# Patient Record
Sex: Female | Born: 1940 | Race: Black or African American | Hispanic: No | Marital: Married | Smoking: Never smoker
Health system: Southern US, Community
[De-identification: ages and names within clinical notes are randomized; demographics above are authoritative.]

## PROBLEM LIST (undated history)

## (undated) DIAGNOSIS — T4145XA Adverse effect of unspecified anesthetic, initial encounter: Secondary | ICD-10-CM

## (undated) DIAGNOSIS — K589 Irritable bowel syndrome without diarrhea: Secondary | ICD-10-CM

## (undated) DIAGNOSIS — Z860100 Personal history of colon polyps, unspecified: Secondary | ICD-10-CM

## (undated) DIAGNOSIS — R9401 Abnormal electroencephalogram [EEG]: Secondary | ICD-10-CM

## (undated) DIAGNOSIS — R519 Headache, unspecified: Secondary | ICD-10-CM

## (undated) DIAGNOSIS — G459 Transient cerebral ischemic attack, unspecified: Secondary | ICD-10-CM

## (undated) DIAGNOSIS — E785 Hyperlipidemia, unspecified: Secondary | ICD-10-CM

## (undated) DIAGNOSIS — F411 Generalized anxiety disorder: Secondary | ICD-10-CM

## (undated) DIAGNOSIS — L509 Urticaria, unspecified: Secondary | ICD-10-CM

## (undated) DIAGNOSIS — J309 Allergic rhinitis, unspecified: Secondary | ICD-10-CM

## (undated) DIAGNOSIS — M810 Age-related osteoporosis without current pathological fracture: Secondary | ICD-10-CM

## (undated) DIAGNOSIS — E559 Vitamin D deficiency, unspecified: Secondary | ICD-10-CM

## (undated) DIAGNOSIS — H35039 Hypertensive retinopathy, unspecified eye: Secondary | ICD-10-CM

## (undated) DIAGNOSIS — T8859XA Other complications of anesthesia, initial encounter: Secondary | ICD-10-CM

## (undated) DIAGNOSIS — L309 Dermatitis, unspecified: Secondary | ICD-10-CM

## (undated) DIAGNOSIS — M1990 Unspecified osteoarthritis, unspecified site: Secondary | ICD-10-CM

## (undated) DIAGNOSIS — I700 Atherosclerosis of aorta: Secondary | ICD-10-CM

## (undated) DIAGNOSIS — J45909 Unspecified asthma, uncomplicated: Secondary | ICD-10-CM

## (undated) DIAGNOSIS — I10 Essential (primary) hypertension: Secondary | ICD-10-CM

## (undated) DIAGNOSIS — C4491 Basal cell carcinoma of skin, unspecified: Secondary | ICD-10-CM

## (undated) DIAGNOSIS — R51 Headache: Secondary | ICD-10-CM

## (undated) DIAGNOSIS — G629 Polyneuropathy, unspecified: Secondary | ICD-10-CM

## (undated) DIAGNOSIS — E119 Type 2 diabetes mellitus without complications: Secondary | ICD-10-CM

## (undated) DIAGNOSIS — J42 Unspecified chronic bronchitis: Secondary | ICD-10-CM

## (undated) DIAGNOSIS — I6529 Occlusion and stenosis of unspecified carotid artery: Secondary | ICD-10-CM

## (undated) DIAGNOSIS — K5900 Constipation, unspecified: Secondary | ICD-10-CM

## (undated) DIAGNOSIS — K219 Gastro-esophageal reflux disease without esophagitis: Secondary | ICD-10-CM

## (undated) HISTORY — DX: Generalized anxiety disorder: F41.1

## (undated) HISTORY — DX: Personal history of colon polyps, unspecified: Z86.0100

## (undated) HISTORY — DX: Hyperlipidemia, unspecified: E78.5

## (undated) HISTORY — DX: Unspecified chronic bronchitis: J42

## (undated) HISTORY — DX: Hypertensive retinopathy, unspecified eye: H35.039

## (undated) HISTORY — DX: Irritable bowel syndrome, unspecified: K58.9

## (undated) HISTORY — DX: Abnormal electroencephalogram (EEG): R94.01

## (undated) HISTORY — DX: Constipation, unspecified: K59.00

## (undated) HISTORY — DX: Unspecified asthma, uncomplicated: J45.909

## (undated) HISTORY — DX: Personal history of colonic polyps: Z86.010

## (undated) HISTORY — DX: Dermatitis, unspecified: L30.9

## (undated) HISTORY — DX: Basal cell carcinoma of skin, unspecified: C44.91

## (undated) HISTORY — DX: Unspecified osteoarthritis, unspecified site: M19.90

## (undated) HISTORY — DX: Vitamin D deficiency, unspecified: E55.9

## (undated) HISTORY — DX: Atherosclerosis of aorta: I70.0

## (undated) HISTORY — DX: Occlusion and stenosis of unspecified carotid artery: I65.29

## (undated) HISTORY — DX: Polyneuropathy, unspecified: G62.9

## (undated) HISTORY — PX: CATARACT EXTRACTION W/ INTRAOCULAR LENS  IMPLANT, BILATERAL: SHX1307

## (undated) HISTORY — DX: Allergic rhinitis, unspecified: J30.9

## (undated) HISTORY — DX: Age-related osteoporosis without current pathological fracture: M81.0

## (undated) HISTORY — DX: Urticaria, unspecified: L50.9

---

## 1973-06-10 HISTORY — PX: TUBAL LIGATION: SHX77

## 1999-01-11 ENCOUNTER — Emergency Department (HOSPITAL_COMMUNITY): Admission: EM | Admit: 1999-01-11 | Discharge: 1999-01-11 | Payer: Self-pay | Admitting: Emergency Medicine

## 1999-01-11 ENCOUNTER — Encounter: Payer: Self-pay | Admitting: Emergency Medicine

## 1999-04-03 ENCOUNTER — Encounter: Payer: Self-pay | Admitting: Family Medicine

## 1999-04-03 ENCOUNTER — Encounter: Admission: RE | Admit: 1999-04-03 | Discharge: 1999-04-03 | Payer: Self-pay | Admitting: Family Medicine

## 1999-04-12 ENCOUNTER — Encounter: Admission: RE | Admit: 1999-04-12 | Discharge: 1999-04-12 | Payer: Self-pay | Admitting: Family Medicine

## 1999-04-12 ENCOUNTER — Encounter: Payer: Self-pay | Admitting: Family Medicine

## 1999-04-25 ENCOUNTER — Encounter: Admission: RE | Admit: 1999-04-25 | Discharge: 1999-04-25 | Payer: Self-pay | Admitting: Family Medicine

## 1999-04-25 ENCOUNTER — Encounter: Payer: Self-pay | Admitting: Family Medicine

## 1999-08-13 ENCOUNTER — Other Ambulatory Visit: Admission: RE | Admit: 1999-08-13 | Discharge: 1999-08-13 | Payer: Self-pay | Admitting: Obstetrics and Gynecology

## 1999-08-20 ENCOUNTER — Encounter: Payer: Self-pay | Admitting: Obstetrics and Gynecology

## 1999-08-20 ENCOUNTER — Encounter: Admission: RE | Admit: 1999-08-20 | Discharge: 1999-08-20 | Payer: Self-pay | Admitting: Obstetrics and Gynecology

## 1999-10-31 ENCOUNTER — Encounter: Payer: Self-pay | Admitting: Emergency Medicine

## 1999-10-31 ENCOUNTER — Emergency Department (HOSPITAL_COMMUNITY): Admission: EM | Admit: 1999-10-31 | Discharge: 1999-10-31 | Payer: Self-pay | Admitting: Emergency Medicine

## 1999-11-28 ENCOUNTER — Encounter: Admission: RE | Admit: 1999-11-28 | Discharge: 2000-02-26 | Payer: Self-pay | Admitting: Family Medicine

## 1999-12-18 ENCOUNTER — Encounter: Admission: RE | Admit: 1999-12-18 | Discharge: 1999-12-18 | Payer: Self-pay | Admitting: Gastroenterology

## 1999-12-18 ENCOUNTER — Encounter: Payer: Self-pay | Admitting: Gastroenterology

## 2000-02-16 ENCOUNTER — Emergency Department (HOSPITAL_COMMUNITY): Admission: EM | Admit: 2000-02-16 | Discharge: 2000-02-16 | Payer: Self-pay | Admitting: Emergency Medicine

## 2000-02-17 ENCOUNTER — Encounter: Payer: Self-pay | Admitting: Emergency Medicine

## 2000-05-07 ENCOUNTER — Encounter: Admission: RE | Admit: 2000-05-07 | Discharge: 2000-05-07 | Payer: Self-pay | Admitting: Family Medicine

## 2000-05-07 ENCOUNTER — Encounter: Payer: Self-pay | Admitting: Family Medicine

## 2000-06-26 ENCOUNTER — Encounter: Admission: RE | Admit: 2000-06-26 | Discharge: 2000-09-24 | Payer: Self-pay | Admitting: Family Medicine

## 2000-10-03 ENCOUNTER — Encounter: Admission: RE | Admit: 2000-10-03 | Discharge: 2001-01-01 | Payer: Self-pay | Admitting: Family Medicine

## 2002-12-16 ENCOUNTER — Encounter: Admission: RE | Admit: 2002-12-16 | Discharge: 2002-12-16 | Payer: Self-pay | Admitting: Family Medicine

## 2002-12-16 ENCOUNTER — Encounter: Payer: Self-pay | Admitting: Family Medicine

## 2003-05-19 ENCOUNTER — Encounter: Admission: RE | Admit: 2003-05-19 | Discharge: 2003-05-19 | Payer: Self-pay | Admitting: Family Medicine

## 2003-05-31 ENCOUNTER — Encounter: Admission: RE | Admit: 2003-05-31 | Discharge: 2003-05-31 | Payer: Self-pay | Admitting: Family Medicine

## 2004-03-01 ENCOUNTER — Other Ambulatory Visit: Admission: RE | Admit: 2004-03-01 | Discharge: 2004-03-01 | Payer: Self-pay | Admitting: Family Medicine

## 2004-03-07 ENCOUNTER — Inpatient Hospital Stay (HOSPITAL_COMMUNITY): Admission: EM | Admit: 2004-03-07 | Discharge: 2004-03-10 | Payer: Self-pay | Admitting: Emergency Medicine

## 2004-03-08 ENCOUNTER — Encounter (INDEPENDENT_AMBULATORY_CARE_PROVIDER_SITE_OTHER): Payer: Self-pay | Admitting: *Deleted

## 2004-03-16 ENCOUNTER — Encounter: Admission: RE | Admit: 2004-03-16 | Discharge: 2004-03-16 | Payer: Self-pay | Admitting: Family Medicine

## 2004-04-16 ENCOUNTER — Ambulatory Visit (HOSPITAL_COMMUNITY): Admission: RE | Admit: 2004-04-16 | Discharge: 2004-04-16 | Payer: Self-pay | Admitting: Family Medicine

## 2004-04-17 ENCOUNTER — Ambulatory Visit (HOSPITAL_COMMUNITY): Admission: RE | Admit: 2004-04-17 | Discharge: 2004-04-17 | Payer: Self-pay | Admitting: Family Medicine

## 2004-04-27 ENCOUNTER — Encounter: Admission: RE | Admit: 2004-04-27 | Discharge: 2004-04-27 | Payer: Self-pay | Admitting: Sports Medicine

## 2005-02-08 ENCOUNTER — Ambulatory Visit (HOSPITAL_COMMUNITY): Admission: RE | Admit: 2005-02-08 | Discharge: 2005-02-08 | Payer: Self-pay | Admitting: Internal Medicine

## 2005-06-19 ENCOUNTER — Ambulatory Visit (HOSPITAL_COMMUNITY): Admission: RE | Admit: 2005-06-19 | Discharge: 2005-06-19 | Payer: Self-pay | Admitting: Internal Medicine

## 2005-08-27 ENCOUNTER — Other Ambulatory Visit: Admission: RE | Admit: 2005-08-27 | Discharge: 2005-08-27 | Payer: Self-pay | Admitting: Obstetrics and Gynecology

## 2006-12-03 ENCOUNTER — Encounter: Admission: RE | Admit: 2006-12-03 | Discharge: 2006-12-03 | Payer: Self-pay | Admitting: Internal Medicine

## 2007-01-31 ENCOUNTER — Emergency Department (HOSPITAL_COMMUNITY): Admission: EM | Admit: 2007-01-31 | Discharge: 2007-01-31 | Payer: Self-pay | Admitting: Emergency Medicine

## 2007-03-03 ENCOUNTER — Emergency Department (HOSPITAL_COMMUNITY): Admission: EM | Admit: 2007-03-03 | Discharge: 2007-03-03 | Payer: Self-pay | Admitting: Emergency Medicine

## 2007-04-16 ENCOUNTER — Ambulatory Visit (HOSPITAL_COMMUNITY): Admission: RE | Admit: 2007-04-16 | Discharge: 2007-04-16 | Payer: Self-pay | Admitting: Internal Medicine

## 2007-07-04 ENCOUNTER — Encounter: Admission: RE | Admit: 2007-07-04 | Discharge: 2007-07-04 | Payer: Self-pay | Admitting: Neurology

## 2007-07-30 ENCOUNTER — Ambulatory Visit: Payer: Self-pay | Admitting: Internal Medicine

## 2007-07-30 DIAGNOSIS — R0602 Shortness of breath: Secondary | ICD-10-CM

## 2007-07-30 DIAGNOSIS — R05 Cough: Secondary | ICD-10-CM

## 2007-07-30 DIAGNOSIS — R059 Cough, unspecified: Secondary | ICD-10-CM | POA: Insufficient documentation

## 2007-07-30 HISTORY — DX: Shortness of breath: R06.02

## 2007-08-18 ENCOUNTER — Ambulatory Visit: Payer: Self-pay | Admitting: Internal Medicine

## 2007-09-17 ENCOUNTER — Ambulatory Visit (HOSPITAL_COMMUNITY): Admission: RE | Admit: 2007-09-17 | Discharge: 2007-09-17 | Payer: Self-pay | Admitting: Internal Medicine

## 2007-09-17 ENCOUNTER — Encounter: Payer: Self-pay | Admitting: Internal Medicine

## 2007-10-14 ENCOUNTER — Telehealth: Payer: Self-pay | Admitting: Internal Medicine

## 2007-11-16 ENCOUNTER — Ambulatory Visit: Payer: Self-pay

## 2007-11-16 ENCOUNTER — Encounter: Payer: Self-pay | Admitting: Internal Medicine

## 2007-12-04 ENCOUNTER — Ambulatory Visit: Payer: Self-pay | Admitting: Internal Medicine

## 2008-05-26 ENCOUNTER — Encounter: Admission: RE | Admit: 2008-05-26 | Discharge: 2008-05-26 | Payer: Self-pay | Admitting: Internal Medicine

## 2008-12-16 ENCOUNTER — Ambulatory Visit (HOSPITAL_COMMUNITY): Admission: RE | Admit: 2008-12-16 | Discharge: 2008-12-16 | Payer: Self-pay | Admitting: Internal Medicine

## 2009-09-04 ENCOUNTER — Encounter: Admission: RE | Admit: 2009-09-04 | Discharge: 2009-09-04 | Payer: Self-pay | Admitting: Internal Medicine

## 2010-10-26 NOTE — Discharge Summary (Signed)
Sara Brown, Sara Brown NO.:  [id number]   MEDICAL RECORD NO.:  [id number]          PATIENT TYPE:  INP   LOCATION:  3738                         FACILITY:  [facility]   PHYSICIAN:  Elliot Cousin, M.D.    DATE OF BIRTH:  Mar 26, 1941   DATE OF ADMISSION:  03/07/2004  DATE OF DISCHARGE:  03/10/2004                                 DISCHARGE SUMMARY   DISCHARGE DIAGNOSES:  1.  Right-sided numbness/paresthesias.  2.  Hypertension.  3.  Hypokalemia.   SECONDARY DISCHARGE DIAGNOSES:  1.  Gastroesophageal reflux disease.  2.  Hiatal hernia.  3.  Chronic asthma.  4.  Hyperlipidemia.  5.  History of acute bronchitis infections.   DISCHARGE MEDICATIONS:  1.  Neurontin 100 mg b.i.d. to t.i.d.  2.  Plavix 75 mg daily.  3.  Aspirin 325 mg daily.  (Take in substitution for Plavix if Plavix is too      expensive.)  4.  Potassium chloride 20 mEq 3 times daily.  5.  Maxzide 75 mg/50 mg 1/2 tablet daily.  6.  Allegra 180 mg daily.  7.  Norvasc 10 mg daily.  8.  Flonase 1 spray in each nostril b.i.d.  9.  Flomax as previously directed.  10. Advair 250/50 b.i.d.  11. Zocor 10 mg daily.  12. Prevacid 30 mg daily.  13. Tylenol Arthritis p.r.n.   DISPOSITION:  The patient was discharged to home in improved and stable  condition.  She was advised to follow up with her primary care physician,  Dr. Smith Mince, in five days.  The patient will need to have potassium  rechecked.   CONSULTATIONS:  Marlan Palau, M.D. and colleagues.   PROCEDURES:  1.  CT scan of the head without contrast on March 07, 2004.  The result      was negative.  2.  MRI of the brain on March 07, 2004.  No acute intracranial      abnormality.  3.  MRA of the head and neck:  No significant proximal stenosis or      intracranial atherosclerotic change.  4.  MRI of the neck on March 09, 2004.  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  5.  A 2-D echocardiogram on March 04, 2004 with results revealing      ejection fraction estimated at 55 to 65%.  No left ventricular regional      wall motion abnormalities.  Left ventricular wall thickness was mildly      increase, mild abnormality of left ventricular relaxation, mild      tricuspid regurgitation.  6.  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  No significant left ICA stenosis.      Vertebral artery flow antegrade.   HISTORY OF PRESENT ILLNESS:  The patient is a 70 year old African-American  lady with past medical history significant for hypertension,  gastroesophageal reflux disease, and hyperlipidemia, who presented to the  emergency department on March 07, 2004, with a chief complaint of right-  sided numbness.  The patient complained of numbness on the right side of her  head which tended to radiate down to her right hand.  She also noted some  intermittent weakness.  She had difficulty using her right hand on the day  of admission.  She is right handed.  She denied any focal weakness or  numbness in her right leg.  When the patient was evaluated in the emergency  department, a CT scan of the head revealed no acute disease.  On exam by Dr.  Robb Matar, the patient had no focal neurological findings.  The patient,  however, was admitted for further evaluation and management of presumed  diagnosis of TIA.   HOSPITAL COURSE:  #1.  RIGHT-SIDED NUMBNESS, PARESTHESIAS:  As stated above, CT scan of the  head without contrast was negative for any type of intracranial abnormality  acutely.  The patient was hemodynamically stable on admission. On initial  exam, there was no indication of a loss of sensation.  The patient was  started on treatment empirically with an aspirin 325 mg a day and Plavix 75  mg daily.  She was also treated with prophylactic Lovenox 40 mg   subcutaneously daily.  For further evaluation, additional investigational  studies were ordered including an MRI of the brain, carotid Dopplers, and a  2-D echocardiogram.  The MRI of the brain revealed no evidence of acute  intracranial abnormality.  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  The 2-D  echocardiogram revealed that the patient had good LV function, ejection  fraction ranging between 55 to 65%.  No obvious evidence of valve thrombus.  Dr. Anne Hahn, neurologist, was subsequently consulted.  Dr. Anne Hahn recommended  ordering an MRA of brain and neck to rule out critical intracranial and  extracranial vessel stenosis.  He agreed with antiplatelet therapy with  Plavix and aspirin.  Dr. Anne Hahn felt that the patient probably did not have  a TIA, but it was still considered in the differential.  In addition to a  TIA, the possibility of a migraine equivalent or a possible seizure were  also considered in the differential diagnoses but less likely.   The MRA of the brain revealed no significant proximal stenosis or  intracranial atherosclerotic change.  The MRA of the neck revealed advanced  dolichoectasia of the craniocervical vessel without significant stenosis.  Further labs were ordered following the essentially negative results from  the MRI and MRA.  These included a sed rate, which was within normal limits  at 19; TSH, which was within normal limits at 1.27; and a vitamin B12, which  was 1694.  RPR and folic acid levels are still pending.  The patient was  started on Neurontin 100 mg t.i.d. by the neurology team.  She will be  discharged home as well on Plavix 75 mg daily; however, the patient was  advised to take an aspirin 325 mg daily if the Plavix was too expensive.  The patient now has minimal complaints of right-sided numbness.  She  demonstrated no focal neurological deficits during the hospitalization.   She had no complaints of headache or dizziness prior to hospital discharge.   #2.  HYPOKALEMIA:  The patient's potassium on admission was 3.4.  The  Maxzide dose was reduced to 1/2 pill a day, and the patient was treated  accordingly  with potassium chloride 3 times daily.  The patient blood  pressure was well within normal limits during hospital course.  At the time  of hospital discharge, potassium was 3.7.  The patient was advised to  continue Maxzide 75/50 one-half tablet daily along with potassium chloride  20 mEq t.i.d. until she follows up with her primary care physician, Dr.  Smith Mince, in one week.  A basic metabolic panel should be ordered for  reassessment.   #3.  HYPERLIPIDEMIA:  The patient's fasting lipid panel results are as  follows, total cholesterol 176, triglycerides 159, HDL 47, and LDL 97.  The  patient was maintained on Zocor 10 mg daily.   LABORATORY DATA:  Cardiac enzymes and hemoglobin A1C were assessed during  the hospital course.  The cardiac enzymes were negative.  The patient's  hemoglobin A1C was 6.3.   Discharge laboratories:  Sed rate 19.  TSH 1.273. Vitamin B12 1694.  Sodium  141, potassium 3.7, chloride 106, CO2 29, glucose 101, BUN 10, creatinine  0.9, calcium 9.4, magnesium 2.0.   Pending:  RPR, folate, ANA.       DF/MEDQ  D:  03/10/2004  T:  03/11/2004  Job:  [id number]   cc:   Talmadge Coventry, M.D.  [address]  [place]  [place] [address]  Fax: [phone]   C. Lesia Sago, M.D.  [address]  [address]  [place]  [place] [address]  Fax: [phone]

## 2010-10-26 NOTE — H&P (Signed)
Sara Brown, Sara Brown             ACCOUNT NO.:  [id number]   MEDICAL RECORD NO.:  [id number]          PATIENT TYPE:  INP   LOCATION:  1828                         FACILITY:  [facility]   PHYSICIAN:  Jonna L. Robb Matar, M.D.DATE OF BIRTH:  19-Dec-1940   DATE OF ADMISSION:  03/07/2004  DATE OF DISCHARGE:                                HISTORY & PHYSICAL   PRIMARY CARE PHYSICIAN:  Mazzocchi, M.D.   CHIEF COMPLAINT:  Right-sided numbness.   HISTORY:  For the past week, this 70 year old African-American female has  had some episodes of slight numbness in her right hand, but they cleared up  fairly quickly.  Today around noon, however, she got a significant amount of  numbness, could not even use her hand.  She noticed also that it was weak.  She could barely lift it up. She could not work on her computer any more.  She started to get numbness and a peculiar sensation on the right side of  her face.  She is right handed. She says that yesterday and today, both legs  felt kind of weak and tired and draggy, but no specific muscle weakness in  her right leg similar to her right arm.   PAST MEDICAL HISTORY:  1.  Hypertension. She had recently been taking off Maxzide, but today when      she started to get this sensation, she decided to take on at home and      also took 2 baby aspirin as well.  2.  GERD and hiatal hernia.  3.  Chronic asthma, diagnosed about five years ago.  4.  Hypercholesterolemia for about two years.  5.  Frequent bronchitis. When she had been on the aspirin medication, she      noticed that tended to clear up.   ALLERGIES:  PREDNISONE and NAPROSYN tend to give her indigestion.   MEDICATIONS:  1.  Maxzide 75/50.  2.  Potassium.  3.  Allegra.  4.  Norvasc 10 daily.  5.  Flonase.  6.  Fosamax.  7.  Advair 250/50 b.i.d.  8.  Zocor 10.  9.  Prevacid.  10. Tylenol arthritis p.r.n.   OPERATIONS:  Two C sections.   SOCIAL HISTORY:  Nonsmoker, nondrinker. 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  She is married, two  sons.   FAMILY HISTORY:  Her mother and father both are diabetic.  Her mother had  hypertension and congestive heart failure as well.   REVIEW OF SYSTEMS:  No fever or chills.  No weight loss or anorexia.  No  history of diabetes or thyroid problems.  No eye problems.  She has had  chronic congestion and sinus congestion which did improve with the Flonase.  She has not been told of emphysema or pneumonia in the past.  She denies any  history of heart attack or abnormal EKG.  No liver or kidney problems.  She  has had eczema for many years.  She recently also started to get roughening  of the skin which they thought might be  due to the Maxzide, and that was one  of the reasons it was stopped.  She has had no major gynecology problems.  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  She recently had a physical.  She has been told of normal Pap  smears all along.  Her last mammogram was about a year ago, and that was  normal.  She was told of a touch of diabetes when she was pregnant.  As far  as she knows, she does not have it now. No previous neurologic problems like  seizures or migraines.  No psychiatric problems.  No unusual bleeding or  bruising or previous clotting.   PHYSICAL EXAMINATION:  VITAL SIGNS:  Temperature 98.1, pulse 62,  respirations 20, blood pressure 147/83 and 98% on room air.  GENERAL:  Well-nourished, well-developed African-American female sitting in  bed eating dinner.  HEENT:  Normocephalic and atraumatic.  Extraocular movements are full.  Conjunctivae are pink.  Oropharyngeal mucosa normal.  NECK:  No thyromegaly, carotid bruit, or jugular venous distention.  BREASTS:  Without mass, tenderness, or discharge.  RESPIRATORY  Normal respiratory effort.  Lungs clear to auscultation and  percussion without wheeze, rales,  rhonchi, or dullness.  HEART: Regular rate and rhythm.  Heart is normal S1 and S2 without murmurs,  rubs, or gallops.  There was no cyanosis or clubbing, but she does have  trace peripheral edema.  ABDOMEN:  Soft, nontender.  No hepatosplenomegaly.  Normal bowel sounds.  She has a lower abdominal scar, no hernias.  EXTERNAL GENITALIA:  Normal.  EXTREMITIES:  No femoral bruits.  SKIN:  Areas of hyperpigmentation and hypopigmentation, especially around  the antecubital areas.  There are mild osteoarthritic changes in the hands.  NEUROLOGIC:  The patient is alert and oriented x 3, normal memory, judgment,  and affect.  Full range of motion in all four extremities, 5/5 all four  extremities as far as weakness.  There is no decrease in sensation.   LABORATORY AND X-RAY DATA:  CBC normal.  INR normal.  Potassium 3.4.  LFTs  are normal.   EKG shows poor R wave progression, suggestive she might have had an old  anteroseptal MI.   IMPRESSION:  1.  Transient ischemic attack.  There are no neurological findings at this      time.  I am going to work her up for external sources and continue her      on aspirin and Plavix.  2.  Hypokalemia.  Replace p.o.  3.  Hypertension.  Continue Norvasc and low-dose Maxzide.  4.  Hypercholesterolemia.  Continue Zocor and check her fasting lipids.  5.  Gastroesophageal reflux disease. Continue PPI.  6.  Chronic asthma.  Continue her regular medication.,  7.  Cerumen impaction. Will treat this with some Cerumenex.  8.  Abnormal electrocardiogram.  I will check an echocardiogram both to look      at the valves and also to see about the anterior wall motion.       JLB/MEDQ  D:  03/07/2004  T:  03/07/2004  Job:  [id number]   cc:   Talmadge Coventry, M.D.  [address]  [place]  [place] [address]  Fax: [phone]

## 2010-10-26 NOTE — Consult Note (Signed)
Sara Brown NO.:  [id number]   MEDICAL RECORD NO.:  [id number]          PATIENT TYPE:  INP   LOCATION:  3738                         FACILITY:  [facility]   PHYSICIAN:  Marlan Palau, M.D.  DATE OF BIRTH:  08/23/1940   DATE OF CONSULTATION:  03/09/2004  DATE OF DISCHARGE:                                   CONSULTATION   HISTORY OF PRESENT ILLNESS:  Sara Brown is a 70 year old black female  born 1940-08-06, with a history of hypertension and  hypercholesterolemia.  This patient presents with a three-day history of  intermittent episodes of right facial numbness and tingling, some tingling  involving the hand, and occasionally the right leg as well.  The patient  reports some problems with coordination and clumsiness of the right arm and  leg with some of these episodes.  The longest episodes have been about an  hour, most are much shorter than this.  The patient has relative resolution  of the events, but even at this time still feels like the right side has  some decreased sensation.  The patient denies any visual field changes, had  some slowness of speech, but no true aphasia problems, and again denied any  significant visual field changes.  The patient denies headache associated  with this, has had headache off and on during her life, but does not recall  having what she would call migraine events.  The patient does have a history  of some episodes of right facial numbness that date back as much as 20  years.  The patient had brief events off and on over that period of time.  The patient, during this admission, has had an MRI scan of the brain that  shows no evidence of an acute stroke.  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  The patient is on aspirin and Plavix and  Neurology is asked to see the patient for further evaluation.   PAST MEDICAL HISTORY:  1.  Hypertension.  2.  Gastroesophageal reflux disease.  3.  Asthma.  4.  Hypercholesterolemia.  5.  Episodes of right-sided numbness as above, rule out TIA.  6.  History of C-section x2 in the past.   CURRENT MEDICATIONS:  1.  Lovenox 40 mg q.h.s.  2.  Flonase 1 spray daily.  3.  Advair 250/50 b.i.d.  4.  Potassium supplementation 20 mEq t.i.d.  5.  Norvasc 10 mg daily.  6.  Aspirin 325 mg daily.  7.  Plavix 75 mg daily.  8.  Protonix 40 mg daily.  9.  Diazide 37.5/25 mg tablets, 1/2 tablet daily.  10. Zocor 10 mg daily.  11. Claritin 10 mg daily.  12. Tylenol if needed.  13. Phenergan if needed.  14. Senokot if needed.   The patient does not smoke or drink.  THE PATIENT HAS ALLERGY OR  INTOLERANCE  TO NAPROSYN.  THE PATIENT IS ALSO INTOLERANT TO PREDNISONE.   FAMILY MEDICAL HISTORY:  Mother died at age 71 with congestive heart  failure, diabetes, had a stroke in her 40s.  Uncle with a stroke.  Father  died at age 36 with diabetes.  Again, this patient is married, lives in the  [place] area, and is not employed.   PHYSICAL EXAMINATION:  VITAL SIGNS:  Blood pressure 132/80, heart rate 68,  respiratory rate 18, temperature afebrile.  GENERAL:  This patient is a minimally obese black female who is alert and  cooperative at the time of the examination.  HEENT:  Head is atraumatic.  Eyes: Pupils equal, round, react to light 4  millimeters, discs are flat.  NECK:  Supple.  No carotid bruits noted.  RESPIRATORY:  Examination is clear.  CARDIOVASCULAR:  Examination reveals a regular rate and rhythm, no obvious  murmurs or rubs noted.  EXTREMITIES:  Without significant edema.  NEUROLOGIC:  Cranial nerves as above.  Facial asymmetry is present.  The  patient notes no clear facial asymmetry to pinprick sensation or  vibratory  sensation on the forehead.  The patient has full extraocular movements,  visual fields are full to double simultaneous stimulation.  Speech is well-  enunciated, not aphasic.  The patient again has good strength of facial  muscles and the muscles with head turning and shoulder shrugs bilaterally.  Protrudes the tongue in midline, has good tongue strength.  Motor testing  reveals 5/5 strength in all fours.  Good symmetric bulk, tone, and  ___________ throughout.  Sensory testing reveals some minimal decrease in  pinprick sensation on the right arm, the right leg as compared to the left,  vibratory sensation is more symmetric throughout.  The patient has fair  finger-nose-finger, heel-to-shin.  Gait is relatively unremarkable.  The  patient has a slightly unsteady tandem gait.  Romberg negative.  No evidence  of pronator drift is seen.  Deep tendon reflexes are symmetric and normal,  toes are neutral to downgoing bilaterally.   LABORATORY VALUES:  Notable for a sodium of 137, potassium 3.4, chloride  102, CO2 28, glucose 85, BUN 11, creatinine 0.9, calcium 9.5, white count of  4.8, hemoglobin 12.3, hematocrit 36.4, MCV 85.0, platelets 253,000, calcium  9.3, total protein 7.7, albumin 4.1, AST 25, ALT 22, alkaline phosphatase  50, total bilirubin 0.9, INR 1.1.  Cholesterol panel reveals total  cholesterol 176, triglyceride 159, HDL 47, VLDL 32, LDL 97, hemoglobin A1c  6.3.   EKG reveals normal sinus bradycardia, possible anterior infarct, age  undetermine4d, heart rate of 56.   MRI scan of the head is as above.   IMPRESSION:  1.  Episodes of right face, arm, leg numbness, rule out transient ischemic      attack events.  2.  Hypertension.  3.  Hypercholesterolemia.   This patient has an objectively normal examination.  The patient reports of  right facial numbness off and on for 20 years, suggests that the current events may be more benign.  However, full cerebrovascular  circulation has  not yet been evaluated, we will need to look at intracranial circulation  origin of carotids and vertebrals in the chest.  We will pursue further  workup at this point.  The patient is on antiplatelet agents at this time.   PLAN:  1.  MR angiogram of the intracranial and extracranial vessels.  2.  Continue antiplatelet agent for now.  The patient may be  discharged on      antiplatelet agents if the above workup      is unremarkable.  If the MR angiogram is negative, okay to discharge      this patient from a neurologic standpoint.  The possibility of a      migraine equivalent or even _________ seizures does need to be      considered, but I think it is less likely.       CKW/MEDQ  D:  03/09/2004  T:  03/10/2004  Job:  [id number]   cc:   Miachel Roux L. Robb Matar, M.D.   Talmadge Coventry, M.D.  [address]  [place]  [place] [address]  Fax: [phone]

## 2011-01-09 ENCOUNTER — Other Ambulatory Visit (HOSPITAL_COMMUNITY): Payer: Self-pay | Admitting: Internal Medicine

## 2011-01-09 DIAGNOSIS — Z1231 Encounter for screening mammogram for malignant neoplasm of breast: Secondary | ICD-10-CM

## 2011-01-18 ENCOUNTER — Ambulatory Visit (HOSPITAL_COMMUNITY)
Admission: RE | Admit: 2011-01-18 | Discharge: 2011-01-18 | Disposition: A | Discharge status: Home / Self Care | Payer: Medicare Other | Source: Ambulatory Visit | Attending: Internal Medicine | Admitting: Internal Medicine

## 2011-01-18 DIAGNOSIS — Z1231 Encounter for screening mammogram for malignant neoplasm of breast: Secondary | ICD-10-CM | POA: Insufficient documentation

## 2011-03-21 LAB — D-DIMER, QUANTITATIVE: D-Dimer, Quant: <0.22

## 2011-03-21 LAB — DIFFERENTIAL
Eosinophils Relative: 1
Lymphocytes Relative: 21
Lymphs Abs: 1.2
Monocytes Absolute: 0.5

## 2011-03-21 LAB — POCT I-STAT CREATININE: Operator id: 294501

## 2011-03-21 LAB — I-STAT 8, (EC8 V) (CONVERTED LAB)
Bicarbonate: 24.6 — ABNORMAL HIGH
Glucose, Bld: 128 — ABNORMAL HIGH
Hemoglobin: 14.6
Operator id: 294501
Sodium: 138
TCO2: 26
pCO2, Ven: 34.2 — ABNORMAL LOW

## 2011-03-21 LAB — APTT: aPTT: 21 — ABNORMAL LOW

## 2011-03-21 LAB — CBC
HCT: 37.5
Hemoglobin: 12.6
WBC: 5.6

## 2011-03-21 LAB — PROTIME-INR: INR: 1

## 2011-06-18 DIAGNOSIS — H2510 Age-related nuclear cataract, unspecified eye: Secondary | ICD-10-CM | POA: Diagnosis not present

## 2011-06-18 DIAGNOSIS — H538 Other visual disturbances: Secondary | ICD-10-CM | POA: Diagnosis not present

## 2011-06-18 DIAGNOSIS — H269 Unspecified cataract: Secondary | ICD-10-CM | POA: Diagnosis not present

## 2011-06-20 DIAGNOSIS — M79609 Pain in unspecified limb: Secondary | ICD-10-CM | POA: Diagnosis not present

## 2011-06-25 DIAGNOSIS — M79609 Pain in unspecified limb: Secondary | ICD-10-CM | POA: Diagnosis not present

## 2011-08-07 DIAGNOSIS — I10 Essential (primary) hypertension: Secondary | ICD-10-CM | POA: Diagnosis not present

## 2011-08-07 DIAGNOSIS — J45909 Unspecified asthma, uncomplicated: Secondary | ICD-10-CM | POA: Diagnosis not present

## 2011-08-07 DIAGNOSIS — R7309 Other abnormal glucose: Secondary | ICD-10-CM | POA: Diagnosis not present

## 2011-08-07 DIAGNOSIS — E785 Hyperlipidemia, unspecified: Secondary | ICD-10-CM | POA: Diagnosis not present

## 2011-10-03 DIAGNOSIS — R5381 Other malaise: Secondary | ICD-10-CM | POA: Diagnosis not present

## 2011-10-03 DIAGNOSIS — R5383 Other fatigue: Secondary | ICD-10-CM | POA: Diagnosis not present

## 2011-10-03 DIAGNOSIS — IMO0001 Reserved for inherently not codable concepts without codable children: Secondary | ICD-10-CM | POA: Diagnosis not present

## 2011-12-03 DIAGNOSIS — H16229 Keratoconjunctivitis sicca, not specified as Sjogren's, unspecified eye: Secondary | ICD-10-CM | POA: Diagnosis not present

## 2011-12-03 DIAGNOSIS — H40029 Open angle with borderline findings, high risk, unspecified eye: Secondary | ICD-10-CM | POA: Diagnosis not present

## 2011-12-03 DIAGNOSIS — H5710 Ocular pain, unspecified eye: Secondary | ICD-10-CM | POA: Diagnosis not present

## 2012-01-08 DIAGNOSIS — H43819 Vitreous degeneration, unspecified eye: Secondary | ICD-10-CM | POA: Diagnosis not present

## 2012-01-08 DIAGNOSIS — H04129 Dry eye syndrome of unspecified lacrimal gland: Secondary | ICD-10-CM | POA: Diagnosis not present

## 2012-01-08 DIAGNOSIS — H2510 Age-related nuclear cataract, unspecified eye: Secondary | ICD-10-CM | POA: Diagnosis not present

## 2012-01-08 DIAGNOSIS — Z961 Presence of intraocular lens: Secondary | ICD-10-CM | POA: Diagnosis not present

## 2012-01-08 DIAGNOSIS — H40029 Open angle with borderline findings, high risk, unspecified eye: Secondary | ICD-10-CM | POA: Diagnosis not present

## 2012-01-17 ENCOUNTER — Other Ambulatory Visit (HOSPITAL_COMMUNITY): Payer: Self-pay | Admitting: Internal Medicine

## 2012-01-17 DIAGNOSIS — Z1231 Encounter for screening mammogram for malignant neoplasm of breast: Secondary | ICD-10-CM

## 2012-02-05 ENCOUNTER — Ambulatory Visit (HOSPITAL_COMMUNITY)
Admission: RE | Admit: 2012-02-05 | Discharge: 2012-02-05 | Disposition: A | Discharge status: Home / Self Care | Payer: Medicare Other | Source: Ambulatory Visit | Attending: Internal Medicine | Admitting: Internal Medicine

## 2012-02-05 DIAGNOSIS — Z1231 Encounter for screening mammogram for malignant neoplasm of breast: Secondary | ICD-10-CM | POA: Insufficient documentation

## 2012-02-07 DIAGNOSIS — E785 Hyperlipidemia, unspecified: Secondary | ICD-10-CM | POA: Diagnosis not present

## 2012-02-07 DIAGNOSIS — R51 Headache: Secondary | ICD-10-CM | POA: Diagnosis not present

## 2012-02-07 DIAGNOSIS — I10 Essential (primary) hypertension: Secondary | ICD-10-CM | POA: Diagnosis not present

## 2012-02-07 DIAGNOSIS — E559 Vitamin D deficiency, unspecified: Secondary | ICD-10-CM | POA: Diagnosis not present

## 2012-02-13 DIAGNOSIS — H04129 Dry eye syndrome of unspecified lacrimal gland: Secondary | ICD-10-CM | POA: Diagnosis not present

## 2012-02-13 DIAGNOSIS — H40029 Open angle with borderline findings, high risk, unspecified eye: Secondary | ICD-10-CM | POA: Diagnosis not present

## 2012-02-21 DIAGNOSIS — J309 Allergic rhinitis, unspecified: Secondary | ICD-10-CM | POA: Diagnosis not present

## 2012-02-21 DIAGNOSIS — Z0000 Encounter for general adult medical examination without abnormal findings: Secondary | ICD-10-CM | POA: Diagnosis not present

## 2012-02-21 DIAGNOSIS — M79609 Pain in unspecified limb: Secondary | ICD-10-CM | POA: Diagnosis not present

## 2012-02-21 DIAGNOSIS — R7309 Other abnormal glucose: Secondary | ICD-10-CM | POA: Diagnosis not present

## 2012-02-21 DIAGNOSIS — Z23 Encounter for immunization: Secondary | ICD-10-CM | POA: Diagnosis not present

## 2012-02-26 DIAGNOSIS — M949 Disorder of cartilage, unspecified: Secondary | ICD-10-CM | POA: Diagnosis not present

## 2012-02-26 DIAGNOSIS — M899 Disorder of bone, unspecified: Secondary | ICD-10-CM | POA: Diagnosis not present

## 2012-05-12 ENCOUNTER — Other Ambulatory Visit: Payer: Self-pay | Admitting: Internal Medicine

## 2012-05-12 DIAGNOSIS — I10 Essential (primary) hypertension: Secondary | ICD-10-CM | POA: Diagnosis not present

## 2012-05-12 DIAGNOSIS — R1011 Right upper quadrant pain: Secondary | ICD-10-CM

## 2012-05-12 DIAGNOSIS — J309 Allergic rhinitis, unspecified: Secondary | ICD-10-CM | POA: Diagnosis not present

## 2012-05-15 ENCOUNTER — Ambulatory Visit
Admission: RE | Admit: 2012-05-15 | Discharge: 2012-05-15 | Disposition: A | Discharge status: Home / Self Care | Payer: Medicare Other | Source: Ambulatory Visit | Attending: Internal Medicine | Admitting: Internal Medicine

## 2012-05-15 DIAGNOSIS — R1011 Right upper quadrant pain: Secondary | ICD-10-CM

## 2012-05-15 DIAGNOSIS — I10 Essential (primary) hypertension: Secondary | ICD-10-CM | POA: Diagnosis not present

## 2012-08-25 DIAGNOSIS — M259 Joint disorder, unspecified: Secondary | ICD-10-CM | POA: Diagnosis not present

## 2012-09-01 DIAGNOSIS — M1710 Unilateral primary osteoarthritis, unspecified knee: Secondary | ICD-10-CM | POA: Diagnosis not present

## 2012-09-01 DIAGNOSIS — IMO0002 Reserved for concepts with insufficient information to code with codable children: Secondary | ICD-10-CM | POA: Diagnosis not present

## 2012-10-08 DIAGNOSIS — I10 Essential (primary) hypertension: Secondary | ICD-10-CM | POA: Diagnosis not present

## 2012-10-08 DIAGNOSIS — M79609 Pain in unspecified limb: Secondary | ICD-10-CM | POA: Diagnosis not present

## 2012-10-08 DIAGNOSIS — M1990 Unspecified osteoarthritis, unspecified site: Secondary | ICD-10-CM | POA: Diagnosis not present

## 2012-10-08 DIAGNOSIS — M25569 Pain in unspecified knee: Secondary | ICD-10-CM | POA: Diagnosis not present

## 2012-10-09 ENCOUNTER — Encounter (INDEPENDENT_AMBULATORY_CARE_PROVIDER_SITE_OTHER): Payer: Medicare Other | Admitting: *Deleted

## 2012-10-09 DIAGNOSIS — M79609 Pain in unspecified limb: Secondary | ICD-10-CM

## 2012-10-12 ENCOUNTER — Encounter: Payer: Self-pay | Admitting: Surgery

## 2012-10-19 ENCOUNTER — Encounter: Payer: Medicare Other | Attending: Internal Medicine | Admitting: *Deleted

## 2012-10-19 ENCOUNTER — Encounter: Payer: Self-pay | Admitting: *Deleted

## 2012-10-19 VITALS — Ht 67.0 in | Wt 159.7 lb

## 2012-10-19 DIAGNOSIS — Z713 Dietary counseling and surveillance: Secondary | ICD-10-CM | POA: Diagnosis not present

## 2012-10-19 DIAGNOSIS — E785 Hyperlipidemia, unspecified: Secondary | ICD-10-CM | POA: Diagnosis not present

## 2012-10-19 NOTE — Progress Notes (Signed)
Medical Nutrition Therapy:  Appt start time: 0915 end time:  1015.  Assessment:  Primary concern today: Hyperlipidemia. Patient reports a concern regarding her cholesterol levels. She is currently not on medication for this, but will be starting a new med in the next few weeks. She reports that she eats healthy, but only eats 2 meals daily, grazing throughout the afternoon. She is close to a healthy weight with a BMI of 25.1, but wants to lose 5-10 pounds, which may benefit cholesterol levels. Total chol: 271, TG: 183, LDL: 187, HDL: 47.   WEIGHT: 159.1 pounds BMI: 25.1  MEDICATIONS: Spironolactone, amlodipine, aspirin, Prevacid   DIETARY INTAKE:   Usual eating pattern includes 2 meals and >3 snacks per day.  24-hr recall:  B ( AM): Oatmeal with flax and soy milk/Cheerios  With soy milk, water, hot tea Snk ( AM): Ice cream, cookies, 1 sleeve Ritz crackers with almond butter, fruit L ( PM): None, grazes, see above Snk ( PM): Same D ( PM): Potatoes, chicken, greens (spinach/cabbage), sometimes dessert (ice cream) Snk ( PM): None Beverages: Water, hot tea  Usual physical activity: Yard work/gardening 1-2 times weekly  Estimated energy needs: 1500 calories 188 g carbohydrates 94 g protein 42 g fat  Progress Towards Goal(s):  In progress.   Nutritional Diagnosis:  [place]-2.2 Altered nutrition-related laboratory As related to dietary patterns.  As evidenced by chol: 271, TG: 183, LDL: 187.    Intervention:  Nutrition counseling. Provided examples on ways to decrease fat intake and choose healthier fats in the diet. Encouraged intake of fruits and vegetables as well as whole grain sources of carbohydrates to maximize fiber intake. We also discussed weight management and exercise as ways to reduce cholesterol.   Goals:  1. Eat three regular meals a day. Plan for no more than 3 healthy snacks.  2. Limit diet fat and choose healthier sources (olive/canola oil, Smart Balance, nuts).  3.  Monitor portion size of foods.  4. Read labels for total fat and types of fat.  5. Goal weight 150 pounds in 3 months.  6. Work up to exercising for 30 minutes at least 3 days weekly (including yard work).   Handouts given during visit include:  Heart Healthy Nutrition Therapy  Meal Planning Card  Monitoring/Evaluation:  Dietary intake, exercise, cholesterol, and body weight in 2 month(s).

## 2012-12-03 DIAGNOSIS — J309 Allergic rhinitis, unspecified: Secondary | ICD-10-CM | POA: Diagnosis not present

## 2012-12-03 DIAGNOSIS — L259 Unspecified contact dermatitis, unspecified cause: Secondary | ICD-10-CM | POA: Diagnosis not present

## 2012-12-03 DIAGNOSIS — L509 Urticaria, unspecified: Secondary | ICD-10-CM | POA: Diagnosis not present

## 2012-12-03 DIAGNOSIS — E785 Hyperlipidemia, unspecified: Secondary | ICD-10-CM | POA: Diagnosis not present

## 2012-12-10 DIAGNOSIS — H43819 Vitreous degeneration, unspecified eye: Secondary | ICD-10-CM | POA: Diagnosis not present

## 2012-12-10 DIAGNOSIS — Z961 Presence of intraocular lens: Secondary | ICD-10-CM | POA: Diagnosis not present

## 2012-12-10 DIAGNOSIS — H2510 Age-related nuclear cataract, unspecified eye: Secondary | ICD-10-CM | POA: Diagnosis not present

## 2012-12-10 DIAGNOSIS — H43399 Other vitreous opacities, unspecified eye: Secondary | ICD-10-CM | POA: Diagnosis not present

## 2012-12-10 DIAGNOSIS — H40029 Open angle with borderline findings, high risk, unspecified eye: Secondary | ICD-10-CM | POA: Diagnosis not present

## 2012-12-10 DIAGNOSIS — H04129 Dry eye syndrome of unspecified lacrimal gland: Secondary | ICD-10-CM | POA: Diagnosis not present

## 2012-12-10 DIAGNOSIS — H02409 Unspecified ptosis of unspecified eyelid: Secondary | ICD-10-CM | POA: Diagnosis not present

## 2012-12-21 ENCOUNTER — Encounter: Payer: Medicare Other | Attending: Internal Medicine | Admitting: *Deleted

## 2012-12-21 ENCOUNTER — Encounter: Payer: Self-pay | Admitting: *Deleted

## 2012-12-21 VITALS — Ht 67.0 in | Wt 152.0 lb

## 2012-12-21 DIAGNOSIS — E7800 Pure hypercholesterolemia, unspecified: Secondary | ICD-10-CM

## 2012-12-21 DIAGNOSIS — E785 Hyperlipidemia, unspecified: Secondary | ICD-10-CM | POA: Diagnosis not present

## 2012-12-21 DIAGNOSIS — Z713 Dietary counseling and surveillance: Secondary | ICD-10-CM | POA: Insufficient documentation

## 2012-12-21 NOTE — Progress Notes (Signed)
Medical Nutrition Therapy:  Appt start time: 0815 end time:  0830.  Assessment:  Patient returns today for a follow up visit. Patient has been making good efforts to eat a heart healthy diet. She reports monitoring portion size, eating 3 meals daily, monitoring types of fats in the diet and exercising. She bikes or walks about 30 minutes 2 days per week and will spend hours doing yard work about 1 day a week. She has lost 7 pounds (down from 159 pounds) in 2 months, which is just under 1 pound per week. The patient reports some concern that this weight loss is not healthy, but was reassured that this is healthy weight loss. Her goal weight is 150 pounds. No new lipid labs.  MEDICATIONS: Amlodipine, spironolactone, aspirin, Welchol, fish oil, prevacid, Centrum Silver, B6, Ocuvite  Usual physical activity: Biking/walking 30 minutes 2 days weekly, yard work 1 day [employer] energy needs: 1500 calories 188 g carbohydrates 94 g protein 42 g fat  Progress Towards Goal(s):  Some progress.   Nutritional Diagnosis:  [place]-2.2 Altered nutrition-related laboratory As related to dietary patterns.  As evidenced by Chol: 271, TG: 183, LDL: 187.    Intervention:  Nutrition counseling. I reinforced heart healthy eating with patient. She was advised to continue current eating habits until goal weight reached. We also discussed weight maintenance once goal weight reached.  Goals:  1. Goal weight 150 pounds in 2-4 weeks.  2. Continue to exercise at least 3 days a week for 30 minutes.  3. Continue to monitor portion size and types of fat in diet.  4. When goal weight reached, increase portions slightly or add additional healthy snack to maintain weight.   Monitoring/Evaluation:  Dietary intake, exercise, lipids, and body weight prn.

## 2012-12-25 DIAGNOSIS — Z1331 Encounter for screening for depression: Secondary | ICD-10-CM | POA: Diagnosis not present

## 2012-12-25 DIAGNOSIS — E785 Hyperlipidemia, unspecified: Secondary | ICD-10-CM | POA: Diagnosis not present

## 2012-12-25 DIAGNOSIS — K5900 Constipation, unspecified: Secondary | ICD-10-CM | POA: Diagnosis not present

## 2012-12-25 DIAGNOSIS — IMO0002 Reserved for concepts with insufficient information to code with codable children: Secondary | ICD-10-CM | POA: Diagnosis not present

## 2012-12-25 DIAGNOSIS — R109 Unspecified abdominal pain: Secondary | ICD-10-CM | POA: Diagnosis not present

## 2012-12-30 DIAGNOSIS — R109 Unspecified abdominal pain: Secondary | ICD-10-CM | POA: Diagnosis not present

## 2013-01-01 DIAGNOSIS — IMO0002 Reserved for concepts with insufficient information to code with codable children: Secondary | ICD-10-CM | POA: Diagnosis not present

## 2013-01-01 DIAGNOSIS — R109 Unspecified abdominal pain: Secondary | ICD-10-CM | POA: Diagnosis not present

## 2013-01-01 DIAGNOSIS — I10 Essential (primary) hypertension: Secondary | ICD-10-CM | POA: Diagnosis not present

## 2013-01-01 DIAGNOSIS — K5900 Constipation, unspecified: Secondary | ICD-10-CM | POA: Diagnosis not present

## 2013-01-04 DIAGNOSIS — K5900 Constipation, unspecified: Secondary | ICD-10-CM | POA: Diagnosis not present

## 2013-01-04 DIAGNOSIS — R109 Unspecified abdominal pain: Secondary | ICD-10-CM | POA: Diagnosis not present

## 2013-01-18 DIAGNOSIS — H6120 Impacted cerumen, unspecified ear: Secondary | ICD-10-CM | POA: Diagnosis not present

## 2013-01-18 DIAGNOSIS — J310 Chronic rhinitis: Secondary | ICD-10-CM | POA: Diagnosis not present

## 2013-01-18 DIAGNOSIS — J343 Hypertrophy of nasal turbinates: Secondary | ICD-10-CM | POA: Diagnosis not present

## 2013-01-18 DIAGNOSIS — H6980 Other specified disorders of Eustachian tube, unspecified ear: Secondary | ICD-10-CM | POA: Diagnosis not present

## 2013-01-18 DIAGNOSIS — H902 Conductive hearing loss, unspecified: Secondary | ICD-10-CM | POA: Diagnosis not present

## 2013-01-20 DIAGNOSIS — K5900 Constipation, unspecified: Secondary | ICD-10-CM | POA: Diagnosis not present

## 2013-02-02 DIAGNOSIS — L259 Unspecified contact dermatitis, unspecified cause: Secondary | ICD-10-CM | POA: Diagnosis not present

## 2013-02-12 DIAGNOSIS — Z961 Presence of intraocular lens: Secondary | ICD-10-CM | POA: Diagnosis not present

## 2013-02-12 DIAGNOSIS — H04129 Dry eye syndrome of unspecified lacrimal gland: Secondary | ICD-10-CM | POA: Diagnosis not present

## 2013-02-12 DIAGNOSIS — H40029 Open angle with borderline findings, high risk, unspecified eye: Secondary | ICD-10-CM | POA: Diagnosis not present

## 2013-02-16 DIAGNOSIS — R7309 Other abnormal glucose: Secondary | ICD-10-CM | POA: Diagnosis not present

## 2013-02-16 DIAGNOSIS — I10 Essential (primary) hypertension: Secondary | ICD-10-CM | POA: Diagnosis not present

## 2013-02-16 DIAGNOSIS — R82998 Other abnormal findings in urine: Secondary | ICD-10-CM | POA: Diagnosis not present

## 2013-02-16 DIAGNOSIS — E785 Hyperlipidemia, unspecified: Secondary | ICD-10-CM | POA: Diagnosis not present

## 2013-02-16 DIAGNOSIS — E559 Vitamin D deficiency, unspecified: Secondary | ICD-10-CM | POA: Diagnosis not present

## 2013-02-23 DIAGNOSIS — E785 Hyperlipidemia, unspecified: Secondary | ICD-10-CM | POA: Diagnosis not present

## 2013-02-23 DIAGNOSIS — R7309 Other abnormal glucose: Secondary | ICD-10-CM | POA: Diagnosis not present

## 2013-02-23 DIAGNOSIS — Z23 Encounter for immunization: Secondary | ICD-10-CM | POA: Diagnosis not present

## 2013-02-23 DIAGNOSIS — K5900 Constipation, unspecified: Secondary | ICD-10-CM | POA: Diagnosis not present

## 2013-02-23 DIAGNOSIS — R51 Headache: Secondary | ICD-10-CM | POA: Diagnosis not present

## 2013-02-23 DIAGNOSIS — M1990 Unspecified osteoarthritis, unspecified site: Secondary | ICD-10-CM | POA: Diagnosis not present

## 2013-02-23 DIAGNOSIS — IMO0001 Reserved for inherently not codable concepts without codable children: Secondary | ICD-10-CM | POA: Diagnosis not present

## 2013-02-23 DIAGNOSIS — Z0000 Encounter for general adult medical examination without abnormal findings: Secondary | ICD-10-CM | POA: Diagnosis not present

## 2013-02-23 DIAGNOSIS — Z1331 Encounter for screening for depression: Secondary | ICD-10-CM | POA: Diagnosis not present

## 2013-02-23 DIAGNOSIS — M810 Age-related osteoporosis without current pathological fracture: Secondary | ICD-10-CM | POA: Diagnosis not present

## 2013-02-24 DIAGNOSIS — H902 Conductive hearing loss, unspecified: Secondary | ICD-10-CM | POA: Diagnosis not present

## 2013-02-24 DIAGNOSIS — H6980 Other specified disorders of Eustachian tube, unspecified ear: Secondary | ICD-10-CM | POA: Diagnosis not present

## 2013-03-03 ENCOUNTER — Other Ambulatory Visit (HOSPITAL_COMMUNITY): Payer: Self-pay | Admitting: Internal Medicine

## 2013-03-03 DIAGNOSIS — Z1231 Encounter for screening mammogram for malignant neoplasm of breast: Secondary | ICD-10-CM

## 2013-03-08 ENCOUNTER — Ambulatory Visit (HOSPITAL_COMMUNITY): Payer: Medicare Other

## 2013-03-11 ENCOUNTER — Ambulatory Visit (HOSPITAL_COMMUNITY)
Admission: RE | Admit: 2013-03-11 | Discharge: 2013-03-11 | Disposition: A | Discharge status: Home / Self Care | Payer: Medicare Other | Source: Ambulatory Visit | Attending: Internal Medicine | Admitting: Internal Medicine

## 2013-03-11 DIAGNOSIS — Z1231 Encounter for screening mammogram for malignant neoplasm of breast: Secondary | ICD-10-CM | POA: Diagnosis not present

## 2013-04-05 DIAGNOSIS — IMO0002 Reserved for concepts with insufficient information to code with codable children: Secondary | ICD-10-CM | POA: Diagnosis not present

## 2013-04-05 DIAGNOSIS — J018 Other acute sinusitis: Secondary | ICD-10-CM | POA: Diagnosis not present

## 2013-04-05 DIAGNOSIS — J309 Allergic rhinitis, unspecified: Secondary | ICD-10-CM | POA: Diagnosis not present

## 2013-04-12 DIAGNOSIS — K5900 Constipation, unspecified: Secondary | ICD-10-CM | POA: Diagnosis not present

## 2013-04-12 DIAGNOSIS — Z86010 Personal history of colonic polyps: Secondary | ICD-10-CM | POA: Diagnosis not present

## 2013-04-14 DIAGNOSIS — H6980 Other specified disorders of Eustachian tube, unspecified ear: Secondary | ICD-10-CM | POA: Diagnosis not present

## 2013-04-14 DIAGNOSIS — R070 Pain in throat: Secondary | ICD-10-CM | POA: Diagnosis not present

## 2013-04-14 DIAGNOSIS — H902 Conductive hearing loss, unspecified: Secondary | ICD-10-CM | POA: Diagnosis not present

## 2013-04-14 DIAGNOSIS — H9209 Otalgia, unspecified ear: Secondary | ICD-10-CM | POA: Diagnosis not present

## 2013-04-27 DIAGNOSIS — H6980 Other specified disorders of Eustachian tube, unspecified ear: Secondary | ICD-10-CM | POA: Diagnosis not present

## 2013-04-27 DIAGNOSIS — J343 Hypertrophy of nasal turbinates: Secondary | ICD-10-CM | POA: Diagnosis not present

## 2013-04-27 DIAGNOSIS — H902 Conductive hearing loss, unspecified: Secondary | ICD-10-CM | POA: Diagnosis not present

## 2013-04-27 DIAGNOSIS — H6120 Impacted cerumen, unspecified ear: Secondary | ICD-10-CM | POA: Diagnosis not present

## 2013-04-27 DIAGNOSIS — J310 Chronic rhinitis: Secondary | ICD-10-CM | POA: Diagnosis not present

## 2013-05-11 DIAGNOSIS — J309 Allergic rhinitis, unspecified: Secondary | ICD-10-CM | POA: Diagnosis not present

## 2013-05-11 DIAGNOSIS — R05 Cough: Secondary | ICD-10-CM | POA: Diagnosis not present

## 2013-05-11 DIAGNOSIS — R0982 Postnasal drip: Secondary | ICD-10-CM | POA: Diagnosis not present

## 2013-05-11 DIAGNOSIS — Z6825 Body mass index (BMI) 25.0-25.9, adult: Secondary | ICD-10-CM | POA: Diagnosis not present

## 2013-05-14 DIAGNOSIS — H04129 Dry eye syndrome of unspecified lacrimal gland: Secondary | ICD-10-CM | POA: Diagnosis not present

## 2013-05-14 DIAGNOSIS — H02409 Unspecified ptosis of unspecified eyelid: Secondary | ICD-10-CM | POA: Diagnosis not present

## 2013-05-14 DIAGNOSIS — H40029 Open angle with borderline findings, high risk, unspecified eye: Secondary | ICD-10-CM | POA: Diagnosis not present

## 2013-05-25 DIAGNOSIS — IMO0002 Reserved for concepts with insufficient information to code with codable children: Secondary | ICD-10-CM | POA: Diagnosis not present

## 2013-05-25 DIAGNOSIS — J209 Acute bronchitis, unspecified: Secondary | ICD-10-CM | POA: Diagnosis not present

## 2013-05-25 DIAGNOSIS — R0609 Other forms of dyspnea: Secondary | ICD-10-CM | POA: Diagnosis not present

## 2013-07-10 ENCOUNTER — Emergency Department (HOSPITAL_COMMUNITY)
Admission: EM | Admit: 2013-07-10 | Discharge: 2013-07-10 | Disposition: A | Discharge status: Home / Self Care | Payer: Medicare Other | Attending: Emergency Medicine | Admitting: Emergency Medicine

## 2013-07-10 ENCOUNTER — Encounter (HOSPITAL_COMMUNITY): Payer: Self-pay | Admitting: Emergency Medicine

## 2013-07-10 ENCOUNTER — Emergency Department (HOSPITAL_COMMUNITY): Payer: Medicare Other

## 2013-07-10 DIAGNOSIS — K219 Gastro-esophageal reflux disease without esophagitis: Secondary | ICD-10-CM | POA: Diagnosis not present

## 2013-07-10 DIAGNOSIS — Z8639 Personal history of other endocrine, nutritional and metabolic disease: Secondary | ICD-10-CM | POA: Insufficient documentation

## 2013-07-10 DIAGNOSIS — Z7982 Long term (current) use of aspirin: Secondary | ICD-10-CM | POA: Insufficient documentation

## 2013-07-10 DIAGNOSIS — S61019A Laceration without foreign body of unspecified thumb without damage to nail, initial encounter: Secondary | ICD-10-CM

## 2013-07-10 DIAGNOSIS — Z79899 Other long term (current) drug therapy: Secondary | ICD-10-CM | POA: Diagnosis not present

## 2013-07-10 DIAGNOSIS — I10 Essential (primary) hypertension: Secondary | ICD-10-CM | POA: Diagnosis not present

## 2013-07-10 DIAGNOSIS — S61209A Unspecified open wound of unspecified finger without damage to nail, initial encounter: Secondary | ICD-10-CM | POA: Insufficient documentation

## 2013-07-10 DIAGNOSIS — Z23 Encounter for immunization: Secondary | ICD-10-CM | POA: Diagnosis not present

## 2013-07-10 DIAGNOSIS — Z862 Personal history of diseases of the blood and blood-forming organs and certain disorders involving the immune mechanism: Secondary | ICD-10-CM | POA: Diagnosis not present

## 2013-07-10 DIAGNOSIS — Y929 Unspecified place or not applicable: Secondary | ICD-10-CM | POA: Insufficient documentation

## 2013-07-10 DIAGNOSIS — Y9389 Activity, other specified: Secondary | ICD-10-CM | POA: Insufficient documentation

## 2013-07-10 DIAGNOSIS — W278XXA Contact with other nonpowered hand tool, initial encounter: Secondary | ICD-10-CM | POA: Insufficient documentation

## 2013-07-10 HISTORY — DX: Gastro-esophageal reflux disease without esophagitis: K21.9

## 2013-07-10 HISTORY — DX: Essential (primary) hypertension: I10

## 2013-07-10 MED ORDER — CEPHALEXIN 500 MG PO CAPS: 500.0000 mg | ORAL_CAPSULE | Freq: Four times a day (QID) | ORAL | Status: DC

## 2013-07-10 MED ORDER — SILVER NITRATE-POT NITRATE 75-25 % EX MISC
2.0000 | Freq: Once | CUTANEOUS | Status: AC
  Administered 2013-07-10: 2 via TOPICAL

## 2013-07-10 MED ORDER — TETANUS-DIPHTH-ACELL PERTUSSIS 5-2.5-18.5 LF-MCG/0.5 IM SUSP
0.5000 mL | Freq: Once | INTRAMUSCULAR | Status: AC
  Administered 2013-07-10: 0.5 mL via INTRAMUSCULAR
  Filled 2013-07-10: qty 0.5

## 2013-07-10 NOTE — ED Provider Notes (Signed)
CSN: [id number]     Arrival date & time 07/10/13  0736 History   First MD Initiated Contact with Patient 07/10/13 [phone]     Chief Complaint  Patient presents with  . Extremity Laceration   (Consider location/radiation/quality/duration/timing/severity/associated sxs/prior Treatment) HPI Comments: Pt comes in with laceration. Pt accidentally cut her right thumb on to a juicer blade. Injury at 5:30, unable to stop the bleed. No numbness, tingling. Not on anticoagulants.  The history is provided by the patient.    Past Medical History  Diagnosis Date  . Hyperlipidemia   . Hypertension   . GERD (gastroesophageal reflux disease)    Past Surgical History  Procedure Laterality Date  . Cesarean section    . Cesarean section     No family history on file. History  Substance Use Topics  . Smoking status: Never Smoker   . Smokeless tobacco: Not on file  . Alcohol Use: Not on file   OB History   Grav Para Term Preterm Abortions TAB SAB Ect Mult Living                 Review of Systems  Constitutional: Negative for activity change.  Musculoskeletal: Negative for arthralgias and joint swelling.  Skin: Positive for wound.  Allergic/Immunologic: Negative for immunocompromised state.  Neurological: Negative for numbness.  Hematological: Does not bruise/bleed easily.    Allergies  Naproxen and Prednisone  Home Medications   Current Outpatient Rx  Name  Route  Sig  Dispense  Refill  . amLODipine (NORVASC) 2.5 MG tablet   Oral   Take 2.5 mg by mouth daily.         Marland Kitchen aspirin 81 MG tablet   Oral   Take 81 mg by mouth daily.         . fish oil-omega-3 fatty acids 1000 MG capsule   Oral   Take 1 g by mouth daily.         . lansoprazole (PREVACID) 15 MG capsule   Oral   Take 15 mg by mouth daily.         . Multiple Vitamins-Minerals (MULTIVITAMIN WITH MINERALS) tablet   Oral   Take 1 tablet by mouth daily.         Marland Kitchen spironolactone (ALDACTONE) 25 MG tablet  Oral   Take 25 mg by mouth daily.          BP 158/78  Pulse 83  Temp(Src) 98.9 F (37.2 C) (Oral)  Resp 15  SpO2 95% Physical Exam  Nursing note and vitals reviewed. Constitutional: She is oriented to person, place, and time. She appears well-developed.  HENT:  Head: Atraumatic.  Eyes: Conjunctivae are normal.  Neck: Neck supple.  Musculoskeletal:  Right thumb - volar laceration distal to the ip joint that is 0.75 cm in length. Oozing blood. ROM over the ip joint is intact.  Neurological: She is alert and oriented to person, place, and time.    ED Course  Wound closure utilizing adhes only Date/Time: 07/10/2013 10:18 AM Performed by: Varney Biles Authorized by: Varney Biles Consent: Verbal consent obtained. Risks and benefits: risks, benefits and alternatives were discussed Consent given by: patient Imaging studies: imaging studies available Patient identity confirmed: verbally with patient Time out: Immediately prior to procedure a "time out" was called to verify the correct patient, procedure, equipment, support staff and site/side marked as required. Local anesthesia used: no Patient sedated: no Patient tolerance: Patient tolerated the procedure well with no immediate complications.   (  including critical care time) Labs Review Labs Reviewed - No data to display Imaging Review No results found.  EKG Interpretation   None       MDM  No diagnosis found.  Pt comes in with cc of laceration from a blade injury. TDAP uptodated. Finger immersed in betadine solution, xray r/o foreign body, gross visualization normal. Dermabond used - as the bleeding eventually ceased.   Varney Biles, MD 07/10/13 1018

## 2013-07-10 NOTE — ED Notes (Signed)
MD at bedside. 

## 2013-07-10 NOTE — ED Notes (Signed)
Pt transported to XRAY °

## 2013-07-10 NOTE — ED Notes (Signed)
She states she lac. Her right thumb on a "ninja blade" in an appliance into which she reached this morning.  She has a .5cm puncture-type wound at right thumb finger pad area which continues to ooze blood.  She is in no distress.

## 2013-07-10 NOTE — Discharge Instructions (Signed)
Laceration Care, Adult °A laceration is a cut or lesion that goes through all layers of the skin and into the tissue just beneath the skin. °TREATMENT  °Some lacerations may not require closure. Some lacerations may not be able to be closed due to an increased risk of infection. It is important to see your caregiver as soon as possible after an injury to minimize the risk of infection and maximize the opportunity for successful closure. °If closure is appropriate, pain medicines may be given, if needed. The wound will be cleaned to help prevent infection. Your caregiver will use stitches (sutures), staples, wound glue (adhesive), or skin adhesive strips to repair the laceration. These tools bring the skin edges together to allow for faster healing and a better cosmetic outcome. However, all wounds will heal with a scar. Once the wound has healed, scarring can be minimized by covering the wound with sunscreen during the day for 1 full year. °HOME CARE INSTRUCTIONS  °For sutures or staples: °· Keep the wound clean and dry. °· If you were given a bandage (dressing), you should change it at least once a day. Also, change the dressing if it becomes wet or dirty, or as directed by your caregiver. °· Wash the wound with soap and water 2 times a day. Rinse the wound off with water to remove all soap. Pat the wound dry with a clean towel. °· After cleaning, apply a thin layer of the antibiotic ointment as recommended by your caregiver. This will help prevent infection and keep the dressing from sticking. °· You may shower as usual after the first 24 hours. Do not soak the wound in water until the sutures are removed. °· Only take over-the-counter or prescription medicines for pain, discomfort, or fever as directed by your caregiver. °· Get your sutures or staples removed as directed by your caregiver. °For skin adhesive strips: °· Keep the wound clean and dry. °· Do not get the skin adhesive strips wet. You may bathe  carefully, using caution to keep the wound dry. °· If the wound gets wet, pat it dry with a clean towel. °· Skin adhesive strips will fall off on their own. You may trim the strips as the wound heals. Do not remove skin adhesive strips that are still stuck to the wound. They will fall off in time. °For wound adhesive: °· You may briefly wet your wound in the shower or bath. Do not soak or scrub the wound. Do not swim. Avoid periods of heavy perspiration until the skin adhesive has fallen off on its own. After showering or bathing, gently pat the wound dry with a clean towel. °· Do not apply liquid medicine, cream medicine, or ointment medicine to your wound while the skin adhesive is in place. This may loosen the film before your wound is healed. °· If a dressing is placed over the wound, be careful not to apply tape directly over the skin adhesive. This may cause the adhesive to be pulled off before the wound is healed. °· Avoid prolonged exposure to sunlight or tanning lamps while the skin adhesive is in place. Exposure to ultraviolet light in the first year will darken the scar. °· The skin adhesive will usually remain in place for 5 to 10 days, then naturally fall off the skin. Do not pick at the adhesive film. °You may need a tetanus shot if: °· You cannot remember when you had your last tetanus shot. °· You have never had a tetanus   shot. If you get a tetanus shot, your arm may swell, get red, and feel warm to the touch. This is common and not a problem. If you need a tetanus shot and you choose not to have one, there is a rare chance of getting tetanus. Sickness from tetanus can be serious. SEEK MEDICAL CARE IF:   You have redness, swelling, or increasing pain in the wound.  You see a red line that goes away from the wound.  You have yellowish-white fluid (pus) coming from the wound.  You have a fever.  You notice a bad smell coming from the wound or dressing.  Your wound breaks open before or  after sutures have been removed.  You notice something coming out of the wound such as wood or glass.  Your wound is on your hand or foot and you cannot move a finger or toe. SEEK IMMEDIATE MEDICAL CARE IF:   Your pain is not controlled with prescribed medicine.  You have severe swelling around the wound causing pain and numbness or a change in color in your arm, hand, leg, or foot.  Your wound splits open and starts bleeding.  You have worsening numbness, weakness, or loss of function of any joint around or beyond the wound.  You develop painful lumps near the wound or on the skin anywhere on your body. MAKE SURE YOU:   Understand these instructions.  Will watch your condition.  Will get help right away if you are not doing well or get worse. Document Released: 05/27/2005 Document Revised: 08/19/2011 Document Reviewed: 11/20/2010 [facility] Patient Information 2014 [place], [place].  Stitches, Staples, or Skin Adhesive Strips  Stitches (sutures), staples, and skin adhesive strips hold the skin together as it heals. They will usually be in place for 7 days or less. HOME CARE  Wash your hands with soap and water before and after you touch your wound.  Only take medicine as told by your doctor.  Cover your wound only if your doctor told you to. Otherwise, leave it open to air.  Do not get your stitches wet or dirty. If they get dirty, dab them gently with a clean washcloth. Wet the washcloth with soapy water. Do not rub. Pat them dry gently.  Do not put medicine or medicated cream on your stitches unless your doctor told you to.  Do not take out your own stitches or staples. Skin adhesive strips will fall off by themselves.  Do not pick at the wound. Picking can cause an infection.  Do not miss your follow-up appointment.  If you have problems or questions, call your doctor. GET HELP RIGHT AWAY IF:   You have a temperature by mouth above 102 F (38.9 C), not controlled by  medicine.  You have chills.  You have redness or pain around your stitches.  There is puffiness (swelling) around your stitches.  You notice fluid (drainage) from your stitches.  There is a bad smell coming from your wound. MAKE SURE YOU:  Understand these instructions.  Will watch your condition.  Will get help if you are not doing well or get worse. Document Released: 03/24/2009 Document Revised: 08/19/2011 Document Reviewed: 03/24/2009 [facility] Patient Information 2014 [place], [place].

## 2013-07-13 DIAGNOSIS — S61209A Unspecified open wound of unspecified finger without damage to nail, initial encounter: Secondary | ICD-10-CM | POA: Diagnosis not present

## 2013-07-13 DIAGNOSIS — Z6826 Body mass index (BMI) 26.0-26.9, adult: Secondary | ICD-10-CM | POA: Diagnosis not present

## 2013-08-06 DIAGNOSIS — M79609 Pain in unspecified limb: Secondary | ICD-10-CM | POA: Diagnosis not present

## 2013-08-06 DIAGNOSIS — Z6825 Body mass index (BMI) 25.0-25.9, adult: Secondary | ICD-10-CM | POA: Diagnosis not present

## 2013-08-06 DIAGNOSIS — Z9189 Other specified personal risk factors, not elsewhere classified: Secondary | ICD-10-CM | POA: Diagnosis not present

## 2013-08-06 DIAGNOSIS — J45909 Unspecified asthma, uncomplicated: Secondary | ICD-10-CM | POA: Diagnosis not present

## 2013-08-06 DIAGNOSIS — I10 Essential (primary) hypertension: Secondary | ICD-10-CM | POA: Diagnosis not present

## 2013-08-06 DIAGNOSIS — R0602 Shortness of breath: Secondary | ICD-10-CM | POA: Diagnosis not present

## 2013-08-09 DIAGNOSIS — R799 Abnormal finding of blood chemistry, unspecified: Secondary | ICD-10-CM | POA: Diagnosis not present

## 2013-08-10 DIAGNOSIS — IMO0001 Reserved for inherently not codable concepts without codable children: Secondary | ICD-10-CM | POA: Diagnosis not present

## 2013-08-10 DIAGNOSIS — Z6825 Body mass index (BMI) 25.0-25.9, adult: Secondary | ICD-10-CM | POA: Diagnosis not present

## 2013-08-10 DIAGNOSIS — M79609 Pain in unspecified limb: Secondary | ICD-10-CM | POA: Diagnosis not present

## 2013-08-10 DIAGNOSIS — J45909 Unspecified asthma, uncomplicated: Secondary | ICD-10-CM | POA: Diagnosis not present

## 2013-08-10 DIAGNOSIS — K219 Gastro-esophageal reflux disease without esophagitis: Secondary | ICD-10-CM | POA: Diagnosis not present

## 2013-08-10 DIAGNOSIS — I10 Essential (primary) hypertension: Secondary | ICD-10-CM | POA: Diagnosis not present

## 2013-08-24 DIAGNOSIS — I10 Essential (primary) hypertension: Secondary | ICD-10-CM | POA: Diagnosis not present

## 2013-08-24 DIAGNOSIS — M1990 Unspecified osteoarthritis, unspecified site: Secondary | ICD-10-CM | POA: Diagnosis not present

## 2013-08-24 DIAGNOSIS — IMO0002 Reserved for concepts with insufficient information to code with codable children: Secondary | ICD-10-CM | POA: Diagnosis not present

## 2013-08-24 DIAGNOSIS — R0982 Postnasal drip: Secondary | ICD-10-CM | POA: Diagnosis not present

## 2013-08-24 DIAGNOSIS — M25569 Pain in unspecified knee: Secondary | ICD-10-CM | POA: Diagnosis not present

## 2013-08-24 DIAGNOSIS — J309 Allergic rhinitis, unspecified: Secondary | ICD-10-CM | POA: Diagnosis not present

## 2013-08-24 DIAGNOSIS — E785 Hyperlipidemia, unspecified: Secondary | ICD-10-CM | POA: Diagnosis not present

## 2013-10-04 DIAGNOSIS — M25569 Pain in unspecified knee: Secondary | ICD-10-CM | POA: Diagnosis not present

## 2013-10-04 DIAGNOSIS — M79609 Pain in unspecified limb: Secondary | ICD-10-CM | POA: Diagnosis not present

## 2013-10-12 DIAGNOSIS — R079 Chest pain, unspecified: Secondary | ICD-10-CM | POA: Diagnosis not present

## 2013-10-12 DIAGNOSIS — Z6825 Body mass index (BMI) 25.0-25.9, adult: Secondary | ICD-10-CM | POA: Diagnosis not present

## 2013-10-14 DIAGNOSIS — R0989 Other specified symptoms and signs involving the circulatory and respiratory systems: Secondary | ICD-10-CM | POA: Diagnosis not present

## 2013-10-14 DIAGNOSIS — I10 Essential (primary) hypertension: Secondary | ICD-10-CM | POA: Diagnosis not present

## 2013-10-14 DIAGNOSIS — R079 Chest pain, unspecified: Secondary | ICD-10-CM | POA: Diagnosis not present

## 2013-10-14 DIAGNOSIS — R0609 Other forms of dyspnea: Secondary | ICD-10-CM | POA: Diagnosis not present

## 2013-10-14 DIAGNOSIS — Z6825 Body mass index (BMI) 25.0-25.9, adult: Secondary | ICD-10-CM | POA: Diagnosis not present

## 2013-10-14 DIAGNOSIS — R9431 Abnormal electrocardiogram [ECG] [EKG]: Secondary | ICD-10-CM | POA: Diagnosis not present

## 2013-10-14 DIAGNOSIS — R609 Edema, unspecified: Secondary | ICD-10-CM | POA: Diagnosis not present

## 2013-10-15 ENCOUNTER — Other Ambulatory Visit (HOSPITAL_COMMUNITY): Payer: Self-pay | Admitting: Internal Medicine

## 2013-10-15 DIAGNOSIS — R0989 Other specified symptoms and signs involving the circulatory and respiratory systems: Secondary | ICD-10-CM

## 2013-10-15 DIAGNOSIS — R9431 Abnormal electrocardiogram [ECG] [EKG]: Secondary | ICD-10-CM

## 2013-10-15 DIAGNOSIS — R0609 Other forms of dyspnea: Secondary | ICD-10-CM

## 2013-10-15 DIAGNOSIS — I10 Essential (primary) hypertension: Secondary | ICD-10-CM

## 2013-10-18 ENCOUNTER — Ambulatory Visit (HOSPITAL_COMMUNITY)
Admission: RE | Admit: 2013-10-18 | Discharge: 2013-10-18 | Disposition: A | Discharge status: Home / Self Care | Payer: Medicare Other | Source: Ambulatory Visit | Attending: Cardiology | Admitting: Cardiology

## 2013-10-18 DIAGNOSIS — R9431 Abnormal electrocardiogram [ECG] [EKG]: Secondary | ICD-10-CM

## 2013-10-18 DIAGNOSIS — R609 Edema, unspecified: Secondary | ICD-10-CM | POA: Diagnosis not present

## 2013-10-18 DIAGNOSIS — R0989 Other specified symptoms and signs involving the circulatory and respiratory systems: Secondary | ICD-10-CM

## 2013-10-18 DIAGNOSIS — I369 Nonrheumatic tricuspid valve disorder, unspecified: Secondary | ICD-10-CM | POA: Diagnosis not present

## 2013-10-18 DIAGNOSIS — I10 Essential (primary) hypertension: Secondary | ICD-10-CM

## 2013-10-18 DIAGNOSIS — R0609 Other forms of dyspnea: Secondary | ICD-10-CM

## 2013-10-18 NOTE — Progress Notes (Signed)
2D Echo Performed 10/18/2013    Mazi Brailsford, RCS  

## 2013-10-26 DIAGNOSIS — H902 Conductive hearing loss, unspecified: Secondary | ICD-10-CM | POA: Diagnosis not present

## 2013-10-26 DIAGNOSIS — H6980 Other specified disorders of Eustachian tube, unspecified ear: Secondary | ICD-10-CM | POA: Diagnosis not present

## 2013-10-26 DIAGNOSIS — H903 Sensorineural hearing loss, bilateral: Secondary | ICD-10-CM | POA: Diagnosis not present

## 2013-12-13 DIAGNOSIS — H40029 Open angle with borderline findings, high risk, unspecified eye: Secondary | ICD-10-CM | POA: Diagnosis not present

## 2013-12-13 DIAGNOSIS — H25019 Cortical age-related cataract, unspecified eye: Secondary | ICD-10-CM | POA: Diagnosis not present

## 2013-12-13 DIAGNOSIS — H2510 Age-related nuclear cataract, unspecified eye: Secondary | ICD-10-CM | POA: Diagnosis not present

## 2013-12-13 DIAGNOSIS — H3581 Retinal edema: Secondary | ICD-10-CM | POA: Diagnosis not present

## 2013-12-13 DIAGNOSIS — H524 Presbyopia: Secondary | ICD-10-CM | POA: Diagnosis not present

## 2013-12-16 DIAGNOSIS — I499 Cardiac arrhythmia, unspecified: Secondary | ICD-10-CM | POA: Diagnosis not present

## 2013-12-16 DIAGNOSIS — Z6825 Body mass index (BMI) 25.0-25.9, adult: Secondary | ICD-10-CM | POA: Diagnosis not present

## 2013-12-16 DIAGNOSIS — I10 Essential (primary) hypertension: Secondary | ICD-10-CM | POA: Diagnosis not present

## 2013-12-16 DIAGNOSIS — J45909 Unspecified asthma, uncomplicated: Secondary | ICD-10-CM | POA: Diagnosis not present

## 2013-12-22 ENCOUNTER — Other Ambulatory Visit: Payer: Self-pay | Admitting: *Deleted

## 2013-12-22 ENCOUNTER — Encounter: Payer: Self-pay | Admitting: *Deleted

## 2013-12-22 ENCOUNTER — Encounter (INDEPENDENT_AMBULATORY_CARE_PROVIDER_SITE_OTHER): Payer: Medicare Other

## 2013-12-22 DIAGNOSIS — I499 Cardiac arrhythmia, unspecified: Secondary | ICD-10-CM

## 2013-12-22 DIAGNOSIS — I491 Atrial premature depolarization: Secondary | ICD-10-CM

## 2013-12-22 NOTE — Progress Notes (Signed)
Patient ID: Sara Brown, female   DOB: 03/31/1941, 73 y.o.   MRN: [id number] E-cardio 24 hour holter monitor applied to patient.

## 2014-02-03 ENCOUNTER — Ambulatory Visit: Payer: Medicare Other | Admitting: Podiatry

## 2014-02-03 ENCOUNTER — Ambulatory Visit (INDEPENDENT_AMBULATORY_CARE_PROVIDER_SITE_OTHER): Payer: Medicare Other

## 2014-02-03 ENCOUNTER — Encounter: Payer: Self-pay | Admitting: Podiatry

## 2014-02-03 VITALS — BP 124/68 | HR 68 | Resp 18 | Ht 67.0 in | Wt 156.0 lb

## 2014-02-03 DIAGNOSIS — Q6650 Congenital pes planus, unspecified foot: Secondary | ICD-10-CM | POA: Diagnosis not present

## 2014-02-03 DIAGNOSIS — M19079 Primary osteoarthritis, unspecified ankle and foot: Secondary | ICD-10-CM

## 2014-02-03 DIAGNOSIS — M2040 Other hammer toe(s) (acquired), unspecified foot: Secondary | ICD-10-CM | POA: Diagnosis not present

## 2014-02-03 NOTE — Progress Notes (Signed)
She presents today as a new patient complaining flat feet and hammertoe to the second digit of the left foot. She states she saw Dr. recently suggested that she get a pair of orthotics made.  Objective: I have reviewed her past medical history medications allergies surgeries social history and review of systems. Pulses are strongly palpable bilateral. Pes planus is noted bilateral. She's hammertoe deformity second left. 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 Hammertoe deformity second digit left foot. Overlying reactive hyperkeratosis PIPJ.  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  Plan: Debridement of reactive hyperkeratosis. Discussed with her today the difference between accommodative orthotic in a functional orthotic. She understands that is amenable to it she was dispensed a pair of our steps.

## 2014-02-18 ENCOUNTER — Other Ambulatory Visit: Payer: Self-pay | Admitting: Gastroenterology

## 2014-02-18 DIAGNOSIS — Z09 Encounter for follow-up examination after completed treatment for conditions other than malignant neoplasm: Secondary | ICD-10-CM | POA: Diagnosis not present

## 2014-02-18 DIAGNOSIS — D126 Benign neoplasm of colon, unspecified: Secondary | ICD-10-CM | POA: Diagnosis not present

## 2014-02-18 DIAGNOSIS — K6389 Other specified diseases of intestine: Secondary | ICD-10-CM | POA: Diagnosis not present

## 2014-02-18 DIAGNOSIS — Z86010 Personal history of colonic polyps: Secondary | ICD-10-CM | POA: Diagnosis not present

## 2014-02-18 DIAGNOSIS — K5289 Other specified noninfective gastroenteritis and colitis: Secondary | ICD-10-CM | POA: Diagnosis not present

## 2014-02-24 DIAGNOSIS — I10 Essential (primary) hypertension: Secondary | ICD-10-CM | POA: Diagnosis not present

## 2014-02-24 DIAGNOSIS — M810 Age-related osteoporosis without current pathological fracture: Secondary | ICD-10-CM | POA: Diagnosis not present

## 2014-02-24 DIAGNOSIS — E785 Hyperlipidemia, unspecified: Secondary | ICD-10-CM | POA: Diagnosis not present

## 2014-02-24 DIAGNOSIS — E559 Vitamin D deficiency, unspecified: Secondary | ICD-10-CM | POA: Diagnosis not present

## 2014-02-24 DIAGNOSIS — R7309 Other abnormal glucose: Secondary | ICD-10-CM | POA: Diagnosis not present

## 2014-03-08 ENCOUNTER — Other Ambulatory Visit (HOSPITAL_COMMUNITY): Payer: Self-pay | Admitting: Internal Medicine

## 2014-03-08 DIAGNOSIS — Z1231 Encounter for screening mammogram for malignant neoplasm of breast: Secondary | ICD-10-CM

## 2014-03-15 DIAGNOSIS — I272 Other secondary pulmonary hypertension: Secondary | ICD-10-CM | POA: Diagnosis not present

## 2014-03-15 DIAGNOSIS — I499 Cardiac arrhythmia, unspecified: Secondary | ICD-10-CM | POA: Diagnosis not present

## 2014-03-15 DIAGNOSIS — Z23 Encounter for immunization: Secondary | ICD-10-CM | POA: Diagnosis not present

## 2014-03-15 DIAGNOSIS — R0982 Postnasal drip: Secondary | ICD-10-CM | POA: Diagnosis not present

## 2014-03-15 DIAGNOSIS — E785 Hyperlipidemia, unspecified: Secondary | ICD-10-CM | POA: Diagnosis not present

## 2014-03-15 DIAGNOSIS — I10 Essential (primary) hypertension: Secondary | ICD-10-CM | POA: Diagnosis not present

## 2014-03-15 DIAGNOSIS — M810 Age-related osteoporosis without current pathological fracture: Secondary | ICD-10-CM | POA: Diagnosis not present

## 2014-03-15 DIAGNOSIS — Z1389 Encounter for screening for other disorder: Secondary | ICD-10-CM | POA: Diagnosis not present

## 2014-03-15 DIAGNOSIS — E559 Vitamin D deficiency, unspecified: Secondary | ICD-10-CM | POA: Diagnosis not present

## 2014-03-15 DIAGNOSIS — Z008 Encounter for other general examination: Secondary | ICD-10-CM | POA: Diagnosis not present

## 2014-03-21 ENCOUNTER — Ambulatory Visit (HOSPITAL_COMMUNITY)
Admission: RE | Admit: 2014-03-21 | Discharge: 2014-03-21 | Disposition: A | Discharge status: Home / Self Care | Payer: Medicare Other | Source: Ambulatory Visit | Attending: Internal Medicine | Admitting: Internal Medicine

## 2014-03-21 DIAGNOSIS — Z1231 Encounter for screening mammogram for malignant neoplasm of breast: Secondary | ICD-10-CM | POA: Insufficient documentation

## 2014-03-23 DIAGNOSIS — Z86010 Personal history of colonic polyps: Secondary | ICD-10-CM | POA: Diagnosis not present

## 2014-03-23 DIAGNOSIS — K5900 Constipation, unspecified: Secondary | ICD-10-CM | POA: Diagnosis not present

## 2014-05-13 DIAGNOSIS — H2511 Age-related nuclear cataract, right eye: Secondary | ICD-10-CM | POA: Diagnosis not present

## 2014-05-13 DIAGNOSIS — H40023 Open angle with borderline findings, high risk, bilateral: Secondary | ICD-10-CM | POA: Diagnosis not present

## 2014-05-13 DIAGNOSIS — H35033 Hypertensive retinopathy, bilateral: Secondary | ICD-10-CM | POA: Diagnosis not present

## 2014-05-16 DIAGNOSIS — Z6824 Body mass index (BMI) 24.0-24.9, adult: Secondary | ICD-10-CM | POA: Diagnosis not present

## 2014-05-16 DIAGNOSIS — I10 Essential (primary) hypertension: Secondary | ICD-10-CM | POA: Diagnosis not present

## 2014-05-16 DIAGNOSIS — R202 Paresthesia of skin: Secondary | ICD-10-CM | POA: Diagnosis not present

## 2014-05-16 DIAGNOSIS — I6529 Occlusion and stenosis of unspecified carotid artery: Secondary | ICD-10-CM | POA: Diagnosis not present

## 2014-05-19 DIAGNOSIS — I6529 Occlusion and stenosis of unspecified carotid artery: Secondary | ICD-10-CM | POA: Diagnosis not present

## 2014-05-26 ENCOUNTER — Other Ambulatory Visit: Payer: Self-pay | Admitting: Internal Medicine

## 2014-05-26 DIAGNOSIS — R51 Headache: Principal | ICD-10-CM

## 2014-05-26 DIAGNOSIS — R519 Headache, unspecified: Secondary | ICD-10-CM

## 2014-05-27 ENCOUNTER — Ambulatory Visit
Admission: RE | Admit: 2014-05-27 | Discharge: 2014-05-27 | Disposition: A | Discharge status: Home / Self Care | Payer: Medicare Other | Source: Ambulatory Visit | Attending: Internal Medicine | Admitting: Internal Medicine

## 2014-05-27 DIAGNOSIS — R51 Headache: Secondary | ICD-10-CM | POA: Diagnosis not present

## 2014-05-27 DIAGNOSIS — R519 Headache, unspecified: Secondary | ICD-10-CM

## 2014-05-31 ENCOUNTER — Emergency Department (HOSPITAL_COMMUNITY)
Admission: EM | Admit: 2014-05-31 | Discharge: 2014-05-31 | Disposition: A | Discharge status: Home / Self Care | Payer: Medicare Other | Attending: Emergency Medicine | Admitting: Emergency Medicine

## 2014-05-31 ENCOUNTER — Encounter (HOSPITAL_COMMUNITY): Payer: Self-pay | Admitting: Emergency Medicine

## 2014-05-31 DIAGNOSIS — I10 Essential (primary) hypertension: Secondary | ICD-10-CM | POA: Diagnosis not present

## 2014-05-31 DIAGNOSIS — G44201 Tension-type headache, unspecified, intractable: Secondary | ICD-10-CM | POA: Diagnosis not present

## 2014-05-31 DIAGNOSIS — Z7982 Long term (current) use of aspirin: Secondary | ICD-10-CM | POA: Diagnosis not present

## 2014-05-31 DIAGNOSIS — E785 Hyperlipidemia, unspecified: Secondary | ICD-10-CM | POA: Diagnosis not present

## 2014-05-31 DIAGNOSIS — Z79899 Other long term (current) drug therapy: Secondary | ICD-10-CM | POA: Insufficient documentation

## 2014-05-31 DIAGNOSIS — K219 Gastro-esophageal reflux disease without esophagitis: Secondary | ICD-10-CM | POA: Diagnosis not present

## 2014-05-31 DIAGNOSIS — G44209 Tension-type headache, unspecified, not intractable: Secondary | ICD-10-CM | POA: Insufficient documentation

## 2014-05-31 MED ORDER — CYCLOBENZAPRINE HCL 10 MG PO TABS: 10.0000 mg | ORAL_TABLET | Freq: Two times a day (BID) | ORAL | Status: DC | PRN

## 2014-05-31 NOTE — ED Notes (Signed)
Patient c/o hypertension. Reports home BP was 142/80. Patient reports "uncomfortable feeling" in back of head, neck, tongue x1 day, tightness in shoulders. Denies pain at this time. Patient denies visual changes, denies trouble speaking, denies trouble swallowing.

## 2014-05-31 NOTE — ED Notes (Signed)
Awake. Verbally responsive. A/O x4. Resp even and unlabored. No audible adventitious breath sounds noted. ABC's intact. SR on monitor at 67bpm.

## 2014-05-31 NOTE — ED Notes (Signed)
Pt ambulates well independently.

## 2014-05-31 NOTE — ED Provider Notes (Signed)
CSN: [id number]     Arrival date & time 05/31/14  0126 History   First MD Initiated Contact with Patient 05/31/14 0132     Chief Complaint  Patient presents with  . Hypertension     (Consider location/radiation/quality/duration/timing/severity/associated sxs/prior Treatment) Patient is a 73 y.o. female presenting with hypertension. The history is provided by the patient. No language interpreter was used.  Hypertension Pertinent negatives include no chills or fever. Associated symptoms comments: She woke this morning and had pressure to head and neck. She found her blood pressure to be 142/80 and became concerned. She reports that the pressure started about 6 weeks ago in her right face, extending to right neck and down right arm. No weakness. The tightness now includes the left face, left neck and no involvement of left arm. She has seen her doctor (Dr. Virgina Jock) who has evaluated this concern with carotid dopplers and a head CT, both of which are reported to be negative by patient. No syncope, chest pain, SOB, fever, visual changes or injury. .    Past Medical History  Diagnosis Date  . Hyperlipidemia   . Hypertension   . GERD (gastroesophageal reflux disease)    Past Surgical History  Procedure Laterality Date  . Cesarean section    . Cesarean section     No family history on file. History  Substance Use Topics  . Smoking status: Never Smoker   . Smokeless tobacco: Not on file  . Alcohol Use: Not on file   OB History    No data available     Review of Systems  Constitutional: Negative for fever and chills.  HENT: Negative.   Respiratory: Negative.   Cardiovascular: Negative.   Gastrointestinal: Negative.   Musculoskeletal: Negative.   Skin: Negative.   Neurological: Negative.       Allergies  Naproxen and Prednisone  Home Medications   Prior to Admission medications   Medication Sig Start Date End Date Taking? Authorizing Provider  amLODipine (NORVASC) 2.5 MG  tablet Take 2.5 mg by mouth daily.    Historical Provider, MD  aspirin 81 MG tablet Take 81 mg by mouth daily.    Historical Provider, MD  budesonide-formoterol (SYMBICORT) 160-4.5 MCG/ACT inhaler Inhale 2 puffs into the lungs 2 (two) times daily as needed (shortness of breath).    Historical Provider, MD  Colesevelam HCl (WELCHOL PO) Take by mouth.    Historical Provider, MD  fish oil-omega-3 fatty acids 1000 MG capsule Take 1 g by mouth daily.    Historical Provider, MD  lansoprazole (PREVACID) 15 MG capsule Take 15 mg by mouth daily.    Historical Provider, MD  Multiple Vitamins-Minerals (MULTIVITAMIN WITH MINERALS) tablet Take 1 tablet by mouth daily.    Historical Provider, MD  spironolactone (ALDACTONE) 25 MG tablet Take 25 mg by mouth daily.    Historical Provider, MD   BP 122/65 mmHg  Pulse 66  Temp(Src) 98.2 F (36.8 C) (Oral)  Resp 13  SpO2 100% Physical Exam  Constitutional: She is oriented to person, place, and time. She appears well-developed and well-nourished.  HENT:  Head: Normocephalic.  Eyes: Pupils are equal, round, and reactive to light.  Neck: Normal range of motion. Neck supple.  Cardiovascular: Normal rate and regular rhythm.   Pulmonary/Chest: Effort normal and breath sounds normal.  Abdominal: Soft. Bowel sounds are normal. There is no tenderness. There is no rebound and no guarding.  Musculoskeletal: Normal range of motion. She exhibits no edema.  Neurological: She is  alert and oriented to person, place, and time. She has normal strength and normal reflexes. No sensory deficit. She displays a negative Romberg sign. Coordination normal.  Speech clear and focused. CN's 3-12 grossly intact. No facial symmetry. Ambulatory without ataxia. No deficits of coordination.  Skin: Skin is warm and dry. No rash noted.  Psychiatric: She has a normal mood and affect.    ED Course  Procedures (including critical care time) Labs Review Labs Reviewed - No data to  display  Imaging Review No results found.   EKG Interpretation   Date/Time:  Tuesday May 31 2014 01:59:13 EST Ventricular Rate:  59 PR Interval:  172 QRS Duration: 94 QT Interval:  430 QTC Calculation: 426 R Axis:   -15 Text Interpretation:  Sinus rhythm Borderline left axis deviation  Confirmed by OTTER  MD, OLGA ([id number]) on 05/31/2014 2:05:14 AM      MDM   Final diagnoses:  None    1. Hypertension 2. Muscle tension  Uncomplicated muscular tension of shoulders. HTN well controlled. Symptoms for 6 weeks, doubt overlying acute process.     Dewaine Oats, PA-C 05/31/14 [place], MD 05/31/14 [phone]

## 2014-05-31 NOTE — Discharge Instructions (Signed)

## 2014-05-31 NOTE — ED Notes (Signed)
Pt ambulated to BR.  Gait steady.

## 2014-05-31 NOTE — ED Notes (Signed)
Awake. Verbally responsive. A/O x4. Resp even and unlabored. No audible adventitious breath sounds noted. ABC's intact.  

## 2014-06-15 DIAGNOSIS — M542 Cervicalgia: Secondary | ICD-10-CM | POA: Diagnosis not present

## 2014-06-15 DIAGNOSIS — I10 Essential (primary) hypertension: Secondary | ICD-10-CM | POA: Diagnosis not present

## 2014-06-15 DIAGNOSIS — Z6825 Body mass index (BMI) 25.0-25.9, adult: Secondary | ICD-10-CM | POA: Diagnosis not present

## 2014-06-28 ENCOUNTER — Encounter: Payer: Self-pay | Admitting: Physical Therapy

## 2014-06-28 ENCOUNTER — Telehealth: Payer: Self-pay | Admitting: *Deleted

## 2014-06-28 ENCOUNTER — Ambulatory Visit: Payer: Medicare Other | Attending: Internal Medicine | Admitting: Physical Therapy

## 2014-06-28 DIAGNOSIS — M25519 Pain in unspecified shoulder: Secondary | ICD-10-CM | POA: Insufficient documentation

## 2014-06-28 DIAGNOSIS — R208 Other disturbances of skin sensation: Secondary | ICD-10-CM | POA: Insufficient documentation

## 2014-06-28 DIAGNOSIS — M542 Cervicalgia: Secondary | ICD-10-CM | POA: Diagnosis not present

## 2014-06-28 DIAGNOSIS — R209 Unspecified disturbances of skin sensation: Secondary | ICD-10-CM

## 2014-06-28 NOTE — Therapy (Signed)
[place], [name], [address] Phone: [phone]   Fax:  [phone]  Physical Therapy Evaluation  Patient Details  Name: Sara Brown MRN: [id number] Date of Birth: 1941/05/19 Referring Provider:  Precious Reel, MD  Encounter Date: 06/28/2014      PT End of Session - 06/28/14 1324    Visit Number 1   Number of Visits 12   Date for PT Re-Evaluation 08/09/14   PT Start Time 0900   PT Stop Time 0933   PT Time Calculation (min) 33 min   Activity Tolerance Patient tolerated treatment well      Past Medical History  Diagnosis Date  . Hyperlipidemia   . Hypertension   . GERD (gastroesophageal reflux disease)     Past Surgical History  Procedure Laterality Date  . Cesarean section    . Cesarean section      There were no vitals taken for this visit.  Visit Diagnosis:  Neck and shoulder pain  Sensory disturbance      Subjective Assessment - 06/28/14 0902    Symptoms Pt presents with ongoing pain in neck and Rt. UE and also Rt. side of face, tongue. She has been to the ED, worked up and all tests have been negative.  She has pain, decr ROM and muscle spasm in upper back, neck and shoulders which limit her ability to use  UE for household activities. and use computer.    Limitations Sitting;House hold activities   How long can you sit comfortably? can do for hours   Diagnostic tests CT scan, doppler   Patient Stated Goals to get rid of this feeling   Currently in Pain? Yes   Pain Score 5    Pain Location Neck  with movement   Pain Orientation Left   Pain Descriptors / Indicators Tightness   Pain Type Chronic pain   Pain Frequency Constant   Aggravating Factors  turning head   Pain Relieving Factors massage, heat pad, tries not to take pain meds.    Multiple Pain Sites Yes   Pain Score 5   Pain Type Neuropathic pain   Pain Location Face   Pain Orientation Right   Pain Radiating  Towards Rt. UE   Pain Descriptors / Indicators Discomfort   Pain Frequency Intermittent   Pain Onset On-going          [facility] PT Assessment - 06/28/14 0916    Assessment   Medical Diagnosis Acute neck pain   Onset Date --  patient states she has had for years   Precautions   Precautions None   Restrictions   Weight Bearing Restrictions No   Balance Screen   Has the patient fallen in the past 6 months No   Observation/Other Assessments   Focus on Therapeutic Outcomes (FOTO)  44%   Sensation   Light Touch Appears Intact   Additional Comments tested UE and face   Posture/Postural Control   Posture Comments slightly lower R side of face, forward head and shoulders elevated.    AROM   Cervical Flexion 70   Cervical Extension 42   Cervical - Right Side Bend 35  pain on L   Cervical - Left Side Bend 40   Cervical - Right Rotation 60   Cervical - Left Rotation 60  pain   Strength   Right Elbow Flexion 5/5   Right Elbow Extension 4/5   Left Elbow Flexion 4-/5   Left  Elbow Extension 4-/5   Palpation   Palpation hypertonic throughout upper traps and cervicals bilaterally.    Spurling's   Findings Negative   Side --  bilat   Distraction Test   Findngs Negative   side --  bilateral          PT Education - 06/28/14 1324    Education provided Yes   Education Details PT/POC, trigger point, referral for neurology?   Person(s) Educated Patient   Methods Explanation   Comprehension Verbalized understanding          PT Short Term Goals - 06/28/14 1333    PT SHORT TERM GOAL #1   Title Pt will be I with HEP for cervical/shoulder motion and posture   Time 2   Period Weeks   Status New   PT SHORT TERM GOAL #2   Title Pt will report  improvement in Rt. UE symptoms by 15-20%   Time 4   Period Weeks   Status New   PT SHORT TERM GOAL #3   Title Pt will have no difficulty lifting 10 lbs to shoulder height and no increased neck pain   Time 2   Period Weeks   Status  New           PT Long Term Goals - 06/28/14 1335    PT LONG TERM GOAL #1   Title Pt will be I with advanced HEP and perform daily.    Time 6   Period Weeks   Status New   PT LONG TERM GOAL #2   Title Pt. will be able to turn head to drive and have no pain increase.    Time 6   Period Weeks   Status New   PT LONG TERM GOAL #3   Title Pt will demo UE strength to 4+/5 throughout    Time 6   Period Weeks   Status New   PT LONG TERM GOAL #4   Title Pt. will have Rt UE symptoms occasionally and min intensity.    Time 6   Period Weeks   Status New   PT LONG TERM GOAL #5   Title Pt will begin walking program for improved health and overall mobility.    Time 6   Period Weeks   Status New          Plan - 06/28/14 1325    Clinical Impression Statement Patient presents with Lt. sided neck pain and changes in sensation in face, Rt. UE.  She was recently in the ED for elevated BP along with Rt. UE/face sx.  She was urged to continue to self monitor for unusual sx.  She will  benefit from skilled PT to improve ROM in neck and reduce pain, sx .  She may benefit from Neurology consult if sx in face,  mouth do not change or worsen.     Pt will benefit from skilled therapeutic intervention in order to improve on the following deficits Decreased range of motion;Impaired flexibility;Postural dysfunction;Impaired sensation;Increased fascial restricitons;Pain;Impaired UE functional use;Increased muscle spasms;Decreased mobility;Decreased strength   Rehab Potential Good   PT Frequency 2x / week   PT Duration 6 weeks   PT Treatment/Interventions ADLs/Self Care Home Management;Moist Heat;Therapeutic activities;Patient/family education;Passive range of motion;Therapeutic exercise;Traction;Ultrasound;Gait training;Manual techniques;Cryotherapy;Neuromuscular re-education;Functional mobility training;Electrical Stimulation   PT Next Visit Plan give initial HEP   PT Home Exercise Plan none     Consulted and Agree with Plan of Care Patient  G-Codes - 06/28/14 1339    Functional Assessment Tool Used FOTO   Functional Limitation Carrying, moving and handling objects   Carrying, Moving and Handling Objects Current Status [phone]) At least 40 percent but less than 60 percent impaired, limited or restricted   Carrying, Moving and Handling Objects Goal Status (H2122) At least 20 percent but less than 40 percent impaired, limited or restricted       Problem List Patient Active Problem List   Diagnosis Date Noted  . GERD (gastroesophageal reflux disease)   . Shortness of Breath 07/30/2007  . COUGH 07/30/2007    PAA,JENNIFER 06/28/2014, 2:37 PM  [facility] [address] [place], [name], [address] Phone: [phone]   Fax:  [phone]

## 2014-06-28 NOTE — Telephone Encounter (Signed)
appts made and printed...td 

## 2014-06-29 ENCOUNTER — Ambulatory Visit: Payer: Medicare Other | Admitting: Physical Therapy

## 2014-06-29 DIAGNOSIS — M542 Cervicalgia: Principal | ICD-10-CM

## 2014-06-29 DIAGNOSIS — Z6824 Body mass index (BMI) 24.0-24.9, adult: Secondary | ICD-10-CM | POA: Diagnosis not present

## 2014-06-29 DIAGNOSIS — M25519 Pain in unspecified shoulder: Secondary | ICD-10-CM | POA: Diagnosis not present

## 2014-06-29 DIAGNOSIS — R208 Other disturbances of skin sensation: Secondary | ICD-10-CM | POA: Diagnosis not present

## 2014-06-29 DIAGNOSIS — R202 Paresthesia of skin: Secondary | ICD-10-CM | POA: Diagnosis not present

## 2014-06-29 DIAGNOSIS — R209 Unspecified disturbances of skin sensation: Secondary | ICD-10-CM | POA: Diagnosis not present

## 2014-06-29 NOTE — Therapy (Signed)
[place], [name], [address] Phone: [phone]   Fax:  [phone]  Physical Therapy Treatment  Patient Details  Name: Sara Brown MRN: [id number] Date of Birth: Nov 05, 1940 Referring Provider:  Precious Reel, MD  Encounter Date: 06/29/2014      PT End of Session - 06/29/14 0942    Visit Number 2   Number of Visits 12   Date for PT Re-Evaluation 08/09/14   PT Start Time 0840   PT Stop Time 0942   PT Time Calculation (min) 62 min   Activity Tolerance Patient tolerated treatment well      Past Medical History  Diagnosis Date  . Hyperlipidemia   . Hypertension   . GERD (gastroesophageal reflux disease)     Past Surgical History  Procedure Laterality Date  . Cesarean section    . Cesarean section      There were no vitals taken for this visit.  Visit Diagnosis:  Neck and shoulder pain      Subjective Assessment - 06/29/14 1041    Symptoms Denies pain.  Reports having a "sensation" Rt side down arm  intermittantly.     Currently in Pain? No/denies   Pain Orientation Right;Left   Pain Descriptors / Indicators --  sensation.  Rt, Lt tight   Aggravating Factors  Sitting at computer for hours with poor posture   Pain Relieving Factors manual, heat          [facility] PT Assessment - 06/28/14 0916    Assessment   Medical Diagnosis Acute neck pain   Onset Date --  patient states she has had for years   Precautions   Precautions None   Restrictions   Weight Bearing Restrictions No   Balance Screen   Has the patient fallen in the past 6 months No   Observation/Other Assessments   Focus on Therapeutic Outcomes (FOTO)  44%   Sensation   Light Touch Appears Intact   Additional Comments tested UE and face   Posture/Postural Control   Posture Comments slightly lower R side of face, forward head and shoulders elevated.    AROM   Cervical Flexion 70   Cervical Extension 42   Cervical  - Right Side Bend 35  pain on L   Cervical - Left Side Bend 40   Cervical - Right Rotation 60   Cervical - Left Rotation 60  pain   Strength   Right Elbow Flexion 5/5   Right Elbow Extension 4/5   Left Elbow Flexion 4-/5   Left Elbow Extension 4-/5   Palpation   Palpation hypertonic throughout upper traps and cervicals bilaterally.    Spurling's   Findings Negative   Side --  bilat   Distraction Test   Findngs Negative   side --  bilateral                  [facility] Adult PT Treatment/Exercise - 06/29/14 0001    Posture/Postural Control   Posture Comments Handouts issued for work station and discussed at [employer].  ADL handout issued and not yet discussed..  Patient was interested in the information.   Modalities   Modalities Moist Heat   Moist Heat Therapy   Number Minutes Moist Heat 15 Minutes   Moist Heat Location --  neck only while sitting   Manual Therapy   Manual Therapy --  seated   Myofascial Release upper back   Other Manual Therapy neuromuscular trigger  point release. to neck Lt> RT pec stretches, Muscular strumming to help lengthen  neck muscles  Lt.                PT Education - 06/29/14 0940    Education provided Yes   Education Details sitting posture for work station, ADH posture handout.   Person(s) Educated Patient   Methods Explanation;Handout   Comprehension Verbalized understanding          PT Short Term Goals - 06/29/14 1054    PT SHORT TERM GOAL #2   Title Pt will report  improvement in Rt. UE symptoms by 15-20%   Time 4   Period Weeks   Status On-going   PT SHORT TERM GOAL #3   Title Pt will have no difficulty lifting 10 lbs to shoulder height and no increased neck pain   Time 2   Period Weeks   Status Unable to assess           PT Long Term Goals - 07/09/14 1335    PT LONG TERM GOAL #1   Title Pt will be I with advanced HEP and perform daily.    Time 6   Period Weeks   Status New   PT LONG TERM GOAL #2    Title Pt. will be able to turn head to drive and have no pain increase.    Time 6   Period Weeks   Status New   PT LONG TERM GOAL #3   Title Pt will demo UE strength to 4+/5 throughout    Time 6   Period Weeks   Status New   PT LONG TERM GOAL #4   Title Pt. will have Rt UE symptoms occasionally and min intensity.    Time 6   Period Weeks   Status New   PT LONG TERM GOAL #5   Title Pt will begin walking program for improved health and overall mobility.    Time 6   Period Weeks   Status New               Plan - 06/29/14 1051    Clinical Impression Statement Focus today on education for posture, manual work to soften  then gently stretch neck and upper back.  Progress made toward Range and ADL goals.   PT Next Visit Plan Neck AROM  Did not issue today.  Answer and posture questions.          G-Codes - 07/09/14 1339    Functional Assessment Tool Used FOTO   Functional Limitation Carrying, moving and handling objects   Carrying, Moving and Handling Objects Current Status [phone]) At least 40 percent but less than 60 percent impaired, limited or restricted   Carrying, Moving and Handling Objects Goal Status ([id number]) At least 20 percent but less than 40 percent impaired, limited or restricted      Problem List Patient Active Problem List   Diagnosis Date Noted  . GERD (gastroesophageal reflux disease)   . Shortness of Breath 07/30/2007  . COUGH 07/30/2007  Melvenia Needles, PTA 06/29/2014 10:55 AM Phone: [phone] Fax: [phone]   [facility] 06/29/2014, 10:55 AM  [facility] [address] [place], [name], [address] Phone: [phone]   Fax:  [phone]

## 2014-06-29 NOTE — Patient Instructions (Signed)

## 2014-06-30 ENCOUNTER — Encounter: Payer: Medicare Other | Admitting: Physical Therapy

## 2014-07-05 ENCOUNTER — Ambulatory Visit: Payer: Medicare Other | Admitting: Physical Therapy

## 2014-07-05 DIAGNOSIS — M542 Cervicalgia: Principal | ICD-10-CM

## 2014-07-05 DIAGNOSIS — R209 Unspecified disturbances of skin sensation: Secondary | ICD-10-CM

## 2014-07-05 DIAGNOSIS — R208 Other disturbances of skin sensation: Secondary | ICD-10-CM | POA: Diagnosis not present

## 2014-07-05 DIAGNOSIS — M25519 Pain in unspecified shoulder: Secondary | ICD-10-CM

## 2014-07-05 NOTE — Therapy (Signed)
[place], [name], [address] Phone: [phone]   Fax:  [phone]  Physical Therapy Treatment  Patient Details  Name: Sara Brown MRN: [id number] Date of Birth: 1940/10/12 Referring Provider:  Precious Reel, MD  Encounter Date: 07/05/2014      PT End of Session - 07/05/14 1018    Visit Number 3   Number of Visits 12   Date for PT Re-Evaluation 08/09/14   PT Start Time 0930   PT Stop Time 1028   PT Time Calculation (min) 58 min   Activity Tolerance Patient tolerated treatment well      Past Medical History  Diagnosis Date  . Hyperlipidemia   . Hypertension   . GERD (gastroesophageal reflux disease)     Past Surgical History  Procedure Laterality Date  . Cesarean section    . Cesarean section      There were no vitals taken for this visit.  Visit Diagnosis:  Neck and shoulder pain  Sensory disturbance      Subjective Assessment - 07/05/14 0938    Symptoms Pain is 2/10 today in L neck and Rt. UE symptoms, face.  Was worse yesterday.    Currently in Pain? Yes   Pain Score 2           [facility] PT Assessment - 07/05/14 0941    AROM   Cervical Flexion 75   Cervical Extension 50   Cervical - Right Side Bend 35   Cervical - Left Side Bend 38   Cervical - Right Rotation 60  pain   Cervical - Left Rotation 60  pain                  [facility] Adult PT Treatment/Exercise - 07/05/14 0947    Neck Exercises: Stretches   Upper Trapezius Stretch 2 reps;30 seconds   Levator Stretch 2 reps;30 seconds   Neck Stretch 5 reps;10 seconds   Neck Stretch Limitations chin tuck against wall   Corner Stretch 2 reps;30 seconds   Neck Exercises: Machines for Strengthening   UBE (Upper Arm Bike) 5 min level 1, 2.5 min FW and 2/5 min back   Moist Heat Therapy   Number Minutes Moist Heat 15 Minutes   Moist Heat Location --  neck only while sitting   Manual Therapy   Manual Therapy --   seated   Myofascial Release upper traps, levator scap   Other Manual Therapy trigger point work to L/R muscular knots in upper traps                PT Education - 07/05/14 1018    Education provided Yes   Education Details HEP, posture and massage   Person(s) Educated Patient   Methods Explanation;Handout   Comprehension Verbalized understanding;Need further instruction;Returned demonstration          PT Short Term Goals - 07/05/14 1023    PT SHORT TERM GOAL #1   Title Pt will be I with HEP for cervical/shoulder motion and posture   Status On-going   PT SHORT TERM GOAL #2   Title Pt will report  improvement in Rt. UE symptoms by 15-20%   Status On-going   PT SHORT TERM GOAL #3   Title Pt will have no difficulty lifting 10 lbs to shoulder height and no increased neck pain   Status On-going           PT Long Term Goals - 07/05/14 1023  PT LONG TERM GOAL #1   Title Pt will be I with advanced HEP and perform daily.    Status On-going   PT LONG TERM GOAL #2   Title Pt. will be able to turn head to drive and have no pain increase.    Status On-going   PT LONG TERM GOAL #3   Title Pt will demo UE strength to 4+/5 throughout    Status On-going   PT LONG TERM GOAL #4   Title Pt. will have Rt UE symptoms occasionally and min intensity.    Status On-going   PT LONG TERM GOAL #5   Title Pt will begin walking program for improved health and overall mobility.    Status On-going               Plan - 07/05/14 1019    Clinical Impression Statement Patient reports doing general stretching but had no HEP thus far, worked today on establishing HEP and explaining had position as it pertains to muscular imbalance.  CROM is WNL.  She tolerated well.   Uses heat at home.     PT Next Visit Plan Check HEP and progress, cont with manual and MHP   PT Home Exercise Plan levator scap, UT stretch, chin tuck, scap retraction   Consulted and Agree with Plan of Care Patient         Problem List Patient Active Problem List   Diagnosis Date Noted  . GERD (gastroesophageal reflux disease)   . Shortness of Breath 07/30/2007  . COUGH 07/30/2007    Jolisa Intriago 07/05/2014, 10:27 AM  [facility] [address] [place], [name], [address] Phone: [phone]   Fax:  [phone]

## 2014-07-05 NOTE — Patient Instructions (Signed)
Levator Stretch   Grasp seat or sit on hand on side to be stretched. Turn head toward other side and look down. Use hand on head to gently stretch neck in that position. Hold _30__ seconds. Repeat on other side. Repeat ___2-3_ times. Do __2__ sessions per day.  http://gt2.exer.us/30   Copyright  VHI. All rights reserved.  Side-Bending   One hand on opposite side of head, pull head to side as far as is comfortable. Stop if there is pain. Hold ___30_ seconds. Repeat with other hand to other side. Repeat ___2-3_ times. Do _2___ sessions per day.   Copyright  VHI. All rights reserved.  Scapular Retraction (Standing)   With arms at sides, pinch shoulder blades together. Repeat __10__ times per set. Do _1-2__ sets per session. Do __1-2__ sessions per day.  http://orth.exer.us/944   Head Press With Chin Tuck   Tuck chin SLIGHTLY toward chest, keep mouth closed. Feel weight on back of head. Increase weight by pressing head down. Hold _5-10__ seconds. Relax. Repeat ___ times. Surface: floor   Copyright  VHI. All rights reserved.

## 2014-07-07 ENCOUNTER — Ambulatory Visit: Payer: Medicare Other | Admitting: Physical Therapy

## 2014-07-07 DIAGNOSIS — M25519 Pain in unspecified shoulder: Secondary | ICD-10-CM | POA: Diagnosis not present

## 2014-07-07 DIAGNOSIS — M542 Cervicalgia: Principal | ICD-10-CM

## 2014-07-07 DIAGNOSIS — R208 Other disturbances of skin sensation: Secondary | ICD-10-CM | POA: Diagnosis not present

## 2014-07-07 NOTE — Therapy (Signed)
[place], [name], [address] Phone: [phone]   Fax:  [phone]  Physical Therapy Treatment  Patient Details  Name: Sara Brown MRN: [id number] Date of Birth: 31-Dec-1940 Referring Provider:  Precious Reel, MD  Encounter Date: 07/07/2014      PT End of Session - 07/07/14 1439    Visit Number 4   Number of Visits 12   Date for PT Re-Evaluation 08/09/14   PT Start Time 1324   PT Stop Time 1112   PT Time Calculation (min) 57 min   Activity Tolerance Patient tolerated treatment well      Past Medical History  Diagnosis Date  . Hyperlipidemia   . Hypertension   . GERD (gastroesophageal reflux disease)     Past Surgical History  Procedure Laterality Date  . Cesarean section    . Cesarean section      There were no vitals taken for this visit.  Visit Diagnosis:  Neck and shoulder pain      Subjective Assessment - 07/07/14 1028    Symptoms (p) 1/10 now.  Pain improving.  Doing her home exercises  No arm pain reported   Pain Score (p) 1                     [facility] Adult PT Treatment/Exercise - 07/07/14 1020    Posture/Postural Control   Posture/Postural Control --  discussed and practiced good standing posture   Neck Exercises: Stretches   Upper Trapezius Stretch --  3 reps, 30 reconds   Levator Stretch --  3 reps, 30 seconds   Neck Stretch Limitations --  5 reps 10 seconds, sitting   Corner Stretch --  doorwat, 30 seconds, 3 reps   Neck Exercises: Standing   Other Standing Exercises scapular retraction, straight and bent arms.   Moist Heat Therapy   Number Minutes Moist Heat 15 Minutes   Moist Heat Location --  neck   Manual Therapy   Manual Therapy --  soft tissue work.  neck and upper back, prone.  Lt levator w                  PT Short Term Goals - 07/07/14 1443    PT SHORT TERM GOAL #1   Title Pt will be I with HEP for cervical/shoulder  motion and posture   Time 2   Period Weeks   Status Achieved   PT SHORT TERM GOAL #2   Title Pt will report  improvement in Rt. UE symptoms by 15-20%   Time 4   Period Weeks   Status Achieved   PT SHORT TERM GOAL #3   Title Pt will have no difficulty lifting 10 lbs to shoulder height and no increased neck pain   Time 2   Period Weeks   Status Unable to assess           PT Long Term Goals - 07/07/14 1443    PT LONG TERM GOAL #1   Title Pt will be I with advanced HEP and perform daily.    Time 6   Period Weeks   Status On-going   PT LONG TERM GOAL #2   Title Pt. will be able to turn head to drive and have no pain increase.    Time 6   Period Weeks   Status On-going   PT LONG TERM GOAL #3   Title Pt will demo UE strength to  4+/5 throughout    Time 6   Period Weeks   Status Unable to assess   PT LONG TERM GOAL #4   Title Pt. will have Rt UE symptoms occasionally and min intensity.    Time 6   Period Weeks   Status On-going               Plan - 07/07/14 1440    Clinical Impression Statement Home exercise reviewed, a few cues needed. Manual and heat helpful.  Pain decreased.   PT Next Visit Plan stabilization 2?  cervical, ball on the wall, Nustep or arm bike.        Problem List Patient Active Problem List   Diagnosis Date Noted  . GERD (gastroesophageal reflux disease)   . Shortness of Breath 07/30/2007  . COUGH 07/30/2007   Melvenia Needles, PTA 07/07/2014 2:46 PM Phone: [phone] Fax: [phone]  [facility] 07/07/2014, 2:46 PM  [facility] [address] [place], [name], [address] Phone: [phone]   Fax:  [phone]

## 2014-07-12 ENCOUNTER — Ambulatory Visit: Payer: Medicare Other | Attending: Internal Medicine | Admitting: Physical Therapy

## 2014-07-12 DIAGNOSIS — M542 Cervicalgia: Secondary | ICD-10-CM | POA: Insufficient documentation

## 2014-07-12 DIAGNOSIS — R208 Other disturbances of skin sensation: Secondary | ICD-10-CM | POA: Insufficient documentation

## 2014-07-12 DIAGNOSIS — M25519 Pain in unspecified shoulder: Secondary | ICD-10-CM | POA: Diagnosis not present

## 2014-07-12 NOTE — Therapy (Signed)
[place], [name], [address] Phone: [phone]   Fax:  [phone]  Physical Therapy Treatment  Patient Details  Name: Sara Brown MRN: [id number] Date of Birth: [birth year] Referring Provider:  Precious Reel, MD  Encounter Date: 07/12/2014      PT End of Session - 07/12/14 1309    Visit Number 5   Number of Visits 12   Date for PT Re-Evaluation 08/09/14   PT Start Time 0930   PT Stop Time 1030   PT Time Calculation (min) 60 min   Activity Tolerance Patient tolerated treatment well      Past Medical History  Diagnosis Date  . Hyperlipidemia   . Hypertension   . GERD (gastroesophageal reflux disease)     Past Surgical History  Procedure Laterality Date  . Cesarean section    . Cesarean section      There were no vitals taken for this visit.  Visit Diagnosis:  Neck and shoulder pain      Subjective Assessment - 07/12/14 0946    Symptoms Lt neck intermittantly.  Uses good posture with reading but is a challange to hold arms up to read  Able to drive without pain   Currently in Pain? No/denies   Pain Orientation Left;Right   Pain Descriptors / Indicators --  pulling   Pain Type Chronic pain   Pain Frequency Intermittent                    [facility] Adult PT Treatment/Exercise - 07/12/14 0930    Neck Exercises: Stretches   Levator Stretch 2 reps;10 seconds  Lt   Neck Exercises: Machines for Strengthening   UBE (Upper Arm Bike) 2 minutes 1.1 level   Cybex Row 7 Lbs row with instruction   Other Machines for Strengthening Lat pull down 1 1/2 plate min assist, standing with cues. 10 reps    Neck Exercises: Standing   Other Standing Exercises 10 Lbs lifting practice able to do without pain   Moist Heat Therapy   Number Minutes Moist Heat 15 Minutes   Moist Heat Location --  neck while prone   Manual Therapy   Manual Therapy --  soft tissue work.  neck and upper back,  prone.  Lt levator w   Myofascial Release Lt upper traps   Other Manual Therapy neuromuscular trigger point release  tissue softenred                PT Education - 07/12/14 1308    Education provided Yes   Education Details how to lift   Person(s) Educated Patient   Methods Explanation;Demonstration;Tactile cues   Comprehension Verbalized understanding;Returned demonstration          PT Short Term Goals - 07/12/14 1312    PT SHORT TERM GOAL #3   Title Pt will have no difficulty lifting 10 lbs to shoulder height and no increased neck pain   Time 2   Period Weeks   Status Achieved           PT Long Term Goals - 07/12/14 1312    PT LONG TERM GOAL #1   Title Pt will be I with advanced HEP and perform daily.    Time 6   Period Weeks   Status On-going   PT LONG TERM GOAL #2   Title Pt. will be able to turn head to drive and have no pain increase.    Time  6   Period Weeks   Status Achieved   PT LONG TERM GOAL #3   Title Pt will demo UE strength to 4+/5 throughout    Time 6   Period Weeks   Status Achieved   PT LONG TERM GOAL #4   Title Pt. will have Rt UE symptoms occasionally and min intensity.    Time 6   Period Weeks   Status On-going               Plan - 07/12/14 1310    Clinical Impression Statement able to progress with upper back strengthening.  Lifting goal met.  Strength goal met.     PT Next Visit Plan continue machines, neck stretches.  Add walking program to home        Problem List Patient Active Problem List   Diagnosis Date Noted  . GERD (gastroesophageal reflux disease)   . Shortness of Breath 07/30/2007  . COUGH 07/30/2007   Melvenia Needles, PTA 07/12/2014 1:19 PM Phone: [phone] Fax: [phone]  [facility] 07/12/2014, 1:19 PM  [facility] [address] [place], [name], [address] Phone: [phone]   Fax:  [phone]

## 2014-07-14 ENCOUNTER — Ambulatory Visit: Payer: Medicare Other

## 2014-07-14 DIAGNOSIS — M25519 Pain in unspecified shoulder: Secondary | ICD-10-CM | POA: Diagnosis not present

## 2014-07-14 DIAGNOSIS — M542 Cervicalgia: Principal | ICD-10-CM

## 2014-07-14 DIAGNOSIS — R208 Other disturbances of skin sensation: Secondary | ICD-10-CM | POA: Diagnosis not present

## 2014-07-14 DIAGNOSIS — R209 Unspecified disturbances of skin sensation: Secondary | ICD-10-CM

## 2014-07-14 NOTE — Therapy (Signed)
[name], [name], [address] Phone: [phone]   Fax:  [phone]  Physical Therapy Treatment  Patient Details  Name: Sara Brown MRN: [id number] Date of Birth: 1941/04/15 Referring Provider:  Precious Reel, MD  Encounter Date: 07/14/2014      PT End of Session - 07/14/14 1055    Visit Number 6   Number of Visits 12   Date for PT Re-Evaluation 08/09/14   PT Start Time 1022   PT Stop Time 1100   PT Time Calculation (min) 38 min   Activity Tolerance Patient tolerated treatment well   Behavior During Therapy [facility] for tasks assessed/performed      Past Medical History  Diagnosis Date  . Hyperlipidemia   . Hypertension   . GERD (gastroesophageal reflux disease)     Past Surgical History  Procedure Laterality Date  . Cesarean section    . Cesarean section      There were no vitals taken for this visit.  Visit Diagnosis:  Neck and shoulder pain  Sensory disturbance      Subjective Assessment - 07/14/14 1024    Symptoms Discomfort wiht doing UBE.                     [place] Adult PT Treatment/Exercise - 07/14/14 1022    Neck Exercises: Stretches   Levator Stretch 2 reps;30 seconds   Neck Stretch 2 reps;30 seconds   Neck Stretch Limitations chin tuck    Other Neck Stretches Instructed in Mob with movement with pressure to lateral neck to assist rotation    Neck Exercises: Machines for Strengthening   UBE (Upper Arm Bike) 3 minutes 1.1 level   Cybex Row 10 pounds x 15 LT   Neck Exercises: Theraband   Shoulder External Rotation 15 reps;Red   Shoulder External Rotation Limitations cued for good posture   Horizontal ABduction 15 reps;Red   Horizontal ABduction Limitations cued for posture   Neck Exercises: Standing   Neck Retraction 10 reps   Neck Retraction Limitations taclile and verbal cues for pposture                PT Education - 07/14/14 1054     Education provided Yes   Education Details SNAGs   Person(s) Educated Patient   Methods Explanation;Demonstration;Tactile cues;Verbal cues;Handout   Comprehension Verbalized understanding;Returned demonstration          PT Short Term Goals - 07/12/14 1312    PT SHORT TERM GOAL #3   Title Pt will have no difficulty lifting 10 lbs to shoulder height and no increased neck pain   Time 2   Period Weeks   Status Achieved           PT Long Term Goals - 07/12/14 1312    PT LONG TERM GOAL #1   Title Pt will be I with advanced HEP and perform daily.    Time 6   Period Weeks   Status On-going   PT LONG TERM GOAL #2   Title Pt. will be able to turn head to drive and have no pain increase.    Time 6   Period Weeks   Status Achieved   PT LONG TERM GOAL #3   Title Pt will demo UE strength to 4+/5 throughout    Time 6   Period Weeks   Status Achieved   PT LONG TERM GOAL #4   Title Pt. will have Rt  UE symptoms occasionally and min intensity.    Time 6   Period Weeks   Status On-going               Plan - 07/14/14 1056    Clinical Impression Statement PAin iimproved Cont stiffness. She would like to work on this   Pt will benefit from skilled therapeutic intervention in order to improve on the following deficits Decreased range of motion;Impaired flexibility;Postural dysfunction;Impaired sensation;Increased fascial restricitons;Pain;Impaired UE functional use;Increased muscle spasms;Decreased mobility;Decreased strength   Rehab Potential Good   PT Frequency 2x / week   PT Duration 2 weeks   PT Treatment/Interventions ADLs/Self Care Home Management;Moist Heat;Therapeutic activities;Patient/family education;Passive range of motion;Therapeutic exercise;Traction;Ultrasound;Gait training;Manual techniques;Cryotherapy;Neuromuscular re-education;Functional mobility training;Electrical Stimulation   PT Next Visit Plan continue machines, neck stretches.  Add walking program to home    PT Home Exercise Plan levator scap, UT stretch, chin tuck, scap retraction   Consulted and Agree with Plan of Care Patient        Problem List Patient Active Problem List   Diagnosis Date Noted  . GERD (gastroesophageal reflux disease)   . Shortness of Breath 07/30/2007  . COUGH 07/30/2007    Darrel Hoover PT 07/14/2014, 10:58 AM  [facility] [address] [place], [name], [address] Phone: [phone]   Fax:  [phone]

## 2014-07-14 NOTE — Patient Instructions (Signed)
Mob with movement SNAGS RT and LT with hand and towel. 2x/day 5-6 rerps

## 2014-07-15 ENCOUNTER — Encounter: Payer: Self-pay | Admitting: Neurology

## 2014-07-15 ENCOUNTER — Ambulatory Visit (INDEPENDENT_AMBULATORY_CARE_PROVIDER_SITE_OTHER): Payer: Medicare Other | Admitting: Neurology

## 2014-07-15 VITALS — BP 125/71 | HR 92 | Temp 97.7°F | Ht 67.0 in | Wt 162.0 lb

## 2014-07-15 DIAGNOSIS — R202 Paresthesia of skin: Secondary | ICD-10-CM | POA: Diagnosis not present

## 2014-07-15 NOTE — Progress Notes (Signed)
Subjective:    Patient ID: Sara Brown is a 74 y.o. female.  HPI      Star Age, MD, PhD [facility] [address] [place], [place] [address]  Dear Dr. Virgina Jock,   I saw your patient, Sara Brown, upon your kind request in my neurologic clinic today for initial consultation of her paresthesias. The patient is unaccompanied today. 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 She was told she may have a right facial droop when she saw physical therapy.  She is currently in outpatient physical therapy. She has had intermittent problems with abnormal sensation in her right face and arm for years. She may go days without symptoms. She wonders if this is a stress-related reaction. She was rushing to get to this appointment and while driving she had a sensation of tingling that started in her right mid face and went to her right shoulder. She continues to have very mild symptoms currently. Very occasionally will she have symptoms on the left side. She has no left or right lower body symptoms. Strength-wise sometimes she feels a little weak during the episode which can last minutes to hours. It is usually not just seconds. She does not lose consciousness or awareness. She has never noted any abnormal involuntary twitching. She has not noted any muscle wasting. Symptoms started years ago. She had a recent head CT and remote MRI brain. She never had an EEG. There is no overt triggers or alleviating factors. She does not have any associated headaches with  this but certainly does report occasional left parietal headaches which are not debilitating.  She had a CT head without contrast on 05/27/2014: 1. No acute intracranial findings. 2. Mild microvascular disease. In addition, personally reviewed the images through the PACS system.  She had 2D echo on 10/18/13: this was unremarkable with EF 60-65%.   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 No other significant ischemic changes are noted. The enhancement pattern is normal and there is no mass lesion. No intracranial hemorrhage is identified.   In addition, personally reviewed the images through the PACS system. She had a MRA head without contrast and MRA neck with contrast on 03/09/2004 because of dizziness reported: MR angiography of the intracranial circulation shows no significant proximal stenosis or intracranial atherosclerotic change.  Advanced dolichoectasia of the craniocervical vessels without significant proximal stenosis. She had a BMP on 06/15/2014: This was unremarkable.  Her Past Medical History Is Significant For: Past Medical History  Diagnosis Date  . Hyperlipidemia   . Hypertension   . GERD (gastroesophageal reflux disease)   . Allergic rhinitis   . Osteoporosis   . OA (osteoarthritis)   . Asthma   . Vitamin D deficiency   . Carotid stenosis     ICA(L)  . Neuropathy     face (R)  . Constipation     Her Past Surgical History Is Significant For: Past Surgical History  Procedure Laterality Date  . Cesarean section    .  Cesarean section      Her Family History Is Significant For: Family History  Problem Relation Age of Onset  . Diabetes Mellitus II Father   . Diabetes Mellitus II Mother   . Osteoporosis Mother   . Hypertension Mother   . CVA Sister     4  . CAD Sister     4  . Hypertension Sister     4  . Osteoarthritis Sister     4   . Diabetes Mellitus II Sister     4  . Dementia Sister     decreased  . Alcoholism Brother     Her Social History Is Significant For: History   Social History  . Marital Status: Married    Spouse Name: Bland Span    Number of Children: 2  . Years of Education: 16   Occupational History  .      employed at home   Social History Main Topics  . Smoking status: Never Smoker   . Smokeless tobacco: Never Used  . Alcohol Use: No  . Drug Use: No  . Sexual Activity: None   Other Topics Concern  . None   Social History Narrative   Consumes no caffeine    Her Allergies Are:  Allergies  Allergen Reactions  . Naproxen     Patient preference   . Prednisone     Patient Preference   :   Her Current Medications Are:  Outpatient Encounter Prescriptions as of 07/15/2014  Medication Sig  . AMLODIPINE BESYLATE PO Take 10 mg by mouth daily.  Marland Kitchen aspirin 81 MG tablet Take 81 mg by mouth daily.  . budesonide-formoterol (SYMBICORT) 160-4.5 MCG/ACT inhaler Inhale 2 puffs into the lungs 2 (two) times daily. As needed  . carboxymethylcellulose (REFRESH PLUS) 0.5 % SOLN 1 drop daily. As directed  . Cholecalciferol (VITAMIN D) 400 UNITS capsule Take 400 Units by mouth daily.  . Colesevelam HCl (WELCHOL PO) Take 2 tablets by mouth 2 (two) times daily.   . cyclobenzaprine (FLEXERIL) 10 MG tablet Take 1 tablet (10 mg total) by mouth 2 (two) times daily as needed for muscle spasms.  . cycloSPORINE (RESTASIS) 0.05 % ophthalmic emulsion Place 1 drop into both eyes 2 (two) times daily. 1gtt OU bid  . fish oil-omega-3 fatty acids 1000 MG capsule Take 1 g by mouth daily.  . fluticasone (FLONASE) 50 MCG/ACT nasal spray Place 2 sprays into both nostrils daily.  . lansoprazole (PREVACID) 15 MG capsule Take 15 mg by mouth daily.  . Linaclotide (LINZESS) 290 MCG CAPS capsule Take 290 mcg by mouth daily. As needed  . losartan (COZAAR) 25 MG tablet Take 25 mg by mouth daily.  . Multiple Vitamins-Minerals  (MULTIVITAMIN WITH MINERALS) tablet Take 1 tablet by mouth daily.  . polyethylene glycol (MIRALAX / GLYCOLAX) packet Take 17 g by mouth daily as needed for mild constipation.  Marland Kitchen pyridOXINE (VITAMIN B-6) 100 MG tablet Take 100 mg by mouth daily.  Marland Kitchen spironolactone (ALDACTONE) 50 MG tablet Take 50 mg by mouth 2 (two) times daily.  . vitamin B-12 (CYANOCOBALAMIN) 1000 MCG tablet Take 1,000 mcg by mouth daily.  . vitamin E 100 UNIT capsule Take 100 Units by mouth daily.  . [DISCONTINUED] amLODipine (NORVASC) 2.5 MG tablet Take 2.5 mg by mouth daily.  . [DISCONTINUED] amLODipine (NORVASC) 10 MG tablet Take 10 mg by mouth daily.  :   Review of Systems:  Out of a complete 14 point review of systems, all are  reviewed and negative with the exception of these symptoms as listed below:  Review of Systems  Musculoskeletal:       Aching muscles  Neurological:       Snoring    Objective:  Neurologic Exam  Physical Exam Physical Examination:   Filed Vitals:   07/15/14 1137  BP: 125/71  Pulse: 92  Temp: 97.7 F (36.5 C)    General Examination: The patient is a very pleasant 74 y.o. female in no acute distress. She appears well-developed and well-nourished and very well groomed.   HEENT: Normocephalic, atraumatic, pupils are equal, round and reactive to light and accommodation. Funduscopic exam is normal with sharp disc margins noted. Extraocular tracking is good without limitation to gaze excursion or nystagmus noted. Normal smooth pursuit is noted. Hearing is grossly intact. Tympanic membranes are clear bilaterally. Face is symmetric with normal facial animation and normal facial sensation. Speech is clear with no dysarthria noted. There is no hypophonia. There is no lip, neck/head, jaw or voice tremor. Neck is supple with full range of passive and active motion. There are no carotid bruits on auscultation. Oropharynx exam reveals: mild mouth dryness, adequate dental hygiene and mild airway  crowding. Mallampati is class II. Tongue protrudes centrally and palate elevates symmetrically.   Chest: Clear to auscultation without wheezing, rhonchi or crackles noted.  Heart: S1+S2+0, regular and normal without murmurs, rubs or gallops noted.   Abdomen: Soft, non-tender and non-distended with normal bowel sounds appreciated on auscultation.  Extremities: There is no pitting edema in the distal lower extremities bilaterally. Pedal pulses are intact.  Skin: Warm and dry without trophic changes noted. There are no varicose veins.  Musculoskeletal: exam reveals no obvious joint deformities, tenderness or joint swelling or erythema.   Neurologically:  Mental status: The patient is awake, alert and oriented in all 4 spheres. Her immediate and remote memory, attention, language skills and fund of knowledge are appropriate. There is no evidence of aphasia, agnosia, apraxia or anomia. Speech is clear with normal prosody and enunciation. Thought process is linear. Mood is normal and affect is normal.  Cranial nerves II - XII are as described above under HEENT exam. In addition: shoulder shrug is normal with equal shoulder height noted. Motor exam: Normal bulk, strength and tone is noted. There is no drift, tremor or rebound. Romberg is negative. Reflexes are 2+ throughout. Babinski: Toes are flexor bilaterally. Fine motor skills and coordination: intact with normal finger taps, normal hand movements, normal rapid alternating patting, normal foot taps and normal foot agility.  Cerebellar testing: No dysmetria or intention tremor on finger to nose testing. Heel to shin is unremarkable bilaterally. There is no truncal or gait ataxia.  Sensory exam: intact to light touch, pinprick, vibration, temperature sense in the upper and lower extremities.  Gait, station and balance: She stands easily. No veering to one side is noted. No leaning to one side is noted. Posture is age-appropriate and stance is narrow  based. Gait shows normal stride length and normal pace. No problems turning are noted. She turns en bloc. Tandem walk is unremarkable. Intact toe and heel stance is noted.               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 On examination she has a nonfocal general and neurological exam which is very reassuring. She has had a recent head CT and a brain scan in 2008. She has never had an EEG. It is difficult for me to say what is going on. I explained to her that given that she has retained a normal neurological exam all these years is typically a good sign and makes it unlikely that something sinister has been missed. Nevertheless, I would like to suggest that we proceed with a brain MRI with and without contrast and an EEG at this time. I do not see that we need to do any blood work.  I suggested that she continue staying active and she is currently in physical therapy. She is advised about potential headache triggers. She is wondering if her symptoms are stress-related and I explained to her that stress does indeed cause at times vague neurological symptoms. Nevertheless we will do further workup and we will see her back routinely for this and keep her posted over the phone as far as the test results go. I did not suggest any new medications. I answered all her questions today and she was in agreement. Thank you very much for allowing me to participate in the care of this nice patient. If I can be of any further assistance to you please do not hesitate to call me  at [phone].  Sincerely,   Star Age, MD, PhD

## 2014-07-15 NOTE — Patient Instructions (Signed)
We will do more testing with brain MRI and EEG (brain wave testing).

## 2014-07-19 ENCOUNTER — Ambulatory Visit: Payer: Medicare Other | Admitting: Rehabilitation

## 2014-07-25 ENCOUNTER — Ambulatory Visit: Payer: Medicare Other | Admitting: Rehabilitation

## 2014-07-26 ENCOUNTER — Ambulatory Visit (INDEPENDENT_AMBULATORY_CARE_PROVIDER_SITE_OTHER): Payer: Medicare Other | Admitting: Neurology

## 2014-07-26 DIAGNOSIS — R202 Paresthesia of skin: Secondary | ICD-10-CM | POA: Diagnosis not present

## 2014-07-26 NOTE — Procedures (Signed)
    History:  Sara Brown is a 74 year old patient with a history of multiple episodes of right face and arm numbness and paresthesias, and episodes of facial droop. The patient is being evaluated for these events.  This is a routine EEG. No skull defects are noted. Medications include Norvasc, aspirin, Symbicort, vitamin D, WelChol, Flexeril, fish oil, Flonase, Prevacid, Linzess, Cozaar, multivitamins, MiraLAX, spironolactone, vitamin B12, and vitamin E.   EEG classification: Normal awake  Description of the recording: The background rhythms of this recording consists of a fairly well modulated medium amplitude alpha rhythm of 10 Hz that is reactive to eye opening and closure. As the record progresses, the patient appears to remain in the waking state throughout the recording. Photic stimulation was performed, resulting in a bilateral and symmetric photic driving response. Hyperventilation was also performed, resulting in a minimal buildup of the background rhythm activities without significant slowing seen. At no time during the recording does there appear to be evidence of spike or spike wave discharges or evidence of focal slowing. EKG monitor shows no evidence of cardiac rhythm abnormalities with a heart rate of 68.  Impression: This is a normal EEG recording in the waking state. No evidence of ictal or interictal discharges are seen.

## 2014-07-27 ENCOUNTER — Ambulatory Visit: Payer: Medicare Other | Admitting: Physical Therapy

## 2014-07-27 DIAGNOSIS — R208 Other disturbances of skin sensation: Secondary | ICD-10-CM | POA: Diagnosis not present

## 2014-07-27 DIAGNOSIS — M25519 Pain in unspecified shoulder: Secondary | ICD-10-CM | POA: Diagnosis not present

## 2014-07-27 DIAGNOSIS — M542 Cervicalgia: Principal | ICD-10-CM

## 2014-07-27 NOTE — Therapy (Signed)
[facility] [place], [name], [address] Phone: [phone]   Fax:  [phone]  Physical Therapy Treatment  Patient Details  Name: Sara Brown MRN: [id number] Date of Birth: 1941-04-15 Referring Provider:  Precious Reel, MD  Encounter Date: 07/27/2014      PT End of Session - 07/27/14 1036    Visit Number 7   Number of Visits 12   Date for PT Re-Evaluation 08/09/14   PT Start Time 0932   PT Stop Time 1020   PT Time Calculation (min) 48 min   Activity Tolerance Patient tolerated treatment well      Past Medical History  Diagnosis Date  . Hyperlipidemia   . Hypertension   . GERD (gastroesophageal reflux disease)   . Allergic rhinitis   . Osteoporosis   . OA (osteoarthritis)   . Asthma   . Vitamin D deficiency   . Carotid stenosis     ICA(L)  . Neuropathy     face (R)  . Constipation     Past Surgical History  Procedure Laterality Date  . Cesarean section    . Cesarean section      There were no vitals taken for this visit.  Visit Diagnosis:  Neck and shoulder pain      Subjective Assessment - 07/27/14 0945    Symptoms Doing OK. today, wants to work on the machines.  Less pain sitting because I am more aware of my time on computer.   Currently in Pain? No/denies   Pain Location Shoulder   Pain Orientation Right;Left   Pain Descriptors / Indicators --  exercise sensations   Pain Type Chronic pain;Acute pain   Pain Frequency Intermittent   Aggravating Factors  no reason given, now sits less time at computer.   Pain Relieving Factors less time at computer at a time.   Multiple Pain Sites No  no mention today by patient of other pain when asked                    [facility] Adult PT Treatment/Exercise - 07/27/14 0932    Ambulation/Gait   Gait Comments Walking program added to home exercise, standard.   Neck Exercises: Machines for Strengthening   UBE (Upper Arm Bike) 5  minutes, Level 1.5   Cybex Row 7 LBS, 10 reps, cues   Other Machines for Strengthening cable cross, punches, chops rows with cues   Other Machines for Strengthening Lat pull 2 plates , cues, 10 reps, 2 sets   Neck Exercises: Standing   Wall Push Ups 10 reps   Wall Push Ups Limitations too easy so counter pushups 10 reps cues for form.   Lift / Chop 10 reps  3 LBS on cable cross each, cues   Other Standing Exercises back to wall, moving arms to overhead keeping contact with wall, cues    Ankle Exercises: Aerobic   Elliptical 5 minutes, ramp 1, 5 minutes, needed 6 inch platform step to get on, off, contact guard for safety.   Neck Exercises: Stretches   Upper Trapezius Stretch 3 reps;10 seconds  duration, head position cues given   Levator Stretch 3 reps;20 seconds                PT Education - 07/27/14 1035    Education provided Yes   Education Details Home walking program   Person(s) Educated Patient   Methods Explanation;Handout   Comprehension Verbalized understanding  PT Short Term Goals - 07/12/14 1312    PT SHORT TERM GOAL #3   Title Pt will have no difficulty lifting 10 lbs to shoulder height and no increased neck pain   Time 2   Period Weeks   Status Achieved           PT Long Term Goals - 07/27/14 1038    PT LONG TERM GOAL #1   Title Pt will be I with advanced HEP and perform daily.    Time 6   Period Weeks   Status On-going   PT LONG TERM GOAL #4   Title Pt. will have Rt UE symptoms occasionally and min intensity.    Time 6   Status On-going   PT LONG TERM GOAL #5   Title Pt will begin walking program for improved health and overall mobility.    Time 6   Period Weeks   Status On-going               Plan - 07/27/14 1037    Clinical Impression Statement able to work toward home exercise, and pain goals.   PT Next Visit Plan continue machines, neck stretches.  Check to see how home walking program is going.         Problem List Patient Active Problem List   Diagnosis Date Noted  . GERD (gastroesophageal reflux disease)   . Shortness of Breath 07/30/2007  . COUGH 07/30/2007   Melvenia Needles, PTA 07/27/2014 10:46 AM Phone: [phone] Fax: [phone]   [facility] 07/27/2014, 10:46 AM  [facility] [address] [place], [name], [address] Phone: [phone]   Fax:  [phone]

## 2014-07-27 NOTE — Progress Notes (Signed)

## 2014-07-28 ENCOUNTER — Ambulatory Visit
Admission: RE | Admit: 2014-07-28 | Discharge: 2014-07-28 | Disposition: A | Discharge status: Home / Self Care | Payer: Medicare Other | Source: Ambulatory Visit | Attending: Neurology | Admitting: Neurology

## 2014-07-28 DIAGNOSIS — R202 Paresthesia of skin: Secondary | ICD-10-CM

## 2014-07-28 MED ORDER — GADOBENATE DIMEGLUMINE 529 MG/ML IV SOLN: 15.0000 mL | Freq: Once | INTRAVENOUS | Status: AC | PRN

## 2014-08-01 NOTE — Progress Notes (Signed)
Quick Note:  Please call patient regarding her brain MRI from last week. No acute findings. No significant change from a head CT from December 2015 which is reassuring. Star Age, MD, PhD [facility] ([facility]  ______

## 2014-08-04 DIAGNOSIS — H25011 Cortical age-related cataract, right eye: Secondary | ICD-10-CM | POA: Diagnosis not present

## 2014-08-04 DIAGNOSIS — H40023 Open angle with borderline findings, high risk, bilateral: Secondary | ICD-10-CM | POA: Diagnosis not present

## 2014-08-04 DIAGNOSIS — H35033 Hypertensive retinopathy, bilateral: Secondary | ICD-10-CM | POA: Diagnosis not present

## 2014-08-04 DIAGNOSIS — H2511 Age-related nuclear cataract, right eye: Secondary | ICD-10-CM | POA: Diagnosis not present

## 2014-08-23 DIAGNOSIS — I10 Essential (primary) hypertension: Secondary | ICD-10-CM | POA: Diagnosis not present

## 2014-08-23 DIAGNOSIS — R209 Unspecified disturbances of skin sensation: Secondary | ICD-10-CM | POA: Diagnosis not present

## 2014-08-23 DIAGNOSIS — Z6825 Body mass index (BMI) 25.0-25.9, adult: Secondary | ICD-10-CM | POA: Diagnosis not present

## 2014-08-23 DIAGNOSIS — M810 Age-related osteoporosis without current pathological fracture: Secondary | ICD-10-CM | POA: Diagnosis not present

## 2014-08-25 ENCOUNTER — Telehealth: Payer: Self-pay | Admitting: Neurology

## 2014-08-25 NOTE — Telephone Encounter (Signed)
Patient stated she's experiencing Paresthesias on Left side of face, was instructed by NP at Dr. Keane Police office to inform Dr. Rexene Alberts.  Questioning if she needs to come in for appt.  Please call and advise.

## 2014-08-25 NOTE — Telephone Encounter (Signed)
Sara Brown states that she went to PCP yesterday and saw a NP. States that she felt like the L side of her mouth was drooping but the NP said that she did not see it. This is not a new feeling for her on the L side, but states it is happening more frequently. She also feels a "drawing sensation" in the L side of face. Denies headache or vision change. PCP reports that her cholesterol is "dangerously high". She has an appt with you 5/6. Any suggestions?

## 2014-08-25 NOTE — Telephone Encounter (Signed)
I really don't have any new suggestions. Reassuringly, she had a normal EEG recently and we also recently did a brain scan.

## 2014-08-30 ENCOUNTER — Encounter: Payer: Self-pay | Admitting: Adult Health

## 2014-08-30 ENCOUNTER — Ambulatory Visit (INDEPENDENT_AMBULATORY_CARE_PROVIDER_SITE_OTHER): Payer: Medicare Other | Admitting: Adult Health

## 2014-08-30 VITALS — BP 132/77 | HR 76 | Ht 67.0 in | Wt 158.0 lb

## 2014-08-30 DIAGNOSIS — R202 Paresthesia of skin: Secondary | ICD-10-CM | POA: Diagnosis not present

## 2014-08-30 DIAGNOSIS — E559 Vitamin D deficiency, unspecified: Secondary | ICD-10-CM | POA: Diagnosis not present

## 2014-08-30 NOTE — Patient Instructions (Signed)
Recheck Vitamin D in 3-4 months with PCP.  If you develop stroke like symptoms please go to the ED immediately.

## 2014-08-30 NOTE — Telephone Encounter (Signed)
Left VM with below information. Left call back number for further questions. Also stated she is welcome to make an appt with [place] or one of our NPs.

## 2014-08-30 NOTE — Progress Notes (Addendum)
PATIENT: Sara Brown DOB: Nov 26, 1940  REASON FOR VISIT: follow up- right-sided paresthesia, facial droop HISTORY FROM: patient  HISTORY OF PRESENT ILLNESS: Sara Brown is a 74 year old female with a history of primarily right-sided paresthesias. She returns today complaining of a right facial droop that the dentist noticed. She states that she was in for regular cleaning this morning and the dentist noticed that her right upper lip headache droop. However she states that she feels that the left side has a droop and she has a weird sensation around the lips. She also states that the left side of her tongue has a "weird sensation". She denies any numbness or weakness in the extremities. Denies any changes with her speech. No changes with the bowels or bladder. No changes with her gait. No changes with her vision. She does state that for the last 2 reviewed a she's felt very "sluggish" and fatigued. She does states that she was receiving diagnosed with vitamin D deficiency and is on supplementation. She also states that recent lead work showed that her cholesterol was elevated. The patient did have an MRI of the brain that showed chronic microvascular ischemia and generalized cerebral atrophy with no significant change from a CT of the head in 2015. The patient also had an EEG that was unremarkable. He returns today for follow-up.  HISTORY 07/15/14 [facility]):  Dear Dr. Virgina Jock, I saw your patient, Sara Brown, upon your kind request in my neurologic clinic today for initial consultation of her paresthesias. The patient is unaccompanied today. 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 She was told she may have a right facial droop when she saw physical therapy.  She is currently in outpatient physical therapy. She has had intermittent problems with abnormal sensation in her right face and arm for years. She may go days without symptoms. She wonders if this is a stress-related reaction. She was rushing to get to this appointment and while driving she had a sensation of tingling that started in her right mid face and went to her right shoulder. She continues to have very mild symptoms currently. Very occasionally will she have symptoms on the left side. She has no left or right lower body symptoms. Strength-wise sometimes she feels a little weak during the episode which can last minutes to hours. It is usually not just seconds. She does not lose consciousness or awareness. She has never noted any abnormal involuntary twitching. She has not noted any muscle wasting. Symptoms started years ago. She had a recent head CT and remote MRI brain. She never had an EEG. There is no overt triggers or alleviating factors. She does not have any associated headaches with this but certainly does report occasional left parietal headaches which are not debilitating.  She had a CT head without contrast on 05/27/2014: 1. No acute intracranial findings. 2. Mild microvascular disease. In addition, personally reviewed the images through the PACS system.  She had 2D echo on 10/18/13: this was unremarkable with EF 60-65%.   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 No other significant ischemic changes are noted. The enhancement pattern is normal and there is no mass lesion. No intracranial hemorrhage is identified.  In addition, personally reviewed the images through the PACS system. She had a MRA  head without contrast and MRA neck with contrast on 03/09/2004 because of dizziness reported: MR angiography of the intracranial circulation shows no significant proximal stenosis or intracranial atherosclerotic change.  Advanced dolichoectasia of the craniocervical vessels without significant proximal stenosis. She had a BMP on 06/15/2014: This was unremarkable.  REVIEW OF SYSTEMS: Out of a complete 14 system review of symptoms, the patient complains only of the following symptoms, and all other reviewed systems are negative.  ALLERGIES: Allergies  Allergen Reactions  . Naproxen     Patient preference   . Prednisone     Patient Preference     HOME MEDICATIONS: Outpatient Prescriptions Prior to Visit  Medication Sig Dispense Refill  . AMLODIPINE BESYLATE PO Take 10 mg by mouth daily.    Marland Kitchen aspirin 81 MG tablet Take 81 mg by mouth daily.    . budesonide-formoterol (SYMBICORT) 160-4.5 MCG/ACT inhaler Inhale 2 puffs into the lungs 2 (two) times daily. As needed    . carboxymethylcellulose (REFRESH PLUS) 0.5 % SOLN 1 drop daily. As directed    . Cholecalciferol (VITAMIN D) 400 UNITS capsule Take 400 Units by mouth daily.    . Colesevelam HCl (WELCHOL PO) Take 2 tablets by mouth 2 (two) times daily.     . cyclobenzaprine (FLEXERIL) 10 MG tablet Take 1 tablet (10 mg total) by mouth 2 (two) times daily as needed for muscle spasms. 20 tablet 0  . cycloSPORINE (RESTASIS) 0.05 % ophthalmic emulsion Place 1 drop into both eyes 2 (two) times daily. 1gtt OU bid    . fish oil-omega-3 fatty acids 1000 MG capsule Take 1 g by mouth daily.    . fluticasone (FLONASE) 50 MCG/ACT nasal spray Place 2 sprays into both nostrils daily.    . lansoprazole (PREVACID) 15 MG capsule Take 15 mg by mouth daily.    . Linaclotide (LINZESS) 290 MCG CAPS capsule Take 290 mcg by mouth daily. As needed    . losartan (COZAAR) 25 MG tablet Take 25 mg by mouth daily.    . Multiple Vitamins-Minerals (MULTIVITAMIN WITH  MINERALS) tablet Take 1 tablet by mouth daily.    . polyethylene glycol (MIRALAX / GLYCOLAX) packet Take 17 g by mouth daily as needed for mild constipation.    Marland Kitchen pyridOXINE (VITAMIN B-6) 100 MG tablet Take 100 mg by mouth daily.    Marland Kitchen spironolactone (ALDACTONE) 50 MG tablet Take 50 mg by mouth 2 (two) times daily.    . vitamin B-12 (CYANOCOBALAMIN) 1000 MCG tablet Take 1,000 mcg by mouth daily.    . vitamin E 100 UNIT capsule Take 100 Units by mouth daily.     No facility-administered medications prior to visit.    PAST MEDICAL HISTORY: Past Medical History  Diagnosis Date  . Hyperlipidemia   . Hypertension   . GERD (gastroesophageal reflux disease)   . Allergic rhinitis   . Osteoporosis   . OA (osteoarthritis)   . Asthma   . Vitamin D deficiency   . Carotid stenosis     ICA(L)  . Neuropathy     face (R)  . Constipation     PAST SURGICAL HISTORY: Past Surgical History  Procedure Laterality Date  . Cesarean section    . Cesarean section  FAMILY HISTORY: Family History  Problem Relation Age of Onset  . Diabetes Mellitus II Father   . Diabetes Mellitus II Mother   . Osteoporosis Mother   . Hypertension Mother   . CVA Sister     4  . CAD Sister     4  . Hypertension Sister     4  . Osteoarthritis Sister     4  . Diabetes Mellitus II Sister     4  . Dementia Sister     decreased  . Alcoholism Brother     SOCIAL HISTORY: History   Social History  . Marital Status: Married    Spouse Name: Bland Span  . Number of Children: 2  . Years of Education: 16   Occupational History  .      employed at home   Social History Main Topics  . Smoking status: Never Smoker   . Smokeless tobacco: Never Used  . Alcohol Use: No  . Drug Use: No  . Sexual Activity: Not on file   Other Topics Concern  . Not on file   Social History Narrative   Consumes no caffeine      PHYSICAL EXAM  Filed Vitals:   08/30/14 1405  BP: 132/77  Pulse: 76  Height: 5\' 7"   (1.702 m)  Weight: 158 lb (71.668 kg)   Body mass index is 24.74 kg/(m^2).  Generalized: Well developed, in no acute distress  Head: normocephalic and atraumatic. Oropharynx benign  Neck: Supple, no carotid bruits  Cardiac: Regular rate rhythm, no murmur  Musculoskeletal: No deformity   Neurological examination  Mentation: Alert oriented to time, place, history taking. Follows all commands speech and language fluent Cranial nerve II-XII: Pupils were equal round reactive to light extraocular movements were full, visual field were full on confrontational test. Facial sensation and strength were normal. Uvula tongue midline. Head turning and shoulder shrug  were normal and symmetric.Tongue protrusion into cheek strength was normal. Face is symmetrical. No droop noted. Motor: The motor testing reveals 5 over 5 strength of all 4 extremities. Good symmetric motor tone is noted throughout.  Sensory: Sensory testing is intact to pinprick, soft touch, vibration sensation, and position sense on all 4 extremities. No evidence of extinction is noted.  Coordination: Cerebellar testing reveals good finger-nose-finger and heel-to-shin bilaterally.  Gait and station: Gait is normal. Tandem gait is normal. Romberg is negative. No drift is seen.  Reflexes: Deep tendon reflexes are symmetric and normal bilaterally  DIAGNOSTIC DATA (LABS, IMAGING, TESTING) - I reviewed patient records, labs, notes, testing and imaging myself where available.     ASSESSMENT AND PLAN 74 y.o. year old female  has a past medical history of Hyperlipidemia; Hypertension; GERD (gastroesophageal reflux disease); Allergic rhinitis; Osteoporosis; OA (osteoarthritis); Asthma; Vitamin D deficiency; Carotid stenosis; Neuropathy; and Constipation. here with:  1. Right-sided paresthesia  2. Facial droop  Patient returns today complaining of a facial droop that was noticed by her dentist however her exam does not indicate a droop on  either the right or left side. Her neurological exam is normal. The patient has had a MRI of the brain that showed no acute changes as well as an EEG that was normal. I consulted with Dr. Rexene Alberts who came in and examined the patient as well. She agrees that the patient does not have signs of a facial droop. The patient was recently diagnosed with vitamin D deficiency which could explain her fatigue. She was advised to follow-up with her  primary care provider.If she develops strokelike symptoms she should go to the emergency room immediately. She will follow-up in 6 months or sooner if needed.  Sara Givens, MSN, NP-C 08/30/2014, 2:04 PM [facility] [address] [place], [place] [address] [phone]  Note: This document was prepared with digital dictation and possible smart phrase technology. Any transcriptional errors that result from this process are unintentional.  I reviewed the above note and documentation by the Nurse Practitioner and agree with the history, physical exam, assessment and plan as outlined above. I was immediately available for face-to-face consultation and in fact also spoke with the patient and examined her briefly. She does not have any obvious facial droop or facial decrease in sensation. She has no facial pain. She was reassured. We look for structural changes on her brain MRI or evidence of stroke in the past that could explain some of her symptoms. She was advised about the brain MRI results and EEG results, and her EEG was reported as normal. She has been diagnosed with vitamin D deficiency and it was explained to her that it takes some time to replenish vitamin D. She is encouraged to follow-up with her primary care physician and we can schedule a routine follow-up in neurology as well.  Star Age, MD, PhD [facility])

## 2014-09-01 DIAGNOSIS — Z6825 Body mass index (BMI) 25.0-25.9, adult: Secondary | ICD-10-CM | POA: Diagnosis not present

## 2014-09-01 DIAGNOSIS — K644 Residual hemorrhoidal skin tags: Secondary | ICD-10-CM | POA: Diagnosis not present

## 2014-09-01 DIAGNOSIS — K5900 Constipation, unspecified: Secondary | ICD-10-CM | POA: Diagnosis not present

## 2014-09-19 DIAGNOSIS — M1990 Unspecified osteoarthritis, unspecified site: Secondary | ICD-10-CM | POA: Diagnosis not present

## 2014-09-19 DIAGNOSIS — I6529 Occlusion and stenosis of unspecified carotid artery: Secondary | ICD-10-CM | POA: Diagnosis not present

## 2014-09-19 DIAGNOSIS — Z1389 Encounter for screening for other disorder: Secondary | ICD-10-CM | POA: Diagnosis not present

## 2014-09-19 DIAGNOSIS — Z6824 Body mass index (BMI) 24.0-24.9, adult: Secondary | ICD-10-CM | POA: Diagnosis not present

## 2014-09-19 DIAGNOSIS — M810 Age-related osteoporosis without current pathological fracture: Secondary | ICD-10-CM | POA: Diagnosis not present

## 2014-09-19 DIAGNOSIS — E785 Hyperlipidemia, unspecified: Secondary | ICD-10-CM | POA: Diagnosis not present

## 2014-09-19 DIAGNOSIS — R209 Unspecified disturbances of skin sensation: Secondary | ICD-10-CM | POA: Diagnosis not present

## 2014-09-19 DIAGNOSIS — I10 Essential (primary) hypertension: Secondary | ICD-10-CM | POA: Diagnosis not present

## 2014-09-19 DIAGNOSIS — I272 Other secondary pulmonary hypertension: Secondary | ICD-10-CM | POA: Diagnosis not present

## 2014-10-11 DIAGNOSIS — L562 Photocontact dermatitis [berloque dermatitis]: Secondary | ICD-10-CM | POA: Diagnosis not present

## 2014-10-14 ENCOUNTER — Ambulatory Visit: Payer: Medicare Other | Admitting: Neurology

## 2014-10-17 ENCOUNTER — Ambulatory Visit (INDEPENDENT_AMBULATORY_CARE_PROVIDER_SITE_OTHER): Payer: Medicare Other | Admitting: Neurology

## 2014-10-17 ENCOUNTER — Encounter: Payer: Self-pay | Admitting: Neurology

## 2014-10-17 VITALS — BP 112/68 | HR 80 | Resp 16 | Ht 67.0 in | Wt 152.0 lb

## 2014-10-17 DIAGNOSIS — E559 Vitamin D deficiency, unspecified: Secondary | ICD-10-CM | POA: Diagnosis not present

## 2014-10-17 DIAGNOSIS — M25561 Pain in right knee: Secondary | ICD-10-CM

## 2014-10-17 DIAGNOSIS — R202 Paresthesia of skin: Secondary | ICD-10-CM | POA: Diagnosis not present

## 2014-10-17 DIAGNOSIS — M25562 Pain in left knee: Secondary | ICD-10-CM

## 2014-10-17 NOTE — Progress Notes (Addendum)
Subjective:    Patient ID: Sara Brown is a 74 y.o. female.  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 The patient is unaccompanied today. I first met her on 07/15/2014 at the request of her primary care physician, at which time she reported a several year history of intermittent abnormal sensations in her right face, also affecting her right arm with pulling sensation in the right arm and shoulders as well as tingling noted. I suggested further evaluation with an EEG and MRI brain. We called her with all her test results and also she was seen in the interim by her nurse practitioner, Ms. Clabe Seal on 08/30/2014 at which time we discussed all her test results as well and I also saw her at the time. She felt she may have a facial droop on the left. On examination, she did not have any obvious facial droop on either side. Her exam was nonfocal.   She had an EEG on 07/26/2014 which was reported as normal in the awake state. 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 No enhancing lesions are noted. No significant change compared with CT head dated 05/27/2014. In addition, I personally reviewed the images through the PACS system and agree with the findings.  Today, 10/17/2014: She reports no new symptoms. She feels that sometimes she has a drawing sensation at the left side of her mouth. She has no one-sided numbness or tingling or weakness. She has improved symptoms on the right  side. She had recent blood work with her primary care physician last month. We will request test results from his PCP.   Previously:  Has has a several year history of intermittent abnormal sensations in her right face and R arm, with pulling sensation in the right arm and shoulder with tingling noted. She was told she may have a right facial droop when she saw physical therapy.  She is currently in outpatient physical therapy. She has had intermittent problems with abnormal sensation in her right face and arm for years. She may go days without symptoms. She wonders if this is a stress-related reaction. She was rushing to get to this appointment and while driving she had a sensation of tingling that started in her right mid face and went to her right shoulder. She continues to have very mild symptoms currently. Very occasionally will she have symptoms on the left side. She has no left or right lower body symptoms. Strength-wise sometimes she feels a little weak during the episode which can last minutes to hours. It is usually not just seconds. She does not lose consciousness or awareness. She has never noted any abnormal involuntary twitching. She has not noted any muscle wasting. Symptoms started years ago. She had a recent head CT and remote MRI brain. She never had an EEG. There is no overt triggers or alleviating factors. She does not have any associated headaches with this but certainly does report occasional left parietal headaches which are not debilitating.  She had a CT head without contrast on 05/27/2014: 1. No acute intracranial findings. 2. Mild  microvascular disease. In addition, personally reviewed the images through the PACS system.  She had 2D echo on 10/18/13: this was unremarkable with EF 60-65%.   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 No other significant  ischemic changes are noted. The enhancement pattern is normal and there is no mass lesion. No intracranial hemorrhage is identified.   In addition, personally reviewed the images through the PACS system. She had a MRA head without contrast and MRA neck with contrast on 03/09/2004 because of dizziness reported: MR angiography of the intracranial circulation shows no significant proximal stenosis or intracranial atherosclerotic change.   Advanced dolichoectasia of the craniocervical vessels without significant proximal stenosis. She had a BMP on 06/15/2014: This was unremarkable.  Her Past Medical History Is Significant For: Past Medical History  Diagnosis Date  . Hyperlipidemia   . Hypertension   . GERD (gastroesophageal reflux disease)   . Allergic rhinitis   . Osteoporosis   . OA (osteoarthritis)   . Asthma   . Vitamin D deficiency   . Carotid stenosis     ICA(L)  . Neuropathy     face (R)  . Constipation     Her Past Surgical History Is Significant For: Past Surgical History  Procedure Laterality Date  . Cesarean section    . Cesarean section      Her Family History Is Significant For: Family History  Problem Relation Age of Onset  . Diabetes Mellitus II Father   . Diabetes Mellitus II Mother   . Osteoporosis Mother   . Hypertension Mother   . CVA Sister     4  . CAD Sister     4  . Hypertension Sister     4  . Osteoarthritis Sister     4  . Diabetes Mellitus II Sister     4  . Dementia Sister     decreased  . Alcoholism Brother     Her Social History Is Significant For: History   Social History  . Marital Status: Married    Spouse Name: Bland Span  . Number of Children: 2  . Years of Education: 16   Occupational History  .      employed at home   Social History Main Topics  . Smoking status: Never Smoker   . Smokeless tobacco: Never Used  . Alcohol Use: No  . Drug Use: No  . Sexual Activity: Not on file   Other Topics Concern  . None   Social  History Narrative   Consumes no caffeine    Her Allergies Are:  Allergies  Allergen Reactions  . Naproxen     Patient preference   . Prednisone     Patient Preference   :   Her Current Medications Are:  Outpatient Encounter Prescriptions as of 10/17/2014  Medication Sig  . AMLODIPINE BESYLATE PO Take 10 mg by mouth daily.  Marland Kitchen aspirin 81 MG tablet Take 81 mg by mouth daily.  . budesonide-formoterol (SYMBICORT) 160-4.5 MCG/ACT inhaler Inhale 2 puffs into the lungs 2 (two) times daily. As needed  . carboxymethylcellulose (REFRESH PLUS) 0.5 % SOLN 1 drop daily. As directed  . Colesevelam HCl (WELCHOL PO) Take 2 tablets by mouth 2 (two) times daily.   . cycloSPORINE (RESTASIS) 0.05 % ophthalmic emulsion Place 1 drop into both eyes 2 (two) times daily. 1gtt OU bid  .  fish oil-omega-3 fatty acids 1000 MG capsule Take 1 g by mouth daily.  . fluticasone (FLONASE) 50 MCG/ACT nasal spray Place 2 sprays into both nostrils daily.  . lansoprazole (PREVACID) 15 MG capsule Take 15 mg by mouth daily.  Marland Kitchen losartan (COZAAR) 25 MG tablet Take 25 mg by mouth daily.  . Multiple Vitamins-Minerals (MULTIVITAMIN WITH MINERALS) tablet Take 1 tablet by mouth daily.  . polyethylene glycol (MIRALAX / GLYCOLAX) packet Take 17 g by mouth daily as needed for mild constipation.  Marland Kitchen pyridOXINE (VITAMIN B-6) 100 MG tablet Take 100 mg by mouth daily.  Marland Kitchen spironolactone (ALDACTONE) 50 MG tablet Take 50 mg by mouth 2 (two) times daily.  . vitamin B-12 (CYANOCOBALAMIN) 1000 MCG tablet Take 1,000 mcg by mouth daily.  . vitamin E 100 UNIT capsule Take 100 Units by mouth daily.  . [DISCONTINUED] Cholecalciferol (VITAMIN D) 400 UNITS capsule Take 400 Units by mouth daily.  . [DISCONTINUED] cyclobenzaprine (FLEXERIL) 10 MG tablet Take 1 tablet (10 mg total) by mouth 2 (two) times daily as needed for muscle spasms.  . [DISCONTINUED] Linaclotide (LINZESS) 290 MCG CAPS capsule Take 290 mcg by mouth daily. As needed   No  facility-administered encounter medications on file as of 10/17/2014.  :  Review of Systems:  Out of a complete 14 point review of systems, all are reviewed and negative with the exception of these symptoms as listed below:   Review of Systems  Neurological:       Patient feels like L side of mouth tries to draw up     Objective:  Neurologic Exam  Physical Exam Physical Examination:   Filed Vitals:   10/17/14 1136  BP: 112/68  Pulse: 80  Resp: 16    General Examination: The patient is a very pleasant 74 y.o. female in no acute distress. She appears well-developed and well-nourished and very well groomed.   HEENT: Normocephalic, atraumatic, pupils are equal, round and reactive to light and accommodation. Funduscopic exam is normal with sharp disc margins noted. Extraocular tracking is good without limitation to gaze excursion or nystagmus noted. Normal smooth pursuit is noted. Hearing is grossly intact. Face is symmetric with normal facial animation and normal facial sensation. Speech is clear with no dysarthria noted. There is no hypophonia. There is no lip, neck/head, jaw or voice tremor. Neck is supple with full range of passive and active motion. There are no carotid bruits on auscultation. Oropharynx exam reveals: mild mouth dryness, adequate dental hygiene and mild airway crowding. Mallampati is class II. Tongue protrudes centrally and palate elevates symmetrically.   Chest: Clear to auscultation without wheezing, rhonchi or crackles noted.  Heart: S1+S2+0, regular and normal without murmurs, rubs or gallops noted.   Abdomen: Soft, non-tender and non-distended with normal bowel sounds appreciated on auscultation.  Extremities: There is no pitting edema in the distal lower extremities bilaterally. Pedal pulses are intact.  Skin: Warm and dry without trophic changes noted. There are no varicose veins.  Musculoskeletal: exam reveals no obvious joint deformities, tenderness or  joint swelling or erythema, with the exception of b/l knee pain.   Neurologically:  Mental status: The patient is awake, alert and oriented in all 4 spheres. Her immediate and remote memory, attention, language skills and fund of knowledge are appropriate. There is no evidence of aphasia, agnosia, apraxia or anomia. Speech is clear with normal prosody and enunciation. Thought process is linear. Mood is normal and affect is normal.  Cranial nerves II - XII are  as described above under HEENT exam. In addition: shoulder shrug is normal with equal shoulder height noted. Motor exam: Normal bulk, strength and tone is noted. There is no drift, tremor or rebound. Romberg is negative. Reflexes are 2+ throughout. Babinski: Toes are flexor bilaterally. Fine motor skills and coordination: intact with normal finger taps, normal hand movements, normal rapid alternating patting, normal foot taps and normal foot agility.  Cerebellar testing: No dysmetria or intention tremor on finger to nose testing. Heel to shin is unremarkable bilaterally. There is no truncal or gait ataxia.  Sensory exam: intact to light touch.  Gait, station and balance: She stands easily. No veering to one side is noted. No leaning to one side is noted. Posture is age-appropriate and stance is narrow based. Gait shows normal stride length and normal pace. Tandem walk is difficult for her. She reports bilateral knee pain. No problems turning are noted. She turns en bloc.   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  She then reported L facial drawing sensation. Her exam continues to be non-focal and she is reassured. We talked about her recent test results, including MRI brain and EEG. We will request her recent blood work from her primary care physician's office. At this juncture, she is advised to follow-up as needed.  I suggested that she continue staying active and drink plenty of fluid. I did not suggest any new medications. I answered all her questions today and she was in agreement. I spent 15 minutes in total face-to-face time with the patient, more than 50% of which was spent in counseling and coordination of care, reviewing test results, reviewing medication and discussing abnormal facial movements.   10/17/2014: I received blood test results from 09/19/2014 through her primary care physician's office: CMP was unremarkable, total cholesterol 180, LDL 1:15, triglycerides 112.

## 2014-10-17 NOTE — Patient Instructions (Signed)
Your exam looks good today.  Stay active mentally and physically.  Drink plenty of fluid.  I will you back as needed.

## 2014-11-03 DIAGNOSIS — H40023 Open angle with borderline findings, high risk, bilateral: Secondary | ICD-10-CM | POA: Diagnosis not present

## 2014-11-23 DIAGNOSIS — R634 Abnormal weight loss: Secondary | ICD-10-CM | POA: Diagnosis not present

## 2014-11-23 DIAGNOSIS — R1030 Lower abdominal pain, unspecified: Secondary | ICD-10-CM | POA: Diagnosis not present

## 2014-11-23 DIAGNOSIS — R194 Change in bowel habit: Secondary | ICD-10-CM | POA: Diagnosis not present

## 2014-11-27 ENCOUNTER — Encounter (HOSPITAL_COMMUNITY): Payer: Self-pay | Admitting: *Deleted

## 2014-11-27 ENCOUNTER — Emergency Department (HOSPITAL_COMMUNITY)
Admission: EM | Admit: 2014-11-27 | Discharge: 2014-11-27 | Disposition: A | Discharge status: Home / Self Care | Payer: Medicare Other | Attending: Emergency Medicine | Admitting: Emergency Medicine

## 2014-11-27 ENCOUNTER — Emergency Department (HOSPITAL_COMMUNITY): Payer: Medicare Other

## 2014-11-27 DIAGNOSIS — R109 Unspecified abdominal pain: Secondary | ICD-10-CM

## 2014-11-27 DIAGNOSIS — Z79899 Other long term (current) drug therapy: Secondary | ICD-10-CM | POA: Insufficient documentation

## 2014-11-27 DIAGNOSIS — J45909 Unspecified asthma, uncomplicated: Secondary | ICD-10-CM | POA: Diagnosis not present

## 2014-11-27 DIAGNOSIS — E785 Hyperlipidemia, unspecified: Secondary | ICD-10-CM | POA: Insufficient documentation

## 2014-11-27 DIAGNOSIS — M1990 Unspecified osteoarthritis, unspecified site: Secondary | ICD-10-CM | POA: Diagnosis not present

## 2014-11-27 DIAGNOSIS — I10 Essential (primary) hypertension: Secondary | ICD-10-CM | POA: Insufficient documentation

## 2014-11-27 DIAGNOSIS — R1032 Left lower quadrant pain: Secondary | ICD-10-CM | POA: Insufficient documentation

## 2014-11-27 DIAGNOSIS — Z792 Long term (current) use of antibiotics: Secondary | ICD-10-CM | POA: Insufficient documentation

## 2014-11-27 DIAGNOSIS — G629 Polyneuropathy, unspecified: Secondary | ICD-10-CM | POA: Diagnosis not present

## 2014-11-27 DIAGNOSIS — R1031 Right lower quadrant pain: Secondary | ICD-10-CM | POA: Diagnosis not present

## 2014-11-27 DIAGNOSIS — K429 Umbilical hernia without obstruction or gangrene: Secondary | ICD-10-CM | POA: Diagnosis not present

## 2014-11-27 DIAGNOSIS — R1030 Lower abdominal pain, unspecified: Secondary | ICD-10-CM | POA: Insufficient documentation

## 2014-11-27 DIAGNOSIS — K219 Gastro-esophageal reflux disease without esophagitis: Secondary | ICD-10-CM | POA: Insufficient documentation

## 2014-11-27 DIAGNOSIS — Z7982 Long term (current) use of aspirin: Secondary | ICD-10-CM | POA: Insufficient documentation

## 2014-11-27 DIAGNOSIS — K760 Fatty (change of) liver, not elsewhere classified: Secondary | ICD-10-CM | POA: Diagnosis not present

## 2014-11-27 DIAGNOSIS — R102 Pelvic and perineal pain: Secondary | ICD-10-CM | POA: Diagnosis present

## 2014-11-27 LAB — COMPREHENSIVE METABOLIC PANEL
ALBUMIN: 4.4 g/dL (ref 3.5–5.0)
ALT: 23 U/L (ref 14–54)
ANION GAP: 7 (ref 5–15)
AST: 25 U/L (ref 15–41)
Alkaline Phosphatase: 60 U/L (ref 38–126)
BILIRUBIN TOTAL: 1 mg/dL (ref 0.3–1.2)
BUN: 15 mg/dL (ref 6–20)
CO2: 28 mmol/L (ref 22–32)
Calcium: 9.7 mg/dL (ref 8.9–10.3)
Chloride: 101 mmol/L (ref 101–111)
Creatinine, Ser: 0.87 mg/dL (ref 0.44–1.00)
GFR calc Af Amer: >60 mL/min (ref >60)
GFR calc non Af Amer: >60 mL/min (ref >60)
GLUCOSE: 125 mg/dL — AB (ref 65–99)
Potassium: 3.8 mmol/L (ref 3.5–5.1)
SODIUM: 136 mmol/L (ref 135–145)
Total Protein: 7.6 g/dL (ref 6.5–8.1)

## 2014-11-27 LAB — CBC WITH DIFFERENTIAL/PLATELET
BASOS ABS: 0 10*3/uL (ref 0.0–0.1)
BASOS PCT: 0 % (ref 0–1)
Eosinophils Absolute: 0.1 10*3/uL (ref 0.0–0.7)
Eosinophils Relative: 2 % (ref 0–5)
HEMATOCRIT: 39.5 % (ref 36.0–46.0)
HEMOGLOBIN: 12.8 g/dL (ref 12.0–15.0)
Lymphocytes Relative: 34 % (ref 12–46)
Lymphs Abs: 1.8 10*3/uL (ref 0.7–4.0)
MCH: 28.6 pg (ref 26.0–34.0)
MCHC: 32.4 g/dL (ref 30.0–36.0)
MCV: 88.4 fL (ref 78.0–100.0)
MONOS PCT: 11 % (ref 3–12)
Monocytes Absolute: 0.6 10*3/uL (ref 0.1–1.0)
Neutro Abs: 2.9 10*3/uL (ref 1.7–7.7)
Neutrophils Relative %: 53 % (ref 43–77)
Platelets: 277 10*3/uL (ref 150–400)
RBC: 4.47 MIL/uL (ref 3.87–5.11)
RDW: 13.6 % (ref 11.5–15.5)
WBC: 5.4 10*3/uL (ref 4.0–10.5)

## 2014-11-27 LAB — URINALYSIS, ROUTINE W REFLEX MICROSCOPIC
BILIRUBIN URINE: NEGATIVE
Glucose, UA: NEGATIVE mg/dL
HGB URINE DIPSTICK: NEGATIVE
Ketones, ur: NEGATIVE mg/dL
Leukocytes, UA: NEGATIVE
Nitrite: NEGATIVE
PH: 6.5 (ref 5.0–8.0)
PROTEIN: NEGATIVE mg/dL
Specific Gravity, Urine: 1.004 — ABNORMAL LOW (ref 1.005–1.030)
UROBILINOGEN UA: 0.2 mg/dL (ref 0.0–1.0)

## 2014-11-27 MED ORDER — IOHEXOL 300 MG/ML  SOLN
100.0000 mL | Freq: Once | INTRAMUSCULAR | Status: AC | PRN
Start: 2014-11-27 — End: 2014-11-27
  Administered 2014-11-27: 100 mL via INTRAVENOUS

## 2014-11-27 MED ORDER — LORAZEPAM 2 MG/ML IJ SOLN
1.0000 mg | Freq: Once | INTRAMUSCULAR | Status: AC
  Administered 2014-11-27: 1 mg via INTRAVENOUS
  Filled 2014-11-27: qty 1

## 2014-11-27 MED ORDER — IOHEXOL 300 MG/ML  SOLN
50.0000 mL | Freq: Once | INTRAMUSCULAR | Status: AC | PRN
  Administered 2014-11-27: 50 mL via ORAL

## 2014-11-27 NOTE — ED Notes (Signed)
Pt informed of need for urine specimen however she cannot provide one at this time.

## 2014-11-27 NOTE — ED Provider Notes (Signed)
CSN: [id number]     Arrival date & time 11/27/14  0046 History   First MD Initiated Contact with Patient 11/27/14 0149     Chief Complaint  Patient presents with  . Pelvic Pain     (Consider location/radiation/quality/duration/timing/severity/associated sxs/prior Treatment) Patient is a 73 y.o. female presenting with pelvic pain.  Pelvic Pain This is a new problem. Episode onset: 1 week ago. The problem occurs hourly. The problem has not changed since onset.Associated symptoms include abdominal pain. Pertinent negatives include no chest pain, no headaches and no shortness of breath. Nothing aggravates the symptoms. Nothing relieves the symptoms. She has tried nothing for the symptoms.    Past Medical History  Diagnosis Date  . Hyperlipidemia   . Hypertension   . GERD (gastroesophageal reflux disease)   . Allergic rhinitis   . Osteoporosis   . OA (osteoarthritis)   . Asthma   . Vitamin D deficiency   . Carotid stenosis     ICA(L)  . Neuropathy     face (R)  . Constipation    Past Surgical History  Procedure Laterality Date  . Cesarean section    . Cesarean section     Family History  Problem Relation Age of Onset  . Diabetes Mellitus II Father   . Diabetes Mellitus II Mother   . Osteoporosis Mother   . Hypertension Mother   . CVA Sister     4  . CAD Sister     4  . Hypertension Sister     4  . Osteoarthritis Sister     4  . Diabetes Mellitus II Sister     4  . Dementia Sister     decreased  . Alcoholism Brother    History  Substance Use Topics  . Smoking status: Never Smoker   . Smokeless tobacco: Never Used  . Alcohol Use: No   OB History    No data available     Review of Systems  Respiratory: Negative for shortness of breath.   Cardiovascular: Negative for chest pain.  Gastrointestinal: Positive for abdominal pain.  Genitourinary: Positive for pelvic pain.  Neurological: Negative for headaches.  All other systems reviewed and are  negative.     Allergies  Naproxen and Prednisone  Home Medications   Prior to Admission medications   Medication Sig Start Date End Date Taking? Authorizing Provider  acetaminophen (TYLENOL) 500 MG tablet Take 1,000 mg by mouth every 6 (six) hours as needed for mild pain.   Yes Historical Provider, MD  AMLODIPINE BESYLATE PO Take 10 mg by mouth daily.   Yes Historical Provider, MD  aspirin 81 MG tablet Take 81 mg by mouth daily.   Yes Historical Provider, MD  carboxymethylcellulose (REFRESH PLUS) 0.5 % SOLN 1 drop daily. As directed   Yes Historical Provider, MD  cycloSPORINE (RESTASIS) 0.05 % ophthalmic emulsion Place 1 drop into both eyes 2 (two) times daily. 1gtt OU bid   Yes Historical Provider, MD  fish oil-omega-3 fatty acids 1000 MG capsule Take 1 g by mouth daily.   Yes Historical Provider, MD  fluticasone (FLONASE) 50 MCG/ACT nasal spray Place 2 sprays into both nostrils daily as needed for allergies.    Yes Historical Provider, MD  lansoprazole (PREVACID) 15 MG capsule Take 15 mg by mouth daily as needed (acid reflux).    Yes Historical Provider, MD  losartan (COZAAR) 25 MG tablet Take 25 mg by mouth daily.   Yes Historical Provider, MD  magnesium  30 MG tablet Take 30 mg by mouth daily.   Yes Historical Provider, MD  Multiple Vitamins-Minerals (MULTIVITAMIN WITH MINERALS) tablet Take 1 tablet by mouth daily.   Yes Historical Provider, MD  Pitavastatin Calcium (LIVALO PO) Take 1 tablet by mouth daily.   Yes Historical Provider, MD  polyethylene glycol (MIRALAX / GLYCOLAX) packet Take 17 g by mouth daily as needed for mild constipation.   Yes Historical Provider, MD  pyridOXINE (VITAMIN B-6) 100 MG tablet Take 100 mg by mouth daily.   Yes Historical Provider, MD  spironolactone (ALDACTONE) 50 MG tablet Take 50 mg by mouth 2 (two) times daily.   Yes Historical Provider, MD  triamcinolone cream (KENALOG) 0.1 % Apply 1 application topically See admin instructions. To affected area as  needed to rash 11/01/14  Yes Historical Provider, MD  vitamin B-12 (CYANOCOBALAMIN) 1000 MCG tablet Take 1,000 mcg by mouth daily.   Yes Historical Provider, MD  vitamin E 100 UNIT capsule Take 100 Units by mouth daily.   Yes Historical Provider, MD  Colesevelam HCl (WELCHOL PO) Take 2 tablets by mouth 2 (two) times daily.     Historical Provider, MD   BP 123/73 mmHg  Pulse 61  Temp(Src) 97.5 F (36.4 C) (Oral)  Resp 14  SpO2 100% Physical Exam  Constitutional: She is oriented to person, place, and time. She appears well-developed and well-nourished.  HENT:  Head: Normocephalic and atraumatic.  Right Ear: External ear normal.  Left Ear: External ear normal.  Eyes: Conjunctivae and EOM are normal. Pupils are equal, round, and reactive to light.  Neck: Normal range of motion. Neck supple.  Cardiovascular: Normal rate, regular rhythm, normal heart sounds and intact distal pulses.   Pulmonary/Chest: Effort normal and breath sounds normal.  Abdominal: Soft. Bowel sounds are normal. There is tenderness in the right lower quadrant, suprapubic area and left lower quadrant.  Musculoskeletal: Normal range of motion.  Neurological: She is alert and oriented to person, place, and time.  Skin: Skin is warm and dry.  Vitals reviewed.   ED Course  Procedures (including critical care time) Labs Review Labs Reviewed  COMPREHENSIVE METABOLIC PANEL - Abnormal; Notable for the following:    Glucose, Bld 125 (*)    All other components within normal limits  URINALYSIS, ROUTINE W REFLEX MICROSCOPIC (NOT AT [facility]) - Abnormal; Notable for the following:    Specific Gravity, Urine 1.004 (*)    All other components within normal limits  CBC WITH DIFFERENTIAL/PLATELET    Imaging Review Ct Abdomen Pelvis W Contrast  11/27/2014   CLINICAL DATA:  Suprapubic pain for 1 week. Sought GI doctor on Wednesday and had blood work done which was normal. Now patient started to have jerking movements in the legs.  White cell count 5.4.  EXAM: CT ABDOMEN AND PELVIS WITH CONTRAST  TECHNIQUE: Multidetector CT imaging of the abdomen and pelvis was performed using the standard protocol following bolus administration of intravenous contrast.  CONTRAST:  81mL OMNIPAQUE IOHEXOL 300 MG/ML SOLN, 11mL OMNIPAQUE IOHEXOL 300 MG/ML SOLN  COMPARISON:  None.  FINDINGS: Atelectasis in the lung bases.  Mild diffuse fatty infiltration of the liver. No focal liver lesions. The gallbladder, spleen, pancreas, adrenal glands, kidneys, abdominal aorta, inferior vena cava, and retroperitoneal lymph nodes are unremarkable. Stomach, small bowel, and colon are not abnormally distended. No free air or free fluid in the abdomen. Small umbilical hernia containing fat.  Pelvis: Appendix is normal. Bladder wall is not thickened. Uterus and ovaries are  not enlarged. Calcifications in the uterus may represent small fibroids. No pelvic mass or lymphadenopathy. No free or loculated pelvic fluid collections. Mild degenerative changes in the spine. No destructive bone lesions.  IMPRESSION: Mild diffuse fatty infiltration of the liver. No acute abnormality demonstrated in the abdomen or pelvis. Small umbilical hernia containing fat.   Electronically Signed   By: Lucienne Capers M.D.   On: 11/27/2014 04:28     EKG Interpretation None      MDM   Final diagnoses:  Abdominal pain    74 y.o. female with pertinent PMH of IBS, GERD, chronic constipation presents with suprapubic pain as above for weeks.  No nausea, vomiting, or fevers.  She is well appearing on visit today and complains of only a very small amount of pain.  Exam as above with mild suprapubic tenderness.  UA unremarkable.  CT scan similarly without acute explanation for pain.  Pt sleeping comfortably on recheck.  DC home in stable condition.    I have reviewed all laboratory and imaging studies if ordered as above  1. Abdominal pain        Debby Freiberg, MD 11/27/14 [phone]

## 2014-11-27 NOTE — Discharge Instructions (Signed)
Abdominal Pain, Women °Abdominal (stomach, pelvic, or belly) pain can be caused by many things. It is important to tell your doctor: °· The location of the pain. °· Does it come and go or is it present all the time? °· Are there things that start the pain (eating certain foods, exercise)? °· Are there other symptoms associated with the pain (fever, nausea, vomiting, diarrhea)? °All of this is helpful to know when trying to find the cause of the pain. °CAUSES  °· Stomach: virus or bacteria infection, or ulcer. °· Intestine: appendicitis (inflamed appendix), regional ileitis (Crohn's disease), ulcerative colitis (inflamed colon), irritable bowel syndrome, diverticulitis (inflamed diverticulum of the colon), or cancer of the stomach or intestine. °· Gallbladder disease or stones in the gallbladder. °· Kidney disease, kidney stones, or infection. °· Pancreas infection or cancer. °· Fibromyalgia (pain disorder). °· Diseases of the female organs: °¨ Uterus: fibroid (non-cancerous) tumors or infection. °¨ Fallopian tubes: infection or tubal pregnancy. °¨ Ovary: cysts or tumors. °¨ Pelvic adhesions (scar tissue). °¨ Endometriosis (uterus lining tissue growing in the pelvis and on the pelvic organs). °¨ Pelvic congestion syndrome (female organs filling up with blood just before the menstrual period). °¨ Pain with the menstrual period. °¨ Pain with ovulation (producing an egg). °¨ Pain with an IUD (intrauterine device, birth control) in the uterus. °¨ Cancer of the female organs. °· Functional pain (pain not caused by a disease, may improve without treatment). °· Psychological pain. °· Depression. °DIAGNOSIS  °Your doctor will decide the seriousness of your pain by doing an examination. °· Blood tests. °· X-rays. °· Ultrasound. °· CT scan (computed tomography, special type of X-ray). °· MRI (magnetic resonance imaging). °· Cultures, for infection. °· Barium enema (dye inserted in the large intestine, to better view it with  X-rays). °· Colonoscopy (looking in intestine with a lighted tube). °· Laparoscopy (minor surgery, looking in abdomen with a lighted tube). °· Major abdominal exploratory surgery (looking in abdomen with a large incision). °TREATMENT  °The treatment will depend on the cause of the pain.  °· Many cases can be observed and treated at home. °· Over-the-counter medicines recommended by your caregiver. °· Prescription medicine. °· Antibiotics, for infection. °· Birth control pills, for painful periods or for ovulation pain. °· Hormone treatment, for endometriosis. °· Nerve blocking injections. °· Physical therapy. °· Antidepressants. °· Counseling with a psychologist or psychiatrist. °· Minor or major surgery. °HOME CARE INSTRUCTIONS  °· Do not take laxatives, unless directed by your caregiver. °· Take over-the-counter pain medicine only if ordered by your caregiver. Do not take aspirin because it can cause an upset stomach or bleeding. °· Try a clear liquid diet (broth or water) as ordered by your caregiver. Slowly move to a bland diet, as tolerated, if the pain is related to the stomach or intestine. °· Have a thermometer and take your temperature several times a day, and record it. °· Bed rest and sleep, if it helps the pain. °· Avoid sexual intercourse, if it causes pain. °· Avoid stressful situations. °· Keep your follow-up appointments and tests, as your caregiver orders. °· If the pain does not go away with medicine or surgery, you may try: °¨ Acupuncture. °¨ Relaxation exercises (yoga, meditation). °¨ Group therapy. °¨ Counseling. °SEEK MEDICAL CARE IF:  °· You notice certain foods cause stomach pain. °· Your home care treatment is not helping your pain. °· You need stronger pain medicine. °· You want your IUD removed. °· You feel faint or   lightheaded. °· You develop nausea and vomiting. °· You develop a rash. °· You are having side effects or an allergy to your medicine. °SEEK IMMEDIATE MEDICAL CARE IF:  °· Your  pain does not go away or gets worse. °· You have a fever. °· Your pain is felt only in portions of the abdomen. The right side could possibly be appendicitis. The left lower portion of the abdomen could be colitis or diverticulitis. °· You are passing blood in your stools (bright red or black tarry stools, with or without vomiting). °· You have blood in your urine. °· You develop chills, with or without a fever. °· You pass out. °MAKE SURE YOU:  °· Understand these instructions. °· Will watch your condition. °· Will get help right away if you are not doing well or get worse. °Document Released: 03/24/2007 Document Revised: 10/11/2013 Document Reviewed: 04/13/2009 °[employer]® Patient Information ©2015 [employer]. This information is not intended to replace advice given to you by your health care provider. Make sure you discuss any questions you have with your health care provider. ° °

## 2014-11-27 NOTE — ED Notes (Signed)
Pt reports suprapubic pain x 1 week, went to see a GI MD Wednesday and had blood work done-WNL.  Pt reports around 0500 yesterday, she started to have "jerky" movement in bila legs.

## 2014-12-01 DIAGNOSIS — Z6823 Body mass index (BMI) 23.0-23.9, adult: Secondary | ICD-10-CM | POA: Diagnosis not present

## 2014-12-01 DIAGNOSIS — K760 Fatty (change of) liver, not elsewhere classified: Secondary | ICD-10-CM | POA: Diagnosis not present

## 2014-12-01 DIAGNOSIS — M5380 Other specified dorsopathies, site unspecified: Secondary | ICD-10-CM | POA: Diagnosis not present

## 2014-12-01 DIAGNOSIS — M62838 Other muscle spasm: Secondary | ICD-10-CM | POA: Diagnosis not present

## 2014-12-01 DIAGNOSIS — R102 Pelvic and perineal pain: Secondary | ICD-10-CM | POA: Diagnosis not present

## 2014-12-21 ENCOUNTER — Emergency Department (HOSPITAL_COMMUNITY): Payer: Medicare Other

## 2014-12-21 ENCOUNTER — Emergency Department (HOSPITAL_COMMUNITY)
Admission: EM | Admit: 2014-12-21 | Discharge: 2014-12-21 | Disposition: A | Discharge status: Home / Self Care | Payer: Medicare Other | Attending: Emergency Medicine | Admitting: Emergency Medicine

## 2014-12-21 DIAGNOSIS — I10 Essential (primary) hypertension: Secondary | ICD-10-CM | POA: Insufficient documentation

## 2014-12-21 DIAGNOSIS — M79601 Pain in right arm: Secondary | ICD-10-CM | POA: Insufficient documentation

## 2014-12-21 DIAGNOSIS — J45909 Unspecified asthma, uncomplicated: Secondary | ICD-10-CM | POA: Insufficient documentation

## 2014-12-21 DIAGNOSIS — Z79899 Other long term (current) drug therapy: Secondary | ICD-10-CM | POA: Diagnosis not present

## 2014-12-21 DIAGNOSIS — Z7982 Long term (current) use of aspirin: Secondary | ICD-10-CM | POA: Insufficient documentation

## 2014-12-21 DIAGNOSIS — M1990 Unspecified osteoarthritis, unspecified site: Secondary | ICD-10-CM | POA: Diagnosis not present

## 2014-12-21 DIAGNOSIS — Z8669 Personal history of other diseases of the nervous system and sense organs: Secondary | ICD-10-CM | POA: Diagnosis not present

## 2014-12-21 DIAGNOSIS — E785 Hyperlipidemia, unspecified: Secondary | ICD-10-CM | POA: Insufficient documentation

## 2014-12-21 DIAGNOSIS — R200 Anesthesia of skin: Secondary | ICD-10-CM | POA: Diagnosis present

## 2014-12-21 DIAGNOSIS — K219 Gastro-esophageal reflux disease without esophagitis: Secondary | ICD-10-CM | POA: Insufficient documentation

## 2014-12-21 DIAGNOSIS — M19011 Primary osteoarthritis, right shoulder: Secondary | ICD-10-CM | POA: Diagnosis not present

## 2014-12-21 DIAGNOSIS — Z7951 Long term (current) use of inhaled steroids: Secondary | ICD-10-CM | POA: Diagnosis not present

## 2014-12-21 DIAGNOSIS — R52 Pain, unspecified: Secondary | ICD-10-CM

## 2014-12-21 DIAGNOSIS — K5900 Constipation, unspecified: Secondary | ICD-10-CM | POA: Insufficient documentation

## 2014-12-21 MED ORDER — TRAMADOL HCL 50 MG PO TABS: 50.0000 mg | ORAL_TABLET | Freq: Four times a day (QID) | ORAL | Status: DC | PRN

## 2014-12-21 MED ORDER — ACETAMINOPHEN 325 MG PO TABS
650.0000 mg | ORAL_TABLET | Freq: Once | ORAL | Status: AC
  Administered 2014-12-21: 650 mg via ORAL
  Filled 2014-12-21: qty 2

## 2014-12-21 NOTE — Discharge Instructions (Signed)
xrays normal  Please call your doctor for a followup appointment within 24-48 hours. When you talk to your doctor please let them know that you were seen in the emergency department and have them acquire all of your records so that they can discuss the findings with you and formulate a treatment plan to fully care for your new and ongoing problems.

## 2014-12-21 NOTE — ED Notes (Signed)
Patient went to X-Ray

## 2014-12-21 NOTE — ED Notes (Signed)
Patient is alert and orientated.  She is ambulatory and able to walk out of ER.  She denies any N/V or dizzy spells.

## 2014-12-21 NOTE — ED Notes (Addendum)
Pt reports right arm/shoulder pain with elevation of arm while combing hair onset 1130 today; pt reports pain radiates to right side of face and "makes it feel funny." Upon neuro assessment no numbness or weakness noted; unremarkable. Blanchie Dessert MD notified of the above and states is aware of pt and screened them in triage per triage nurse request at time of arrival.

## 2014-12-21 NOTE — ED Notes (Signed)
Notified MD about pt symptoms.  Per Dr. Maryan Rued, ok to give tylenol and take pt back to lobby.

## 2014-12-21 NOTE — ED Provider Notes (Signed)
CSN: [id number]     Arrival date & time 12/21/14  1225 History   First MD Initiated Contact with Patient 12/21/14 1545     Chief Complaint  Patient presents with  . Numbness     (Consider location/radiation/quality/duration/timing/severity/associated sxs/prior Treatment) HPI Comments: The patient is a 73 year old female who presented to the hospital with a complaint of right upper arm pain which occurred earlier today when she was lifting her arm. She does not remember what she was doing but thinks she may have been combing through her hair. She had acute onset of pain in the anterior proximal forearm just distal to the shoulder, this was sharp and shooting, went up into her neck and her face, lasted for several seconds and improved immediately when the patient lowered her arm. This has occurred several times since then each time with movement. She denies numbness or weakness of the arm, facial droop, blurred vision, difficulty with speech or coordination. She took Tylenol prior to arrival with minimal improvement, declines other medications for the pain  The history is provided by the patient and the spouse.    Past Medical History  Diagnosis Date  . Hyperlipidemia   . Hypertension   . GERD (gastroesophageal reflux disease)   . Allergic rhinitis   . Osteoporosis   . OA (osteoarthritis)   . Asthma   . Vitamin D deficiency   . Carotid stenosis     ICA(L)  . Neuropathy     face (R)  . Constipation    Past Surgical History  Procedure Laterality Date  . Cesarean section    . Cesarean section     Family History  Problem Relation Age of Onset  . Diabetes Mellitus II Father   . Diabetes Mellitus II Mother   . Osteoporosis Mother   . Hypertension Mother   . CVA Sister     4  . CAD Sister     4  . Hypertension Sister     4  . Osteoarthritis Sister     4  . Diabetes Mellitus II Sister     4  . Dementia Sister     decreased  . Alcoholism Brother    History  Substance Use  Topics  . Smoking status: Never Smoker   . Smokeless tobacco: Never Used  . Alcohol Use: No   OB History    No data available     Review of Systems  All other systems reviewed and are negative.     Allergies  Naproxen and Prednisone  Home Medications   Prior to Admission medications   Medication Sig Start Date End Date Taking? Authorizing Provider  acetaminophen (TYLENOL) 500 MG tablet Take 1,000 mg by mouth every 6 (six) hours as needed for mild pain.   Yes Historical Provider, MD  AMLODIPINE BESYLATE PO Take 10 mg by mouth daily.   Yes Historical Provider, MD  aspirin 81 MG tablet Take 81 mg by mouth daily.   Yes Historical Provider, MD  carboxymethylcellulose (REFRESH PLUS) 0.5 % SOLN 1 drop daily. As directed   Yes Historical Provider, MD  cycloSPORINE (RESTASIS) 0.05 % ophthalmic emulsion Place 1 drop into both eyes 2 (two) times daily. 1gtt OU bid   Yes Historical Provider, MD  fish oil-omega-3 fatty acids 1000 MG capsule Take 1 g by mouth daily.   Yes Historical Provider, MD  fluticasone (FLONASE) 50 MCG/ACT nasal spray Place 2 sprays into both nostrils daily as needed for allergies.  Yes Historical Provider, MD  lansoprazole (PREVACID) 15 MG capsule Take 15 mg by mouth daily as needed (acid reflux).    Yes Historical Provider, MD  losartan (COZAAR) 25 MG tablet Take 25 mg by mouth daily.   Yes Historical Provider, MD  magnesium 30 MG tablet Take 30 mg by mouth daily.   Yes Historical Provider, MD  Multiple Vitamins-Minerals (MULTIVITAMIN WITH MINERALS) tablet Take 1 tablet by mouth daily.   Yes Historical Provider, MD  Pitavastatin Calcium (LIVALO PO) Take 1 tablet by mouth daily.   Yes Historical Provider, MD  polyethylene glycol (MIRALAX / GLYCOLAX) packet Take 17 g by mouth daily as needed for mild constipation.   Yes Historical Provider, MD  PRESCRIPTION MEDICATION Apply 1 application topically daily as needed (excema). Patient unsure of medication name   Yes  Historical Provider, MD  pyridOXINE (VITAMIN B-6) 100 MG tablet Take 100 mg by mouth daily.   Yes Historical Provider, MD  Soap & Cleansers (AQUANIL SKIN CLEANSER EX) Apply 1 application topically daily as needed (facial cleanser).   Yes Historical Provider, MD  spironolactone (ALDACTONE) 50 MG tablet Take 50 mg by mouth 2 (two) times daily.   Yes Historical Provider, MD  triamcinolone cream (KENALOG) 0.1 % Apply 1 application topically See admin instructions. To affected area as needed to rash 11/01/14  Yes Historical Provider, MD  vitamin B-12 (CYANOCOBALAMIN) 1000 MCG tablet Take 1,000 mcg by mouth daily.   Yes Historical Provider, MD  vitamin E 100 UNIT capsule Take 100 Units by mouth daily.   Yes Historical Provider, MD   BP 120/70 mmHg  Pulse 66  Temp(Src) 98 F (36.7 C) (Oral)  Resp 18  SpO2 99% Physical Exam  Constitutional: She appears well-developed and well-nourished. No distress.  HENT:  Head: Normocephalic and atraumatic.  Mouth/Throat: Oropharynx is clear and moist. No oropharyngeal exudate.  Eyes: Conjunctivae and EOM are normal. Pupils are equal, round, and reactive to light. Right eye exhibits no discharge. Left eye exhibits no discharge. No scleral icterus.  Neck: Normal range of motion. Neck supple. No JVD present. No thyromegaly present.  Cardiovascular: Normal rate, regular rhythm, normal heart sounds and intact distal pulses.  Exam reveals no gallop and no friction rub.   No murmur heard. Pulmonary/Chest: Effort normal and breath sounds normal. No respiratory distress. She has no wheezes. She has no rales.  Abdominal: Soft. Bowel sounds are normal. She exhibits no distension and no mass. There is no tenderness.  Musculoskeletal: Normal range of motion. She exhibits tenderness (tender to palpation in the right proximal anterior humerus, this is just distal to the deltoid muscle over the proximal biceps tendon). She exhibits no edema.  Pain is reproducible with movements  of the bicep muscle against resistance as well as with some range of motion of the shoulder  Lymphadenopathy:    She has no cervical adenopathy.  Neurological: She is alert. Coordination normal.  Normal speech, coordination, sensation and strength in all 4 extremities, cranial nerves III through XII intact area  Skin: Skin is warm and dry. No rash noted. No erythema.  Psychiatric: She has a normal mood and affect. Her behavior is normal.  Nursing note and vitals reviewed.   ED Course  Procedures (including critical care time) Labs Review Labs Reviewed - No data to display  Imaging Review No results found.    MDM   Final diagnoses:  Pain    Normal neurologic exam, normal pulses at the right upper extremity, reported visual  pain with palpation and range of motion. Imaging to rule out underlying bony injury, suspect muscle strain or tendon strain, patient declines medications when offered, will use ice pack.  Xray neg,I have personally viewed and interpreted the imaging and agree with radiologist interpretation.   Meds given in ED:  Medications  acetaminophen (TYLENOL) tablet 650 mg (650 mg Oral Given 12/21/14 1300)    New Prescriptions   TRAMADOL (ULTRAM) 50 MG TABLET    Take 1 tablet (50 mg total) by mouth every 6 (six) hours as needed.      Noemi Chapel, MD 12/21/14 Lurena Nida

## 2014-12-21 NOTE — ED Notes (Addendum)
Pt states rt arm pain starting at 11:30 am.  Pain is worse with movement.  Lifting arm increases pain.  Pt was attempting to get dressed when pain began.  Pt also states she feels funny in rt side of face.  Denies chest pain. Pt with quick neuro WNL.  No n/v/d.  No other complaints.

## 2014-12-23 DIAGNOSIS — K589 Irritable bowel syndrome without diarrhea: Secondary | ICD-10-CM | POA: Diagnosis not present

## 2014-12-23 DIAGNOSIS — R102 Pelvic and perineal pain: Secondary | ICD-10-CM | POA: Diagnosis not present

## 2014-12-27 DIAGNOSIS — R102 Pelvic and perineal pain: Secondary | ICD-10-CM | POA: Diagnosis not present

## 2015-01-12 DIAGNOSIS — G514 Facial myokymia: Secondary | ICD-10-CM | POA: Diagnosis not present

## 2015-01-12 DIAGNOSIS — H04123 Dry eye syndrome of bilateral lacrimal glands: Secondary | ICD-10-CM | POA: Diagnosis not present

## 2015-03-02 ENCOUNTER — Telehealth: Payer: Self-pay

## 2015-03-02 ENCOUNTER — Ambulatory Visit: Payer: Medicare Other | Admitting: Adult Health

## 2015-03-02 NOTE — Telephone Encounter (Signed)
Patient no- showed apt. Today with Megan.

## 2015-03-03 ENCOUNTER — Encounter: Payer: Self-pay | Admitting: Adult Health

## 2015-03-07 NOTE — Telephone Encounter (Signed)
Patient called back regarding no show appt. Patient doesn't remember scheduling this appt. When saw Dr. Rexene Alberts at last visit, Dr. Rexene Alberts didn't need to see for 6 months (appointment was confirmed with patient).

## 2015-03-08 NOTE — Telephone Encounter (Signed)
Called patient and spoke to her she stated she did not know that she had apt. Patient does not want to schedule at this time because she is doing well. Patient will call back if she feels she needs a apt scheduled.

## 2015-03-20 DIAGNOSIS — E559 Vitamin D deficiency, unspecified: Secondary | ICD-10-CM | POA: Diagnosis not present

## 2015-03-20 DIAGNOSIS — R739 Hyperglycemia, unspecified: Secondary | ICD-10-CM | POA: Diagnosis not present

## 2015-03-20 DIAGNOSIS — M810 Age-related osteoporosis without current pathological fracture: Secondary | ICD-10-CM | POA: Diagnosis not present

## 2015-03-20 DIAGNOSIS — E785 Hyperlipidemia, unspecified: Secondary | ICD-10-CM | POA: Diagnosis not present

## 2015-03-20 DIAGNOSIS — Z0000 Encounter for general adult medical examination without abnormal findings: Secondary | ICD-10-CM | POA: Diagnosis not present

## 2015-03-20 DIAGNOSIS — I10 Essential (primary) hypertension: Secondary | ICD-10-CM | POA: Diagnosis not present

## 2015-03-24 DIAGNOSIS — I10 Essential (primary) hypertension: Secondary | ICD-10-CM | POA: Diagnosis not present

## 2015-03-24 DIAGNOSIS — K219 Gastro-esophageal reflux disease without esophagitis: Secondary | ICD-10-CM | POA: Diagnosis not present

## 2015-03-24 DIAGNOSIS — I959 Hypotension, unspecified: Secondary | ICD-10-CM | POA: Diagnosis not present

## 2015-03-24 DIAGNOSIS — R51 Headache: Secondary | ICD-10-CM | POA: Diagnosis not present

## 2015-03-24 DIAGNOSIS — Z6824 Body mass index (BMI) 24.0-24.9, adult: Secondary | ICD-10-CM | POA: Diagnosis not present

## 2015-03-24 DIAGNOSIS — Z23 Encounter for immunization: Secondary | ICD-10-CM | POA: Diagnosis not present

## 2015-03-24 DIAGNOSIS — M542 Cervicalgia: Secondary | ICD-10-CM | POA: Diagnosis not present

## 2015-03-29 DIAGNOSIS — K5900 Constipation, unspecified: Secondary | ICD-10-CM | POA: Diagnosis not present

## 2015-03-29 DIAGNOSIS — Z0000 Encounter for general adult medical examination without abnormal findings: Secondary | ICD-10-CM | POA: Diagnosis not present

## 2015-03-29 DIAGNOSIS — I272 Other secondary pulmonary hypertension: Secondary | ICD-10-CM | POA: Diagnosis not present

## 2015-03-29 DIAGNOSIS — M5380 Other specified dorsopathies, site unspecified: Secondary | ICD-10-CM | POA: Diagnosis not present

## 2015-03-29 DIAGNOSIS — I6529 Occlusion and stenosis of unspecified carotid artery: Secondary | ICD-10-CM | POA: Diagnosis not present

## 2015-03-29 DIAGNOSIS — K644 Residual hemorrhoidal skin tags: Secondary | ICD-10-CM | POA: Diagnosis not present

## 2015-03-29 DIAGNOSIS — R0982 Postnasal drip: Secondary | ICD-10-CM | POA: Diagnosis not present

## 2015-03-29 DIAGNOSIS — J45909 Unspecified asthma, uncomplicated: Secondary | ICD-10-CM | POA: Diagnosis not present

## 2015-03-29 DIAGNOSIS — Z6824 Body mass index (BMI) 24.0-24.9, adult: Secondary | ICD-10-CM | POA: Diagnosis not present

## 2015-03-29 DIAGNOSIS — K760 Fatty (change of) liver, not elsewhere classified: Secondary | ICD-10-CM | POA: Diagnosis not present

## 2015-03-29 DIAGNOSIS — Z1389 Encounter for screening for other disorder: Secondary | ICD-10-CM | POA: Diagnosis not present

## 2015-04-03 DIAGNOSIS — R0982 Postnasal drip: Secondary | ICD-10-CM | POA: Diagnosis not present

## 2015-04-03 DIAGNOSIS — H903 Sensorineural hearing loss, bilateral: Secondary | ICD-10-CM | POA: Diagnosis not present

## 2015-04-11 DIAGNOSIS — K589 Irritable bowel syndrome without diarrhea: Secondary | ICD-10-CM | POA: Diagnosis not present

## 2015-04-11 DIAGNOSIS — K5900 Constipation, unspecified: Secondary | ICD-10-CM | POA: Diagnosis not present

## 2015-04-18 DIAGNOSIS — I10 Essential (primary) hypertension: Secondary | ICD-10-CM | POA: Diagnosis not present

## 2015-04-18 DIAGNOSIS — Z6824 Body mass index (BMI) 24.0-24.9, adult: Secondary | ICD-10-CM | POA: Diagnosis not present

## 2015-04-18 DIAGNOSIS — M79641 Pain in right hand: Secondary | ICD-10-CM | POA: Diagnosis not present

## 2015-04-18 DIAGNOSIS — M1990 Unspecified osteoarthritis, unspecified site: Secondary | ICD-10-CM | POA: Diagnosis not present

## 2015-05-01 DIAGNOSIS — Z6824 Body mass index (BMI) 24.0-24.9, adult: Secondary | ICD-10-CM | POA: Diagnosis not present

## 2015-05-01 DIAGNOSIS — M79644 Pain in right finger(s): Secondary | ICD-10-CM | POA: Diagnosis not present

## 2015-05-01 DIAGNOSIS — L089 Local infection of the skin and subcutaneous tissue, unspecified: Secondary | ICD-10-CM | POA: Diagnosis not present

## 2015-05-02 DIAGNOSIS — L03011 Cellulitis of right finger: Secondary | ICD-10-CM | POA: Diagnosis not present

## 2015-05-09 DIAGNOSIS — Z4789 Encounter for other orthopedic aftercare: Secondary | ICD-10-CM | POA: Diagnosis not present

## 2015-05-09 DIAGNOSIS — L03011 Cellulitis of right finger: Secondary | ICD-10-CM | POA: Diagnosis not present

## 2015-05-19 DIAGNOSIS — L03011 Cellulitis of right finger: Secondary | ICD-10-CM | POA: Diagnosis not present

## 2015-05-19 DIAGNOSIS — Z4789 Encounter for other orthopedic aftercare: Secondary | ICD-10-CM | POA: Diagnosis not present

## 2015-05-23 ENCOUNTER — Other Ambulatory Visit: Payer: Self-pay

## 2015-05-23 DIAGNOSIS — Z1231 Encounter for screening mammogram for malignant neoplasm of breast: Secondary | ICD-10-CM

## 2015-05-25 DIAGNOSIS — G514 Facial myokymia: Secondary | ICD-10-CM | POA: Diagnosis not present

## 2015-05-25 DIAGNOSIS — H40023 Open angle with borderline findings, high risk, bilateral: Secondary | ICD-10-CM | POA: Diagnosis not present

## 2015-05-25 DIAGNOSIS — H04123 Dry eye syndrome of bilateral lacrimal glands: Secondary | ICD-10-CM | POA: Diagnosis not present

## 2015-05-25 DIAGNOSIS — H02403 Unspecified ptosis of bilateral eyelids: Secondary | ICD-10-CM | POA: Diagnosis not present

## 2015-05-30 ENCOUNTER — Ambulatory Visit
Admission: RE | Admit: 2015-05-30 | Discharge: 2015-05-30 | Disposition: A | Discharge status: Home / Self Care | Payer: Medicare Other | Source: Ambulatory Visit

## 2015-05-30 DIAGNOSIS — Z1231 Encounter for screening mammogram for malignant neoplasm of breast: Secondary | ICD-10-CM | POA: Diagnosis not present

## 2015-07-27 DIAGNOSIS — H25011 Cortical age-related cataract, right eye: Secondary | ICD-10-CM | POA: Diagnosis not present

## 2015-07-27 DIAGNOSIS — H40023 Open angle with borderline findings, high risk, bilateral: Secondary | ICD-10-CM | POA: Diagnosis not present

## 2015-07-27 DIAGNOSIS — H35033 Hypertensive retinopathy, bilateral: Secondary | ICD-10-CM | POA: Diagnosis not present

## 2015-07-27 DIAGNOSIS — H2511 Age-related nuclear cataract, right eye: Secondary | ICD-10-CM | POA: Diagnosis not present

## 2015-08-15 DIAGNOSIS — H2511 Age-related nuclear cataract, right eye: Secondary | ICD-10-CM | POA: Diagnosis not present

## 2015-10-06 DIAGNOSIS — I499 Cardiac arrhythmia, unspecified: Secondary | ICD-10-CM | POA: Diagnosis not present

## 2015-10-06 DIAGNOSIS — I10 Essential (primary) hypertension: Secondary | ICD-10-CM | POA: Diagnosis not present

## 2015-10-06 DIAGNOSIS — I272 Other secondary pulmonary hypertension: Secondary | ICD-10-CM | POA: Diagnosis not present

## 2015-10-06 DIAGNOSIS — Z6824 Body mass index (BMI) 24.0-24.9, adult: Secondary | ICD-10-CM | POA: Diagnosis not present

## 2015-10-06 DIAGNOSIS — I6529 Occlusion and stenosis of unspecified carotid artery: Secondary | ICD-10-CM | POA: Diagnosis not present

## 2015-10-16 DIAGNOSIS — L309 Dermatitis, unspecified: Secondary | ICD-10-CM | POA: Diagnosis not present

## 2015-10-16 DIAGNOSIS — L299 Pruritus, unspecified: Secondary | ICD-10-CM | POA: Diagnosis not present

## 2015-12-29 DIAGNOSIS — M1712 Unilateral primary osteoarthritis, left knee: Secondary | ICD-10-CM | POA: Diagnosis not present

## 2015-12-29 DIAGNOSIS — M170 Bilateral primary osteoarthritis of knee: Secondary | ICD-10-CM | POA: Diagnosis not present

## 2015-12-29 DIAGNOSIS — M1711 Unilateral primary osteoarthritis, right knee: Secondary | ICD-10-CM | POA: Diagnosis not present

## 2016-01-20 ENCOUNTER — Emergency Department (HOSPITAL_COMMUNITY)
Admission: EM | Admit: 2016-01-20 | Discharge: 2016-01-20 | Disposition: A | Discharge status: Home / Self Care | Payer: Medicare Other | Attending: Emergency Medicine | Admitting: Emergency Medicine

## 2016-01-20 ENCOUNTER — Encounter (HOSPITAL_COMMUNITY): Payer: Self-pay | Admitting: Emergency Medicine

## 2016-01-20 ENCOUNTER — Emergency Department (HOSPITAL_COMMUNITY): Payer: Medicare Other

## 2016-01-20 DIAGNOSIS — Z79891 Long term (current) use of opiate analgesic: Secondary | ICD-10-CM | POA: Insufficient documentation

## 2016-01-20 DIAGNOSIS — Z79899 Other long term (current) drug therapy: Secondary | ICD-10-CM | POA: Diagnosis not present

## 2016-01-20 DIAGNOSIS — I10 Essential (primary) hypertension: Secondary | ICD-10-CM | POA: Diagnosis not present

## 2016-01-20 DIAGNOSIS — R079 Chest pain, unspecified: Secondary | ICD-10-CM | POA: Diagnosis not present

## 2016-01-20 DIAGNOSIS — R42 Dizziness and giddiness: Secondary | ICD-10-CM | POA: Insufficient documentation

## 2016-01-20 DIAGNOSIS — J45909 Unspecified asthma, uncomplicated: Secondary | ICD-10-CM | POA: Diagnosis not present

## 2016-01-20 DIAGNOSIS — Z7982 Long term (current) use of aspirin: Secondary | ICD-10-CM | POA: Insufficient documentation

## 2016-01-20 LAB — BASIC METABOLIC PANEL
Anion gap: 10 (ref 5–15)
BUN: 15 mg/dL (ref 6–20)
CALCIUM: 9.9 mg/dL (ref 8.9–10.3)
CHLORIDE: 102 mmol/L (ref 101–111)
CO2: 24 mmol/L (ref 22–32)
CREATININE: 0.88 mg/dL (ref 0.44–1.00)
GFR calc non Af Amer: >60 mL/min (ref >60)
GLUCOSE: 110 mg/dL — AB (ref 65–99)
Potassium: 4.5 mmol/L (ref 3.5–5.1)
Sodium: 136 mmol/L (ref 135–145)

## 2016-01-20 LAB — I-STAT TROPONIN, ED: TROPONIN I, POC: 0 ng/mL (ref 0.00–0.08)

## 2016-01-20 LAB — URINALYSIS, ROUTINE W REFLEX MICROSCOPIC
BILIRUBIN URINE: NEGATIVE
GLUCOSE, UA: NEGATIVE mg/dL
HGB URINE DIPSTICK: NEGATIVE
KETONES UR: NEGATIVE mg/dL
Leukocytes, UA: NEGATIVE
Nitrite: NEGATIVE
Protein, ur: NEGATIVE mg/dL
Specific Gravity, Urine: 1.004 — ABNORMAL LOW (ref 1.005–1.030)
pH: 7.5 (ref 5.0–8.0)

## 2016-01-20 LAB — CBC
HCT: 41 % (ref 36.0–46.0)
Hemoglobin: 13.6 g/dL (ref 12.0–15.0)
MCH: 28.9 pg (ref 26.0–34.0)
MCHC: 33.2 g/dL (ref 30.0–36.0)
MCV: 87.2 fL (ref 78.0–100.0)
Platelets: 271 10*3/uL (ref 150–400)
RBC: 4.7 MIL/uL (ref 3.87–5.11)
RDW: 13.8 % (ref 11.5–15.5)
WBC: 5.7 10*3/uL (ref 4.0–10.5)

## 2016-01-20 NOTE — Discharge Instructions (Signed)
You have been seen today in the Emergency Department (ED)  for lightheadedness.  Your workup including labs and EKG show reassuring results.  Your symptoms may be due to dehydration, so it is important that you drink plenty of non-alcoholic fluids.  Please call your regular doctor as soon as possible to schedule the next available clinic appointment to follow up with him/her regarding your visit to the ED and your symptoms.  Return to the Emergency Department (ED)  if you have any further lightheaded episodes or develop ANY chest pain, pressure, tightness, trouble breathing, sudden sweating, or other symptoms that concern you.

## 2016-01-20 NOTE — ED Triage Notes (Signed)
Patient reports increasing BP x1 week. History of hypertension but reports at home BP was 160s/80s, which is elevated for her. Reports intermittent dizziness and left sided chest ache. Denies SOB, N/V or weakness. A&O x4.

## 2016-01-20 NOTE — ED Provider Notes (Signed)
Emergency Department Provider Note   I have reviewed the triage vital signs and the nursing notes.   HISTORY  Chief Complaint Hypertension and Dizziness   HPI Sara Brown is a 75 y.o. female with PMH of asthma, carotid stenosis, GERD, HLD, HTN presents to the emergency department for evaluation of intermittent vertigo and elevated blood pressures at home. Patient states her blood pressures have been running SBP 160's. She has been compliant with her blood pressure medication. She reports intermittent vertigo made worse with standing. She denies nausea and vomiting. No chest pain. She is able to walk and it typically resolves without intervention. She had some mild vertigo symptoms this morning which prompted her emergency department evaluation. She denies headache but states that she feels an uncomfortable sensation in her forehead. No neck stiffness or discomfort. No fever or chills.   Patient did report a momentary episode of chest pain in the waiting room.    Past Medical History:  Diagnosis Date  . Allergic rhinitis   . Asthma   . Carotid stenosis    ICA(L)  . Constipation   . GERD (gastroesophageal reflux disease)   . Hyperlipidemia   . Hypertension   . Neuropathy ([place])    face (R)  . OA (osteoarthritis)   . Osteoporosis   . Vitamin D deficiency     Patient Active Problem List   Diagnosis Date Noted  . GERD (gastroesophageal reflux disease)   . Shortness of breath 07/30/2007  . COUGH 07/30/2007    Past Surgical History:  Procedure Laterality Date  . CESAREAN SECTION    . CESAREAN SECTION      Current Outpatient Rx  . Order #: [id number] Class: Historical Med  . Order #: [id number] Class: Historical Med  . Order #: [id number] Class: Historical Med  . Order #: [id number] Class: Historical Med  . Order #: [id number] Class: Historical Med  . Order #: [id number] Class: Historical Med  . Order #: [id number] Class: Historical Med  . Order #: [id number] Class: Historical  Med  . Order #: [id number] Class: Historical Med  . Order #: [id number] Class: Historical Med  . Order #: [id number] Class: Historical Med  . Order #: [id number] Class: Historical Med  . Order #: [id number] Class: Historical Med  . Order #: [id number] Class: Historical Med  . Order #: [id number] Class: Historical Med  . Order #: [id number] Class: Historical Med  . Order #: [id number] Class: Historical Med  . Order #: [id number] Class: Historical Med  . Order #: [id number] Class: Historical Med  . Order #: [id number] Class: Historical Med  . Order #: [id number] Class: Historical Med  . Order #: [id number] Class: Historical Med    Allergies Naproxen and Prednisone  Family History  Problem Relation Age of Onset  . Diabetes Mellitus II Mother   . Osteoporosis Mother   . Hypertension Mother   . Diabetes Mellitus II Father   . CVA Sister     4  . CAD Sister     4  . Hypertension Sister     4  . Osteoarthritis Sister     4  . Diabetes Mellitus II Sister     4  . Dementia Sister     decreased  . Alcoholism Brother     Social History Social History  Substance Use Topics  . Smoking status: Never Smoker  . Smokeless tobacco: Never Used  . Alcohol use No    Review of Systems  Constitutional: No fever/chills Eyes: No visual changes. ENT: No sore throat. Positive intermittent vertigo.  Cardiovascular: Positive chest pain. Respiratory: Denies shortness  of breath. Gastrointestinal: No abdominal pain.  No nausea, no vomiting.  No diarrhea.  No constipation. Genitourinary: Negative for dysuria. Musculoskeletal: Negative for back pain. Skin: Negative for rash. Neurological: Negative for headaches, focal weakness or numbness.   10-point ROS otherwise negative.  ____________________________________________   PHYSICAL EXAM:  VITAL SIGNS: ED Triage Vitals [01/20/16 1317]  Enc Vitals Group     BP 139/70     Pulse Rate 77     Resp 18     Temp 98 F (36.7 C)     Temp Source Oral     SpO2 100 %     Constitutional: Alert and oriented. Well appearing and in no acute distress. Eyes: Conjunctivae are normal. PERRL. EOMI. Head: Atraumatic. Nose: No congestion/rhinnorhea. Mouth/Throat: Mucous membranes are moist.  Oropharynx non-erythematous. Neck: No stridor.  No meningeal signs.   Cardiovascular: Normal rate, regular rhythm. Good peripheral circulation. Grossly normal heart sounds.   Respiratory: Normal respiratory effort.  No retractions. Lungs CTAB. Gastrointestinal: Soft and nontender. No distention.  Musculoskeletal: No lower extremity tenderness nor edema. No gross deformities of extremities. Neurologic:  Normal speech and language. No gross focal neurologic deficits are appreciated. Normal gait. Normal finger-to-nose. No pronator drift.  Skin:  Skin is warm, dry and intact. No rash noted. Psychiatric: Mood and affect are normal. Speech and behavior are normal.  ____________________________________________   LABS (all labs ordered are listed, but only abnormal results are displayed)  Labs Reviewed  BASIC METABOLIC PANEL - Abnormal; Notable for the following:       Result Value   Glucose, Bld 110 (*)    All other components within normal limits  URINALYSIS, ROUTINE W REFLEX MICROSCOPIC (NOT AT [facility]) - Abnormal; Notable for the following:    Specific Gravity, Urine 1.004 (*)    All other components within normal limits  CBC  I-STAT TROPOININ, ED   ____________________________________________  EKG  Reviewed in MUSE. No STEMI.  ____________________________________________  RADIOLOGY  Dg Chest 2 View  Result Date: 01/20/2016 CLINICAL DATA:  75 year old female with sudden onset central chest pain today. Initial encounter. EXAM: CHEST  2 VIEW COMPARISON:  CT Abdomen and Pelvis 11/27/2014.  Chest CT 05/19/2003. FINDINGS: Upper limits of normal to mildly hyperinflated lung volumes, with some attenuation of upper lobe perihilar bronchovascular markings, raising the  possibility of emphysema. No pneumothorax or pulmonary edema. No pleural effusion or confluent pulmonary opacity. Cardiac size at the upper limits of normal. Other mediastinal contours are within normal limits. Visualized tracheal air column is within normal limits. No acute osseous abnormality identified. Negative bowel gas pattern. IMPRESSION: No acute cardiopulmonary abnormality. Electronically Signed   By: Genevie Ann M.D.   On: 01/20/2016 14:24    ____________________________________________   PROCEDURES  Procedure(s) performed:   Procedures  None ____________________________________________   INITIAL IMPRESSION / ASSESSMENT AND PLAN / ED COURSE  Pertinent labs & imaging results that were available during my care of the patient were reviewed by me and considered in my medical decision making (see chart for details).  Patient presents to the emergency department for evaluation of intermittent vertigo and single episode of brief chest pain in the emergency department. No dyspnea. Patient has intact neurological exam including cerebellar testing. Negative Dicks Hallpike. Low suspicion for central vertigo or stroke. EKG is unremarkable. Plan for baseline labs and urinalysis revealed tenderness overlying infection or anemia. Patient concerned about elevated BP at home but BP is under control here in the ED.   04:01 PM Patient  is feeling better. Neurological exam unchanged. Unremarkable labs and EKG. Plan for discharge home with primary care physician follow-up. I very low suspicion for central cause of the patient's symptoms. Will discharge at this time with strict return precautions.  ____________________________________________  FINAL CLINICAL IMPRESSION(S) / ED DIAGNOSES  Final diagnoses:  Lightheadedness     MEDICATIONS GIVEN DURING THIS VISIT:  None  NEW OUTPATIENT MEDICATIONS STARTED DURING THIS VISIT:  None   Note:  This document was prepared using Dragon voice  recognition software and may include unintentional dictation errors.  Nanda Quinton, MD Emergency Medicine   Margette Fast, MD 01/20/16 [phone]

## 2016-01-24 DIAGNOSIS — R42 Dizziness and giddiness: Secondary | ICD-10-CM | POA: Diagnosis not present

## 2016-01-24 DIAGNOSIS — R0982 Postnasal drip: Secondary | ICD-10-CM | POA: Diagnosis not present

## 2016-01-24 DIAGNOSIS — I10 Essential (primary) hypertension: Secondary | ICD-10-CM | POA: Diagnosis not present

## 2016-01-24 DIAGNOSIS — Z6824 Body mass index (BMI) 24.0-24.9, adult: Secondary | ICD-10-CM | POA: Diagnosis not present

## 2016-02-15 ENCOUNTER — Encounter (HOSPITAL_COMMUNITY): Payer: Self-pay | Admitting: Emergency Medicine

## 2016-02-15 ENCOUNTER — Emergency Department (HOSPITAL_COMMUNITY)
Admission: EM | Admit: 2016-02-15 | Discharge: 2016-02-15 | Discharge status: Against Medical Advice | Payer: Medicare Other | Attending: Dermatology | Admitting: Dermatology

## 2016-02-15 DIAGNOSIS — Z79899 Other long term (current) drug therapy: Secondary | ICD-10-CM | POA: Diagnosis not present

## 2016-02-15 DIAGNOSIS — Z7982 Long term (current) use of aspirin: Secondary | ICD-10-CM | POA: Diagnosis not present

## 2016-02-15 DIAGNOSIS — J45909 Unspecified asthma, uncomplicated: Secondary | ICD-10-CM | POA: Insufficient documentation

## 2016-02-15 DIAGNOSIS — Z5321 Procedure and treatment not carried out due to patient leaving prior to being seen by health care provider: Secondary | ICD-10-CM | POA: Diagnosis not present

## 2016-02-15 DIAGNOSIS — I10 Essential (primary) hypertension: Secondary | ICD-10-CM | POA: Insufficient documentation

## 2016-02-15 NOTE — ED Triage Notes (Signed)
Pt states she is not feeling well and checked her blood pressure at home and it was 164/98  Pt states the top of her head hurts and she the left side of her tongue feels funny  Speech is clear and no neuro deficits noted  B/P in triage 146/81

## 2016-02-15 NOTE — ED Notes (Signed)
Pt states she is going home and is seeing her dr. In the morning

## 2016-02-16 DIAGNOSIS — I10 Essential (primary) hypertension: Secondary | ICD-10-CM | POA: Diagnosis not present

## 2016-02-16 DIAGNOSIS — Z6824 Body mass index (BMI) 24.0-24.9, adult: Secondary | ICD-10-CM | POA: Diagnosis not present

## 2016-02-21 DIAGNOSIS — H90A32 Mixed conductive and sensorineural hearing loss, unilateral, left ear with restricted hearing on the contralateral side: Secondary | ICD-10-CM | POA: Diagnosis not present

## 2016-02-21 DIAGNOSIS — H9202 Otalgia, left ear: Secondary | ICD-10-CM | POA: Insufficient documentation

## 2016-02-21 DIAGNOSIS — H90A21 Sensorineural hearing loss, unilateral, right ear, with restricted hearing on the contralateral side: Secondary | ICD-10-CM | POA: Diagnosis not present

## 2016-02-21 DIAGNOSIS — IMO0001 Reserved for inherently not codable concepts without codable children: Secondary | ICD-10-CM | POA: Insufficient documentation

## 2016-02-21 DIAGNOSIS — H918X2 Other specified hearing loss, left ear: Secondary | ICD-10-CM | POA: Diagnosis not present

## 2016-02-21 DIAGNOSIS — H9113 Presbycusis, bilateral: Secondary | ICD-10-CM

## 2016-02-21 DIAGNOSIS — H61393 Other acquired stenosis of external ear canal, bilateral: Secondary | ICD-10-CM | POA: Diagnosis not present

## 2016-02-21 DIAGNOSIS — H918X3 Other specified hearing loss, bilateral: Secondary | ICD-10-CM | POA: Insufficient documentation

## 2016-02-21 HISTORY — DX: Presbycusis, bilateral: H91.13

## 2016-02-27 ENCOUNTER — Other Ambulatory Visit: Payer: Self-pay | Admitting: Dermatology

## 2016-02-27 DIAGNOSIS — L509 Urticaria, unspecified: Secondary | ICD-10-CM | POA: Diagnosis not present

## 2016-02-27 DIAGNOSIS — D485 Neoplasm of uncertain behavior of skin: Secondary | ICD-10-CM | POA: Diagnosis not present

## 2016-02-27 DIAGNOSIS — L309 Dermatitis, unspecified: Secondary | ICD-10-CM | POA: Diagnosis not present

## 2016-02-27 DIAGNOSIS — L218 Other seborrheic dermatitis: Secondary | ICD-10-CM | POA: Diagnosis not present

## 2016-02-27 DIAGNOSIS — L270 Generalized skin eruption due to drugs and medicaments taken internally: Secondary | ICD-10-CM | POA: Diagnosis not present

## 2016-03-06 DIAGNOSIS — L309 Dermatitis, unspecified: Secondary | ICD-10-CM | POA: Diagnosis not present

## 2016-03-28 DIAGNOSIS — I10 Essential (primary) hypertension: Secondary | ICD-10-CM | POA: Diagnosis not present

## 2016-03-28 DIAGNOSIS — R739 Hyperglycemia, unspecified: Secondary | ICD-10-CM | POA: Diagnosis not present

## 2016-03-28 DIAGNOSIS — M810 Age-related osteoporosis without current pathological fracture: Secondary | ICD-10-CM | POA: Diagnosis not present

## 2016-03-28 DIAGNOSIS — E784 Other hyperlipidemia: Secondary | ICD-10-CM | POA: Diagnosis not present

## 2016-04-04 DIAGNOSIS — M810 Age-related osteoporosis without current pathological fracture: Secondary | ICD-10-CM | POA: Diagnosis not present

## 2016-04-04 DIAGNOSIS — E784 Other hyperlipidemia: Secondary | ICD-10-CM | POA: Diagnosis not present

## 2016-04-04 DIAGNOSIS — K5909 Other constipation: Secondary | ICD-10-CM | POA: Diagnosis not present

## 2016-04-04 DIAGNOSIS — Z23 Encounter for immunization: Secondary | ICD-10-CM | POA: Diagnosis not present

## 2016-04-04 DIAGNOSIS — I6529 Occlusion and stenosis of unspecified carotid artery: Secondary | ICD-10-CM | POA: Diagnosis not present

## 2016-04-04 DIAGNOSIS — I2789 Other specified pulmonary heart diseases: Secondary | ICD-10-CM | POA: Diagnosis not present

## 2016-04-04 DIAGNOSIS — Z0000 Encounter for general adult medical examination without abnormal findings: Secondary | ICD-10-CM | POA: Diagnosis not present

## 2016-04-04 DIAGNOSIS — Z6824 Body mass index (BMI) 24.0-24.9, adult: Secondary | ICD-10-CM | POA: Diagnosis not present

## 2016-04-04 DIAGNOSIS — I10 Essential (primary) hypertension: Secondary | ICD-10-CM | POA: Diagnosis not present

## 2016-04-04 DIAGNOSIS — Z1389 Encounter for screening for other disorder: Secondary | ICD-10-CM | POA: Diagnosis not present

## 2016-04-04 DIAGNOSIS — R600 Localized edema: Secondary | ICD-10-CM | POA: Diagnosis not present

## 2016-04-04 DIAGNOSIS — R7309 Other abnormal glucose: Secondary | ICD-10-CM | POA: Diagnosis not present

## 2016-04-19 ENCOUNTER — Other Ambulatory Visit: Payer: Self-pay | Admitting: Internal Medicine

## 2016-04-19 DIAGNOSIS — Z1231 Encounter for screening mammogram for malignant neoplasm of breast: Secondary | ICD-10-CM

## 2016-05-13 DIAGNOSIS — Z8679 Personal history of other diseases of the circulatory system: Secondary | ICD-10-CM | POA: Diagnosis not present

## 2016-05-13 DIAGNOSIS — H35033 Hypertensive retinopathy, bilateral: Secondary | ICD-10-CM | POA: Diagnosis not present

## 2016-05-13 DIAGNOSIS — H40023 Open angle with borderline findings, high risk, bilateral: Secondary | ICD-10-CM | POA: Diagnosis not present

## 2016-05-13 DIAGNOSIS — H40052 Ocular hypertension, left eye: Secondary | ICD-10-CM | POA: Diagnosis not present

## 2016-05-29 DIAGNOSIS — J309 Allergic rhinitis, unspecified: Secondary | ICD-10-CM | POA: Diagnosis not present

## 2016-05-29 DIAGNOSIS — I10 Essential (primary) hypertension: Secondary | ICD-10-CM | POA: Diagnosis not present

## 2016-05-29 DIAGNOSIS — R634 Abnormal weight loss: Secondary | ICD-10-CM | POA: Diagnosis not present

## 2016-05-29 DIAGNOSIS — R5383 Other fatigue: Secondary | ICD-10-CM | POA: Diagnosis not present

## 2016-05-29 DIAGNOSIS — R05 Cough: Secondary | ICD-10-CM | POA: Diagnosis not present

## 2016-05-29 DIAGNOSIS — K219 Gastro-esophageal reflux disease without esophagitis: Secondary | ICD-10-CM | POA: Diagnosis not present

## 2016-05-29 DIAGNOSIS — E119 Type 2 diabetes mellitus without complications: Secondary | ICD-10-CM | POA: Diagnosis not present

## 2016-05-31 ENCOUNTER — Ambulatory Visit
Admission: RE | Admit: 2016-05-31 | Discharge: 2016-05-31 | Disposition: A | Discharge status: Home / Self Care | Payer: Medicare Other | Source: Ambulatory Visit | Attending: Internal Medicine | Admitting: Internal Medicine

## 2016-05-31 DIAGNOSIS — Z1231 Encounter for screening mammogram for malignant neoplasm of breast: Secondary | ICD-10-CM | POA: Diagnosis not present

## 2016-07-18 DIAGNOSIS — H04123 Dry eye syndrome of bilateral lacrimal glands: Secondary | ICD-10-CM | POA: Diagnosis not present

## 2016-07-18 DIAGNOSIS — H40023 Open angle with borderline findings, high risk, bilateral: Secondary | ICD-10-CM | POA: Diagnosis not present

## 2016-07-18 DIAGNOSIS — G514 Facial myokymia: Secondary | ICD-10-CM | POA: Diagnosis not present

## 2016-07-18 DIAGNOSIS — H40052 Ocular hypertension, left eye: Secondary | ICD-10-CM | POA: Diagnosis not present

## 2016-07-30 DIAGNOSIS — R634 Abnormal weight loss: Secondary | ICD-10-CM | POA: Diagnosis not present

## 2016-07-30 DIAGNOSIS — Z6824 Body mass index (BMI) 24.0-24.9, adult: Secondary | ICD-10-CM | POA: Diagnosis not present

## 2016-07-30 DIAGNOSIS — M25562 Pain in left knee: Secondary | ICD-10-CM | POA: Diagnosis not present

## 2016-07-30 DIAGNOSIS — I2789 Other specified pulmonary heart diseases: Secondary | ICD-10-CM | POA: Diagnosis not present

## 2016-07-30 DIAGNOSIS — I959 Hypotension, unspecified: Secondary | ICD-10-CM | POA: Diagnosis not present

## 2016-07-30 DIAGNOSIS — R5383 Other fatigue: Secondary | ICD-10-CM | POA: Diagnosis not present

## 2016-07-30 DIAGNOSIS — I499 Cardiac arrhythmia, unspecified: Secondary | ICD-10-CM | POA: Diagnosis not present

## 2016-07-30 DIAGNOSIS — M62838 Other muscle spasm: Secondary | ICD-10-CM | POA: Diagnosis not present

## 2016-07-30 DIAGNOSIS — Z23 Encounter for immunization: Secondary | ICD-10-CM | POA: Diagnosis not present

## 2016-07-30 DIAGNOSIS — R42 Dizziness and giddiness: Secondary | ICD-10-CM | POA: Diagnosis not present

## 2016-07-30 DIAGNOSIS — K219 Gastro-esophageal reflux disease without esophagitis: Secondary | ICD-10-CM | POA: Diagnosis not present

## 2016-07-30 DIAGNOSIS — E119 Type 2 diabetes mellitus without complications: Secondary | ICD-10-CM | POA: Diagnosis not present

## 2016-08-08 DIAGNOSIS — H04123 Dry eye syndrome of bilateral lacrimal glands: Secondary | ICD-10-CM | POA: Diagnosis not present

## 2016-08-08 DIAGNOSIS — N762 Acute vulvitis: Secondary | ICD-10-CM | POA: Diagnosis not present

## 2016-08-08 DIAGNOSIS — L292 Pruritus vulvae: Secondary | ICD-10-CM | POA: Diagnosis not present

## 2016-08-08 DIAGNOSIS — H40023 Open angle with borderline findings, high risk, bilateral: Secondary | ICD-10-CM | POA: Diagnosis not present

## 2016-08-08 DIAGNOSIS — H02403 Unspecified ptosis of bilateral eyelids: Secondary | ICD-10-CM | POA: Diagnosis not present

## 2016-08-08 DIAGNOSIS — H40022 Open angle with borderline findings, high risk, left eye: Secondary | ICD-10-CM | POA: Diagnosis not present

## 2016-08-08 DIAGNOSIS — H40052 Ocular hypertension, left eye: Secondary | ICD-10-CM | POA: Diagnosis not present

## 2016-08-12 DIAGNOSIS — I10 Essential (primary) hypertension: Secondary | ICD-10-CM | POA: Diagnosis not present

## 2016-08-12 DIAGNOSIS — Z6824 Body mass index (BMI) 24.0-24.9, adult: Secondary | ICD-10-CM | POA: Diagnosis not present

## 2016-08-12 DIAGNOSIS — L508 Other urticaria: Secondary | ICD-10-CM | POA: Diagnosis not present

## 2016-08-14 DIAGNOSIS — L309 Dermatitis, unspecified: Secondary | ICD-10-CM | POA: Diagnosis not present

## 2016-08-14 DIAGNOSIS — L281 Prurigo nodularis: Secondary | ICD-10-CM | POA: Diagnosis not present

## 2016-08-28 DIAGNOSIS — H02403 Unspecified ptosis of bilateral eyelids: Secondary | ICD-10-CM | POA: Diagnosis not present

## 2016-08-28 DIAGNOSIS — H04123 Dry eye syndrome of bilateral lacrimal glands: Secondary | ICD-10-CM | POA: Diagnosis not present

## 2016-08-28 DIAGNOSIS — G514 Facial myokymia: Secondary | ICD-10-CM | POA: Diagnosis not present

## 2016-08-28 DIAGNOSIS — H40023 Open angle with borderline findings, high risk, bilateral: Secondary | ICD-10-CM | POA: Diagnosis not present

## 2016-10-09 DIAGNOSIS — L308 Other specified dermatitis: Secondary | ICD-10-CM | POA: Diagnosis not present

## 2016-10-09 DIAGNOSIS — L810 Postinflammatory hyperpigmentation: Secondary | ICD-10-CM | POA: Diagnosis not present

## 2016-11-27 DIAGNOSIS — R102 Pelvic and perineal pain: Secondary | ICD-10-CM | POA: Diagnosis not present

## 2016-11-27 DIAGNOSIS — Z6824 Body mass index (BMI) 24.0-24.9, adult: Secondary | ICD-10-CM | POA: Diagnosis not present

## 2016-12-03 DIAGNOSIS — Z6824 Body mass index (BMI) 24.0-24.9, adult: Secondary | ICD-10-CM | POA: Diagnosis not present

## 2016-12-03 DIAGNOSIS — E784 Other hyperlipidemia: Secondary | ICD-10-CM | POA: Diagnosis not present

## 2016-12-03 DIAGNOSIS — R102 Pelvic and perineal pain: Secondary | ICD-10-CM | POA: Diagnosis not present

## 2016-12-03 DIAGNOSIS — I2789 Other specified pulmonary heart diseases: Secondary | ICD-10-CM | POA: Diagnosis not present

## 2016-12-03 DIAGNOSIS — L508 Other urticaria: Secondary | ICD-10-CM | POA: Diagnosis not present

## 2016-12-03 DIAGNOSIS — E119 Type 2 diabetes mellitus without complications: Secondary | ICD-10-CM | POA: Diagnosis not present

## 2016-12-03 DIAGNOSIS — I10 Essential (primary) hypertension: Secondary | ICD-10-CM | POA: Diagnosis not present

## 2016-12-10 DIAGNOSIS — L814 Other melanin hyperpigmentation: Secondary | ICD-10-CM | POA: Diagnosis not present

## 2016-12-13 DIAGNOSIS — L819 Disorder of pigmentation, unspecified: Secondary | ICD-10-CM | POA: Diagnosis not present

## 2016-12-18 DIAGNOSIS — R109 Unspecified abdominal pain: Secondary | ICD-10-CM | POA: Diagnosis not present

## 2016-12-23 DIAGNOSIS — R102 Pelvic and perineal pain: Secondary | ICD-10-CM | POA: Diagnosis not present

## 2017-01-01 ENCOUNTER — Other Ambulatory Visit: Payer: Self-pay | Admitting: Gastroenterology

## 2017-01-01 DIAGNOSIS — K5900 Constipation, unspecified: Secondary | ICD-10-CM | POA: Diagnosis not present

## 2017-01-01 DIAGNOSIS — Z86010 Personal history of colonic polyps: Secondary | ICD-10-CM | POA: Diagnosis not present

## 2017-01-01 DIAGNOSIS — R1030 Lower abdominal pain, unspecified: Secondary | ICD-10-CM

## 2017-01-02 ENCOUNTER — Ambulatory Visit
Admission: RE | Admit: 2017-01-02 | Discharge: 2017-01-02 | Disposition: A | Discharge status: Home / Self Care | Payer: Medicare Other | Source: Ambulatory Visit | Attending: Gastroenterology | Admitting: Gastroenterology

## 2017-01-02 DIAGNOSIS — R1030 Lower abdominal pain, unspecified: Secondary | ICD-10-CM

## 2017-01-02 MED ORDER — IOPAMIDOL (ISOVUE-300) INJECTION 61%
100.0000 mL | Freq: Once | INTRAVENOUS | Status: AC | PRN
  Administered 2017-01-02: 100 mL via INTRAVENOUS

## 2017-01-06 ENCOUNTER — Other Ambulatory Visit: Payer: Medicare Other

## 2017-01-07 ENCOUNTER — Encounter: Payer: Self-pay | Admitting: Dietician

## 2017-01-07 ENCOUNTER — Encounter: Payer: Medicare Other | Attending: Internal Medicine | Admitting: Dietician

## 2017-01-07 DIAGNOSIS — Z713 Dietary counseling and surveillance: Secondary | ICD-10-CM | POA: Insufficient documentation

## 2017-01-07 DIAGNOSIS — E119 Type 2 diabetes mellitus without complications: Secondary | ICD-10-CM | POA: Insufficient documentation

## 2017-01-07 NOTE — Progress Notes (Signed)
Patient was seen on 01/07/17 for the first of a series of three diabetes self-management courses at the [facility].  Patient Education Plan per assessed needs and concerns is to attend four course education program for Diabetes Self Management Education.  The following learning objectives were met by the patient during this class:  Describe diabetes  State some common risk factors for diabetes  Defines the role of glucose and insulin  Identifies type of diabetes and pathophysiology  Describe the relationship between diabetes and cardiovascular risk  State the members of the Healthcare Team  States the rationale for glucose monitoring  State when to test glucose  State their individual Target Range  State the importance of logging glucose readings  Describe how to interpret glucose readings  Identifies A1C target  Explain the correlation between A1c and eAG values  State symptoms and treatment of high blood glucose  State symptoms and treatment of low blood glucose  Explain proper technique for glucose testing  Identifies proper sharps disposal  Handouts given during class include:  Living Well with Diabetes book  Carb Counting and Meal Planning book  Meal Plan Card  Carbohydrate guide  Meal planning worksheet  Low Sodium Flavoring Tips  The diabetes portion plate  A1c to eAG Conversion Chart  Diabetes Medications  Diabetes Recommended Care Schedule  Support Group  Diabetes Success Plan  Core Class Satisfaction Survey   . The patient's Newest Vital Sign Health Literacy Assessment score was 0. . The patient scored 83% on the Diabetes Knowledge pre-test. . Educational strategies utilized during class included repetition, teach-back, eliminating medical jargon, and being open to questions.   Follow-Up Plan:  Attend core 2   

## 2017-01-08 DIAGNOSIS — L299 Pruritus, unspecified: Secondary | ICD-10-CM | POA: Diagnosis not present

## 2017-01-08 DIAGNOSIS — L270 Generalized skin eruption due to drugs and medicaments taken internally: Secondary | ICD-10-CM | POA: Diagnosis not present

## 2017-01-08 DIAGNOSIS — R21 Rash and other nonspecific skin eruption: Secondary | ICD-10-CM | POA: Diagnosis not present

## 2017-01-10 DIAGNOSIS — E1165 Type 2 diabetes mellitus with hyperglycemia: Secondary | ICD-10-CM | POA: Diagnosis not present

## 2017-01-10 DIAGNOSIS — I10 Essential (primary) hypertension: Secondary | ICD-10-CM | POA: Diagnosis not present

## 2017-01-10 DIAGNOSIS — E785 Hyperlipidemia, unspecified: Secondary | ICD-10-CM | POA: Diagnosis not present

## 2017-01-10 DIAGNOSIS — M792 Neuralgia and neuritis, unspecified: Secondary | ICD-10-CM | POA: Diagnosis not present

## 2017-01-14 ENCOUNTER — Encounter: Payer: Self-pay | Admitting: Podiatry

## 2017-01-14 ENCOUNTER — Ambulatory Visit (INDEPENDENT_AMBULATORY_CARE_PROVIDER_SITE_OTHER): Payer: Medicare Other | Admitting: Podiatry

## 2017-01-14 ENCOUNTER — Encounter: Payer: Medicare Other | Attending: Internal Medicine | Admitting: Dietician

## 2017-01-14 DIAGNOSIS — Z713 Dietary counseling and surveillance: Secondary | ICD-10-CM | POA: Insufficient documentation

## 2017-01-14 DIAGNOSIS — B351 Tinea unguium: Secondary | ICD-10-CM | POA: Diagnosis not present

## 2017-01-14 DIAGNOSIS — E1142 Type 2 diabetes mellitus with diabetic polyneuropathy: Secondary | ICD-10-CM

## 2017-01-14 DIAGNOSIS — M2041 Other hammer toe(s) (acquired), right foot: Secondary | ICD-10-CM

## 2017-01-14 DIAGNOSIS — M79609 Pain in unspecified limb: Secondary | ICD-10-CM

## 2017-01-14 DIAGNOSIS — E119 Type 2 diabetes mellitus without complications: Secondary | ICD-10-CM | POA: Insufficient documentation

## 2017-01-14 DIAGNOSIS — M2042 Other hammer toe(s) (acquired), left foot: Secondary | ICD-10-CM | POA: Diagnosis not present

## 2017-01-14 NOTE — Progress Notes (Signed)
   Subjective:    Patient ID: Sara Brown, female    DOB: 04/29/1941, 76 y.o.   MRN: [id number]  HPI  Chief Complaint  Patient presents with  . Toe Pain    Tingling in both big toes, sometimes in other toes. onset 1-2 yrs  . Diabetes    foot care       Review of Systems  Constitutional: Positive for unexpected weight change.  HENT: Positive for congestion, ear pain, hearing loss and tinnitus.   Cardiovascular: Positive for leg swelling.  Endocrine: Positive for polyuria.  Musculoskeletal: Positive for arthralgias and myalgias.  Skin: Positive for color change and rash.  Neurological: Positive for light-headedness.  Hematological: Positive for adenopathy.  All other systems reviewed and are negative.      Objective:   Physical Exam: Vital signs are stable she is alert and oriented 3 pulses are strongly palpable bilateral. Neurologic sensorium is slightly diminished per Semmes-Weinstein monofilament in a random distribution distally. Deep tendon reflexes are not elicitable muscle strength is normal bilateral. Orthopedic evaluation demonstrates mild pes planus with hammertoe deformity flexible in nature left greater than right. 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  Plan: We started paperwork and measured her today for diabetic shoes. We did discuss the possible need for medication but she does not will start that yet. Encouraged her to continue exercise and to keep her blood sugars as well as possible.

## 2017-01-14 NOTE — Progress Notes (Signed)
Patient was seen on 01/14/17 for the third of a series of three diabetes self-management courses at the [facility].   Catalina Gravel the amount of activity recommended for healthy living . Describe activities suitable for individual needs . Identify ways to regularly incorporate activity into daily life . Identify barriers to activity and ways to over come these barriers  Identify diabetes medications being personally used and their primary action for lowering glucose and possible side effects . Describe role of stress on blood glucose and develop strategies to address psychosocial issues . Identify diabetes complications and ways to prevent them  Explain how to manage diabetes during illness . Evaluate success in meeting personal goal . Establish 2-3 goals that they will plan to diligently work on until they return for the  93-month follow-up visit  Goals:   I will be careful of portion sizes at meals  I will be active 30 minutes or more 3 times a week  I will eat less unhealthy fats by eating less foods cooked with oils  I will test my glucose at least 1 times a day, 2 days a week  To help manage stress I will  walk at least 3 times a week  Your patient has identified these potential barriers to change:  Motivation  Your patient has identified their diabetes self-care support plan as  Family Support [employer] data [employer]  . The patient scored 83% on the Diabetes Knowledge post-test.   Plan:  Attend Support Group as desired

## 2017-01-14 NOTE — Progress Notes (Signed)
Patient was seen on 01/14/17 for the second of a series of three diabetes self-management courses at the [facility]. The following learning objectives were met by the patient during this class:   Describe the role of different macronutrients on glucose  Explain how carbohydrates affect blood glucose  State what foods contain the most carbohydrates  Demonstrate carbohydrate counting  Demonstrate how to read Nutrition Facts food label  Describe effects of various fats on heart health  Describe the importance of good nutrition for health and healthy eating strategies  Describe techniques for managing your shopping, cooking and meal planning  List strategies to follow meal plan when dining out  Describe the effects of alcohol on glucose and how to use it safely  Goals:  Follow Diabetes Meal Plan as instructed  Aim to spread carbs evenly throughout the day  Aim for 3 meals per day and snacks as needed Include lean protein foods to meals/snacks  Monitor glucose levels as instructed by your doctor   Follow-Up Plan:  Attend Core 3  Work towards following your personal food plan.   

## 2017-01-17 DIAGNOSIS — R221 Localized swelling, mass and lump, neck: Secondary | ICD-10-CM | POA: Diagnosis not present

## 2017-01-17 DIAGNOSIS — H903 Sensorineural hearing loss, bilateral: Secondary | ICD-10-CM | POA: Diagnosis not present

## 2017-01-17 HISTORY — DX: Localized swelling, mass and lump, neck: R22.1

## 2017-01-21 ENCOUNTER — Ambulatory Visit: Payer: Medicare Other

## 2017-01-21 ENCOUNTER — Other Ambulatory Visit: Payer: Self-pay | Admitting: Otolaryngology

## 2017-01-21 DIAGNOSIS — R221 Localized swelling, mass and lump, neck: Secondary | ICD-10-CM

## 2017-01-21 DIAGNOSIS — K5900 Constipation, unspecified: Secondary | ICD-10-CM | POA: Diagnosis not present

## 2017-01-21 DIAGNOSIS — K589 Irritable bowel syndrome without diarrhea: Secondary | ICD-10-CM | POA: Diagnosis not present

## 2017-01-22 DIAGNOSIS — Z7189 Other specified counseling: Secondary | ICD-10-CM | POA: Diagnosis not present

## 2017-01-22 DIAGNOSIS — L270 Generalized skin eruption due to drugs and medicaments taken internally: Secondary | ICD-10-CM | POA: Diagnosis not present

## 2017-01-28 ENCOUNTER — Ambulatory Visit
Admission: RE | Admit: 2017-01-28 | Discharge: 2017-01-28 | Disposition: A | Discharge status: Home / Self Care | Payer: Medicare Other | Source: Ambulatory Visit | Attending: Otolaryngology | Admitting: Otolaryngology

## 2017-01-28 ENCOUNTER — Other Ambulatory Visit: Payer: Medicare Other

## 2017-01-28 DIAGNOSIS — R221 Localized swelling, mass and lump, neck: Secondary | ICD-10-CM

## 2017-01-28 MED ORDER — IOPAMIDOL (ISOVUE-300) INJECTION 61%
75.0000 mL | Freq: Once | INTRAVENOUS | Status: AC | PRN
  Administered 2017-01-28: 75 mL via INTRAVENOUS

## 2017-02-04 DIAGNOSIS — Z6823 Body mass index (BMI) 23.0-23.9, adult: Secondary | ICD-10-CM | POA: Diagnosis not present

## 2017-02-04 DIAGNOSIS — J358 Other chronic diseases of tonsils and adenoids: Secondary | ICD-10-CM | POA: Diagnosis not present

## 2017-02-04 DIAGNOSIS — J029 Acute pharyngitis, unspecified: Secondary | ICD-10-CM | POA: Diagnosis not present

## 2017-02-13 DIAGNOSIS — L819 Disorder of pigmentation, unspecified: Secondary | ICD-10-CM | POA: Diagnosis not present

## 2017-02-13 HISTORY — DX: Disorder of pigmentation, unspecified: L81.9

## 2017-02-25 ENCOUNTER — Ambulatory Visit (INDEPENDENT_AMBULATORY_CARE_PROVIDER_SITE_OTHER): Payer: Medicare Other | Admitting: Podiatry

## 2017-02-25 DIAGNOSIS — M2042 Other hammer toe(s) (acquired), left foot: Secondary | ICD-10-CM | POA: Diagnosis not present

## 2017-02-25 DIAGNOSIS — E1142 Type 2 diabetes mellitus with diabetic polyneuropathy: Secondary | ICD-10-CM | POA: Diagnosis not present

## 2017-02-25 DIAGNOSIS — M2041 Other hammer toe(s) (acquired), right foot: Secondary | ICD-10-CM | POA: Diagnosis not present

## 2017-02-25 NOTE — Progress Notes (Signed)

## 2017-02-26 DIAGNOSIS — R5383 Other fatigue: Secondary | ICD-10-CM | POA: Diagnosis not present

## 2017-02-26 DIAGNOSIS — I10 Essential (primary) hypertension: Secondary | ICD-10-CM | POA: Diagnosis not present

## 2017-02-26 DIAGNOSIS — R001 Bradycardia, unspecified: Secondary | ICD-10-CM | POA: Diagnosis not present

## 2017-02-26 DIAGNOSIS — Z6823 Body mass index (BMI) 23.0-23.9, adult: Secondary | ICD-10-CM | POA: Diagnosis not present

## 2017-03-04 ENCOUNTER — Ambulatory Visit: Payer: Medicare Other | Admitting: Orthotics

## 2017-03-24 DIAGNOSIS — R7309 Other abnormal glucose: Secondary | ICD-10-CM | POA: Diagnosis not present

## 2017-03-24 DIAGNOSIS — I700 Atherosclerosis of aorta: Secondary | ICD-10-CM | POA: Diagnosis not present

## 2017-03-24 DIAGNOSIS — I10 Essential (primary) hypertension: Secondary | ICD-10-CM | POA: Diagnosis not present

## 2017-03-24 DIAGNOSIS — Z23 Encounter for immunization: Secondary | ICD-10-CM | POA: Diagnosis not present

## 2017-03-24 DIAGNOSIS — Z6823 Body mass index (BMI) 23.0-23.9, adult: Secondary | ICD-10-CM | POA: Diagnosis not present

## 2017-03-24 DIAGNOSIS — R008 Other abnormalities of heart beat: Secondary | ICD-10-CM | POA: Diagnosis not present

## 2017-03-24 DIAGNOSIS — E119 Type 2 diabetes mellitus without complications: Secondary | ICD-10-CM | POA: Diagnosis not present

## 2017-04-01 DIAGNOSIS — E7849 Other hyperlipidemia: Secondary | ICD-10-CM | POA: Diagnosis not present

## 2017-04-01 DIAGNOSIS — E119 Type 2 diabetes mellitus without complications: Secondary | ICD-10-CM | POA: Diagnosis not present

## 2017-04-01 DIAGNOSIS — E559 Vitamin D deficiency, unspecified: Secondary | ICD-10-CM | POA: Diagnosis not present

## 2017-04-01 DIAGNOSIS — I10 Essential (primary) hypertension: Secondary | ICD-10-CM | POA: Diagnosis not present

## 2017-04-07 DIAGNOSIS — Z1212 Encounter for screening for malignant neoplasm of rectum: Secondary | ICD-10-CM | POA: Diagnosis not present

## 2017-04-08 DIAGNOSIS — R634 Abnormal weight loss: Secondary | ICD-10-CM | POA: Diagnosis not present

## 2017-04-08 DIAGNOSIS — R05 Cough: Secondary | ICD-10-CM | POA: Diagnosis not present

## 2017-04-08 DIAGNOSIS — I2721 Secondary pulmonary arterial hypertension: Secondary | ICD-10-CM | POA: Diagnosis not present

## 2017-04-08 DIAGNOSIS — Z0000 Encounter for general adult medical examination without abnormal findings: Secondary | ICD-10-CM | POA: Diagnosis not present

## 2017-04-08 DIAGNOSIS — R001 Bradycardia, unspecified: Secondary | ICD-10-CM | POA: Diagnosis not present

## 2017-04-08 DIAGNOSIS — L508 Other urticaria: Secondary | ICD-10-CM | POA: Diagnosis not present

## 2017-04-08 DIAGNOSIS — Z6822 Body mass index (BMI) 22.0-22.9, adult: Secondary | ICD-10-CM | POA: Diagnosis not present

## 2017-04-08 DIAGNOSIS — I6529 Occlusion and stenosis of unspecified carotid artery: Secondary | ICD-10-CM | POA: Diagnosis not present

## 2017-04-08 DIAGNOSIS — Z1389 Encounter for screening for other disorder: Secondary | ICD-10-CM | POA: Diagnosis not present

## 2017-04-08 DIAGNOSIS — R102 Pelvic and perineal pain: Secondary | ICD-10-CM | POA: Diagnosis not present

## 2017-04-08 DIAGNOSIS — E119 Type 2 diabetes mellitus without complications: Secondary | ICD-10-CM | POA: Diagnosis not present

## 2017-04-08 DIAGNOSIS — I700 Atherosclerosis of aorta: Secondary | ICD-10-CM | POA: Diagnosis not present

## 2017-04-23 DIAGNOSIS — N958 Other specified menopausal and perimenopausal disorders: Secondary | ICD-10-CM | POA: Diagnosis not present

## 2017-04-23 DIAGNOSIS — R1030 Lower abdominal pain, unspecified: Secondary | ICD-10-CM | POA: Diagnosis not present

## 2017-05-13 DIAGNOSIS — R634 Abnormal weight loss: Secondary | ICD-10-CM | POA: Diagnosis not present

## 2017-05-13 DIAGNOSIS — E1165 Type 2 diabetes mellitus with hyperglycemia: Secondary | ICD-10-CM | POA: Diagnosis not present

## 2017-05-13 DIAGNOSIS — M792 Neuralgia and neuritis, unspecified: Secondary | ICD-10-CM | POA: Diagnosis not present

## 2017-05-13 DIAGNOSIS — E7800 Pure hypercholesterolemia, unspecified: Secondary | ICD-10-CM | POA: Diagnosis not present

## 2017-05-13 DIAGNOSIS — I10 Essential (primary) hypertension: Secondary | ICD-10-CM | POA: Diagnosis not present

## 2017-05-14 DIAGNOSIS — H40023 Open angle with borderline findings, high risk, bilateral: Secondary | ICD-10-CM | POA: Diagnosis not present

## 2017-05-14 DIAGNOSIS — I708 Atherosclerosis of other arteries: Secondary | ICD-10-CM | POA: Diagnosis not present

## 2017-05-14 DIAGNOSIS — E119 Type 2 diabetes mellitus without complications: Secondary | ICD-10-CM | POA: Diagnosis not present

## 2017-05-14 DIAGNOSIS — H35033 Hypertensive retinopathy, bilateral: Secondary | ICD-10-CM | POA: Diagnosis not present

## 2017-05-21 DIAGNOSIS — I10 Essential (primary) hypertension: Secondary | ICD-10-CM | POA: Diagnosis not present

## 2017-05-25 ENCOUNTER — Other Ambulatory Visit: Payer: Self-pay

## 2017-05-25 ENCOUNTER — Emergency Department (HOSPITAL_BASED_OUTPATIENT_CLINIC_OR_DEPARTMENT_OTHER)
Admission: EM | Admit: 2017-05-25 | Discharge: 2017-05-25 | Disposition: A | Discharge status: Home / Self Care | Payer: Medicare Other | Attending: Physician Assistant | Admitting: Physician Assistant

## 2017-05-25 ENCOUNTER — Encounter (HOSPITAL_BASED_OUTPATIENT_CLINIC_OR_DEPARTMENT_OTHER): Payer: Self-pay | Admitting: Emergency Medicine

## 2017-05-25 DIAGNOSIS — J45909 Unspecified asthma, uncomplicated: Secondary | ICD-10-CM | POA: Diagnosis not present

## 2017-05-25 DIAGNOSIS — E119 Type 2 diabetes mellitus without complications: Secondary | ICD-10-CM | POA: Diagnosis not present

## 2017-05-25 DIAGNOSIS — Z79899 Other long term (current) drug therapy: Secondary | ICD-10-CM | POA: Diagnosis not present

## 2017-05-25 DIAGNOSIS — I10 Essential (primary) hypertension: Secondary | ICD-10-CM | POA: Diagnosis not present

## 2017-05-25 LAB — CBG MONITORING, ED: Glucose-Capillary: 124 mg/dL — ABNORMAL HIGH (ref 65–99)

## 2017-05-25 NOTE — ED Notes (Signed)
ED Provider at bedside. 

## 2017-05-25 NOTE — Discharge Instructions (Signed)
You were seen today because you took your blood pressure at chruch and it was high.  Your blood pressure is now normal at 130/78.  We would not treat this blood pressure at this time.  Please keep a close eye on it and return to follow-up with your primary care physician as needed.\ You Had questions about how to take your blood pressure medication.  We think that you can take her blood pressure at midday, if it is high (above 546 systolic (the top number)), follow instructions on the pill bottle and take the 10 mg of hydralazine.  Return if you have any chest pain, neurologic symptoms, or other concerns.

## 2017-05-25 NOTE — ED Provider Notes (Signed)
[facility] EMERGENCY DEPARTMENT Provider Note   CSN: [id number] Arrival date & time: 05/25/17  1515     History   Chief Complaint Chief Complaint  Patient presents with  . Hypertension    HPI Sara Brown is a 76 y.o. female.  HPI   Patient is a 76 year old female presenting with hypertension.  Patient was at church and felt mildly ill.  She had someone take her blood pressure and she noted to be 170/80.  Patient was concerned and came here to the emergency department.  Patient is very concerned about her blood pressure in general.  She has been on multiple medications in the past.  Her physician removed from his medications and had her only take Spironolactone.  She was concerned about her blood pressure and went to her PCP in on Friday.  Her PCP started her on hydralazine twice daily if the blood pressure was greater than 135.  Since she was given a prescription patient's been taking her blood pressure every hour or 2 in order to see with her blood pressure was the right level to take the medication.  Patient reports she always has symptoms when her blood pressure gets high.  She reports that she gets left-sided numb numbness to her tongue.  She reports that this is chronic.  She has no symptoms currently.  She feels baseline.  Past Medical History:  Diagnosis Date  . Allergic rhinitis   . Asthma   . Carotid stenosis    ICA(L)  . Constipation   . Diabetes mellitus without complication ([place])   . GERD (gastroesophageal reflux disease)   . Hyperlipidemia   . Hypertension   . Neuropathy    face (R)  . OA (osteoarthritis)   . Osteoporosis   . Vitamin D deficiency     Patient Active Problem List   Diagnosis Date Noted  . GERD (gastroesophageal reflux disease)   . Shortness of breath 07/30/2007  . COUGH 07/30/2007    Past Surgical History:  Procedure Laterality Date  . CESAREAN SECTION    . CESAREAN SECTION      OB History    No data available         Home Medications    Prior to Admission medications   Medication Sig Start Date End Date Taking? Authorizing Provider  acetaminophen (TYLENOL) 500 MG tablet Take 1,000 mg by mouth every 6 (six) hours as needed for mild pain.    [provider]  amLODipine (NORVASC) 10 MG tablet Take 5 mg by mouth daily.    [provider]  aspirin 81 MG tablet Take 81 mg by mouth daily.    [provider]  carboxymethylcellulose (REFRESH PLUS) 0.5 % SOLN Place 2 drops into both eyes 4 (four) times daily as needed (dry eyes). As directed     [provider]  clobetasol cream (TEMOVATE) 0.05 % APPLY TO RASH WHILE SKIN IS WET ONCE DAILY. DO NOT APPLY TO FACE OR FOLDS. 11/16/15   [provider]  cycloSPORINE (RESTASIS) 0.05 % ophthalmic emulsion Place 1 drop into both eyes every evening.     [provider]  Emollient (CERAVE EX) Apply 1 application topically 3 (three) times daily as needed (itching).    [provider]  fish oil-omega-3 fatty acids 1000 MG capsule Take 2 g by mouth every evening.     [provider]  fluticasone (FLONASE) 50 MCG/ACT nasal spray Place 2 sprays into both nostrils daily as needed  for allergies.     [provider]  lansoprazole (PREVACID) 15 MG capsule Take 15 mg by mouth daily as needed (acid reflux).     [provider]  losartan (COZAAR) 25 MG tablet Take 25 mg by mouth every evening.     [provider]  magnesium 30 MG tablet Take 30 mg by mouth every evening.     [provider]  Multiple Vitamins-Minerals (MULTIVITAMIN WITH MINERALS) tablet Take 1 tablet by mouth every evening.     [provider]  Pitavastatin Calcium (LIVALO PO) Take 1 tablet by mouth daily.    [provider]  Polyethyl Glycol-Propyl Glycol (SYSTANE OP) Place 2 drops into both eyes 2 (two) times daily as needed (dry eyes).    [provider]  polyethylene glycol (MIRALAX  / GLYCOLAX) packet Take 17 g by mouth daily as needed for mild constipation.    [provider]  pyridOXINE (VITAMIN B-6) 100 MG tablet Take 100 mg by mouth every evening.     [provider]  Soap & Cleansers (AQUANIL SKIN CLEANSER EX) Apply 1 application topically 2 (two) times daily.     [provider]  spironolactone (ALDACTONE) 50 MG tablet Take 50 mg by mouth 2 (two) times daily.    [provider]  triamcinolone cream (KENALOG) 0.1 % Apply 1 application topically 2 (two) times daily as needed (rash/ eczema). To affected area as needed to rash 11/01/14   [provider]  vitamin B-12 (CYANOCOBALAMIN) 1000 MCG tablet Take 1,000 mcg by mouth every evening.     [provider]  vitamin E 100 UNIT capsule Take 100 Units by mouth every evening.     [provider]    Family History Family History  Problem Relation Age of Onset  . Diabetes Mellitus II Mother   . Osteoporosis Mother   . Hypertension Mother   . Diabetes Mellitus II Father   . CVA Sister        4  . CAD Sister        4  . Hypertension Sister        4  . Osteoarthritis Sister        4  . Diabetes Mellitus II Sister        4  . Dementia Sister        decreased  . Alcoholism Brother     Social History Social History   Tobacco Use  . Smoking status: Never Smoker  . Smokeless tobacco: Never Used  Substance Use Topics  . Alcohol use: No    Alcohol/week: 0.0 oz  . Drug use: No     Allergies   Naproxen and Prednisone   Review of Systems Review of Systems  Constitutional: Negative for activity change.  Respiratory: Negative for shortness of breath.   Cardiovascular: Negative for chest pain.  Gastrointestinal: Negative for abdominal pain.  Neurological: Positive for dizziness, facial asymmetry, speech difficulty, light-headedness and headaches.  All other systems reviewed and are negative.    Physical Exam Updated Vital Signs BP (!) 169/85  (BP Location: Left Arm)   Pulse 74   Temp 98.9 F (37.2 C) (Oral)   Resp 20   SpO2 100%   Physical Exam  Constitutional: She is oriented to person, place, and time. She appears well-developed and well-nourished.  Very well put together, 76 year old female appearing younger than stated age.  HENT:  Head: Normocephalic and atraumatic.  Eyes: Right eye exhibits no  discharge.  Cardiovascular: Normal rate, regular rhythm and normal heart sounds.  No murmur heard. Pulmonary/Chest: Effort normal and breath sounds normal. She has no wheezes. She has no rales.  Abdominal: Soft. She exhibits no distension. There is no tenderness.  Neurological: She is oriented to person, place, and time.  Skin: Skin is warm and dry. She is not diaphoretic.  Psychiatric: She has a normal mood and affect.  Nursing note and vitals reviewed.    ED Treatments / Results  Labs (all labs ordered are listed, but only abnormal results are displayed) Labs Reviewed  CBG MONITORING, ED - Abnormal; Notable for the following components:      Result Value   Glucose-Capillary 124 (*)    All other components within normal limits    EKG  EKG Interpretation None       Radiology No results found.  Procedures Procedures (including critical care time)  Medications Ordered in ED Medications - No data to display   Initial Impression / Assessment and Plan / ED Course  I have reviewed the triage vital signs and the nursing notes.  Pertinent labs & imaging results that were available during my care of the patient were reviewed by me and considered in my medical decision making (see chart for details).     Patient is a 76 year old female presenting with hypertension.  Patient was at church and felt mildly ill.  She had someone take her blood pressure and she noted to be 170/80.  Patient was concerned and came here to the emergency department.  Patient is very concerned about her blood pressure in general.  She has  been on multiple medications in the past.  Her physician removed from his medications and had her only take Spironolactone.  She was concerned about her blood pressure and went to her PCP in on Friday.  Her PCP started her on hydralazine twice daily if the blood pressure was greater than 135.  Since she was given a prescription patient's been taking her blood pressure every hour or 2 in order to see with her blood pressure was the right level to take the medication.  Patient reports she always has symptoms when her blood pressure gets high.  She reports that she gets left-sided numb numbness to her tongue.  She reports that this is chronic.  She has no symptoms currently.  She feels baseline.  Patient's blood pressure is 130/78 here.  I will not treat any hypertension.  I told her she probably needs to take her blood pressure once a day around noon and decide whether to add that extra hydralazine pill.  I told her to keep a record of her blood pressure while taking only 3 times a day :morning noon and night and then follow-up with her primary care physician for further eval.   Final Clinical Impressions(s) / ED Diagnoses   Final diagnoses:  None    ED Discharge Orders    None       Macarthur Critchley, MD 05/25/17 2334

## 2017-05-25 NOTE — ED Triage Notes (Addendum)
The lady at church took my BP and it 165/93 and she told me to come here . States whenever her BP is elevated she has numbness to her tongue and it happens often . States her tongue feels funny and her chest feels funny as well" No facial deficit noted . Also states she checks her BP every hour or so at home

## 2017-05-29 DIAGNOSIS — I10 Essential (primary) hypertension: Secondary | ICD-10-CM | POA: Diagnosis not present

## 2017-06-11 ENCOUNTER — Encounter: Payer: Medicare Other | Attending: Endocrinology | Admitting: *Deleted

## 2017-06-11 DIAGNOSIS — Z713 Dietary counseling and surveillance: Secondary | ICD-10-CM | POA: Diagnosis not present

## 2017-06-11 DIAGNOSIS — Z6822 Body mass index (BMI) 22.0-22.9, adult: Secondary | ICD-10-CM | POA: Insufficient documentation

## 2017-06-11 DIAGNOSIS — E119 Type 2 diabetes mellitus without complications: Secondary | ICD-10-CM | POA: Insufficient documentation

## 2017-06-11 NOTE — Patient Instructions (Signed)
Plan:  Aim for 2-3 Carb Choices per meal (30-45 grams)   Aim for 0-2 Carbs per snack if hungry  Include protein in moderation with your meals and snacks Consider reading food labels for Total Carbohydrate of foods Consider  increasing your activity level by walking daily as tolerated Consider checking BG at alternate times per day a few times a week

## 2017-06-11 NOTE — Progress Notes (Signed)
Diabetes Self-Management Education  Visit Type:  (P) Follow-up  Appt. Start Time: 0815 Appt. End Time: 0935  06/11/2017  [name], identified by name and date of birth, is a 77 y.o. female with a diagnosis of Diabetes:  . She is here with her husband who appeared to listen during the visit. She attended the Core Diabetes Class this past year but has questions about food choices and has experienced unexpected weight loss.  Diet history obtained and indicates over restriction of food intake. ASSESSMENT  Height 5' 6.5" (1.689 m), weight 141 lb 4.8 oz (64.1 kg). Body mass index is 22.46 kg/m.   Diabetes Self-Management Education - 06/11/17 0810      Psychosocial Assessment   Patient Belief/Attitude about Diabetes  Motivated to manage diabetes  (Pended)     Patient Concerns  Nutrition/Meal planning  (Pended)       Dietary Intake   Breakfast  oatmeal with berries, walnut butter with olive oil on toast or in cereal, OR 2 egg whites with toast and fruit  (Pended)     Snack (morning)  occasionally crackers or fresh fruit  (Pended)     Lunch  vegetables, lean meat, small serving of starch  (Pended)     Dinner  similar to lunch with vegetables, meat, and small serving of starch  (Pended)     Snack (evening)  crackers with walnut butter OR fresh frruit,   (Pended)     Beverage(s)  hot tea plain, water  (Pended)       Learning Objective:  Patient will have a greater understanding of diabetes self-management. Patient education plan is to attend individual and/or group sessions per assessed needs and concerns.  Plan:   Patient Instructions  Plan:  Aim for 2-3 Carb Choices per meal (30-45 grams)   Aim for 0-2 Carbs per snack if hungry  Include protein in moderation with your meals and snacks Consider reading food labels for Total Carbohydrate of foods Consider  increasing your activity level by walking daily as tolerated Consider checking BG at alternate times per day a few times  a week  Expected Outcomes:     Education material provided: Food label handouts, A1C conversion sheet, Support group flyer and Carbohydrate counting sheet  If problems or questions, patient to contact team via:  Phone  Future DSME appointment: -  PRN

## 2017-06-12 DIAGNOSIS — L680 Hirsutism: Secondary | ICD-10-CM | POA: Diagnosis not present

## 2017-06-12 DIAGNOSIS — N390 Urinary tract infection, site not specified: Secondary | ICD-10-CM | POA: Diagnosis not present

## 2017-06-12 DIAGNOSIS — R300 Dysuria: Secondary | ICD-10-CM | POA: Diagnosis not present

## 2017-06-12 DIAGNOSIS — L574 Cutis laxa senilis: Secondary | ICD-10-CM | POA: Diagnosis not present

## 2017-06-12 DIAGNOSIS — L819 Disorder of pigmentation, unspecified: Secondary | ICD-10-CM | POA: Diagnosis not present

## 2017-06-12 HISTORY — DX: Cutis laxa senilis: L57.4

## 2017-06-12 HISTORY — DX: Hirsutism: L68.0

## 2017-06-13 DIAGNOSIS — R221 Localized swelling, mass and lump, neck: Secondary | ICD-10-CM | POA: Diagnosis not present

## 2017-06-13 DIAGNOSIS — H9113 Presbycusis, bilateral: Secondary | ICD-10-CM | POA: Diagnosis not present

## 2017-07-16 DIAGNOSIS — H40023 Open angle with borderline findings, high risk, bilateral: Secondary | ICD-10-CM | POA: Diagnosis not present

## 2017-07-16 DIAGNOSIS — R1030 Lower abdominal pain, unspecified: Secondary | ICD-10-CM | POA: Diagnosis not present

## 2017-07-16 DIAGNOSIS — K5900 Constipation, unspecified: Secondary | ICD-10-CM | POA: Diagnosis not present

## 2017-07-16 DIAGNOSIS — G514 Facial myokymia: Secondary | ICD-10-CM | POA: Diagnosis not present

## 2017-07-16 DIAGNOSIS — H16223 Keratoconjunctivitis sicca, not specified as Sjogren's, bilateral: Secondary | ICD-10-CM | POA: Diagnosis not present

## 2017-07-16 DIAGNOSIS — H04123 Dry eye syndrome of bilateral lacrimal glands: Secondary | ICD-10-CM | POA: Diagnosis not present

## 2017-07-17 DIAGNOSIS — E1165 Type 2 diabetes mellitus with hyperglycemia: Secondary | ICD-10-CM | POA: Diagnosis not present

## 2017-07-17 DIAGNOSIS — M792 Neuralgia and neuritis, unspecified: Secondary | ICD-10-CM | POA: Diagnosis not present

## 2017-07-17 DIAGNOSIS — I10 Essential (primary) hypertension: Secondary | ICD-10-CM | POA: Diagnosis not present

## 2017-07-17 DIAGNOSIS — E7800 Pure hypercholesterolemia, unspecified: Secondary | ICD-10-CM | POA: Diagnosis not present

## 2017-08-20 DIAGNOSIS — L281 Prurigo nodularis: Secondary | ICD-10-CM | POA: Diagnosis not present

## 2017-08-20 DIAGNOSIS — L270 Generalized skin eruption due to drugs and medicaments taken internally: Secondary | ICD-10-CM | POA: Diagnosis not present

## 2017-08-26 ENCOUNTER — Encounter: Payer: Self-pay | Admitting: Podiatry

## 2017-08-26 ENCOUNTER — Ambulatory Visit (INDEPENDENT_AMBULATORY_CARE_PROVIDER_SITE_OTHER): Payer: Medicare Other | Admitting: Podiatry

## 2017-08-26 DIAGNOSIS — Q828 Other specified congenital malformations of skin: Secondary | ICD-10-CM

## 2017-08-26 DIAGNOSIS — E1142 Type 2 diabetes mellitus with diabetic polyneuropathy: Secondary | ICD-10-CM

## 2017-08-26 DIAGNOSIS — M79609 Pain in unspecified limb: Secondary | ICD-10-CM | POA: Diagnosis not present

## 2017-08-26 DIAGNOSIS — B351 Tinea unguium: Secondary | ICD-10-CM | POA: Diagnosis not present

## 2017-08-26 NOTE — Progress Notes (Signed)
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  Objective: Vital signs are stable she is alert and oriented x3.  Pulses are palpable.  No change in neurovascular status with decreased neurological sensorium per Semmes Weinstein monofilament.  Toenails are long thick yellow dystrophic clinically mycotic.  Assessment: Hammertoe deformities.  Peripheral neuropathy.  Pain in the office.  Plan: Debridement of toenails 1 through 5 bilateral.  Started the process for evaluation issues.

## 2017-09-09 ENCOUNTER — Other Ambulatory Visit: Payer: Medicare Other

## 2017-09-23 DIAGNOSIS — M5380 Other specified dorsopathies, site unspecified: Secondary | ICD-10-CM | POA: Diagnosis not present

## 2017-09-23 DIAGNOSIS — Z23 Encounter for immunization: Secondary | ICD-10-CM | POA: Diagnosis not present

## 2017-09-23 DIAGNOSIS — E119 Type 2 diabetes mellitus without complications: Secondary | ICD-10-CM | POA: Diagnosis not present

## 2017-09-23 DIAGNOSIS — R001 Bradycardia, unspecified: Secondary | ICD-10-CM | POA: Diagnosis not present

## 2017-09-23 DIAGNOSIS — I700 Atherosclerosis of aorta: Secondary | ICD-10-CM | POA: Diagnosis not present

## 2017-09-23 DIAGNOSIS — E7849 Other hyperlipidemia: Secondary | ICD-10-CM | POA: Diagnosis not present

## 2017-09-23 DIAGNOSIS — Z6823 Body mass index (BMI) 23.0-23.9, adult: Secondary | ICD-10-CM | POA: Diagnosis not present

## 2017-09-23 DIAGNOSIS — I10 Essential (primary) hypertension: Secondary | ICD-10-CM | POA: Diagnosis not present

## 2017-09-23 DIAGNOSIS — I2721 Secondary pulmonary arterial hypertension: Secondary | ICD-10-CM | POA: Diagnosis not present

## 2017-10-07 ENCOUNTER — Telehealth: Payer: Self-pay

## 2017-10-07 DIAGNOSIS — I10 Essential (primary) hypertension: Secondary | ICD-10-CM | POA: Diagnosis not present

## 2017-10-07 DIAGNOSIS — E119 Type 2 diabetes mellitus without complications: Secondary | ICD-10-CM | POA: Diagnosis not present

## 2017-10-07 DIAGNOSIS — Z8673 Personal history of transient ischemic attack (TIA), and cerebral infarction without residual deficits: Secondary | ICD-10-CM | POA: Diagnosis not present

## 2017-10-07 DIAGNOSIS — R0789 Other chest pain: Secondary | ICD-10-CM | POA: Diagnosis not present

## 2017-10-07 NOTE — Telephone Encounter (Signed)
Sent referral to scheduling 

## 2017-10-20 DIAGNOSIS — R0602 Shortness of breath: Secondary | ICD-10-CM | POA: Diagnosis not present

## 2017-10-20 DIAGNOSIS — R0789 Other chest pain: Secondary | ICD-10-CM | POA: Diagnosis not present

## 2017-10-29 DIAGNOSIS — R0789 Other chest pain: Secondary | ICD-10-CM | POA: Diagnosis not present

## 2017-10-29 DIAGNOSIS — R0602 Shortness of breath: Secondary | ICD-10-CM | POA: Diagnosis not present

## 2017-10-29 DIAGNOSIS — I10 Essential (primary) hypertension: Secondary | ICD-10-CM | POA: Diagnosis not present

## 2017-11-06 DIAGNOSIS — E119 Type 2 diabetes mellitus without complications: Secondary | ICD-10-CM | POA: Diagnosis not present

## 2017-11-06 DIAGNOSIS — I10 Essential (primary) hypertension: Secondary | ICD-10-CM | POA: Diagnosis not present

## 2017-11-06 DIAGNOSIS — Z8673 Personal history of transient ischemic attack (TIA), and cerebral infarction without residual deficits: Secondary | ICD-10-CM | POA: Diagnosis not present

## 2017-11-06 DIAGNOSIS — R0789 Other chest pain: Secondary | ICD-10-CM | POA: Diagnosis not present

## 2017-11-17 DIAGNOSIS — L299 Pruritus, unspecified: Secondary | ICD-10-CM | POA: Diagnosis not present

## 2017-11-17 DIAGNOSIS — L304 Erythema intertrigo: Secondary | ICD-10-CM | POA: Diagnosis not present

## 2017-11-17 DIAGNOSIS — L810 Postinflammatory hyperpigmentation: Secondary | ICD-10-CM | POA: Diagnosis not present

## 2017-11-25 DIAGNOSIS — R935 Abnormal findings on diagnostic imaging of other abdominal regions, including retroperitoneum: Secondary | ICD-10-CM | POA: Diagnosis not present

## 2017-12-02 ENCOUNTER — Encounter: Payer: Self-pay | Admitting: Podiatry

## 2017-12-02 ENCOUNTER — Ambulatory Visit: Payer: Medicare Other | Admitting: Orthotics

## 2017-12-02 ENCOUNTER — Ambulatory Visit (INDEPENDENT_AMBULATORY_CARE_PROVIDER_SITE_OTHER): Payer: Medicare Other | Admitting: Podiatry

## 2017-12-02 DIAGNOSIS — M2042 Other hammer toe(s) (acquired), left foot: Secondary | ICD-10-CM | POA: Diagnosis not present

## 2017-12-02 DIAGNOSIS — B351 Tinea unguium: Secondary | ICD-10-CM

## 2017-12-02 DIAGNOSIS — E1142 Type 2 diabetes mellitus with diabetic polyneuropathy: Secondary | ICD-10-CM | POA: Diagnosis not present

## 2017-12-02 DIAGNOSIS — M79609 Pain in unspecified limb: Secondary | ICD-10-CM

## 2017-12-02 DIAGNOSIS — M2041 Other hammer toe(s) (acquired), right foot: Secondary | ICD-10-CM

## 2017-12-02 DIAGNOSIS — Q828 Other specified congenital malformations of skin: Secondary | ICD-10-CM

## 2017-12-03 NOTE — Progress Notes (Signed)
She presents today chief complaint of painful elongated toenails corns and calluses.  Has a history of diabetes with diabetic peripheral neuropathy.  Objective: Vital signs are stable she is alert and oriented x3.  Pulses are palpable.  Neurologic sensorium is i slightly diminished degenerative flexors are intact.  Muscle strength is intact.  Toenails are long thick yellow dystrophic onychomycotic.  Assessment: Pain in limb secondary to onychomycosis diabetic peripheral neuropathy.  Plan: Pick up her diabetic shoes today and debrided toenails and calluses bilateral.  Follow-up with her in 3 months

## 2017-12-03 NOTE — Progress Notes (Signed)
Patient had appointment today for definitive and final diabetic shoe fitting and delivery.  Patient was seen by Betha, C.Ped, [facility].   The inserts fit well and accomplished full contact with the plantar surface of the foot bilateral; the shoes fit well and offered forefoot freedom, no noticible heel slippage, and good toe clearance w/ the insert in place.  Patient was advised to monitor of any skin irritation, breakdown.  Patient was satisfied with fit and function. 

## 2017-12-05 DIAGNOSIS — N858 Other specified noninflammatory disorders of uterus: Secondary | ICD-10-CM | POA: Diagnosis not present

## 2017-12-10 ENCOUNTER — Telehealth: Payer: Self-pay | Admitting: Podiatry

## 2017-12-10 NOTE — Telephone Encounter (Signed)
Pt left message stating she was to get inserts for her shoes that she picked up(diabetic) to make them fit and she did not get them and the shoes are too big. She was not sure if she needed an appt or not.   I returned call and left a message that she will need an appt with Liliane Channel (avail Monday morning) or Betha  (available Wednesday 7.10) and to call me to schedule the appt.

## 2017-12-15 ENCOUNTER — Ambulatory Visit: Payer: Medicare Other | Admitting: Orthotics

## 2017-12-15 DIAGNOSIS — Q828 Other specified congenital malformations of skin: Secondary | ICD-10-CM

## 2017-12-15 DIAGNOSIS — M2042 Other hammer toe(s) (acquired), left foot: Secondary | ICD-10-CM

## 2017-12-15 DIAGNOSIS — M2041 Other hammer toe(s) (acquired), right foot: Secondary | ICD-10-CM

## 2017-12-15 DIAGNOSIS — E1142 Type 2 diabetes mellitus with diabetic polyneuropathy: Secondary | ICD-10-CM

## 2017-12-15 NOTE — Progress Notes (Signed)
Reordered shoes 1/2 size smaller; too much gap in heel.

## 2017-12-18 DIAGNOSIS — E119 Type 2 diabetes mellitus without complications: Secondary | ICD-10-CM | POA: Diagnosis not present

## 2017-12-18 DIAGNOSIS — H35033 Hypertensive retinopathy, bilateral: Secondary | ICD-10-CM | POA: Diagnosis not present

## 2017-12-18 DIAGNOSIS — H40023 Open angle with borderline findings, high risk, bilateral: Secondary | ICD-10-CM | POA: Diagnosis not present

## 2017-12-18 DIAGNOSIS — H26491 Other secondary cataract, right eye: Secondary | ICD-10-CM | POA: Diagnosis not present

## 2017-12-22 ENCOUNTER — Ambulatory Visit: Payer: Medicare Other | Admitting: Orthotics

## 2017-12-22 DIAGNOSIS — M2042 Other hammer toe(s) (acquired), left foot: Secondary | ICD-10-CM

## 2017-12-22 DIAGNOSIS — Q828 Other specified congenital malformations of skin: Secondary | ICD-10-CM

## 2017-12-22 DIAGNOSIS — M2041 Other hammer toe(s) (acquired), right foot: Secondary | ICD-10-CM

## 2017-12-22 NOTE — Progress Notes (Signed)
Patient will tr with socks; however still heel slippage with smaller size.  May have to go with different shoe to accommodate narrow heel

## 2018-01-07 DIAGNOSIS — R0600 Dyspnea, unspecified: Secondary | ICD-10-CM | POA: Diagnosis not present

## 2018-01-07 DIAGNOSIS — R5381 Other malaise: Secondary | ICD-10-CM | POA: Diagnosis not present

## 2018-01-07 DIAGNOSIS — R001 Bradycardia, unspecified: Secondary | ICD-10-CM | POA: Diagnosis not present

## 2018-01-07 DIAGNOSIS — Z79899 Other long term (current) drug therapy: Secondary | ICD-10-CM | POA: Diagnosis not present

## 2018-01-07 DIAGNOSIS — E785 Hyperlipidemia, unspecified: Secondary | ICD-10-CM | POA: Diagnosis not present

## 2018-01-07 DIAGNOSIS — I10 Essential (primary) hypertension: Secondary | ICD-10-CM | POA: Diagnosis not present

## 2018-01-07 DIAGNOSIS — R6889 Other general symptoms and signs: Secondary | ICD-10-CM | POA: Diagnosis not present

## 2018-01-07 DIAGNOSIS — E119 Type 2 diabetes mellitus without complications: Secondary | ICD-10-CM | POA: Diagnosis not present

## 2018-01-07 DIAGNOSIS — R51 Headache: Secondary | ICD-10-CM | POA: Diagnosis not present

## 2018-01-27 ENCOUNTER — Telehealth: Payer: Self-pay | Admitting: Podiatry

## 2018-01-27 NOTE — Telephone Encounter (Signed)
Pt called and would like you to call her back please.

## 2018-01-30 DIAGNOSIS — E782 Mixed hyperlipidemia: Secondary | ICD-10-CM | POA: Diagnosis not present

## 2018-01-30 DIAGNOSIS — R0989 Other specified symptoms and signs involving the circulatory and respiratory systems: Secondary | ICD-10-CM | POA: Diagnosis not present

## 2018-01-30 DIAGNOSIS — E119 Type 2 diabetes mellitus without complications: Secondary | ICD-10-CM | POA: Diagnosis not present

## 2018-01-30 DIAGNOSIS — I10 Essential (primary) hypertension: Secondary | ICD-10-CM | POA: Diagnosis not present

## 2018-02-02 DIAGNOSIS — E65 Localized adiposity: Secondary | ICD-10-CM | POA: Diagnosis not present

## 2018-02-02 DIAGNOSIS — H02403 Unspecified ptosis of bilateral eyelids: Secondary | ICD-10-CM | POA: Diagnosis not present

## 2018-02-02 DIAGNOSIS — H02839 Dermatochalasis of unspecified eye, unspecified eyelid: Secondary | ICD-10-CM | POA: Diagnosis not present

## 2018-02-02 DIAGNOSIS — H02841 Edema of right upper eyelid: Secondary | ICD-10-CM | POA: Diagnosis not present

## 2018-02-04 ENCOUNTER — Other Ambulatory Visit: Payer: Self-pay

## 2018-02-04 ENCOUNTER — Emergency Department (HOSPITAL_COMMUNITY)
Admission: EM | Admit: 2018-02-04 | Discharge: 2018-02-04 | Disposition: A | Discharge status: Home / Self Care | Payer: Medicare Other | Attending: Emergency Medicine | Admitting: Emergency Medicine

## 2018-02-04 ENCOUNTER — Encounter (HOSPITAL_COMMUNITY): Payer: Self-pay | Admitting: *Deleted

## 2018-02-04 DIAGNOSIS — E1140 Type 2 diabetes mellitus with diabetic neuropathy, unspecified: Secondary | ICD-10-CM | POA: Diagnosis not present

## 2018-02-04 DIAGNOSIS — J45909 Unspecified asthma, uncomplicated: Secondary | ICD-10-CM | POA: Diagnosis not present

## 2018-02-04 DIAGNOSIS — Z7982 Long term (current) use of aspirin: Secondary | ICD-10-CM | POA: Diagnosis not present

## 2018-02-04 DIAGNOSIS — Z79899 Other long term (current) drug therapy: Secondary | ICD-10-CM | POA: Diagnosis not present

## 2018-02-04 DIAGNOSIS — I10 Essential (primary) hypertension: Secondary | ICD-10-CM | POA: Diagnosis not present

## 2018-02-04 DIAGNOSIS — M79601 Pain in right arm: Secondary | ICD-10-CM | POA: Diagnosis not present

## 2018-02-04 DIAGNOSIS — M79603 Pain in arm, unspecified: Secondary | ICD-10-CM | POA: Diagnosis not present

## 2018-02-04 LAB — COMPREHENSIVE METABOLIC PANEL
ALBUMIN: 3.8 g/dL (ref 3.5–5.0)
ALT: 22 U/L (ref 0–44)
ANION GAP: 11 (ref 5–15)
AST: 26 U/L (ref 15–41)
Alkaline Phosphatase: 54 U/L (ref 38–126)
BUN: 13 mg/dL (ref 8–23)
CHLORIDE: 105 mmol/L (ref 98–111)
CO2: 25 mmol/L (ref 22–32)
Calcium: 9.5 mg/dL (ref 8.9–10.3)
Creatinine, Ser: 0.8 mg/dL (ref 0.44–1.00)
GFR calc Af Amer: >60 mL/min (ref >60)
GFR calc non Af Amer: >60 mL/min (ref >60)
GLUCOSE: 105 mg/dL — AB (ref 70–99)
POTASSIUM: 4.1 mmol/L (ref 3.5–5.1)
SODIUM: 141 mmol/L (ref 135–145)
Total Bilirubin: 0.9 mg/dL (ref 0.3–1.2)
Total Protein: 7 g/dL (ref 6.5–8.1)

## 2018-02-04 LAB — CBC
HCT: 39.9 % (ref 36.0–46.0)
HEMOGLOBIN: 12.7 g/dL (ref 12.0–15.0)
MCH: 28.7 pg (ref 26.0–34.0)
MCHC: 31.8 g/dL (ref 30.0–36.0)
MCV: 90.3 fL (ref 78.0–100.0)
Platelets: 215 10*3/uL (ref 150–400)
RBC: 4.42 MIL/uL (ref 3.87–5.11)
RDW: 13.3 % (ref 11.5–15.5)
WBC: 6.6 10*3/uL (ref 4.0–10.5)

## 2018-02-04 LAB — I-STAT TROPONIN, ED: TROPONIN I, POC: 0.01 ng/mL (ref 0.00–0.08)

## 2018-02-04 LAB — CBG MONITORING, ED: Glucose-Capillary: 101 mg/dL — ABNORMAL HIGH (ref 70–99)

## 2018-02-04 MED ORDER — ASPIRIN EC 325 MG PO TBEC
325.0000 mg | DELAYED_RELEASE_TABLET | Freq: Once | ORAL | Status: AC
  Administered 2018-02-04: 325 mg via ORAL
  Filled 2018-02-04: qty 1

## 2018-02-04 NOTE — ED Provider Notes (Signed)
[facility]ERGENCY DEPARTMENT Provider Note   CSN: [id number] Arrival date & time: 02/04/18  1339     History   Chief Complaint Chief Complaint  Patient presents with  . Hypertension  . Arm Pain    HPI Sara Brown is a 77 y.o. female.  Patient c/o concern for her blood pressure being high, and also had pain to right shoulder area earlier today. States hx htn, takes med for same, denies recent change in meds or doses. Right shoulder and right neck area pain, dull, moderate. No associated numbness/weakness. No loss of normal functional ability. No change in speech or vision. No falls. Denies chest pain or sob. No abd pain.   The history is provided by the patient and the EMS personnel.  Hypertension  Pertinent negatives include no chest pain, no headaches and no shortness of breath.  Arm Pain  Pertinent negatives include no chest pain, no headaches and no shortness of breath.    Past Medical History:  Diagnosis Date  . Allergic rhinitis   . Asthma   . Carotid stenosis    ICA(L)  . Constipation   . Diabetes mellitus without complication ([place])   . GERD (gastroesophageal reflux disease)   . Hyperlipidemia   . Hypertension   . Neuropathy    face (R)  . OA (osteoarthritis)   . Osteoporosis   . Vitamin D deficiency     Patient Active Problem List   Diagnosis Date Noted  . GERD (gastroesophageal reflux disease)   . Shortness of breath 07/30/2007  . COUGH 07/30/2007    Past Surgical History:  Procedure Laterality Date  . CESAREAN SECTION    . CESAREAN SECTION       OB History   None      Home Medications    Prior to Admission medications   Medication Sig Start Date End Date Taking? Authorizing Provider  acetaminophen (TYLENOL) 500 MG tablet Take 1,000 mg by mouth every 6 (six) hours as needed for mild pain.    [provider]  amLODipine (NORVASC) 10 MG tablet Take 5 mg by mouth daily.    [provider]    aspirin 81 MG tablet Take 81 mg by mouth daily.    [provider]  carboxymethylcellulose (REFRESH PLUS) 0.5 % SOLN Place 2 drops into both eyes 4 (four) times daily as needed (dry eyes). As directed     [provider]  clobetasol cream (TEMOVATE) 0.05 % APPLY TO RASH WHILE SKIN IS WET ONCE DAILY. DO NOT APPLY TO FACE OR FOLDS. 11/16/15   [provider]  cloNIDine (CATAPRES) 0.1 MG tablet TAKE 1 TABLET BY MOUTH NIGHTLY AT BEDTIME 10/01/17   [provider]  cycloSPORINE (RESTASIS) 0.05 % ophthalmic emulsion Place 1 drop into both eyes every evening.     [provider]  Emollient (CERAVE EX) Apply 1 application topically 3 (three) times daily as needed (itching).    [provider]  fish oil-omega-3 fatty acids 1000 MG capsule Take 2 g by mouth every evening.     [provider]  fluticasone (FLONASE) 50 MCG/ACT nasal spray Place 2 sprays into both nostrils daily as needed for allergies.     [provider]  hydrALAZINE (APRESOLINE) 10 MG tablet hydralazine 10 mg tablet 05/22/17   [provider]  lansoprazole (PREVACID) 15 MG capsule Take 15 mg by mouth daily as needed (acid reflux).     [provider]  losartan (  COZAAR) 25 MG tablet Take 25 mg by mouth every evening.     [provider]  magnesium 30 MG tablet Take 30 mg by mouth every evening.     [provider]  Multiple Vitamins-Minerals (MULTIVITAMIN WITH MINERALS) tablet Take 1 tablet by mouth every evening.     [provider]  Pitavastatin Calcium (LIVALO PO) Take 1 tablet by mouth daily.    [provider]  Polyethyl Glycol-Propyl Glycol (SYSTANE OP) Place 2 drops into both eyes 2 (two) times daily as needed (dry eyes).    [provider]  polyethylene glycol (MIRALAX / GLYCOLAX) packet Take 17 g by mouth daily as needed for mild constipation.    [provider]  pyridOXINE (VITAMIN B-6) 100 MG  tablet Take 100 mg by mouth every evening.     [provider]  Soap & Cleansers (AQUANIL SKIN CLEANSER EX) Apply 1 application topically 2 (two) times daily.     [provider]  spironolactone (ALDACTONE) 50 MG tablet Take 50 mg by mouth 2 (two) times daily.    [provider]  triamcinolone cream (KENALOG) 0.1 % Apply 1 application topically 2 (two) times daily as needed (rash/ eczema). To affected area as needed to rash 11/01/14   [provider]  vitamin B-12 (CYANOCOBALAMIN) 1000 MCG tablet Take 1,000 mcg by mouth every evening.     [provider]  vitamin E 100 UNIT capsule Take 100 Units by mouth every evening.     [provider]    Family History Family History  Problem Relation Age of Onset  . Diabetes Mellitus II Mother   . Osteoporosis Mother   . Hypertension Mother   . Diabetes Mellitus II Father   . CVA Sister        4  . CAD Sister        4  . Hypertension Sister        4  . Osteoarthritis Sister        4  . Diabetes Mellitus II Sister        4  . Dementia Sister        decreased  . Alcoholism Brother     Social History Social History   Tobacco Use  . Smoking status: Never Smoker  . Smokeless tobacco: Never Used  Substance Use Topics  . Alcohol use: No    Alcohol/week: 0.0 standard drinks  . Drug use: No     Allergies   Amlodipine; Naproxen; and Prednisone   Review of Systems Review of Systems  Constitutional: Negative for fever.  HENT: Negative for sore throat.   Eyes: Negative for visual disturbance.  Respiratory: Negative for shortness of breath.   Cardiovascular: Negative for chest pain.  Gastrointestinal: Negative for vomiting.  Genitourinary: Negative for flank pain.  Musculoskeletal: Negative for back pain.  Skin: Negative for rash.  Neurological: Negative for speech difficulty, weakness, numbness and headaches.  Hematological: Does not bruise/bleed easily.  Psychiatric/Behavioral:  Negative for confusion.     Physical Exam Updated Vital Signs BP (!) 141/86 (BP Location: Right Arm)   Pulse (!) 49   Temp 98.4 F (36.9 C) (Oral)   Resp 16   Ht 1.689 m (5' 6.5")   Wt 64.4 kg   SpO2 99%   BMI 22.58 kg/m   Physical Exam  Constitutional: She is oriented to person, place, and time. She appears well-developed and well-nourished.  HENT:  Head: Atraumatic.  Eyes: Pupils are  equal, round, and reactive to light. Conjunctivae are normal. No scleral icterus.  Neck: Normal range of motion. Neck supple. No tracheal deviation present. No thyromegaly present.  No bruits.   Cardiovascular: Normal rate, regular rhythm, normal heart sounds and intact distal pulses. Exam reveals no gallop and no friction rub.  No murmur heard. Pulmonary/Chest: Effort normal and breath sounds normal. No respiratory distress.  Abdominal: Soft. Normal appearance and bowel sounds are normal. She exhibits no distension. There is no tenderness. There is no guarding.  Genitourinary:  Genitourinary Comments: No cva tenderness  Musculoskeletal: She exhibits no edema.  Neurological: She is alert and oriented to person, place, and time.  Speech clear/fluent. No pronator drift. Motor fxn intact bil, stre 5/5. sens grossly intact. Steady gait.   Skin: Skin is warm and dry. No rash noted.  Psychiatric: She has a normal mood and affect.  Nursing note and vitals reviewed.    ED Treatments / Results  Labs (all labs ordered are listed, but only abnormal results are displayed) Results for orders placed or performed during the hospital encounter of 02/04/18  CBC  Result Value Ref Range   WBC 6.6 4.0 - 10.5 K/uL   RBC 4.42 3.87 - 5.11 MIL/uL   Hemoglobin 12.7 12.0 - 15.0 g/dL   HCT 39.9 36.0 - 46.0 %   MCV 90.3 78.0 - 100.0 fL   MCH 28.7 26.0 - 34.0 pg   MCHC 31.8 30.0 - 36.0 g/dL   RDW 13.3 11.5 - 15.5 %   Platelets 215 150 - 400 K/uL  Comprehensive metabolic panel  Result Value Ref Range   Sodium  141 135 - 145 mmol/L   Potassium 4.1 3.5 - 5.1 mmol/L   Chloride 105 98 - 111 mmol/L   CO2 25 22 - 32 mmol/L   Glucose, Bld 105 (H) 70 - 99 mg/dL   BUN 13 8 - 23 mg/dL   Creatinine, Ser 0.80 0.44 - 1.00 mg/dL   Calcium 9.5 8.9 - 10.3 mg/dL   Total Protein 7.0 6.5 - 8.1 g/dL   Albumin 3.8 3.5 - 5.0 g/dL   AST 26 15 - 41 U/L   ALT 22 0 - 44 U/L   Alkaline Phosphatase 54 38 - 126 U/L   Total Bilirubin 0.9 0.3 - 1.2 mg/dL   GFR calc non Af Amer >60 >60 mL/min   GFR calc Af Amer >60 >60 mL/min   Anion gap 11 5 - 15  I-stat troponin, ED  Result Value Ref Range   Troponin i, poc 0.01 0.00 - 0.08 ng/mL   Comment 3          CBG monitoring, ED  Result Value Ref Range   Glucose-Capillary 101 (H) 70 - 99 mg/dL    EKG EKG Interpretation  Date/Time:  Wednesday February 04 2018 13:58:11 EDT Ventricular Rate:  50 PR Interval:    QRS Duration: 98 QT Interval:  448 QTC Calculation: 409 R Axis:   -32 Text Interpretation:  Sinus rhythm Probable left atrial enlargement Left axis deviation Low voltage, precordial leads ` no acute changes from prior except rate slower Confirmed by Lajean Saver [phone]) on 02/04/2018 3:23:03 PM   Radiology No results found.  Procedures Procedures (including critical care time)  Medications Ordered in ED Medications - No data to display   Initial Impression / Assessment and Plan / ED Course  I have reviewed the triage vital signs and the nursing notes.  Pertinent labs & imaging results that were  available during my care of the patient were reviewed by me and considered in my medical decision making (see chart for details).  Ecg, labs. Monitor.   Reviewed nursing notes and prior charts for additional history.   Labs reviewed - chem normal.  Recheck, no numbness/weakness, no change in speech or vision.  Discussed mri given right arm 'feeling funny', and report face feeling strange earlier - pt refuses mri. Pt notes mri for similar symptoms 2016 - no  acute cva then.  Pt continues to refuse CT or MRI during current visit, even after discussing MR as the definitive test for identifying possible small cva. Pt declines MR. Pt is willing to follow up as outpt with neurology.  Also discussed was hr as low as 48 in ED, sinus. Pt is not on beta blocker or other rate control therapy. Pt indicates her cardiologist is Dr Einar Gip, and she prefers to follow up with him as outpt.  Pt currently asymptomatic, and appears stable for d/c.     Final Clinical Impressions(s) / ED Diagnoses   Final diagnoses:  None    ED Discharge Orders    None       Lajean Saver, MD 02/04/18 1550

## 2018-02-04 NOTE — Discharge Instructions (Addendum)
It was our pleasure to provide your ER care today - we hope that you feel better.  Take an enteric coated aspirin a day.  For slow heart rate, follow up with your cardiologist in the coming week - call office to arrange appointment.   Also follow up with neurologist in the next 1-2 weeks - see referral - call office for appointment.   Return to ER if worse, new symptoms, worsening or severe pain, loss of sensation/weakness, problems with gait/speech/or vision, fainting, other concern.

## 2018-02-04 NOTE — ED Triage Notes (Addendum)
Pt here from home via [facility] R sided face and arm discomfort since 4 am.  States sbp was greater than 200 this am.  BP 178/98, hr 54, rr 18 spo2 97% cbg 119.  Pt also required assistance to ambulate to bathroom, stating she felt off-balance.  Per gems, symptoms appeared to resolve as bp decreased.

## 2018-02-04 NOTE — ED Notes (Signed)
Pt ambulated self efficiently to restroom with no difficulty. 

## 2018-02-09 ENCOUNTER — Other Ambulatory Visit: Payer: Self-pay

## 2018-02-09 ENCOUNTER — Emergency Department (HOSPITAL_COMMUNITY): Payer: Medicare Other

## 2018-02-09 ENCOUNTER — Encounter (HOSPITAL_COMMUNITY): Payer: Self-pay

## 2018-02-09 ENCOUNTER — Observation Stay (HOSPITAL_COMMUNITY)
Admission: EM | Admit: 2018-02-09 | Discharge: 2018-02-11 | Disposition: A | Discharge status: Home / Self Care | Payer: Medicare Other | Attending: Internal Medicine | Admitting: Internal Medicine

## 2018-02-09 DIAGNOSIS — J45909 Unspecified asthma, uncomplicated: Secondary | ICD-10-CM | POA: Insufficient documentation

## 2018-02-09 DIAGNOSIS — R0789 Other chest pain: Principal | ICD-10-CM | POA: Insufficient documentation

## 2018-02-09 DIAGNOSIS — I10 Essential (primary) hypertension: Secondary | ICD-10-CM | POA: Diagnosis not present

## 2018-02-09 DIAGNOSIS — I25119 Atherosclerotic heart disease of native coronary artery with unspecified angina pectoris: Secondary | ICD-10-CM | POA: Diagnosis present

## 2018-02-09 DIAGNOSIS — E785 Hyperlipidemia, unspecified: Secondary | ICD-10-CM | POA: Insufficient documentation

## 2018-02-09 DIAGNOSIS — I2510 Atherosclerotic heart disease of native coronary artery without angina pectoris: Secondary | ICD-10-CM | POA: Insufficient documentation

## 2018-02-09 DIAGNOSIS — K219 Gastro-esophageal reflux disease without esophagitis: Secondary | ICD-10-CM | POA: Diagnosis not present

## 2018-02-09 DIAGNOSIS — R0602 Shortness of breath: Secondary | ICD-10-CM | POA: Diagnosis present

## 2018-02-09 DIAGNOSIS — Z8673 Personal history of transient ischemic attack (TIA), and cerebral infarction without residual deficits: Secondary | ICD-10-CM | POA: Insufficient documentation

## 2018-02-09 DIAGNOSIS — Z7982 Long term (current) use of aspirin: Secondary | ICD-10-CM | POA: Insufficient documentation

## 2018-02-09 DIAGNOSIS — I208 Other forms of angina pectoris: Secondary | ICD-10-CM

## 2018-02-09 DIAGNOSIS — Z8249 Family history of ischemic heart disease and other diseases of the circulatory system: Secondary | ICD-10-CM | POA: Diagnosis not present

## 2018-02-09 DIAGNOSIS — K5900 Constipation, unspecified: Secondary | ICD-10-CM | POA: Insufficient documentation

## 2018-02-09 DIAGNOSIS — R079 Chest pain, unspecified: Secondary | ICD-10-CM | POA: Diagnosis present

## 2018-02-09 DIAGNOSIS — G43109 Migraine with aura, not intractable, without status migrainosus: Secondary | ICD-10-CM | POA: Insufficient documentation

## 2018-02-09 DIAGNOSIS — I2089 Other forms of angina pectoris: Secondary | ICD-10-CM | POA: Diagnosis present

## 2018-02-09 DIAGNOSIS — E1140 Type 2 diabetes mellitus with diabetic neuropathy, unspecified: Secondary | ICD-10-CM | POA: Insufficient documentation

## 2018-02-09 DIAGNOSIS — R202 Paresthesia of skin: Secondary | ICD-10-CM | POA: Diagnosis not present

## 2018-02-09 DIAGNOSIS — I6522 Occlusion and stenosis of left carotid artery: Secondary | ICD-10-CM | POA: Insufficient documentation

## 2018-02-09 DIAGNOSIS — M1990 Unspecified osteoarthritis, unspecified site: Secondary | ICD-10-CM | POA: Diagnosis not present

## 2018-02-09 DIAGNOSIS — Z7951 Long term (current) use of inhaled steroids: Secondary | ICD-10-CM | POA: Diagnosis not present

## 2018-02-09 DIAGNOSIS — M810 Age-related osteoporosis without current pathological fracture: Secondary | ICD-10-CM | POA: Diagnosis not present

## 2018-02-09 DIAGNOSIS — R001 Bradycardia, unspecified: Secondary | ICD-10-CM | POA: Diagnosis present

## 2018-02-09 DIAGNOSIS — R05 Cough: Secondary | ICD-10-CM | POA: Diagnosis present

## 2018-02-09 DIAGNOSIS — R51 Headache: Secondary | ICD-10-CM | POA: Diagnosis not present

## 2018-02-09 DIAGNOSIS — R059 Cough, unspecified: Secondary | ICD-10-CM | POA: Diagnosis present

## 2018-02-09 HISTORY — DX: Unspecified osteoarthritis, unspecified site: M19.90

## 2018-02-09 HISTORY — DX: Other complications of anesthesia, initial encounter: T88.59XA

## 2018-02-09 HISTORY — DX: Adverse effect of unspecified anesthetic, initial encounter: T41.45XA

## 2018-02-09 HISTORY — DX: Headache, unspecified: R51.9

## 2018-02-09 HISTORY — DX: Other forms of angina pectoris: I20.89

## 2018-02-09 HISTORY — DX: Transient cerebral ischemic attack, unspecified: G45.9

## 2018-02-09 HISTORY — DX: Bradycardia, unspecified: R00.1

## 2018-02-09 HISTORY — DX: Other forms of angina pectoris: I20.8

## 2018-02-09 HISTORY — DX: Type 2 diabetes mellitus without complications: E11.9

## 2018-02-09 HISTORY — DX: Headache: R51

## 2018-02-09 HISTORY — DX: Atherosclerotic heart disease of native coronary artery with unspecified angina pectoris: I25.119

## 2018-02-09 LAB — URINALYSIS, ROUTINE W REFLEX MICROSCOPIC
Bilirubin Urine: NEGATIVE
Glucose, UA: NEGATIVE mg/dL
Hgb urine dipstick: NEGATIVE
Ketones, ur: NEGATIVE mg/dL
Leukocytes, UA: NEGATIVE
Nitrite: NEGATIVE
Protein, ur: NEGATIVE mg/dL
Specific Gravity, Urine: 1.006 (ref 1.005–1.030)
pH: 7 (ref 5.0–8.0)

## 2018-02-09 LAB — CBC
HEMATOCRIT: 46.3 % — AB (ref 36.0–46.0)
HEMOGLOBIN: 14.4 g/dL (ref 12.0–15.0)
MCH: 28.7 pg (ref 26.0–34.0)
MCHC: 31.1 g/dL (ref 30.0–36.0)
MCV: 92.4 fL (ref 78.0–100.0)
PLATELETS: 242 10*3/uL (ref 150–400)
RBC: 5.01 MIL/uL (ref 3.87–5.11)
RDW: 13.5 % (ref 11.5–15.5)
WBC: 6 10*3/uL (ref 4.0–10.5)

## 2018-02-09 LAB — BASIC METABOLIC PANEL
ANION GAP: 12 (ref 5–15)
BUN: 16 mg/dL (ref 8–23)
CHLORIDE: 101 mmol/L (ref 98–111)
CO2: 29 mmol/L (ref 22–32)
Calcium: 10.5 mg/dL — ABNORMAL HIGH (ref 8.9–10.3)
Creatinine, Ser: 0.98 mg/dL (ref 0.44–1.00)
GFR calc non Af Amer: 53 mL/min — ABNORMAL LOW (ref >60)
Glucose, Bld: 125 mg/dL — ABNORMAL HIGH (ref 70–99)
POTASSIUM: 4.7 mmol/L (ref 3.5–5.1)
SODIUM: 142 mmol/L (ref 135–145)

## 2018-02-09 LAB — TROPONIN I: Troponin I: <0.03 ng/mL (ref <0.03)

## 2018-02-09 LAB — I-STAT TROPONIN, ED
Troponin i, poc: 0 ng/mL (ref 0.00–0.08)
Troponin i, poc: 0.02 ng/mL (ref 0.00–0.08)

## 2018-02-09 LAB — PROTIME-INR
INR: 0.97
Prothrombin Time: 12.8 seconds (ref 11.4–15.2)

## 2018-02-09 LAB — GLUCOSE, CAPILLARY: Glucose-Capillary: 81 mg/dL (ref 70–99)

## 2018-02-09 LAB — ETHANOL: Alcohol, Ethyl (B): <10 mg/dL (ref <10)

## 2018-02-09 LAB — APTT: aPTT: 27 seconds (ref 24–36)

## 2018-02-09 MED ORDER — CLOBETASOL PROPIONATE 0.05 % EX CREA: TOPICAL_CREAM | Freq: Two times a day (BID) | CUTANEOUS | Status: DC

## 2018-02-09 MED ORDER — CETAPHIL MOISTURIZING EX LOTN: TOPICAL_LOTION | Freq: Three times a day (TID) | CUTANEOUS | Status: DC | PRN

## 2018-02-09 MED ORDER — CLONIDINE HCL 0.1 MG PO TABS
0.1000 mg | ORAL_TABLET | Freq: Every day | ORAL | Status: DC
  Administered 2018-02-09 – 2018-02-10 (×2): 0.1 mg via ORAL
  Filled 2018-02-09 (×2): qty 1

## 2018-02-09 MED ORDER — HYDRALAZINE HCL 10 MG PO TABS
10.0000 mg | ORAL_TABLET | Freq: Three times a day (TID) | ORAL | Status: DC
  Administered 2018-02-10 – 2018-02-11 (×2): 10 mg via ORAL
  Filled 2018-02-09 (×4): qty 1

## 2018-02-09 MED ORDER — HEPARIN BOLUS VIA INFUSION
4000.0000 [IU] | Freq: Once | INTRAVENOUS | Status: AC
  Administered 2018-02-09: 4000 [IU] via INTRAVENOUS
  Filled 2018-02-09: qty 4000

## 2018-02-09 MED ORDER — LOSARTAN POTASSIUM 25 MG PO TABS
25.0000 mg | ORAL_TABLET | Freq: Every evening | ORAL | Status: DC
  Filled 2018-02-09 (×2): qty 1

## 2018-02-09 MED ORDER — ONDANSETRON HCL 4 MG/2ML IJ SOLN: 4.0000 mg | Freq: Four times a day (QID) | INTRAMUSCULAR | Status: DC | PRN

## 2018-02-09 MED ORDER — PANTOPRAZOLE SODIUM 20 MG PO TBEC
20.0000 mg | DELAYED_RELEASE_TABLET | Freq: Every day | ORAL | Status: DC
  Administered 2018-02-10 – 2018-02-11 (×2): 20 mg via ORAL
  Filled 2018-02-09 (×2): qty 1

## 2018-02-09 MED ORDER — ASPIRIN EC 81 MG PO TBEC
81.0000 mg | DELAYED_RELEASE_TABLET | Freq: Every day | ORAL | Status: DC
  Administered 2018-02-10 – 2018-02-11 (×2): 81 mg via ORAL
  Filled 2018-02-09 (×2): qty 1

## 2018-02-09 MED ORDER — CYCLOSPORINE 0.05 % OP EMUL
1.0000 [drp] | Freq: Every evening | OPHTHALMIC | Status: DC
  Administered 2018-02-10: 1 [drp] via OPHTHALMIC
  Filled 2018-02-09 (×2): qty 1

## 2018-02-09 MED ORDER — POLYETHYLENE GLYCOL 3350 17 G PO PACK: 17.0000 g | PACK | Freq: Every day | ORAL | Status: DC | PRN

## 2018-02-09 MED ORDER — VITAMIN B-12 1000 MCG PO TABS
1000.0000 ug | ORAL_TABLET | Freq: Every evening | ORAL | Status: DC
  Administered 2018-02-10: 1000 ug via ORAL
  Filled 2018-02-09: qty 1

## 2018-02-09 MED ORDER — OMEGA-3-ACID ETHYL ESTERS 1 G PO CAPS
1.0000 g | ORAL_CAPSULE | Freq: Every evening | ORAL | Status: DC
  Administered 2018-02-10: 1 g via ORAL
  Filled 2018-02-09: qty 1

## 2018-02-09 MED ORDER — VITAMIN E 45 MG (100 UNIT) PO CAPS
100.0000 [IU] | ORAL_CAPSULE | Freq: Every evening | ORAL | Status: DC
  Administered 2018-02-10: 100 [IU] via ORAL
  Filled 2018-02-09 (×2): qty 1

## 2018-02-09 MED ORDER — AMLODIPINE BESYLATE 5 MG PO TABS: 5.0000 mg | ORAL_TABLET | Freq: Every day | ORAL | Status: DC

## 2018-02-09 MED ORDER — VITAMIN B-6 100 MG PO TABS
100.0000 mg | ORAL_TABLET | Freq: Every evening | ORAL | Status: DC
  Administered 2018-02-10: 100 mg via ORAL
  Filled 2018-02-09 (×2): qty 1

## 2018-02-09 MED ORDER — SPIRONOLACTONE 25 MG PO TABS
50.0000 mg | ORAL_TABLET | Freq: Two times a day (BID) | ORAL | Status: DC
  Administered 2018-02-10 – 2018-02-11 (×2): 50 mg via ORAL
  Filled 2018-02-09 (×6): qty 2

## 2018-02-09 MED ORDER — TRIAMCINOLONE ACETONIDE 0.1 % EX CREA: 1.0000 "application " | TOPICAL_CREAM | Freq: Two times a day (BID) | CUTANEOUS | Status: DC | PRN

## 2018-02-09 MED ORDER — POLYVINYL ALCOHOL 1.4 % OP SOLN: 2.0000 [drp] | Freq: Four times a day (QID) | OPHTHALMIC | Status: DC | PRN

## 2018-02-09 MED ORDER — PRAVASTATIN SODIUM 20 MG PO TABS
20.0000 mg | ORAL_TABLET | Freq: Every day | ORAL | Status: DC
  Administered 2018-02-10: 20 mg via ORAL
  Filled 2018-02-09: qty 1

## 2018-02-09 MED ORDER — AQUANIL SKIN CLEANSER EX LOTN: TOPICAL_LOTION | Freq: Two times a day (BID) | CUTANEOUS | Status: DC

## 2018-02-09 MED ORDER — ACETAMINOPHEN 500 MG PO TABS: 1000.0000 mg | ORAL_TABLET | Freq: Four times a day (QID) | ORAL | Status: DC | PRN

## 2018-02-09 MED ORDER — FLUTICASONE PROPIONATE 50 MCG/ACT NA SUSP: 2.0000 | Freq: Every day | NASAL | Status: DC | PRN

## 2018-02-09 MED ORDER — MAGNESIUM 30 MG PO TABS: 30.0000 mg | ORAL_TABLET | Freq: Every evening | ORAL | Status: DC

## 2018-02-09 MED ORDER — POLYETHYL GLYCOL-PROPYL GLYCOL 0.4-0.3 % OP GEL: Freq: Two times a day (BID) | OPHTHALMIC | Status: DC | PRN

## 2018-02-09 MED ORDER — HEPARIN (PORCINE) IN NACL 100-0.45 UNIT/ML-% IJ SOLN
750.0000 [IU]/h | INTRAMUSCULAR | Status: DC
  Administered 2018-02-09: 750 [IU]/h via INTRAVENOUS
  Filled 2018-02-09: qty 250

## 2018-02-09 MED ORDER — ACETAMINOPHEN 325 MG PO TABS: 650.0000 mg | ORAL_TABLET | ORAL | Status: DC | PRN

## 2018-02-09 MED ORDER — ADULT MULTIVITAMIN W/MINERALS CH
1.0000 | ORAL_TABLET | Freq: Every evening | ORAL | Status: DC
  Administered 2018-02-10: 1 via ORAL
  Filled 2018-02-09: qty 1

## 2018-02-09 NOTE — ED Triage Notes (Signed)
Patient complains of ongoing right sided facial and right arm pain with tongue feeling funny. Seen last week for same and no diagnosis. On arrival alert and oriented, no pain. No slurred speech, no arm drift, no facial droop.

## 2018-02-09 NOTE — Consult Note (Addendum)
Neurology Consultation Reason for Consult: consult for admission  Referring Physician: Rodell Perna PA   CC: "strange feeling" on tongue and face  History is obtained from: patient   HPI: Sara Brown is a 77 y.o. female with PMH HTN, diastolic dysfunction, sensorineural hearing loss and s/p bilateral cataract extraction who presented to the ED by recommendation of her cardiologist after she called him with concern for chest pain this morning. In the ED she also noted a strange feeling on tongue and face and neurology was consulted.   The strange sensation on her face and tongue is difficult to describe but it feels somewhat like a tingling. The sensation comes on almost on a weekly bases and the first time she remembers experiencing it was about 10 years ago. Either side of her face will begin feeling numb, the numbness will travel down that one side of her face and can at times progress to involving the same sided arm. At this point in time is is her right face that is affected more than her left and the left side of her tongue. She notes that this morning she also noticed some wobbliness in the knees and felt that her gait was slowed from baseline. She does not have headaches with these symptoms but has had migraines in the past. She denies any symptoms of focal weakness or decreased sensation.   LKW: years  tpa given?: no  ROS: A 14 point ROS was performed and is negative except as noted in the HPI.   Past Medical History:  Diagnosis Date  . Allergic rhinitis   . Asthma   . Carotid stenosis    ICA(L)  . Constipation   . Diabetes mellitus without complication ([name])   . GERD (gastroesophageal reflux disease)   . Hyperlipidemia   . Hypertension   . Neuropathy    face (R)  . OA (osteoarthritis)   . Osteoporosis   . Vitamin D deficiency     Family History  Problem Relation Age of Onset  . Diabetes Mellitus II Mother   . Osteoporosis Mother   . Hypertension Mother   .  Diabetes Mellitus II Father   . CVA Sister        4  . CAD Sister        4  . Hypertension Sister        4  . Osteoarthritis Sister        4  . Diabetes Mellitus II Sister        4  . Dementia Sister        decreased  . Alcoholism Brother     Social History:  reports that she has never smoked. She has never used smokeless tobacco. She reports that she does not drink alcohol or use drugs.  Exam: Current vital signs: BP 128/75   Pulse (!) 50   Temp 98.2 F (36.8 C) (Oral)   Resp 13   SpO2 97%  Vital signs in last 24 hours: Temp:  [98.2 F (36.8 C)] 98.2 F (36.8 C) (09/02 1003) Pulse Rate:  [48-76] 50 (09/02 1615) Resp:  [11-21] 13 (09/02 1615) BP: (118-148)/(67-92) 128/75 (09/02 1615) SpO2:  [96 %-100 %] 97 % (09/02 1615)  Physical Exam  Constitutional: Appears well-developed and well-nourished.  Psych: Affect appropriate to situation Eyes: No scleral injection HENT: No OP obstrucion Head: Normocephalic.  Cardiovascular: Normal rate and regular rhythm.  Respiratory: Effort normal and breath sounds normal to anterior ascultation GI: Soft.  No distension. There is no tenderness.  Skin: WDI  Neuro: Mental Status: Patient is awake, alert, oriented to person, place, month, year, and situation. Patient is able to give a clear and coherent history. No signs of aphasia or neglect Cranial Nerves: II: Visual Fields are full. Pupils are equal, round, and reactive to light.  III,IV, VI: EOMI without ptosis or diploplia.  V: Facial sensation is symmetric to temperature VII: Facial movement is symmetric.  VIII: hearing is intact to voice X: Uvula elevates symmetrically XI: Shoulder shrug is symmetric. XII: tongue is midline without atrophy or fasciculations.  Motor: Tone is normal. Bulk is normal. 5/5 strength was present in all four extremities. No pronator drift Sensory: Sensation is symmetric to light touch and pin prick in the arms and legs.  Deep Tendon  Reflexes: 2+ and symmetric in the biceps and patellae.  Plantars: Toes are downgoing bilaterally.  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   Primary Diagnosis:  migraine aura    Secondary Diagnosis: Essential (primary) hypertension  Impression: [name] is a 77 year old woman with PMH HTN diastolic dysfunction, sensorineural hearing loss who presents with facial paresthesias. There are few possible etiologies of parasthesias that she is describing, the gradual onset of her symptoms in the setting of her history of migraines make migraine aura the most likely etiology. Migraine aura without headaches can happen after menopause and during pregnancy   Recommendations: 1) Discussed starting low dose TCA for migraine prophylaxis  2) Continued outpatient management of hypertension, pre- diabetes, and hyperlipidemia   Ledell Noss, MD  PGY 3, [facility]   Roland Rack, MD [facility] [phone]  If 7pm- 7am, please page neurology on call as listed in [place].

## 2018-02-09 NOTE — ED Notes (Signed)
Patient transported to CT 

## 2018-02-09 NOTE — ED Provider Notes (Signed)
Patient inherited from [facility], [place]. The patient presents with chest discomfort with radiation to her jaw and arm. Per her cardiologist, she will have catheterization tomorrow. Patient admitted to hospitalist service.  Patient care supervised by Dr. Ellender Hose.  Irven Baltimore, MD      Irven Baltimore, MD 02/10/18 Ezequiel Ganser    Duffy Bruce, MD 02/12/18 [phone]

## 2018-02-09 NOTE — Progress Notes (Signed)
Called report , ED RN not available for report at this time.

## 2018-02-09 NOTE — H&P (Signed)
History and Physical   Sara Brown NFA:[id number] DOB: 07-24-1940 DOA: 02/09/2018  Referring MD/NP/PA:  Rodell Perna, PA  PCP: Shon Baton, MD   Outpatient Specialists: Dr. Einar Gip cardiologist  Patient coming from: Home  Chief Complaint: Left arm and jaw pain numbness and weakness  HPI: Sara Brown is a 77 y.o. female with medical history significant of hypertension, currently at restenosis, asthma, constipation, diabetes, GERD and hyperlipidemia who came to the ER with a complaint of numbness and tingling of her face as well as neck and left arm.  She was initially thought to be having acute CVA.  Symptoms have been intermittent over a long period of time.  Associated with some chest pain.  Patient has some paresthesias as well.  She had tightness of her face and chest about 5 days ago and was seen in the ED.  Evaluation was recommended at the time but patient declined.  No visual change.  No focal weakness.  Regarding this.  Also she has noted intermittent left-sided chest pain since last night.  This was said to be sharp improving with rest.  It lasted about 40 minutes initially.  With no radiation.  She was seen in the ER and evaluated.  Based on recommendation by cardiologist she is being admitted for possible cardiac work-up.  She appears to be having continued unstable angina.  ED Course: Temperature is 97.5 blood pressure is 148/72 pulse 45-76 with respirate of 21 and oxygen sat 95% room air.  Lab work appears to be stable except for calcium of 10.5.  Initial troponin is negative.  Glucose is 125.  Head CT without contrast is negative.  EKG showed no active disease initial troponin is negative.  Cardiology has been consulted by ER recommended admitting patient for possible cardiac cath.  Patient was also evaluated by neurology.  She was thought to be having migraine aura with recommendations made.  CVA is thought to be less likely.  Review of Systems: As per HPI otherwise 10 point  review of systems negative.    Past Medical History:  Diagnosis Date  . Allergic rhinitis   . Asthma   . Carotid stenosis    ICA(L)  . Constipation   . Diabetes mellitus without complication ([place])   . GERD (gastroesophageal reflux disease)   . Hyperlipidemia   . Hypertension   . Neuropathy    face (R)  . OA (osteoarthritis)   . Osteoporosis   . Vitamin D deficiency     Past Surgical History:  Procedure Laterality Date  . CESAREAN SECTION    . CESAREAN SECTION       reports that she has never smoked. She has never used smokeless tobacco. She reports that she does not drink alcohol or use drugs.  Allergies  Allergen Reactions  . Amlodipine   . Naproxen     Patient preference   . Prednisone     Patient Preference     Family History  Problem Relation Age of Onset  . Diabetes Mellitus II Mother   . Osteoporosis Mother   . Hypertension Mother   . Diabetes Mellitus II Father   . CVA Sister        4  . CAD Sister        4  . Hypertension Sister        4  . Osteoarthritis Sister        4  . Diabetes Mellitus II Sister  4  . Dementia Sister        decreased  . Alcoholism Brother      Prior to Admission medications   Medication Sig Start Date End Date Taking? Authorizing Provider  acetaminophen (TYLENOL) 500 MG tablet Take 1,000 mg by mouth every 6 (six) hours as needed for mild pain.    [provider]  amLODipine (NORVASC) 10 MG tablet Take 5 mg by mouth daily.    [provider]  aspirin 81 MG tablet Take 81 mg by mouth daily.    [provider]  carboxymethylcellulose (REFRESH PLUS) 0.5 % SOLN Place 2 drops into both eyes 4 (four) times daily as needed (dry eyes). As directed     [provider]  clobetasol cream (TEMOVATE) 0.05 % APPLY TO RASH WHILE SKIN IS WET ONCE DAILY. DO NOT APPLY TO FACE OR FOLDS. 11/16/15   [provider]  cloNIDine (CATAPRES) 0.1 MG tablet TAKE 1 TABLET BY MOUTH NIGHTLY AT BEDTIME  10/01/17   [provider]  cycloSPORINE (RESTASIS) 0.05 % ophthalmic emulsion Place 1 drop into both eyes every evening.     [provider]  Emollient (CERAVE EX) Apply 1 application topically 3 (three) times daily as needed (itching).    [provider]  fish oil-omega-3 fatty acids 1000 MG capsule Take 2 g by mouth every evening.     [provider]  fluticasone (FLONASE) 50 MCG/ACT nasal spray Place 2 sprays into both nostrils daily as needed for allergies.     [provider]  hydrALAZINE (APRESOLINE) 10 MG tablet hydralazine 10 mg tablet 05/22/17   [provider]  lansoprazole (PREVACID) 15 MG capsule Take 15 mg by mouth daily as needed (acid reflux).     [provider]  losartan (COZAAR) 25 MG tablet Take 25 mg by mouth every evening.     [provider]  magnesium 30 MG tablet Take 30 mg by mouth every evening.     [provider]  Multiple Vitamins-Minerals (MULTIVITAMIN WITH MINERALS) tablet Take 1 tablet by mouth every evening.     [provider]  Pitavastatin Calcium (LIVALO PO) Take 1 tablet by mouth daily.    [provider]  Polyethyl Glycol-Propyl Glycol (SYSTANE OP) Place 2 drops into both eyes 2 (two) times daily as needed (dry eyes).    [provider]  polyethylene glycol (MIRALAX / GLYCOLAX) packet Take 17 g by mouth daily as needed for mild constipation.    [provider]  pyridOXINE (VITAMIN B-6) 100 MG tablet Take 100 mg by mouth every evening.     [provider]  Soap & Cleansers (AQUANIL SKIN CLEANSER EX) Apply 1 application topically 2 (two) times daily.     [provider]  spironolactone (ALDACTONE) 50 MG tablet Take 50 mg by mouth 2 (two) times daily.    [provider]  triamcinolone cream (KENALOG) 0.1 % Apply 1 application topically 2 (two) times daily as needed (rash/ eczema). To affected area as needed to rash 11/01/14    [provider]  vitamin B-12 (CYANOCOBALAMIN) 1000 MCG tablet Take 1,000 mcg by mouth every evening.     [provider]  vitamin E 100 UNIT capsule Take 100 Units by mouth every evening.     [provider]    Physical Exam: Vitals:   02/09/18 1630 02/09/18 1645 02/09/18 1930 02/09/18 2019  BP: 130/70 137/75 133/81 (!) 145/81  Pulse: (!) 50 (!) 54 Marland Kitchen)  45 (!) 56  Resp: 15 17 19 18   Temp:    (!) 97.5 F (36.4 C)  TempSrc:    Oral  SpO2: 95% 98% 99% 100%      Constitutional: NAD, calm, comfortable Vitals:   02/09/18 1630 02/09/18 1645 02/09/18 1930 02/09/18 2019  BP: 130/70 137/75 133/81 (!) 145/81  Pulse: (!) 50 (!) 54 (!) 45 (!) 56  Resp: 15 17 19 18   Temp:    (!) 97.5 F (36.4 C)  TempSrc:    Oral  SpO2: 95% 98% 99% 100%   Eyes: PERRL, lids and conjunctivae normal ENMT: Mucous membranes are moist. Posterior pharynx clear of any exudate or lesions.Normal dentition.  Neck: normal, supple, no masses, no thyromegaly Respiratory: clear to auscultation bilaterally, no wheezing, no crackles. Normal respiratory effort. No accessory muscle use.  Cardiovascular: Regular rate and rhythm, no murmurs / rubs / gallops. No extremity edema. 2+ pedal pulses. No carotid bruits.  Abdomen: no tenderness, no masses palpated. No hepatosplenomegaly. Bowel sounds positive.  Musculoskeletal: no clubbing / cyanosis. No joint deformity upper and lower extremities. Good ROM, no contractures. Normal muscle tone.  Skin: no rashes, lesions, ulcers. No induration Neurologic: CN 2-12 grossly intact. Sensation intact, DTR normal. Strength 5/5 in all 4.  Psychiatric: Normal judgment and insight. Alert and oriented x 3. Normal mood.     Labs on Admission: I have personally reviewed following labs and imaging studies  CBC: Recent Labs  Lab 02/04/18 1426 02/09/18 1014  WBC 6.6 6.0  HGB 12.7 14.4  HCT 39.9 46.3*  MCV 90.3 92.4  PLT 215 235   Basic Metabolic  Panel: Recent Labs  Lab 02/04/18 1426 02/09/18 1014  NA 141 142  K 4.1 4.7  CL 105 101  CO2 25 29  GLUCOSE 105* 125*  BUN 13 16  CREATININE 0.80 0.98  CALCIUM 9.5 10.5*   GFR: Estimated Creatinine Clearance: 44.5 mL/min (by C-G formula based on SCr of 0.98 mg/dL). Liver Function Tests: Recent Labs  Lab 02/04/18 1426  AST 26  ALT 22  ALKPHOS 54  BILITOT 0.9  PROT 7.0  ALBUMIN 3.8   No results for input(s): LIPASE, AMYLASE in the last 168 hours. No results for input(s): AMMONIA in the last 168 hours. Coagulation Profile: Recent Labs  Lab 02/09/18 1146  INR 0.97   Cardiac Enzymes: No results for input(s): CKTOTAL, CKMB, CKMBINDEX, TROPONINI in the last 168 hours. BNP (last 3 results) No results for input(s): PROBNP in the last 8760 hours. HbA1C: No results for input(s): HGBA1C in the last 72 hours. CBG: Recent Labs  Lab 02/04/18 1425 02/09/18 2025  GLUCAP 101* 81   Lipid Profile: No results for input(s): CHOL, HDL, LDLCALC, TRIG, CHOLHDL, LDLDIRECT in the last 72 hours. Thyroid Function Tests: No results for input(s): TSH, T4TOTAL, FREET4, T3FREE, THYROIDAB in the last 72 hours. Anemia Panel: No results for input(s): VITAMINB12, FOLATE, FERRITIN, TIBC, IRON, RETICCTPCT in the last 72 hours. Urine analysis:    Component Value Date/Time   COLORURINE YELLOW 02/09/2018 [place] 02/09/2018 1203   LABSPEC 1.006 02/09/2018 1203   PHURINE 7.0 02/09/2018 1203   GLUCOSEU NEGATIVE 02/09/2018 1203   HGBUR NEGATIVE 02/09/2018 [place] 02/09/2018 [place] 02/09/2018 1203   PROTEINUR NEGATIVE 02/09/2018 1203   UROBILINOGEN 0.2 11/27/2014 0311   NITRITE NEGATIVE 02/09/2018 1203   LEUKOCYTESUR NEGATIVE 02/09/2018 1203   Sepsis Labs: @LABRCNTIP (procalcitonin:4,lacticidven:4) )No results found for this or any  previous visit (from the past 240 hour(s)).   Radiological Exams on Admission: Ct Head Wo  Contrast  Result Date: 02/09/2018 CLINICAL DATA:  Right facial and arm pain beginning today. EXAM: CT HEAD WITHOUT CONTRAST TECHNIQUE: Contiguous axial images were obtained from the base of the skull through the vertex without intravenous contrast. COMPARISON:  Brain MRI 07/28/2014.  Head CT scan 05/27/2014. FINDINGS: Brain: No evidence of acute infarction, hemorrhage, hydrocephalus, extra-axial collection or mass lesion/mass effect. 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 Vascular: No hyperdense vessel or unexpected calcification. Skull: Intact.  No focal lesion. Sinuses/Orbits: Status post bilateral lens extraction. Otherwise negative. Other: None. IMPRESSION: No acute abnormality. Chronic microvascular ischemic change. Electronically Signed   By: Inge Rise M.D.   On: 02/09/2018 13:04    EKG: Independently reviewed.  It shows sinus rhythm with left anterior fascicular block and left atrial enlargement.  She also has some ST changes but appear to be present in previous EKG.  Assessment/Plan Principal Problem:   Angina at rest [facility]) Active Problems:   Shortness of breath   Cough   GERD (gastroesophageal reflux disease)   Chest pain due to coronary artery disease   Bradycardia     #1 angina: Appears to be unstable angina.  Patient was seen by neurology and ruled out TIA versus CVA.  Symptoms are thought to be angina equivalent that is persistent.  We will admit her and start her on IV heparin.  She she will continue on aspirin also statin.  Cardiology will see the patient in the morning with possible cardiac cath.  #2 COPD: Patient has shortness of breath probably secondary to COPD.  Monitor patient with nebulizer.  No exacerbation.  #3 GERD: Continue with PPIs.  #4 bradycardia: Patient had sinus bradycardia on arrival.  This has since resolved   #5 hyperlipidemia: Continue with pravastatin and  omega-3 fatty acids.  #6 hypertension: Patient on clonidine and hydralazine.  Also losartan.  She is also on Aldactone.  Continue treatment of defer care to cardiologist.  #7 possible migraine: Patient seen by neurology in the ER.  Recommendation is to initiate TCA for migraine prophylaxis.  Symptoms may be related to that.   DVT prophylaxis: Heparin drip Code Status: Full code Family Communication: Husband who is together with the patient Disposition Plan: Home Consults called: Cardiology Dr. Einar Gip to be called in the morning.  Dr. Billy Fischer to call in the ER. Neurology Dr. Leonel Ramsay also evaluated patient. Admission status: Inpatient  Severity of Illness: The appropriate patient status for this patient is INPATIENT. Inpatient status is judged to be reasonable and necessary in order to provide the required intensity of service to ensure the patient's safety. The patient's presenting symptoms, physical exam findings, and initial radiographic and laboratory data in the context of their chronic comorbidities is felt to place them at high risk for further clinical deterioration. Furthermore, it is not anticipated that the patient will be medically stable for discharge from the hospital within 2 midnights of admission. The following factors support the patient status of inpatient.   " The patient's presenting symptoms include left-sided chest pain facial numbness and left arm numbness. " The worrisome physical exam findings include no active physical findings. " The initial radiographic and laboratory data are worrisome because of no significant findings.. " The chronic co-morbidities include hypertension, diabetes and possible migraines.   * I certify that at the point of  admission it is my clinical judgment that the patient will require inpatient hospital care spanning beyond 2 midnights from the point of admission due to high intensity of service, high risk for further deterioration and  high frequency of surveillance required.Barbette Merino MD [facility] Pager [phone]  If 7PM-7AM, please contact night-coverage [email or web address] Password [facility]  02/09/2018, 8:37 PM

## 2018-02-09 NOTE — Progress Notes (Signed)
ANTICOAGULATION CONSULT NOTE - Initial Consult  Pharmacy Consult for heparin Indication: chest pain/ACS  Allergies  Allergen Reactions  . Amlodipine   . Naproxen     Patient preference   . Prednisone     Patient Preference     Patient Measurements:   Heparin Dosing Weight: 64kg  Vital Signs: Temp: 98.2 F (36.8 C) (09/02 1003) Temp Source: Oral (09/02 1003) BP: 137/75 (09/02 1645) Pulse Rate: 54 (09/02 1645)  Labs: Recent Labs    02/09/18 1014 02/09/18 1146  HGB 14.4  --   HCT 46.3*  --   PLT 242  --   APTT  --  27  LABPROT  --  12.8  INR  --  0.97  CREATININE 0.98  --     Estimated Creatinine Clearance: 44.5 mL/min (by C-G formula based on SCr of 0.98 mg/dL).   Medical History: Past Medical History:  Diagnosis Date  . Allergic rhinitis   . Asthma   . Carotid stenosis    ICA(L)  . Constipation   . Diabetes mellitus without complication ([place])   . GERD (gastroesophageal reflux disease)   . Hyperlipidemia   . Hypertension   . Neuropathy    face (R)  . OA (osteoarthritis)   . Osteoporosis   . Vitamin D deficiency      Assessment: 82 yoF admitted with multiple complaints including CP to be started on IV heparin for ACS r/o. Trops negative, CBC wnl, head CT negative for acute abnormalities. No OAC noted PTA by patient.  Goal of Therapy:  Heparin level 0.3-0.7 units/ml Monitor platelets by anticoagulation protocol: Yes   Plan:  -Heparin 4000 units x1 -Heparin 750 units/hr -Check 8-hr heparin level -Monitor heparin level, CBC, S/Sx bleeding daily  Arrie Senate, PharmD, BCPS Clinical Pharmacist [phone] Please check AMION for all [place] numbers 02/09/2018

## 2018-02-09 NOTE — ED Provider Notes (Signed)
[facility] Provider Note   CSN: [id number] Arrival date & time: 02/09/18  3875     History   Chief Complaint Chief Complaint  Patient presents with  . right face tingling, right arm tight    HPI Sara Brown is a 77 y.o. female with history of allergic rhinitis, asthma, diabetes mellitus, GERD, HLD, HTN, neuropathy, OA, and carotid stenosis presents for evaluation of intermittent right facial paresthesias and left tongue "feeling funny".  She is a poor historian and it is difficult to obtain specifics from the patient but from what I can gather she has been experiencing an intermittent "tightness "to the right side of her face radiating to the right upper extremity.  She was seen and evaluated in the ED for this 5 days ago which she declined at the time.  She states that since then she has experienced this sensation a few times.  She is unsure how long it lasts.  She also feels as though the left side of her tongue "feels funny" and she has decreased sensation of taste at the time.  She will sometimes experience a discomfort in the occipital region radiating down the posterior aspect of the neck.  It does not always go along with the right-sided symptoms.  She will sometimes feel unsteady in her gait when she experiences the symptoms.  She denies vision changes, nausea, vomiting, slurred speech, or facial droop.  She has been taking her home medicines without relief of her symptoms.  She states that she was admitted to the hospital several years ago for TIA work-up but does not remember the results.  She does note that she is due for a carotid ultrasound later this month.  She also notes intermittent left-sided chest pain since last night.  She describes the pain as sharp.  Improved with rest.  First episode lasted around 40 minutes and then she had a second episode this morning. No associated shortness of breath, nausea, vomiting, diaphoresis, or  lightheadedness.  The pain does not radiate.    The history is provided by the patient.    Past Medical History:  Diagnosis Date  . Allergic rhinitis   . Asthma   . Carotid stenosis    ICA(L)  . Constipation   . Diabetes mellitus without complication ([place])   . GERD (gastroesophageal reflux disease)   . Hyperlipidemia   . Hypertension   . Neuropathy    face (R)  . OA (osteoarthritis)   . Osteoporosis   . Vitamin D deficiency     Patient Active Problem List   Diagnosis Date Noted  . GERD (gastroesophageal reflux disease)   . Shortness of breath 07/30/2007  . COUGH 07/30/2007    Past Surgical History:  Procedure Laterality Date  . CESAREAN SECTION    . CESAREAN SECTION       OB History   None      Home Medications    Prior to Admission medications   Medication Sig Start Date End Date Taking? Authorizing Provider  acetaminophen (TYLENOL) 500 MG tablet Take 1,000 mg by mouth every 6 (six) hours as needed for mild pain.    [provider]  amLODipine (NORVASC) 10 MG tablet Take 5 mg by mouth daily.    [provider]  aspirin 81 MG tablet Take 81 mg by mouth daily.    [provider]  carboxymethylcellulose (REFRESH PLUS) 0.5 % SOLN Place 2 drops into both eyes 4 (four) times  daily as needed (dry eyes). As directed     [provider]  clobetasol cream (TEMOVATE) 0.05 % APPLY TO RASH WHILE SKIN IS WET ONCE DAILY. DO NOT APPLY TO FACE OR FOLDS. 11/16/15   [provider]  cloNIDine (CATAPRES) 0.1 MG tablet TAKE 1 TABLET BY MOUTH NIGHTLY AT BEDTIME 10/01/17   [provider]  cycloSPORINE (RESTASIS) 0.05 % ophthalmic emulsion Place 1 drop into both eyes every evening.     [provider]  Emollient (CERAVE EX) Apply 1 application topically 3 (three) times daily as needed (itching).    [provider]  fish oil-omega-3 fatty acids 1000 MG capsule Take 2 g by mouth every evening.     [provider]  fluticasone (FLONASE) 50 MCG/ACT nasal spray Place 2 sprays into both nostrils daily as needed for allergies.     [provider]  hydrALAZINE (APRESOLINE) 10 MG tablet hydralazine 10 mg tablet 05/22/17   [provider]  lansoprazole (PREVACID) 15 MG capsule Take 15 mg by mouth daily as needed (acid reflux).     [provider]  losartan (COZAAR) 25 MG tablet Take 25 mg by mouth every evening.     [provider]  magnesium 30 MG tablet Take 30 mg by mouth every evening.     [provider]  Multiple Vitamins-Minerals (MULTIVITAMIN WITH MINERALS) tablet Take 1 tablet by mouth every evening.     [provider]  Pitavastatin Calcium (LIVALO PO) Take 1 tablet by mouth daily.    [provider]  Polyethyl Glycol-Propyl Glycol (SYSTANE OP) Place 2 drops into both eyes 2 (two) times daily as needed (dry eyes).    [provider]  polyethylene glycol (MIRALAX / GLYCOLAX) packet Take 17 g by mouth daily as needed for mild constipation.    [provider]  pyridOXINE (VITAMIN B-6) 100 MG tablet Take 100 mg by mouth every evening.     [provider]  Soap & Cleansers (AQUANIL SKIN CLEANSER EX) Apply 1 application topically 2 (two) times daily.     [provider]  spironolactone (ALDACTONE) 50 MG tablet Take 50 mg by mouth 2 (two) times daily.    [provider]  triamcinolone cream (KENALOG) 0.1 % Apply 1 application topically 2 (two) times daily as needed (rash/ eczema). To affected area as needed to rash 11/01/14   [provider]  vitamin B-12 (CYANOCOBALAMIN) 1000 MCG tablet Take 1,000 mcg by mouth every evening.     [provider]  vitamin E 100 UNIT capsule Take 100 Units by mouth every evening.     [provider]    Family History Family History  Problem Relation Age of Onset  . Diabetes Mellitus II Mother   . Osteoporosis Mother   . Hypertension Mother     . Diabetes Mellitus II Father   . CVA Sister        4  . CAD Sister        4  . Hypertension Sister        4  . Osteoarthritis Sister        4  . Diabetes Mellitus II Sister        4  . Dementia Sister        decreased  . Alcoholism Brother     Social History Social History   Tobacco Use  . Smoking status: Never Smoker  . Smokeless tobacco: Never Used  Substance Use  Topics  . Alcohol use: No    Alcohol/week: 0.0 standard drinks  . Drug use: No     Allergies   Amlodipine; Naproxen; and Prednisone   Review of Systems Review of Systems  Constitutional: Negative for chills and fever.  Respiratory: Negative for shortness of breath.   Cardiovascular: Positive for chest pain.  Gastrointestinal: Negative for abdominal pain, nausea and vomiting.  Neurological: Positive for numbness and headaches. Negative for syncope and weakness.  All other systems reviewed and are negative.    Physical Exam Updated Vital Signs BP 128/75   Pulse (!) 50   Temp 98.2 F (36.8 C) (Oral)   Resp 13   SpO2 97%   Physical Exam  Constitutional: She is oriented to person, place, and time. She appears well-developed and well-nourished. No distress.  HENT:  Head: Normocephalic and atraumatic.  Eyes: Pupils are equal, round, and reactive to light. Conjunctivae and EOM are normal. Right eye exhibits no discharge. Left eye exhibits no discharge.  Neck: Normal range of motion. Neck supple. No JVD present. No tracheal deviation present.  Cardiovascular: Normal rate, regular rhythm, normal heart sounds and intact distal pulses.  2+ radial and DP/PT pulses bilaterally, Homans sign absent bilaterally, no lower extremity edema, no palpable cords, compartments are soft   Pulmonary/Chest: Effort normal and breath sounds normal. No stridor. No respiratory distress. She has no wheezes. She has no rales. She exhibits no tenderness.  Abdominal: Soft. Bowel sounds are normal. She exhibits no distension.  There is no tenderness. There is no guarding.  Musculoskeletal: She exhibits no edema.  Neurological: She is alert and oriented to person, place, and time. No cranial nerve deficit or sensory deficit. She exhibits normal muscle tone.  Mental Status:  Alert, thought content appropriate, able to give a coherent history. Speech fluent without evidence of aphasia. Able to follow 2 step commands without difficulty.  Cranial Nerves:  II:  Peripheral visual fields grossly normal, pupils equal, round, reactive to light III,IV, VI: ptosis not present, extra-ocular motions intact bilaterally  V,VII: smile symmetric, facial light touch sensation equal VIII: hearing grossly normal to voice  X: uvula elevates symmetrically  XI: bilateral shoulder shrug symmetric and strong XII: midline tongue extension without fassiculations Motor:  Normal tone. 5/5 strength of BUE and BLE major muscle groups including strong and equal grip strength and dorsiflexion/plantar flexion Sensory: light touch normal in all extremities. Cerebellar: normal finger-to-nose with bilateral upper extremities Gait: normal gait and balance. Able to walk on toes and heels with ease.  No pronator drift.  No nystagmus.  Romberg sign absent.   Skin: Skin is warm and dry. No erythema.  Psychiatric: She has a normal mood and affect. Her behavior is normal.  Nursing note and vitals reviewed.    ED Treatments / Results  Labs (all labs ordered are listed, but only abnormal results are displayed) Labs Reviewed  BASIC METABOLIC PANEL - Abnormal; Notable for the following components:      Result Value   Glucose, Bld 125 (*)    Calcium 10.5 (*)    GFR calc non Af Amer 53 (*)    All other components within normal limits  CBC - Abnormal; Notable for the following components:   HCT 46.3 (*)    All other components within normal limits  ETHANOL  PROTIME-INR  APTT  URINALYSIS, ROUTINE W REFLEX MICROSCOPIC  I-STAT TROPONIN, ED  I-STAT  TROPONIN, ED    EKG EKG Interpretation  Date/Time:  Monday February 09 2018 10:18:53 EDT Ventricular Rate:  67 PR Interval:  160 QRS Duration: 78 QT Interval:  382 QTC Calculation: 403 R Axis:   -45 Text Interpretation:  Normal sinus rhythm Possible Left atrial enlargement Left anterior fascicular block Anterior infarct , age undetermined Abnormal ECG TW inversion aVL similar to prior ECG (Aug 2000) Confirmed by Gareth Morgan [phone]) on 02/09/2018 12:23:34 PM Also confirmed by Gareth Morgan [phone])  on 02/09/2018 4:45:30 PM   Radiology Ct Head Wo Contrast  Result Date: 02/09/2018 CLINICAL DATA:  Right facial and arm pain beginning today. EXAM: CT HEAD WITHOUT CONTRAST TECHNIQUE: Contiguous axial images were obtained from the base of the skull through the vertex without intravenous contrast. COMPARISON:  Brain MRI 07/28/2014.  Head CT scan 05/27/2014. FINDINGS: Brain: No evidence of acute infarction, hemorrhage, hydrocephalus, extra-axial collection or mass lesion/mass effect. 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 Vascular: No hyperdense vessel or unexpected calcification. Skull: Intact.  No focal lesion. Sinuses/Orbits: Status post bilateral lens extraction. Otherwise negative. Other: None. IMPRESSION: No acute abnormality. Chronic microvascular ischemic change. Electronically Signed   By: Inge Rise M.D.   On: 02/09/2018 13:04    Procedures Procedures (including critical care time)  Medications Ordered in ED Medications - No data to display   Initial Impression / Assessment and Plan / ED Course  I have reviewed the triage vital signs and the nursing notes.  Pertinent labs & imaging results that were available during my care of the patient were reviewed by me and considered in my medical decision making (see chart for details).     Patient presents with complaint of intermittent  tingling and tightness sensation of the right side of the face with radiation to the right upper extremity and abnormal sensation of the left tongue as well as intermittent left-sided sharp stabbing chest pain since last night.  Seen for her neurologic symptoms 5 days ago, refused MRI at this time.  She is afebrile, vital signs are stable.  She is nontoxic in appearance.  No focal neurologic deficits on examination.  CT of the head shows no acute abnormalities but does show chronic microvascular ischemic changes.  Lab work shows no leukocytosis, no anemia, no metabolic derangements.  EKG shows T wave inversions which have been seen on prior EKG but otherwise no ischemic changes.  Serial troponins are negative.  4:02 PM Spoke with Dr. 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  Patient told him that her symptoms have been ongoing for years and occur at least once a month if not more frequently.  She has also had a TIA work-up 10 years ago.  He does not recommend MRI at this time.  He has offered the patient nortriptyline nightly for migraine prophylaxis which she has declined.  He does recommend follow-up with outpatient neurology if the patient wishes.  5:33 PM Spoke with Dr. Virgina Jock cardiologist with Dr. Einar Gip who states that patient had negative stress in May.  He does not strongly feel that her symptoms are suggestive of acute MI however he states that if the patient feels significantly concerned he feels it is reasonable for medical admission overnight and he will perform cardiac catheterization in the morning.  5:49 PM Signed out to oncoming provider Prokesova MD. patient to be admitted to the [facility].  Final Clinical Impressions(s) / ED Diagnoses   Final diagnoses:  Atypical chest pain  Tingling of face    ED Discharge Orders    None         Renita Papa, PA-C 02/09/18 Romualdo Bolk, MD 02/11/18 1406

## 2018-02-10 DIAGNOSIS — R0789 Other chest pain: Secondary | ICD-10-CM | POA: Diagnosis not present

## 2018-02-10 DIAGNOSIS — R001 Bradycardia, unspecified: Secondary | ICD-10-CM | POA: Diagnosis not present

## 2018-02-10 DIAGNOSIS — R079 Chest pain, unspecified: Secondary | ICD-10-CM | POA: Diagnosis present

## 2018-02-10 DIAGNOSIS — I208 Other forms of angina pectoris: Secondary | ICD-10-CM | POA: Diagnosis not present

## 2018-02-10 DIAGNOSIS — I10 Essential (primary) hypertension: Secondary | ICD-10-CM | POA: Diagnosis not present

## 2018-02-10 LAB — GLUCOSE, CAPILLARY
GLUCOSE-CAPILLARY: 110 mg/dL — AB (ref 70–99)
GLUCOSE-CAPILLARY: 100 mg/dL — AB (ref 70–99)
Glucose-Capillary: 77 mg/dL (ref 70–99)

## 2018-02-10 LAB — CBC
HCT: 38.5 % (ref 36.0–46.0)
Hemoglobin: 12.3 g/dL (ref 12.0–15.0)
MCH: 28.6 pg (ref 26.0–34.0)
MCHC: 31.9 g/dL (ref 30.0–36.0)
MCV: 89.5 fL (ref 78.0–100.0)
Platelets: 215 10*3/uL (ref 150–400)
RBC: 4.3 MIL/uL (ref 3.87–5.11)
RDW: 13.3 % (ref 11.5–15.5)
WBC: 5.1 10*3/uL (ref 4.0–10.5)

## 2018-02-10 LAB — HEPARIN LEVEL (UNFRACTIONATED): Heparin Unfractionated: 0.35 IU/mL (ref 0.30–0.70)

## 2018-02-10 LAB — TROPONIN I: Troponin I: <0.03 ng/mL (ref <0.03)

## 2018-02-10 NOTE — Progress Notes (Signed)
PROGRESS NOTE    Sara Brown  KGM:[id number] DOB: April 24, 1941 DOA: 02/09/2018 PCP: Shon Baton, MD    Brief Narrative:  77 year old female who presented left arm and jaw pain associated with paresthesias and weakness.  He does have significant past medical history for asthma, hypertension, type 2 diabetes mellitus and dyslipidemia.  Reported intermittent left-sided chest pain, sharp in nature, moderate in intensity, no radiation, lasted for 30 minutes. Nonexertional related.  Initial physical examination blood pressure 130/70, heart rate 50, respiratory rate 15, temperature 97.5, oxygen saturation 100%. Lungs clear to auscultation bilaterally, heart S1-S2 present rhythmic, abdomen soft nontender, no lower extremity edema.  Sodium 142, potassium 4.7, chloride 101, bicarb 29, glucose 125, BUN 16, creatinine 0.98, troponin I less than 0.03, white count 6.0, hemoglobin 14.4, hematocrit 46.3, platelets 242, urinalysis negative for infection, head CT negative.  Sinus rhythm, left axis deviation, normal intervals, no significant ST segment or T wave changes.  Patient was admitted to the hospital with working diagnosis of atypical chest pain, rule out acute coronary syndrome.  Assessment & Plan:   Principal Problem:   Angina at rest [facility]) Active Problems:   Shortness of breath   Cough   GERD (gastroesophageal reflux disease)   Chest pain due to coronary artery disease   Bradycardia   1. Atypical chest pain. Cardiac enzymes have been negative x3, ekg with no ischemic changes. No further chest pain. Patient will ger coronary CT angiography for further workup per cardiology recommendations. Note exercise nuclear stress test was negative on 10/2017. Continue aspirin and statin.   2. HTN. Will continue blood pressure control with clonidine, losartan, hydralazine and spironolactone.   3. T2DM. At home on diet control, admission glucose 125, will continue fasting glucose check in am.   4. Asthma.  Stable with no signs of exacerbation.   5. Dyslipidemia. Continue statin therapy.    DVT prophylaxis: enoxaparin   Code Status:  full Family Communication: I spoke with patient's husband at the bedside and all questions were addressed.  Disposition Plan/ discharge barriers: pending CT angiography report   Consultants:   Cardiology   Procedures:     Antimicrobials:       Subjective: Patient has remained chest pain free, no nausea or vomiting, no dyspnea or palpitations. Patient is physically active at home, only limited by knee pain, able to do yard work on regular bases.   Objective: Vitals:   02/10/18 0318 02/10/18 0505 02/10/18 0852 02/10/18 1024  BP:  105/73 (!) 112/58 (P) 138/74  Pulse:  (!) 56 (!) 47   Resp:  18 18   Temp:  98.2 F (36.8 C) 98.8 F (37.1 C)   TempSrc:  Oral Oral   SpO2:  97% 98%   Weight: 64.3 kg     Height:        Intake/Output Summary (Last 24 hours) at 02/10/2018 1049 Last data filed at 02/10/2018 1024 Gross per 24 hour  Intake 378.28 ml  Output 600 ml  Net -221.72 ml   Filed Weights   02/09/18 2226 02/10/18 0318  Weight: 64 kg 64.3 kg    Examination:   General: not in pain or dyspnea  Neurology: Awake and alert, non focal  E ENT: no pallor, no icterus, oral mucosa moist Cardiovascular: No JVD. S1-S2 present, rhythmic, no gallops, rubs, or murmurs. No lower extremity edema. Pulmonary: vesicular breath sounds bilaterally, adequate air movement, no wheezing, rhonchi or rales. Gastrointestinal. Abdomen with no organomegaly, non tender, no rebound or guarding  Skin. No rashes Musculoskeletal: no joint deformities     Data Reviewed: I have personally reviewed following labs and imaging studies  CBC: Recent Labs  Lab 02/04/18 1426 02/09/18 1014 02/10/18 0434  WBC 6.6 6.0 5.1  HGB 12.7 14.4 12.3  HCT 39.9 46.3* 38.5  MCV 90.3 92.4 89.5  PLT 215 242 459   Basic Metabolic Panel: Recent Labs  Lab 02/04/18 1426  02/09/18 1014  NA 141 142  K 4.1 4.7  CL 105 101  CO2 25 29  GLUCOSE 105* 125*  BUN 13 16  CREATININE 0.80 0.98  CALCIUM 9.5 10.5*   GFR: Estimated Creatinine Clearance: 43.6 mL/min (by C-G formula based on SCr of 0.98 mg/dL). Liver Function Tests: Recent Labs  Lab 02/04/18 1426  AST 26  ALT 22  ALKPHOS 54  BILITOT 0.9  PROT 7.0  ALBUMIN 3.8   No results for input(s): LIPASE, AMYLASE in the last 168 hours. No results for input(s): AMMONIA in the last 168 hours. Coagulation Profile: Recent Labs  Lab 02/09/18 1146  INR 0.97   Cardiac Enzymes: Recent Labs  Lab 02/09/18 2123 02/10/18 0019 02/10/18 0434  TROPONINI <0.03 <0.03 <0.03   BNP (last 3 results) No results for input(s): PROBNP in the last 8760 hours. HbA1C: No results for input(s): HGBA1C in the last 72 hours. CBG: Recent Labs  Lab 02/04/18 1425 02/09/18 2025 02/10/18 0754  GLUCAP 101* 81 77   Lipid Profile: No results for input(s): CHOL, HDL, LDLCALC, TRIG, CHOLHDL, LDLDIRECT in the last 72 hours. Thyroid Function Tests: No results for input(s): TSH, T4TOTAL, FREET4, T3FREE, THYROIDAB in the last 72 hours. Anemia Panel: No results for input(s): VITAMINB12, FOLATE, FERRITIN, TIBC, IRON, RETICCTPCT in the last 72 hours.    Radiology Studies: I have reviewed all of the imaging during this hospital visit personally     Scheduled Meds: . aspirin EC  81 mg Oral Daily  . cloNIDine  0.1 mg Oral QHS  . cycloSPORINE  1 drop Both Eyes QPM  . hydrALAZINE  10 mg Oral Q8H  . losartan  25 mg Oral QPM  . multivitamin with minerals  1 tablet Oral QPM  . omega-3 acid ethyl esters  1 g Oral QPM  . pantoprazole  20 mg Oral Daily  . pravastatin  20 mg Oral q1800  . pyridOXINE  100 mg Oral QPM  . spironolactone  50 mg Oral BID  . vitamin B-12  1,000 mcg Oral QPM  . vitamin E  100 Units Oral QPM   Continuous Infusions:   LOS: 1 day        Mauricio Gerome Apley, MD [facility] Pager  [phone]

## 2018-02-10 NOTE — Progress Notes (Signed)
   02/09/18 2358  Vitals  Temp 98.8 F (37.1 C)  Temp Source Oral  BP 131/71  MAP (mmHg) 88  BP Method Automatic  Patient Position (if appropriate) Lying  Pulse Rate (!) 54  Pulse Rate Source Monitor  Resp 18  Oxygen Therapy  SpO2 96 %  O2 Device Room Air  Admitted pt to rm 3E02 from ED, pt alert and oriented, denied pain at this time, oriented to room, call bell placed within reach.

## 2018-02-10 NOTE — Progress Notes (Signed)
Atypical chest pain. Not ACS. Heparin stopped. Prior negative nuclear stress test in 10/2017, makes obstructive CAD less likely. However, patient is very concerned would like to proceed with definitive coronary anatomy evaluation. I discussed invasive coronary angiography vs CTA coronary. After discussing with her family, patient opted for CTA. Order placed.  Nigel Mormon, MD [facility] Pager: [phone] Office: [phone] If no answer Cell [phone]

## 2018-02-10 NOTE — Progress Notes (Signed)
MD notified RN to call and update patients decision. Patient wants to do coronary CT (not cardiac cath), [facility] in cath lab notified. MD aware. Verbal orders to stop heparin.

## 2018-02-10 NOTE — Plan of Care (Signed)

## 2018-02-10 NOTE — Progress Notes (Signed)
   02/10/18 1000  Clinical Encounter Type  Visited With Patient  Visit Type Initial  Referral From Nurse  Consult/Referral To Chaplain  Spiritual Encounters  Spiritual Needs Prayer  Stress Factors  Patient Stress Factors None identified  Chaplain met with Nurses station for PT spiritual concerns. PT was alert, happy and elated to see Chaplain. PT was thankful and open to share her life history.

## 2018-02-10 NOTE — Consult Note (Signed)
Reason for Consult: Chest pain Referring Physician: [facility]  Sara Brown is an 77 y.o. female.  HPI:   77 y/o [place] American female with hypertension, hyperlipidemia, h/o TIA, diet controlled DM, admitted to the hospital with left sided face tingling, numbness and left sided chest pain. TIA/CVA was excluded by neurology based on lack of clinical or imaging evidence. 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 Patient also has been having chest pain epidoes that are left sided, lasting for up to 40 min, sometimes worse with exertion and relieved by rest. Patient has had a negative nuclear stress test in 10/2017. Trop and EKG negative for ischemia.   Past Medical History:  Diagnosis Date  . Allergic rhinitis   . Arthritis    "fingers, knees" (02/09/2018)  . Asthma    "stopped taking RX after dr said I don't have this" (02/09/2018)  . Carotid stenosis    ICA(L)  . Complication of anesthesia    "felt the cut w/one of my c-sections" (02/09/2018)  . Constipation   . GERD (gastroesophageal reflux disease)   . Headache    "years ago; before menopause" (02/09/2018)  . Hyperlipidemia   . Hypertension   . Neuropathy    face (R)  . OA (osteoarthritis)   . Osteoporosis   . TIA (transient ischemic attack) ?2005  . Type II diabetes mellitus ([place])   . Vitamin D deficiency     Past Surgical History:  Procedure Laterality Date  . CATARACT EXTRACTION W/ INTRAOCULAR LENS  IMPLANT, BILATERAL Bilateral   . CESAREAN SECTION  1971; 1975  . TUBAL LIGATION  1975    Family History  Problem Relation Age of Onset  . Diabetes Mellitus II Mother   . Osteoporosis Mother   . Hypertension Mother   . Diabetes Mellitus II Father   . CVA Sister        4  . CAD Sister        4  . Hypertension Sister        4  . Osteoarthritis Sister        4  . Diabetes Mellitus II Sister        4  . Dementia Sister        decreased  . Alcoholism Brother     Social History:   reports that she has never smoked. She has never used smokeless tobacco. She reports that she does not drink alcohol or use drugs.  Allergies:  Allergies  Allergen Reactions  . Amlodipine Hives  . Naproxen Other (See Comments)    Patient preference   . Prednisone Other (See Comments)    Patient Preference   . Losartan Rash    Medications: I have reviewed the patient's current medications.  Results for orders placed or performed during the hospital encounter of 02/09/18 (from the past 48 hour(s))  Basic metabolic panel     Status: Abnormal   Collection Time: 02/09/18 10:14 AM  Result Value Ref Range   Sodium 142 135 - 145 mmol/L   Potassium 4.7 3.5 - 5.1 mmol/L   Chloride 101 98 - 111 mmol/L   CO2 29 22 - 32 mmol/L   Glucose, Bld 125 (H) 70 - 99 mg/dL   BUN 16 8 - 23 mg/dL   Creatinine, Ser 0.98 0.44 - 1.00 mg/dL   Calcium 10.5 (H) 8.9 - 10.3 mg/dL   GFR calc non Af Amer 53 (L) >60 mL/min   GFR calc  Af Amer >60 >60 mL/min    Comment: (NOTE) The eGFR has been calculated using the CKD EPI equation. This calculation has not been validated in all clinical situations. eGFR's persistently <60 mL/min signify possible Chronic Kidney Disease.    Anion gap 12 5 - 15    Comment: Performed at [facility] [address] [place], [place] [address]  CBC     Status: Abnormal   Collection Time: 02/09/18 10:14 AM  Result Value Ref Range   WBC 6.0 4.0 - 10.5 K/uL   RBC 5.01 3.87 - 5.11 MIL/uL   Hemoglobin 14.4 12.0 - 15.0 g/dL   HCT 46.3 (H) 36.0 - 46.0 %   MCV 92.4 78.0 - 100.0 fL   MCH 28.7 26.0 - 34.0 pg   MCHC 31.1 30.0 - 36.0 g/dL   RDW 13.5 11.5 - 15.5 %   Platelets 242 150 - 400 K/uL    Comment: Performed at [facility], [place] [address] [place], [place] [address]  Protime-INR     Status: None   Collection Time: 02/09/18 11:46 AM  Result Value Ref Range   Prothrombin Time 12.8 11.4 - 15.2 seconds   INR 0.97     Comment: Performed at [facility] [address] [place], [place] [address]  APTT     Status: None   Collection Time: 02/09/18 11:46 AM  Result Value Ref Range   aPTT 27 24 - 36 seconds    Comment: Performed at [facility] [address] [place], [place] [address]  Ethanol     Status: None   Collection Time: 02/09/18 12:03 PM  Result Value Ref Range   Alcohol, Ethyl (B) <10 <10 mg/dL    Comment: (NOTE) Lowest detectable limit for serum alcohol is 10 mg/dL. For medical purposes only. Performed at [facility], [place] [address], [place], [place] [address]   Urinalysis, Routine w reflex microscopic     Status: None   Collection Time: 02/09/18 12:03 PM  Result Value Ref Range   Color, Urine YELLOW YELLOW   APPearance CLEAR CLEAR   Specific Gravity, Urine 1.006 1.005 - 1.030   pH 7.0 5.0 - 8.0   Glucose, UA NEGATIVE NEGATIVE mg/dL   Hgb urine dipstick NEGATIVE NEGATIVE   Bilirubin Urine NEGATIVE NEGATIVE   Ketones, ur NEGATIVE NEGATIVE mg/dL   Protein, ur NEGATIVE NEGATIVE mg/dL   Nitrite NEGATIVE NEGATIVE   Leukocytes, UA NEGATIVE NEGATIVE    Comment: Performed at [facility] [address], [place], [place] [address]  I-stat troponin, ED     Status: None   Collection Time: 02/09/18 12:06 PM  Result Value Ref Range   Troponin i, poc 0.00 0.00 - 0.08 ng/mL   Comment 3            Comment: Due to the release kinetics of cTnI, a negative result within the first hours of the onset of symptoms does not rule out myocardial infarction with certainty. If myocardial infarction is still suspected, repeat the test at appropriate intervals.   I-Stat Troponin, ED (not at [facility])     Status: None   Collection Time: 02/09/18  5:18 PM  Result Value Ref Range   Troponin i, poc 0.02 0.00 - 0.08 ng/mL   Comment 3            Comment: Due to the release kinetics of cTnI, a negative result within the first hours of  the onset of symptoms does not rule out myocardial infarction with certainty. If myocardial  infarction is still suspected, repeat the test at appropriate intervals.   Glucose, capillary     Status: None   Collection Time: 02/09/18  8:25 PM  Result Value Ref Range   Glucose-Capillary 81 70 - 99 mg/dL   Comment 1 Notify RN    Comment 2 Document in Chart   Troponin I-serum (0, 3, 6 hours)     Status: None   Collection Time: 02/09/18  9:23 PM  Result Value Ref Range   Troponin I <0.03 <0.03 ng/mL    Comment: Performed at [facility] [address] [place], [name] [address]  Troponin I-serum (0, 3, 6 hours)     Status: None   Collection Time: 02/10/18 12:19 AM  Result Value Ref Range   Troponin I <0.03 <0.03 ng/mL    Comment: Performed at [facility] [address], [place], [name] [address]  CBC     Status: None   Collection Time: 02/10/18  4:34 AM  Result Value Ref Range   WBC 5.1 4.0 - 10.5 K/uL   RBC 4.30 3.87 - 5.11 MIL/uL   Hemoglobin 12.3 12.0 - 15.0 g/dL   HCT 38.5 36.0 - 46.0 %   MCV 89.5 78.0 - 100.0 fL   MCH 28.6 26.0 - 34.0 pg   MCHC 31.9 30.0 - 36.0 g/dL   RDW 13.3 11.5 - 15.5 %   Platelets 215 150 - 400 K/uL    Comment: Performed at [facility], [address], [place], [name] [address]  Troponin I-serum (0, 3, 6 hours)     Status: None   Collection Time: 02/10/18  4:34 AM  Result Value Ref Range   Troponin I <0.03 <0.03 ng/mL    Comment: Performed at [facility] [address], [place], [name] [address]  Heparin level (unfractionated)     Status: None   Collection Time: 02/10/18  5:58 AM  Result Value Ref Range   Heparin Unfractionated 0.35 0.30 - 0.70 IU/mL    Comment: (NOTE) If heparin results are below expected values, and patient dosage has  been confirmed, suggest follow up testing of antithrombin III levels. Performed at [facility], [place] [address], [place], [name] [address]   Glucose, capillary     Status: None   Collection Time: 02/10/18  7:54 AM  Result Value Ref Range   Glucose-Capillary 77 70  - 99 mg/dL  Glucose, capillary     Status: Abnormal   Collection Time: 02/10/18 11:42 AM  Result Value Ref Range   Glucose-Capillary 110 (H) 70 - 99 mg/dL  Glucose, capillary     Status: Abnormal   Collection Time: 02/10/18  4:35 PM  Result Value Ref Range   Glucose-Capillary 100 (H) 70 - 99 mg/dL    Ct Head Wo Contrast  Result Date: 02/09/2018 CLINICAL DATA:  Right facial and arm pain beginning today. EXAM: CT HEAD WITHOUT CONTRAST TECHNIQUE: Contiguous axial images were obtained from the base of the skull through the vertex without intravenous contrast. COMPARISON:  Brain MRI 07/28/2014.  Head CT scan 05/27/2014. FINDINGS: Brain: No evidence of acute infarction, hemorrhage, hydrocephalus, extra-axial collection or mass lesion/mass effect. 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 Vascular: No hyperdense vessel or unexpected calcification. Skull: Intact.  No focal lesion. Sinuses/Orbits: Status post  bilateral lens extraction. Otherwise negative. Other: None. IMPRESSION: No acute abnormality. Chronic microvascular ischemic change. Electronically Signed   By: Inge Rise M.D.   On: 02/09/2018 13:04   Cardiac studies: EKG 02/09/2018: Sinus rhythm 67 bpm. Left axis deviation. Poor R wave progression. No ischemic changes.  Exercise myoview stress 10/20/2017: 1. The resting electrocardiogram demonstrated normal sinus rhythm, normal resting conduction and no resting arrhythmias.  The stress electrocardiogram was normal.  Patient exercised on Bruce protocol for 7:43 minutes and achieved 8.54 METS. Stress test terminated due to mid sternal chest pain and 90% % MPHR achieved (Target HR >85%). Normal BP. 2.  Myocardial perfusion imaging is normal. Overall left ventricular systolic function was normal without regional wall motion abnormalities. The left ventricular ejection fraction was 76%.  This is a low risk  study.  Echocardiogram 10/29/2017: Left ventricle cavity is normal in size. Mild concentric hypertrophy of the left ventricle. Normal global wall motion. Normal diastolic filling pattern. Calculated EF 69%. No significant valvular abnormality.  Insignificant pericardial effusion.  Review of Systems  Constitutional: Negative.   HENT: Negative.   Respiratory: Negative for shortness of breath.   Cardiovascular: Positive for chest pain. Negative for palpitations and claudication.  Gastrointestinal: Negative.   Genitourinary: Negative.   Musculoskeletal: Negative.   Skin: Negative.   Neurological: Positive for tingling. Negative for loss of consciousness.  All other systems reviewed and are negative.  Blood pressure 114/69, pulse (!) 51, temperature 97.9 F (36.6 C), temperature source Oral, resp. rate 18, height '5\' 6"'  (1.676 m), weight 64.3 kg, SpO2 98 %. Physical Exam  Nursing note and vitals reviewed. Constitutional: She is oriented to person, place, and time. She appears well-developed and well-nourished. No distress.  HENT:  Head: Normocephalic and atraumatic.  Neck: Normal range of motion. Neck supple. No JVD present.  Cardiovascular: Normal rate and normal heart sounds.  No murmur heard. Respiratory: Effort normal and breath sounds normal. She has no wheezes. She has no rales.  GI: Soft. Bowel sounds are normal. There is no tenderness.  Lymphadenopathy:    She has no cervical adenopathy.  Neurological: She is alert and oriented to person, place, and time. No cranial nerve deficit.  Skin: Skin is warm and dry.    Assessment/Recommendations:  77 y/o [place] American female with hypertension, hyperlipidemia, h/o TIA, diet controlled DM, admitted to the hospital with left sided face tingling, numbness and left sided chest pain.  Atypical chest pain. Not ACS. Heparin stopped. Prior negative nuclear stress test in 10/2017, makes obstructive CAD less likely. However, patient is  very concerned would like to proceed with definitive coronary anatomy evaluation. I discussed invasive coronary angiography vs CTA coronary. After discussing with her family, patient opted for CTA. Order placed.   Tyrisha Benninger J Cobe Viney 02/10/2018, 9:39 PM   [place], MD [facility] [place] Pager: [phone] Office: [phone] If no answer Cell [phone]

## 2018-02-11 DIAGNOSIS — R202 Paresthesia of skin: Secondary | ICD-10-CM

## 2018-02-11 DIAGNOSIS — R0789 Other chest pain: Secondary | ICD-10-CM | POA: Diagnosis not present

## 2018-02-11 LAB — LIPID PANEL
CHOL/HDL RATIO: 3.2 ratio
CHOLESTEROL: 186 mg/dL (ref 0–200)
HDL: 58 mg/dL (ref >40)
LDL Cholesterol: 114 mg/dL — ABNORMAL HIGH (ref 0–99)
TRIGLYCERIDES: 71 mg/dL (ref <150)
VLDL: 14 mg/dL (ref 0–40)

## 2018-02-11 MED ORDER — IOPAMIDOL (ISOVUE-370) INJECTION 76%: 100.0000 mL | Freq: Once | INTRAVENOUS | Status: DC

## 2018-02-11 MED ORDER — POLYETHYLENE GLYCOL 3350 17 G PO PACK
17.0000 g | PACK | Freq: Every day | ORAL | Status: DC
  Administered 2018-02-11: 17 g via ORAL
  Filled 2018-02-11: qty 1

## 2018-02-11 NOTE — Discharge Summary (Signed)
Physician Discharge Summary  Sara Brown VCB:[id number] DOB: 10/11/1940  PCP: Shon Baton, MD  Admit date: 02/09/2018 Discharge date: 02/11/2018  Recommendations for Outpatient Follow-up:  1. Dr. Shon Baton, PCP in 1 week with repeat labs (BMP). 2. Consider outpatient Neurology consultation and follow-up. 3. Dr. Vernell Leep, Cardiology on 02/12/2018.   Home Health: None Equipment/Devices: None   Discharge Condition: Improved and stable. CODE STATUS: Full Diet recommendation: Heart Healthy diet.  Discharge Diagnoses:  Principal Problem:   Angina at rest [facility]) Active Problems:   Shortness of breath   Cough   GERD (gastroesophageal reflux disease)   Chest pain due to coronary artery disease   Bradycardia   Chest pain   Brief Summary: 77 year old female with PMH of asthma, left ICA stenosis, constipation, diet controlled DM 2, GERD, HLD, HTN, right facial neuropathy, initially presented to the ED with complaints of left-sided facial numbness and tingling and left-sided chest pain.  She had been seen in the ED on 8/28 for complaints of right shoulder and neck area pain, right arm feeling funny and face feeling strange.  Work-up including CT or MRI was recommended but patient declined and wished to follow-up with outpatient neurology and patient was discharged home.  Neurology and cardiology were consulted during this hospitalization.  Assessment and plan:  Atypical chest pain: Cardiology was consulted and assisted with management.  She reported chest pain episodes lasting for up to 40 minutes, sometimes worse with exertion and relieved by rest.  She had undergone negative nuclear stress test in 10/2017 and hence obstructive CAD was felt less likely.  Troponins and EKG were negative for ischemia.  This was not ACS and hence heparin infusion but was briefly started was stopped.  However patient was very concerned and wished to proceed with definitive coronary anatomy evaluation.   Cardiology discussed invasive coronary angiography versus CTA coronaries.  Patient opted for CTA.  As per my discussion with Cardiology on day of discharge, due to technical and logistic difficulties, CTA coronary could not be performed inpatient. Dr. Virgina Jock recommended patient be discharged home and he arranged close outpatient follow-up next day after discharge to pursue further work-up.  This was discussed extensively with patient who verbalized understanding.  Paresthesias: Neurology was consulted.  Please see their note for details.  She described to them paresthesias (strange sensation like tingling) of her face and tongue which migrate over the course of minutes and sometimes cross midline.  She denied headaches with these episodes but did report history of migraines when she was younger.  She reported getting the symptoms  weekly and has for years and hence neurology felt that a sinister etiology was very unlikely.  The description was not consistent with TIA.  Neurology suggested that treating this is migraine aura with low-dose nortriptyline (example 12.5 mg q hs) would be an option.  This medication was not initiated in the hospital and is deferred to outpatient PCP of Neurology follow-up.  Essential hypertension: Uncontrolled.  She was continued on her prior home medications.  I discussed with Cardiologist who assured that he will follow up the next day in the office and make necessary adjustments as needed.  Patient reported allergies to losartan (rash) and amlodipine.  Diet controlled DM 2: No recent A1c in system, may consider doing as outpatient if none done recently.  Asthma: No clinical exacerbation.  Hyperlipidemia: LDL 114.  Continue statins.  Medications may need adjustment during outpatient follow-up with cardiology.  GERD: Continue H2 blockers.  Consultations:  Neurology  Cardiology  Procedures:  None   Discharge Instructions  Discharge Instructions    Call MD  for:   Complete by:  As directed    Strokelike symptoms.   Call MD for:  severe uncontrolled pain   Complete by:  As directed    Diet - low sodium heart healthy   Complete by:  As directed    Increase activity slowly   Complete by:  As directed        Medication List    TAKE these medications   aspirin EC 81 MG tablet Take 81 mg by mouth daily.   carboxymethylcellulose 0.5 % Soln Commonly known as:  REFRESH PLUS Place 1-2 drops into both eyes 3 (three) times daily. As directed   CERAVE EX Apply 1 application topically daily.   cloNIDine 0.1 MG tablet Commonly known as:  CATAPRES Take 0.1 mg by mouth at bedtime.   cycloSPORINE 0.05 % ophthalmic emulsion Commonly known as:  RESTASIS Place 1 drop into both eyes 2 (two) times daily.   fluticasone 50 MCG/ACT nasal spray Commonly known as:  FLONASE Place 1 spray into both nostrils at bedtime as needed for allergies or rhinitis.   hydrALAZINE 10 MG tablet Commonly known as:  APRESOLINE Take 10-20 mg by mouth 4 (four) times daily as needed (SBP >150).   KRILL OIL PO Take 1 capsule by mouth daily.   LIVALO 1 MG Tabs Generic drug:  Pitavastatin Calcium Take 1 mg by mouth daily.   multivitamin with minerals tablet Take 1 tablet by mouth daily. Centrum for Women   polyethylene glycol packet Commonly known as:  MIRALAX / GLYCOLAX Take 17 g by mouth at bedtime.   pyridOXINE 100 MG tablet Commonly known as:  VITAMIN B-6 Take 100 mg by mouth daily.   spironolactone 50 MG tablet Commonly known as:  ALDACTONE Take 50 mg by mouth daily.   triamcinolone cream 0.1 % Commonly known as:  KENALOG Apply 1 application topically 2 (two) times daily as needed (rash/ eczema).   vitamin B-12 1000 MCG tablet Commonly known as:  CYANOCOBALAMIN Take 1,000 mcg by mouth daily.   VITAMIN D3 PO Take 2 tablets by mouth daily.   vitamin E 100 UNIT capsule Take 100 Units by mouth daily.   ZANTAC PO Take 1 tablet by mouth daily  as needed (acid reflux/heartburn).      Follow-up Information    Nigel Mormon, MD Follow up on 02/12/2018.   Specialty:  Cardiology Why:  Please follow up at the time provided by the office. Contact information: [place] [address] [phone]        Shon Baton, MD. Schedule an appointment as soon as possible for a visit in 1 week.   Specialty:  Internal Medicine Why:  To be seen with repeat labs (BMP).  Please consider outpatient Neurology consultation and follow-up. Contact information: [address]  [place] [address] [phone]          Allergies  Allergen Reactions  . Amlodipine Hives  . Naproxen Other (See Comments)    Patient preference   . Prednisone Other (See Comments)    Patient Preference   . Losartan Rash      Procedures/Studies: Ct Head Wo Contrast  Result Date: 02/09/2018 CLINICAL DATA:  Right facial and arm pain beginning today. EXAM: CT HEAD WITHOUT CONTRAST TECHNIQUE: Contiguous axial images were obtained from the base of the skull through the vertex without intravenous contrast. COMPARISON:  Brain MRI 07/28/2014.  Head CT scan 05/27/2014. FINDINGS: Brain: No evidence of acute infarction, hemorrhage, hydrocephalus, extra-axial collection or mass lesion/mass effect. 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 Vascular: No hyperdense vessel or unexpected calcification. Skull: Intact.  No focal lesion. Sinuses/Orbits: Status post bilateral lens extraction. Otherwise negative. Other: None. IMPRESSION: No acute abnormality. Chronic microvascular ischemic change. Electronically Signed   By: Inge Rise M.D.   On: 02/09/2018 13:04      Subjective: Patient denies recurrence of chest pain since ED.  Reported chronic intermittent tingling and numbness of face ongoing for a long time.  No asymmetric limb weakness, tingling or numbness.  No  facial asymmetry, slurred speech or swallowing difficulties.  Discharge Exam:  Vitals:   02/11/18 0247 02/11/18 0625 02/11/18 0900 02/11/18 1200  BP:  115/74 139/80 (!) 152/92  Pulse:  (!) 55 (!) 56 (!) 57  Resp:  16 17 18   Temp:  97.6 F (36.4 C) 98.1 F (36.7 C) 98.1 F (36.7 C)  TempSrc:  Oral Oral Oral  SpO2:  100% 98% 98%  Weight: 64.6 kg     Height:        General: Pleasant elderly female, moderately built and nourished, sitting up comfortably in chair. Cardiovascular: S1 & S2 heard, RRR. No murmurs, rubs, gallops or clicks. No JVD or pedal edema.  Telemetry personally reviewed: Sinus rhythm. Respiratory: Clear to auscultation without wheezing, rhonchi or crackles. No increased work of breathing. Abdominal:  Non distended, non tender & soft. No organomegaly or masses appreciated. Normal bowel sounds heard. CNS: Alert and oriented. No focal deficits. Extremities: no edema, no cyanosis    The results of significant diagnostics from this hospitalization (including imaging, microbiology, ancillary and laboratory) are listed below for reference.     Labs: CBC: Recent Labs  Lab 02/04/18 1426 02/09/18 1014 02/10/18 0434  WBC 6.6 6.0 5.1  HGB 12.7 14.4 12.3  HCT 39.9 46.3* 38.5  MCV 90.3 92.4 89.5  PLT 215 242 938   Basic Metabolic Panel: Recent Labs  Lab 02/04/18 1426 02/09/18 1014  NA 141 142  K 4.1 4.7  CL 105 101  CO2 25 29  GLUCOSE 105* 125*  BUN 13 16  CREATININE 0.80 0.98  CALCIUM 9.5 10.5*   Liver Function Tests: Recent Labs  Lab 02/04/18 1426  AST 26  ALT 22  ALKPHOS 54  BILITOT 0.9  PROT 7.0  ALBUMIN 3.8   Cardiac Enzymes: Recent Labs  Lab 02/09/18 2123 02/10/18 0019 02/10/18 0434  TROPONINI <0.03 <0.03 <0.03   CBG: Recent Labs  Lab 02/04/18 1425 02/09/18 2025 02/10/18 0754 02/10/18 1142 02/10/18 1635  GLUCAP 101* 81 77 110* 100*    Lipid Profile Recent Labs    02/10/18 0434  CHOL 186  HDL 58  LDLCALC 114*  TRIG 71   CHOLHDL 3.2    Urinalysis    Component Value Date/Time   COLORURINE YELLOW 02/09/2018 [place] 02/09/2018 1203   LABSPEC 1.006 02/09/2018 1203   PHURINE 7.0 02/09/2018 1203   GLUCOSEU NEGATIVE 02/09/2018 1203   HGBUR NEGATIVE 02/09/2018 1203   BILIRUBINUR NEGATIVE 02/09/2018 1203   KETONESUR NEGATIVE 02/09/2018 1203   PROTEINUR NEGATIVE 02/09/2018 1203   UROBILINOGEN 0.2 11/27/2014 0311   NITRITE NEGATIVE 02/09/2018 1203   LEUKOCYTESUR NEGATIVE 02/09/2018 1203      Time coordinating discharge: Over 30 minutes  SIGNED:  Vernell Leep, MD,  FACP, FHM. [facility] Pager [phone]  If 7PM-7AM, please contact night-coverage [email or web address] Password [email or web address] 02/11/2018, 1:58 PM

## 2018-02-11 NOTE — Care Management CC44 (Signed)
Condition Code 44 Documentation Completed  Patient Details  Name: Sara Brown MRN: [id number] Date of Birth: 02-16-1941   Condition Code 44 given:  Yes Patient signature on Condition Code 44 notice:  Yes Documentation of 2 MD's agreement:  Yes Code 44 added to claim:  Yes    Royston Bake, RN 02/11/2018, 10:21 AM

## 2018-02-11 NOTE — Progress Notes (Signed)
Patient discharged home to have CT as outpatient.  Discussed discharge instruction including follow up appointments and medications with patient.  Encouraged patient to keep all follow up appointments.  Sent discharge instructions home with patient.  Discharged home with husband.  Patient had no further questions.

## 2018-02-11 NOTE — Progress Notes (Signed)
To the best of my knowledge, documentation by D Downs, [employer] nursing student, is correct. 

## 2018-02-11 NOTE — Care Management Obs Status (Signed)
[place] NOTIFICATION   Patient Details  Name: Sara Brown MRN: [id number] Date of Birth: 30-Jun-1940   Medicare Observation Status Notification Given:  Yes    Royston Bake, RN 02/11/2018, 10:20 AM

## 2018-02-11 NOTE — Discharge Instructions (Signed)

## 2018-02-12 DIAGNOSIS — E782 Mixed hyperlipidemia: Secondary | ICD-10-CM | POA: Diagnosis not present

## 2018-02-12 DIAGNOSIS — I10 Essential (primary) hypertension: Secondary | ICD-10-CM | POA: Diagnosis not present

## 2018-02-12 DIAGNOSIS — R0789 Other chest pain: Secondary | ICD-10-CM | POA: Diagnosis not present

## 2018-02-12 DIAGNOSIS — E119 Type 2 diabetes mellitus without complications: Secondary | ICD-10-CM | POA: Diagnosis not present

## 2018-02-17 DIAGNOSIS — H9202 Otalgia, left ear: Secondary | ICD-10-CM | POA: Diagnosis not present

## 2018-02-17 DIAGNOSIS — R221 Localized swelling, mass and lump, neck: Secondary | ICD-10-CM | POA: Diagnosis not present

## 2018-02-23 DIAGNOSIS — R0989 Other specified symptoms and signs involving the circulatory and respiratory systems: Secondary | ICD-10-CM | POA: Diagnosis not present

## 2018-03-06 ENCOUNTER — Encounter: Payer: Self-pay | Admitting: Podiatry

## 2018-03-06 ENCOUNTER — Ambulatory Visit (INDEPENDENT_AMBULATORY_CARE_PROVIDER_SITE_OTHER): Payer: Medicare Other | Admitting: Podiatry

## 2018-03-06 DIAGNOSIS — M79675 Pain in left toe(s): Secondary | ICD-10-CM | POA: Diagnosis not present

## 2018-03-06 DIAGNOSIS — M79674 Pain in right toe(s): Secondary | ICD-10-CM | POA: Diagnosis not present

## 2018-03-06 DIAGNOSIS — L84 Corns and callosities: Secondary | ICD-10-CM | POA: Diagnosis not present

## 2018-03-06 DIAGNOSIS — B351 Tinea unguium: Secondary | ICD-10-CM | POA: Diagnosis not present

## 2018-03-06 DIAGNOSIS — E1142 Type 2 diabetes mellitus with diabetic polyneuropathy: Secondary | ICD-10-CM

## 2018-03-10 DIAGNOSIS — S91331A Puncture wound without foreign body, right foot, initial encounter: Secondary | ICD-10-CM | POA: Diagnosis not present

## 2018-03-10 DIAGNOSIS — M79662 Pain in left lower leg: Secondary | ICD-10-CM | POA: Diagnosis not present

## 2018-03-10 DIAGNOSIS — Z6823 Body mass index (BMI) 23.0-23.9, adult: Secondary | ICD-10-CM | POA: Diagnosis not present

## 2018-03-10 NOTE — Progress Notes (Signed)
Subjective: Sara Brown presents to clinic today for follow up diabetic foot care with history of diabetic neuropathy, painful mycotic toenails and painful porokeratoses. Pain is aggravated with weightbearing in enclosed shoe gear and is relieved with periodic professional debridement.  She has received and is wearing her diabetic shoes. She has no complaints.  Objective:  77 y.o. AAF, WD, WN in NAD. AAO x 3.  Vascular Examination: Capillary refill time <3 seconds x 10 digits Dorsalis pedis and Posterior tibial pulses present b/l No digital hair x 10 digits Skin temperature warm to cool b/l  Dermatological Examination: Skin with normal turgor, texture and tone b/l Toenails 1-5 b/l discolored, thick, dystrophic with subungual debris and pain with palpation to nailbeds due to thickness of nails. Hyperkeratotic lesion plantarcentral arch right foot and plantarlateral arch left foot Hyperkeratotic lesion dorsal 5th PIPJ  Musculoskeletal: Muscle strength 5/5 to all LE muscle groups Hammertoe deformity 2-5 b/l  Neurological: Sensation diminished with 10 gram monofilament. Vibratory sensation diminished  Assessment: 1. Painful onychomycosis toenails 1-5 b/l 2.  Callus  plantarcentral arch right foot and plantarlateral arch left foot 3.  Corn dorsal 5th PIPJ 4.  NIDDM with Diabetic neuropathy  Plan: 1. Continue diabetic foot care principles.  2. Toenails 1-5 b/l were debrided in length and girth without iatrogenic bleeding. 3.  Hyperkeratotic lesions pared plantarcentral arch right foot and plantarlateral arch left foot and dorsal 5th PIPJ 4.  Continue diabetic shoes daily 5.  Patient to report any pedal injuries to medical professional  6.  Follow up 3 months. Patient/POA to call should there be a concern in the interim.

## 2018-03-11 ENCOUNTER — Other Ambulatory Visit (HOSPITAL_COMMUNITY): Payer: Self-pay | Admitting: Cardiology

## 2018-03-11 DIAGNOSIS — R072 Precordial pain: Secondary | ICD-10-CM

## 2018-03-19 DIAGNOSIS — M25511 Pain in right shoulder: Secondary | ICD-10-CM | POA: Diagnosis not present

## 2018-03-19 DIAGNOSIS — Z1289 Encounter for screening for malignant neoplasm of other sites: Secondary | ICD-10-CM | POA: Diagnosis not present

## 2018-03-19 DIAGNOSIS — Z6823 Body mass index (BMI) 23.0-23.9, adult: Secondary | ICD-10-CM | POA: Diagnosis not present

## 2018-03-19 DIAGNOSIS — L609 Nail disorder, unspecified: Secondary | ICD-10-CM | POA: Diagnosis not present

## 2018-03-20 DIAGNOSIS — Z23 Encounter for immunization: Secondary | ICD-10-CM | POA: Diagnosis not present

## 2018-03-23 ENCOUNTER — Other Ambulatory Visit: Payer: Self-pay | Admitting: Internal Medicine

## 2018-03-23 DIAGNOSIS — Z1231 Encounter for screening mammogram for malignant neoplasm of breast: Secondary | ICD-10-CM

## 2018-03-24 DIAGNOSIS — I10 Essential (primary) hypertension: Secondary | ICD-10-CM | POA: Diagnosis not present

## 2018-03-26 DIAGNOSIS — E119 Type 2 diabetes mellitus without complications: Secondary | ICD-10-CM | POA: Diagnosis not present

## 2018-03-26 DIAGNOSIS — E782 Mixed hyperlipidemia: Secondary | ICD-10-CM | POA: Diagnosis not present

## 2018-03-26 DIAGNOSIS — R0789 Other chest pain: Secondary | ICD-10-CM | POA: Diagnosis not present

## 2018-03-26 DIAGNOSIS — I10 Essential (primary) hypertension: Secondary | ICD-10-CM | POA: Diagnosis not present

## 2018-03-30 ENCOUNTER — Ambulatory Visit: Payer: Medicare Other | Admitting: Orthotics

## 2018-03-30 DIAGNOSIS — M79674 Pain in right toe(s): Principal | ICD-10-CM

## 2018-03-30 DIAGNOSIS — B351 Tinea unguium: Secondary | ICD-10-CM

## 2018-03-30 DIAGNOSIS — E1142 Type 2 diabetes mellitus with diabetic polyneuropathy: Secondary | ICD-10-CM

## 2018-03-30 DIAGNOSIS — Q828 Other specified congenital malformations of skin: Secondary | ICD-10-CM

## 2018-03-30 DIAGNOSIS — L84 Corns and callosities: Secondary | ICD-10-CM

## 2018-03-30 DIAGNOSIS — M79675 Pain in left toe(s): Principal | ICD-10-CM

## 2018-03-30 NOTE — Progress Notes (Signed)
Reordered dbs:  Ortho 517 size 78M

## 2018-03-31 ENCOUNTER — Encounter (HOSPITAL_COMMUNITY): Payer: Self-pay

## 2018-03-31 ENCOUNTER — Other Ambulatory Visit: Payer: Self-pay

## 2018-03-31 ENCOUNTER — Ambulatory Visit (HOSPITAL_COMMUNITY)
Admission: RE | Admit: 2018-03-31 | Discharge: 2018-03-31 | Disposition: A | Discharge status: Home / Self Care | Payer: Medicare Other | Source: Ambulatory Visit | Attending: Cardiology | Admitting: Cardiology

## 2018-03-31 ENCOUNTER — Emergency Department (HOSPITAL_COMMUNITY): Payer: Medicare Other

## 2018-03-31 ENCOUNTER — Observation Stay (HOSPITAL_COMMUNITY)
Admission: EM | Admit: 2018-03-31 | Discharge: 2018-04-01 | Disposition: A | Discharge status: Home / Self Care | Payer: Medicare Other | Attending: Internal Medicine | Admitting: Internal Medicine

## 2018-03-31 DIAGNOSIS — E1140 Type 2 diabetes mellitus with diabetic neuropathy, unspecified: Secondary | ICD-10-CM | POA: Insufficient documentation

## 2018-03-31 DIAGNOSIS — Z79899 Other long term (current) drug therapy: Secondary | ICD-10-CM | POA: Diagnosis not present

## 2018-03-31 DIAGNOSIS — M1990 Unspecified osteoarthritis, unspecified site: Secondary | ICD-10-CM | POA: Insufficient documentation

## 2018-03-31 DIAGNOSIS — Z888 Allergy status to other drugs, medicaments and biological substances status: Secondary | ICD-10-CM | POA: Diagnosis not present

## 2018-03-31 DIAGNOSIS — Z9841 Cataract extraction status, right eye: Secondary | ICD-10-CM | POA: Diagnosis not present

## 2018-03-31 DIAGNOSIS — I25110 Atherosclerotic heart disease of native coronary artery with unstable angina pectoris: Secondary | ICD-10-CM | POA: Diagnosis not present

## 2018-03-31 DIAGNOSIS — E119 Type 2 diabetes mellitus without complications: Secondary | ICD-10-CM | POA: Diagnosis not present

## 2018-03-31 DIAGNOSIS — Z833 Family history of diabetes mellitus: Secondary | ICD-10-CM | POA: Diagnosis not present

## 2018-03-31 DIAGNOSIS — K219 Gastro-esophageal reflux disease without esophagitis: Secondary | ICD-10-CM | POA: Insufficient documentation

## 2018-03-31 DIAGNOSIS — R072 Precordial pain: Secondary | ICD-10-CM | POA: Diagnosis not present

## 2018-03-31 DIAGNOSIS — Z9889 Other specified postprocedural states: Secondary | ICD-10-CM | POA: Insufficient documentation

## 2018-03-31 DIAGNOSIS — I10 Essential (primary) hypertension: Secondary | ICD-10-CM | POA: Diagnosis present

## 2018-03-31 DIAGNOSIS — R001 Bradycardia, unspecified: Secondary | ICD-10-CM | POA: Diagnosis not present

## 2018-03-31 DIAGNOSIS — Z9851 Tubal ligation status: Secondary | ICD-10-CM | POA: Insufficient documentation

## 2018-03-31 DIAGNOSIS — Z8673 Personal history of transient ischemic attack (TIA), and cerebral infarction without residual deficits: Secondary | ICD-10-CM | POA: Insufficient documentation

## 2018-03-31 DIAGNOSIS — E559 Vitamin D deficiency, unspecified: Secondary | ICD-10-CM | POA: Diagnosis not present

## 2018-03-31 DIAGNOSIS — E785 Hyperlipidemia, unspecified: Secondary | ICD-10-CM | POA: Insufficient documentation

## 2018-03-31 DIAGNOSIS — M810 Age-related osteoporosis without current pathological fracture: Secondary | ICD-10-CM | POA: Diagnosis not present

## 2018-03-31 DIAGNOSIS — Z7982 Long term (current) use of aspirin: Secondary | ICD-10-CM | POA: Insufficient documentation

## 2018-03-31 DIAGNOSIS — Z823 Family history of stroke: Secondary | ICD-10-CM | POA: Insufficient documentation

## 2018-03-31 DIAGNOSIS — I6523 Occlusion and stenosis of bilateral carotid arteries: Secondary | ICD-10-CM | POA: Diagnosis not present

## 2018-03-31 DIAGNOSIS — Z9842 Cataract extraction status, left eye: Secondary | ICD-10-CM | POA: Diagnosis not present

## 2018-03-31 DIAGNOSIS — I200 Unstable angina: Secondary | ICD-10-CM | POA: Diagnosis present

## 2018-03-31 DIAGNOSIS — R0602 Shortness of breath: Secondary | ICD-10-CM | POA: Diagnosis not present

## 2018-03-31 DIAGNOSIS — Z8249 Family history of ischemic heart disease and other diseases of the circulatory system: Secondary | ICD-10-CM | POA: Insufficient documentation

## 2018-03-31 DIAGNOSIS — R079 Chest pain, unspecified: Secondary | ICD-10-CM | POA: Diagnosis present

## 2018-03-31 DIAGNOSIS — J45909 Unspecified asthma, uncomplicated: Secondary | ICD-10-CM | POA: Diagnosis not present

## 2018-03-31 DIAGNOSIS — Z886 Allergy status to analgesic agent status: Secondary | ICD-10-CM | POA: Diagnosis not present

## 2018-03-31 DIAGNOSIS — R0789 Other chest pain: Secondary | ICD-10-CM | POA: Diagnosis not present

## 2018-03-31 HISTORY — DX: Essential (primary) hypertension: I10

## 2018-03-31 HISTORY — DX: Type 2 diabetes mellitus without complications: E11.9

## 2018-03-31 LAB — TROPONIN I

## 2018-03-31 LAB — CBC
HEMATOCRIT: 38.8 % (ref 36.0–46.0)
HEMOGLOBIN: 12.4 g/dL (ref 12.0–15.0)
MCH: 28.6 pg (ref 26.0–34.0)
MCHC: 32 g/dL (ref 30.0–36.0)
MCV: 89.6 fL (ref 80.0–100.0)
Platelets: 236 10*3/uL (ref 150–400)
RBC: 4.33 MIL/uL (ref 3.87–5.11)
RDW: 13.2 % (ref 11.5–15.5)
WBC: 4.9 10*3/uL (ref 4.0–10.5)
nRBC: 0 % (ref 0.0–0.2)

## 2018-03-31 LAB — BASIC METABOLIC PANEL
Anion gap: 7 (ref 5–15)
BUN: 19 mg/dL (ref 8–23)
CHLORIDE: 106 mmol/L (ref 98–111)
CO2: 24 mmol/L (ref 22–32)
Calcium: 9.6 mg/dL (ref 8.9–10.3)
Creatinine, Ser: 0.91 mg/dL (ref 0.44–1.00)
GFR calc Af Amer: >60 mL/min (ref >60)
GFR calc non Af Amer: 58 mL/min — ABNORMAL LOW (ref >60)
Glucose, Bld: 98 mg/dL (ref 70–99)
POTASSIUM: 4.2 mmol/L (ref 3.5–5.1)
Sodium: 137 mmol/L (ref 135–145)

## 2018-03-31 LAB — HEMOGLOBIN A1C
Hgb A1c MFr Bld: 6.2 % — ABNORMAL HIGH (ref 4.8–5.6)
Mean Plasma Glucose: 131.24 mg/dL

## 2018-03-31 LAB — I-STAT TROPONIN, ED: Troponin i, poc: 0 ng/mL (ref 0.00–0.08)

## 2018-03-31 LAB — CBG MONITORING, ED: Glucose-Capillary: 117 mg/dL — ABNORMAL HIGH (ref 70–99)

## 2018-03-31 MED ORDER — ASPIRIN 81 MG PO CHEW
81.0000 mg | CHEWABLE_TABLET | ORAL | Status: AC
  Administered 2018-04-01: 81 mg via ORAL
  Filled 2018-03-31 (×2): qty 1

## 2018-03-31 MED ORDER — CLONIDINE HCL 0.1 MG PO TABS
0.1000 mg | ORAL_TABLET | Freq: Every day | ORAL | Status: DC
  Administered 2018-03-31: 0.1 mg via ORAL
  Filled 2018-03-31: qty 1

## 2018-03-31 MED ORDER — MORPHINE SULFATE (PF) 2 MG/ML IV SOLN: 2.0000 mg | INTRAVENOUS | Status: DC | PRN

## 2018-03-31 MED ORDER — ATORVASTATIN CALCIUM 40 MG PO TABS
40.0000 mg | ORAL_TABLET | Freq: Every day | ORAL | Status: DC
  Filled 2018-03-31: qty 1

## 2018-03-31 MED ORDER — SODIUM CHLORIDE 0.9 % IV SOLN: 250.0000 mL | INTRAVENOUS | Status: DC | PRN

## 2018-03-31 MED ORDER — IOPAMIDOL (ISOVUE-370) INJECTION 76%: 100.0000 mL | Freq: Once | INTRAVENOUS | Status: DC

## 2018-03-31 MED ORDER — ONDANSETRON HCL 4 MG/2ML IJ SOLN: 4.0000 mg | Freq: Four times a day (QID) | INTRAMUSCULAR | Status: DC | PRN

## 2018-03-31 MED ORDER — CYCLOSPORINE 0.05 % OP EMUL
1.0000 [drp] | Freq: Two times a day (BID) | OPHTHALMIC | Status: DC
  Administered 2018-03-31: 1 [drp] via OPHTHALMIC
  Filled 2018-03-31 (×2): qty 1

## 2018-03-31 MED ORDER — SODIUM CHLORIDE 0.9 % IV SOLN
INTRAVENOUS | Status: AC
  Administered 2018-03-31: 22:00:00 via INTRAVENOUS

## 2018-03-31 MED ORDER — HYDRALAZINE HCL 25 MG PO TABS: 25.0000 mg | ORAL_TABLET | Freq: Two times a day (BID) | ORAL | Status: DC | PRN

## 2018-03-31 MED ORDER — SODIUM CHLORIDE 0.9% FLUSH: 3.0000 mL | Freq: Two times a day (BID) | INTRAVENOUS | Status: DC

## 2018-03-31 MED ORDER — ATORVASTATIN CALCIUM 20 MG PO TABS: 20.0000 mg | ORAL_TABLET | Freq: Every day | ORAL | Status: DC

## 2018-03-31 MED ORDER — ASPIRIN EC 81 MG PO TBEC: 81.0000 mg | DELAYED_RELEASE_TABLET | Freq: Every day | ORAL | Status: DC

## 2018-03-31 MED ORDER — ACETAMINOPHEN 325 MG PO TABS: 650.0000 mg | ORAL_TABLET | ORAL | Status: DC | PRN

## 2018-03-31 MED ORDER — POLYETHYLENE GLYCOL 3350 17 G PO PACK
17.0000 g | PACK | Freq: Every day | ORAL | Status: DC
  Administered 2018-03-31: 17 g via ORAL
  Filled 2018-03-31: qty 1

## 2018-03-31 MED ORDER — SODIUM CHLORIDE 0.9% FLUSH: 3.0000 mL | INTRAVENOUS | Status: DC | PRN

## 2018-03-31 MED ORDER — SPIRONOLACTONE 50 MG PO TABS
50.0000 mg | ORAL_TABLET | Freq: Every day | ORAL | Status: DC
  Filled 2018-03-31 (×2): qty 1

## 2018-03-31 NOTE — H&P (Signed)
History and Physical    Sara Brown LPF:[id number] DOB: 1941/03/12 DOA: 03/31/2018  PCP: Shon Baton, MD Consultants:  Einar Gip - cardiology; [facility] Patient coming from:  Home - lives with husband; NOK: husband, [phone], [phone]; son, [phone]  Chief Complaint: Chest pain  HPI: Sara Brown is a 77 y.o. female with medical history significant of DM; TIA; OA; HTN; HLD; and carotid stenosis presenting with chest pain.  She was here recently for chest pain.  She was referred for a coronary CTA.  They checked her BP and it was decided that she shouldn't have the study due to HR 42.  She did have left-sided chest pain earlier this AM.  She was moving around the house when the pain started.  She was concerned that it was from GERD, but the discomfort continued.  She had been taking hydralazine and this was not effectively controlling her BP and so they started atenolol last week.  Since then, her BP and HR have been too low.  She feels fatigued, "I don't feel like myself, not at all."  Her pain is now a numb, dull feeling.   ED Course:  Chest pain.  Due for coronary CTA today.  HR in 40s, likely from new atenolol.  CP at cardiology, sent to ER.  Stress May 2019, cards not overly concerned for ACS in 9/19 while admitted.  Review of Systems: As per HPI; otherwise review of systems reviewed and negative.   Ambulatory Status:  Ambulates without assistance  Past Medical History:  Diagnosis Date  . Allergic rhinitis   . Arthritis    "fingers, knees" (02/09/2018)  . Asthma    "stopped taking RX after dr said I don't have this" (02/09/2018)  . Carotid stenosis    ICA(L)  . Complication of anesthesia    "felt the cut w/one of my c-sections" (02/09/2018)  . Constipation   . GERD (gastroesophageal reflux disease)   . Headache    "years ago; before menopause" (02/09/2018)  . Hyperlipidemia   . Hypertension   . Neuropathy    face (R)  . OA (osteoarthritis)   .  Osteoporosis   . TIA (transient ischemic attack) ?2005  . Type II diabetes mellitus ([place])   . Vitamin D deficiency     Past Surgical History:  Procedure Laterality Date  . CATARACT EXTRACTION W/ INTRAOCULAR LENS  IMPLANT, BILATERAL Bilateral   . CESAREAN SECTION  1971; 1975  . TUBAL LIGATION  1975    Social History   Socioeconomic History  . Marital status: Married    Spouse name: Bland Span  . Number of children: 2  . Years of education: 61  . Highest education level: Not on file  Occupational History    Comment: employed at home  Social Needs  . Financial resource strain: Not on file  . Food insecurity:    Worry: Not on file    Inability: Not on file  . Transportation needs:    Medical: Not on file    Non-medical: Not on file  Tobacco Use  . Smoking status: Never Smoker  . Smokeless tobacco: Never Used  Substance and Sexual Activity  . Alcohol use: Never    Frequency: Never  . Drug use: Never  . Sexual activity: Not Currently  Lifestyle  . Physical activity:    Days per week: Not on file    Minutes per session: Not on file  . Stress: Not on file  Relationships  . Social connections:  Talks on phone: Not on file    Gets together: Not on file    Attends religious service: Not on file    Active member of club or organization: Not on file    Attends meetings of clubs or organizations: Not on file    Relationship status: Not on file  . Intimate partner violence:    Fear of current or ex partner: Not on file    Emotionally abused: Not on file    Physically abused: Not on file    Forced sexual activity: Not on file  Other Topics Concern  . Not on file  Social History Narrative   Consumes no caffeine    Allergies  Allergen Reactions  . Amlodipine Hives  . Naproxen Other (See Comments)    Patient preference   . Prednisone Other (See Comments)    Patient Preference   . Losartan Rash    Family History  Problem Relation Age of Onset  . Diabetes Mellitus  II Mother   . Osteoporosis Mother   . Hypertension Mother   . Diabetes Mellitus II Father   . CVA Sister        4  . CAD Sister        4  . Hypertension Sister        4  . Osteoarthritis Sister        4  . Diabetes Mellitus II Sister        4  . Dementia Sister        decreased  . Alcoholism Brother     Prior to Admission medications   Medication Sig Start Date End Date Taking? Authorizing Provider  aspirin EC 81 MG tablet Take 81 mg by mouth daily.    [provider]  carboxymethylcellulose (REFRESH PLUS) 0.5 % SOLN Place 1-2 drops into both eyes 3 (three) times daily. As directed     [provider]  Cholecalciferol (VITAMIN D3 PO) Take 2 tablets by mouth daily.    [provider]  cloNIDine (CATAPRES) 0.1 MG tablet Take 0.1 mg by mouth at bedtime.  10/01/17   [provider]  cycloSPORINE (RESTASIS) 0.05 % ophthalmic emulsion Place 1 drop into both eyes 2 (two) times daily.     [provider]  Emollient (CERAVE EX) Apply 1 application topically daily.     [provider]  fluticasone (FLONASE) 50 MCG/ACT nasal spray Place 1 spray into both nostrils at bedtime as needed for allergies or rhinitis.    [provider]  hydrALAZINE (APRESOLINE) 25 MG tablet TAKE 1 TABLET BY MOUTH TWICE A DAY AS NEEDED FOR SYSTOLIC BLOOD PRESSURE > 140 02/12/18   [provider]  KRILL OIL PO Take 1 capsule by mouth daily.    [provider]  Multiple Vitamins-Minerals (MULTIVITAMIN WITH MINERALS) tablet Take 1 tablet by mouth daily. Centrum for Women    [provider]  Pitavastatin Calcium (LIVALO) 1 MG TABS Take 1 mg by mouth daily.    [provider]  polyethylene glycol (MIRALAX / GLYCOLAX) packet Take 17 g by mouth at bedtime.     [provider]  pyridOXINE (VITAMIN B-6) 100 MG tablet Take 100 mg by mouth daily.     [provider]  raNITIdine HCl (ZANTAC PO) Take 1 tablet by mouth  daily as needed (acid reflux/heartburn).    [provider]  spironolactone (ALDACTONE) 50 MG tablet Take 50 mg by mouth daily.     [provider]  triamcinolone cream (KENALOG) 0.1 % Apply 1 application topically 2 (two) times daily as needed (rash/ eczema).  11/01/14   [provider]  vitamin B-12 (CYANOCOBALAMIN) 1000 MCG tablet Take 1,000 mcg by mouth daily.     [provider]  vitamin E 100 UNIT capsule Take 100 Units by mouth daily.     [provider]    Physical Exam: Vitals:   03/31/18 1330 03/31/18 1345 03/31/18 1400 03/31/18 1415  BP: 121/66 (!) 146/71 129/71 119/68  Pulse: (!) 46 (!) 43 (!) 41 (!) 51  Resp: 16 14 12 19   Temp:      TempSrc:      SpO2: 98% 98% 100% 98%  Weight:      Height:         General:  Appears calm and comfortable and is NAD Eyes:  PERRL, EOMI, normal lids, iris ENT:  grossly normal hearing, lips & tongue, mmm Neck:  no LAD, masses or thyromegaly; no carotid bruits Cardiovascular:  RRR, no m/r/g. No LE edema.  Respiratory:   CTA bilaterally with no wheezes/rales/rhonchi.  Normal respiratory effort. Abdomen:  soft, NT, ND, NABS Skin:  no rash or induration seen on limited exam Musculoskeletal:  grossly normal tone BUE/BLE, good ROM, no bony abnormality Lower extremity:  No LE edema.  Limited foot exam with no ulcerations.  2+ distal pulses. Psychiatric:  grossly normal mood and affect, speech fluent and appropriate, AOx3 Neurologic:  CN 2-12 grossly intact, moves all extremities in coordinated fashion, sensation intact    Radiological Exams on Admission: Dg Chest 2 View  Result Date: 03/31/2018 CLINICAL DATA:  Shortness of breath.  Mid chest pain EXAM: CHEST - 2 VIEW COMPARISON:  01/20/2016 FINDINGS: The heart size and mediastinal contours are within normal limits. Both lungs are clear. The visualized skeletal structures are unremarkable. IMPRESSION: No active cardiopulmonary disease. Electronically  Signed   By: Kathreen Devoid   On: 03/31/2018 12:12    EKG: Independently reviewed.  Marked sinus bradycardia with rate 44; nonspecific ST changes with no evidence of acute ischemia   Labs on Admission: I have personally reviewed the available labs and imaging studies at the time of the admission.  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  -CXR unremarkable.   -Initial cardiac troponin negative.  -EKG not indicative of acute ischemia.   -Negative nuclear stress test in 5/19, so low suspicion for ischemia -She was scheduled for coronary CTA today - which generally needs to be done as an outpatient - but she was deferred due to CP and bradycardia -As such, I have spoken to Dr. Virgina Jock and he will likely proceed with cardiac catheterization tonight or tomorrow for definitive answer -Will plan to place in observation status on telemetry to rule out ACS by overnight observation.  -cycle troponin q6h x 3 and repeat EKG in AM -Continue ASA 81 mg  daily -morphine given  Bradycardia -Likely related to Atenolol, which Dr. Virgina Jock started last week -Will d/c beta blocker at this time  HTN -Continue prn PO hydralazine -She  is likely to need an additional agent for BP control - perhaps an ACE vs. Low-dose Coreg  HLD -Livalo is non-formulary -Will change to Lipitor 40 mg daily as inpatient -Lipids were checked on 02/10/18 (TC 186, HDL 58, LDL 114, TG 71) so will not repeat at this time  DM -Despite reported h/o DM, she does not have any apparent A1c in Epic, including Care  Everywhere -Her highest glucose recorded in Epic was 128 -Will not cover with SSI at this time -Will check A1c   DVT prophylaxis:  SCDs Code Status:  Full - confirmed with patient/family Family Communication: Husband present throughout evaluation  Disposition Plan:  Home once clinically improved Consults called: Cardiology  Admission status: It is my clinical opinion that referral for OBSERVATION is reasonable and necessary in this patient based on the above information provided. The aforementioned taken together are felt to place the patient at high risk for further clinical deterioration. However it is anticipated that the patient may be medically stable for discharge from the hospital within 24 to 48 hours.    Karmen Bongo MD [facility]  If note is complete, please contact covering daytime or nighttime physician. [email or web address] Password [facility]  03/31/2018, 3:54 PM

## 2018-03-31 NOTE — Progress Notes (Signed)
Consent form at bedside. Pt waiting to sign form until morning when surgeon comes to talk to her about procedure.

## 2018-03-31 NOTE — H&P (View-Only) (Signed)
Reason for Consult: Chest pain Referring Physician: [facility]  Sara Brown is an 77 y.o. female.  HPI:   77 y/o African-American female with hypertension, diet controlled diabetes, hyperlipidemia,  GERD and remote history of TIA.  Given patient's recurrent chest pain symptoms, we discussed regarding different evaluation of coronary anatomy.  She was supposed to undergo coronary CT angiogram today.  However, she was found to be bradycardic at the imaging center and also complain of chest pain.  Her chest tightness lasted for 30 min and had resolved by the time she reached the ED. Of note, patient was started on atenolol 25 mg daily last week, as she was not happy with her blood pressure control while on hydralazine.  Past Medical History:  Diagnosis Date  . Allergic rhinitis   . Arthritis    "fingers, knees" (02/09/2018)  . Asthma    "stopped taking RX after dr said I don't have this" (02/09/2018)  . Carotid stenosis    ICA(L)  . Complication of anesthesia    "felt the cut w/one of my c-sections" (02/09/2018)  . Constipation   . GERD (gastroesophageal reflux disease)   . Headache    "years ago; before menopause" (02/09/2018)  . Hyperlipidemia   . Hypertension   . Neuropathy    face (R)  . OA (osteoarthritis)   . Osteoporosis   . TIA (transient ischemic attack) ?2005  . Type II diabetes mellitus ([place])   . Vitamin D deficiency     Past Surgical History:  Procedure Laterality Date  . CATARACT EXTRACTION W/ INTRAOCULAR LENS  IMPLANT, BILATERAL Bilateral   . CESAREAN SECTION  1971; 1975  . TUBAL LIGATION  1975    Family History  Problem Relation Age of Onset  . Diabetes Mellitus II Mother   . Osteoporosis Mother   . Hypertension Mother   . Diabetes Mellitus II Father   . CVA Sister        4  . CAD Sister        4  . Hypertension Sister        4  . Osteoarthritis Sister        4  . Diabetes Mellitus II Sister        4  . Dementia Sister        decreased  .  Alcoholism Brother     Social History:  reports that she has never smoked. She has never used smokeless tobacco. She reports that she does not drink alcohol or use drugs.  Allergies:  Allergies  Allergen Reactions  . Amlodipine Hives  . Naproxen Other (See Comments)    Patient preference   . Prednisone Other (See Comments)    Patient Preference   . Losartan Rash    Medications: I have reviewed the patient's current medications.  Results for orders placed or performed during the hospital encounter of 03/31/18 (from the past 48 hour(s))  Basic metabolic panel     Status: Abnormal   Collection Time: 03/31/18 11:42 AM  Result Value Ref Range   Sodium 137 135 - 145 mmol/L   Potassium 4.2 3.5 - 5.1 mmol/L   Chloride 106 98 - 111 mmol/L   CO2 24 22 - 32 mmol/L   Glucose, Bld 98 70 - 99 mg/dL   BUN 19 8 - 23 mg/dL   Creatinine, Ser 0.91 0.44 - 1.00 mg/dL   Calcium 9.6 8.9 - 10.3 mg/dL   GFR calc non Af Amer 58 (L) >60  mL/min   GFR calc Af Amer >60 >60 mL/min    Comment: (NOTE) The eGFR has been calculated using the CKD EPI equation. This calculation has not been validated in all clinical situations. eGFR's persistently <60 mL/min signify possible Chronic Kidney Disease.    Anion gap 7 5 - 15    Comment: Performed at [facility] [address] [place] [address]  CBC     Status: None   Collection Time: 03/31/18 11:42 AM  Result Value Ref Range   WBC 4.9 4.0 - 10.5 K/uL   RBC 4.33 3.87 - 5.11 MIL/uL   Hemoglobin 12.4 12.0 - 15.0 g/dL   HCT 38.8 36.0 - 46.0 %   MCV 89.6 80.0 - 100.0 fL   MCH 28.6 26.0 - 34.0 pg   MCHC 32.0 30.0 - 36.0 g/dL   RDW 13.2 11.5 - 15.5 %   Platelets 236 150 - 400 K/uL   nRBC 0.0 0.0 - 0.2 %    Comment: Performed at [facility], [place] [address], [place], [place] [address]  I-stat troponin, ED     Status: None   Collection Time: 03/31/18 12:05 PM  Result Value Ref Range   Troponin i, poc 0.00 0.00 - 0.08 ng/mL   Comment 3             Comment: Due to the release kinetics of cTnI, a negative result within the first hours of the onset of symptoms does not rule out myocardial infarction with certainty. If myocardial infarction is still suspected, repeat the test at appropriate intervals.   CBG monitoring, ED     Status: Abnormal   Collection Time: 03/31/18  3:43 PM  Result Value Ref Range   Glucose-Capillary 117 (H) 70 - 99 mg/dL  Hemoglobin A1c     Status: Abnormal   Collection Time: 03/31/18  4:13 PM  Result Value Ref Range   Hgb A1c MFr Bld 6.2 (H) 4.8 - 5.6 %    Comment: (NOTE) Pre diabetes:          5.7%-6.4% Diabetes:              >6.4% Glycemic control for   <7.0% adults with diabetes    Mean Plasma Glucose 131.24 mg/dL    Comment: Performed at [facility] [address] [place], [place] [address]    Dg Chest 2 View  Result Date: 03/31/2018 CLINICAL DATA:  Shortness of breath.  Mid chest pain EXAM: CHEST - 2 VIEW COMPARISON:  01/20/2016 FINDINGS: The heart size and mediastinal contours are within normal limits. Both lungs are clear. The visualized skeletal structures are unremarkable. IMPRESSION: No active cardiopulmonary disease. Electronically Signed   By: Kathreen Devoid   On: 03/31/2018 12:12   Cardiac Studies: Exercise nuclear stress test 10/21/2017: Exercise myoview stress 10/20/2017: 1. The resting electrocardiogram demonstrated normal sinus rhythm, normal resting conduction and no resting arrhythmias.  The stress electrocardiogram was normal.  Patient exercised on Bruce protocol for 7:43 minutes and achieved 8.54 METS. Stress test terminated due to mid sternal chest pain and 90% % MPHR achieved (Target HR >85%). Normal BP. 2.  Myocardial perfusion imaging is normal. Overall left ventricular systolic function was normal without regional wall motion abnormalities. The left ventricular ejection fraction was 76%.  This is a low risk study.  Echocardiogram 10/29/2017: Left ventricle  cavity is normal in size. Mild concentric hypertrophy of the left ventricle. Normal global wall motion. Normal diastolic filling  pattern. Calculated EF 69%. No significant valvular abnormality.  Insignificant pericardial effusion.  Carotid artery duplex 02/23/2018: No hemodynamically significant arterial disease in the internal carotid artery bilaterally. No significant plaque burden. Antegrade right vertebral artery flow. Antegrade left vertebral artery flow.  Review of Systems  Constitutional: Negative.   HENT: Negative.   Respiratory: Negative for shortness of breath.   Cardiovascular: Positive for chest pain.  Gastrointestinal: Negative.  Negative for abdominal pain, nausea and vomiting.  Genitourinary: Negative.   Musculoskeletal: Negative.   Neurological: Negative for dizziness and loss of consciousness.  Endo/Heme/Allergies: Does not bruise/bleed easily.  Psychiatric/Behavioral: Negative.   All other systems reviewed and are negative.  Blood pressure 139/74, pulse (!) 43, temperature 97.6 F (36.4 C), temperature source Oral, resp. rate 19, height 5' 6.5" (1.689 m), weight 64.9 kg, SpO2 99 %. Physical Exam  Nursing note and vitals reviewed. Constitutional: She is oriented to person, place, and time. She appears well-developed and well-nourished.  HENT:  Head: Normocephalic and atraumatic.  Eyes: Pupils are equal, round, and reactive to light. Conjunctivae are normal.  Neck: No JVD present.  Cardiovascular: Regular rhythm and intact distal pulses. Bradycardia present.  Respiratory: Effort normal and breath sounds normal. She has no wheezes. She has no rales.  GI: Soft. Bowel sounds are normal. There is no tenderness.  Musculoskeletal: She exhibits no edema.  Lymphadenopathy:    She has no cervical adenopathy.  Neurological: She is alert and oriented to person, place, and time.  Skin: Skin is warm and dry.  Psychiatric: She has a normal mood and affect.     Assessment/Recommendations:  77 y/o African-American female with hypertension, diet controlled diabetes, hyperlipidemia,  GERD and remote history of TIA.  Chest pain: Recurrent chest pain episodes at rest in spite of prior low risk stress test. Unstable angina cannot be excluded. I have discussed definitive coronary anatomy evaluation with the patient. She was supposed to get coronary CTA today which was canceled due to bradycardia and chest pain. Will plan on performing conventional coronary angiography tomorrow. Discussed risks/benefits, and alternate options with the patient  Bradycardia: Exaggerated response to atenolol. Agree with holding.  Hypertension: Continue spironolactone 50 mg daily. Could try Bidil 20-37.5 mg tid.   Sara Brown J Nathon Stefanski 03/31/2018, 5:02 PM   [place], MD [facility] [place] Pager: [phone] Office: [phone] If no answer Cell [phone]

## 2018-03-31 NOTE — Consult Note (Signed)
Reason for Consult: Chest pain Referring Physician: [facility]  Sara Brown is an 77 y.o. female.  HPI:   77 y/o African-American female with hypertension, diet controlled diabetes, hyperlipidemia,  GERD and remote history of TIA.  Given patient's recurrent chest pain symptoms, we discussed regarding different evaluation of coronary anatomy.  She was supposed to undergo coronary CT angiogram today.  However, she was found to be bradycardic at the imaging center and also complain of chest pain.  Her chest tightness lasted for 30 min and had resolved by the time she reached the ED. Of note, patient was started on atenolol 25 mg daily last week, as she was not happy with her blood pressure control while on hydralazine.  Past Medical History:  Diagnosis Date  . Allergic rhinitis   . Arthritis    "fingers, knees" (02/09/2018)  . Asthma    "stopped taking RX after dr said I don't have this" (02/09/2018)  . Carotid stenosis    ICA(L)  . Complication of anesthesia    "felt the cut w/one of my c-sections" (02/09/2018)  . Constipation   . GERD (gastroesophageal reflux disease)   . Headache    "years ago; before menopause" (02/09/2018)  . Hyperlipidemia   . Hypertension   . Neuropathy    face (R)  . OA (osteoarthritis)   . Osteoporosis   . TIA (transient ischemic attack) ?2005  . Type II diabetes mellitus ([place])   . Vitamin D deficiency     Past Surgical History:  Procedure Laterality Date  . CATARACT EXTRACTION W/ INTRAOCULAR LENS  IMPLANT, BILATERAL Bilateral   . CESAREAN SECTION  1971; 1975  . TUBAL LIGATION  1975    Family History  Problem Relation Age of Onset  . Diabetes Mellitus II Mother   . Osteoporosis Mother   . Hypertension Mother   . Diabetes Mellitus II Father   . CVA Sister        4  . CAD Sister        4  . Hypertension Sister        4  . Osteoarthritis Sister        4  . Diabetes Mellitus II Sister        4  . Dementia Sister        decreased  .  Alcoholism Brother     Social History:  reports that she has never smoked. She has never used smokeless tobacco. She reports that she does not drink alcohol or use drugs.  Allergies:  Allergies  Allergen Reactions  . Amlodipine Hives  . Naproxen Other (See Comments)    Patient preference   . Prednisone Other (See Comments)    Patient Preference   . Losartan Rash    Medications: I have reviewed the patient's current medications.  Results for orders placed or performed during the hospital encounter of 03/31/18 (from the past 48 hour(s))  Basic metabolic panel     Status: Abnormal   Collection Time: 03/31/18 11:42 AM  Result Value Ref Range   Sodium 137 135 - 145 mmol/L   Potassium 4.2 3.5 - 5.1 mmol/L   Chloride 106 98 - 111 mmol/L   CO2 24 22 - 32 mmol/L   Glucose, Bld 98 70 - 99 mg/dL   BUN 19 8 - 23 mg/dL   Creatinine, Ser 0.91 0.44 - 1.00 mg/dL   Calcium 9.6 8.9 - 10.3 mg/dL   GFR calc non Af Amer 58 (L) >60  mL/min   GFR calc Af Amer >60 >60 mL/min    Comment: (NOTE) The eGFR has been calculated using the CKD EPI equation. This calculation has not been validated in all clinical situations. eGFR's persistently <60 mL/min signify possible Chronic Kidney Disease.    Anion gap 7 5 - 15    Comment: Performed at [facility] [address] [place] [address]  CBC     Status: None   Collection Time: 03/31/18 11:42 AM  Result Value Ref Range   WBC 4.9 4.0 - 10.5 K/uL   RBC 4.33 3.87 - 5.11 MIL/uL   Hemoglobin 12.4 12.0 - 15.0 g/dL   HCT 38.8 36.0 - 46.0 %   MCV 89.6 80.0 - 100.0 fL   MCH 28.6 26.0 - 34.0 pg   MCHC 32.0 30.0 - 36.0 g/dL   RDW 13.2 11.5 - 15.5 %   Platelets 236 150 - 400 K/uL   nRBC 0.0 0.0 - 0.2 %    Comment: Performed at [facility], [place] [address], [place], [place] [address]  I-stat troponin, ED     Status: None   Collection Time: 03/31/18 12:05 PM  Result Value Ref Range   Troponin i, poc 0.00 0.00 - 0.08 ng/mL   Comment 3             Comment: Due to the release kinetics of cTnI, a negative result within the first hours of the onset of symptoms does not rule out myocardial infarction with certainty. If myocardial infarction is still suspected, repeat the test at appropriate intervals.   CBG monitoring, ED     Status: Abnormal   Collection Time: 03/31/18  3:43 PM  Result Value Ref Range   Glucose-Capillary 117 (H) 70 - 99 mg/dL  Hemoglobin A1c     Status: Abnormal   Collection Time: 03/31/18  4:13 PM  Result Value Ref Range   Hgb A1c MFr Bld 6.2 (H) 4.8 - 5.6 %    Comment: (NOTE) Pre diabetes:          5.7%-6.4% Diabetes:              >6.4% Glycemic control for   <7.0% adults with diabetes    Mean Plasma Glucose 131.24 mg/dL    Comment: Performed at [facility] [address] [place], [place] [address]    Dg Chest 2 View  Result Date: 03/31/2018 CLINICAL DATA:  Shortness of breath.  Mid chest pain EXAM: CHEST - 2 VIEW COMPARISON:  01/20/2016 FINDINGS: The heart size and mediastinal contours are within normal limits. Both lungs are clear. The visualized skeletal structures are unremarkable. IMPRESSION: No active cardiopulmonary disease. Electronically Signed   By: Kathreen Devoid   On: 03/31/2018 12:12   Cardiac Studies: Exercise nuclear stress test 10/21/2017: Exercise myoview stress 10/20/2017: 1. The resting electrocardiogram demonstrated normal sinus rhythm, normal resting conduction and no resting arrhythmias.  The stress electrocardiogram was normal.  Patient exercised on Bruce protocol for 7:43 minutes and achieved 8.54 METS. Stress test terminated due to mid sternal chest pain and 90% % MPHR achieved (Target HR >85%). Normal BP. 2.  Myocardial perfusion imaging is normal. Overall left ventricular systolic function was normal without regional wall motion abnormalities. The left ventricular ejection fraction was 76%.  This is a low risk study.  Echocardiogram 10/29/2017: Left ventricle  cavity is normal in size. Mild concentric hypertrophy of the left ventricle. Normal global wall motion. Normal diastolic filling  pattern. Calculated EF 69%. No significant valvular abnormality.  Insignificant pericardial effusion.  Carotid artery duplex 02/23/2018: No hemodynamically significant arterial disease in the internal carotid artery bilaterally. No significant plaque burden. Antegrade right vertebral artery flow. Antegrade left vertebral artery flow.  Review of Systems  Constitutional: Negative.   HENT: Negative.   Respiratory: Negative for shortness of breath.   Cardiovascular: Positive for chest pain.  Gastrointestinal: Negative.  Negative for abdominal pain, nausea and vomiting.  Genitourinary: Negative.   Musculoskeletal: Negative.   Neurological: Negative for dizziness and loss of consciousness.  Endo/Heme/Allergies: Does not bruise/bleed easily.  Psychiatric/Behavioral: Negative.   All other systems reviewed and are negative.  Blood pressure 139/74, pulse (!) 43, temperature 97.6 F (36.4 C), temperature source Oral, resp. rate 19, height 5' 6.5" (1.689 m), weight 64.9 kg, SpO2 99 %. Physical Exam  Nursing note and vitals reviewed. Constitutional: She is oriented to person, place, and time. She appears well-developed and well-nourished.  HENT:  Head: Normocephalic and atraumatic.  Eyes: Pupils are equal, round, and reactive to light. Conjunctivae are normal.  Neck: No JVD present.  Cardiovascular: Regular rhythm and intact distal pulses. Bradycardia present.  Respiratory: Effort normal and breath sounds normal. She has no wheezes. She has no rales.  GI: Soft. Bowel sounds are normal. There is no tenderness.  Musculoskeletal: She exhibits no edema.  Lymphadenopathy:    She has no cervical adenopathy.  Neurological: She is alert and oriented to person, place, and time.  Skin: Skin is warm and dry.  Psychiatric: She has a normal mood and affect.     Assessment/Recommendations:  77 y/o African-American female with hypertension, diet controlled diabetes, hyperlipidemia,  GERD and remote history of TIA.  Chest pain: Recurrent chest pain episodes at rest in spite of prior low risk stress test. Unstable angina cannot be excluded. I have discussed definitive coronary anatomy evaluation with the patient. She was supposed to get coronary CTA today which was canceled due to bradycardia and chest pain. Will plan on performing conventional coronary angiography tomorrow. Discussed risks/benefits, and alternate options with the patient  Bradycardia: Exaggerated response to atenolol. Agree with holding.  Hypertension: Continue spironolactone 50 mg daily. Could try Bidil 20-37.5 mg tid.   Phylicia Mcgaugh J Michille Mcelrath 03/31/2018, 5:02 PM   [place], MD [facility] [place] Pager: [phone] Office: [phone] If no answer Cell [phone]

## 2018-03-31 NOTE — ED Triage Notes (Signed)
Pt presents from radiology with reports of low heart rate.  Pt reports having medication changed last week from hydralazine to atenolol, pt reports feeling tired and weak.  Pt reports having L sided chest pain this morning that is now resolved, reports increased shortness of breath.

## 2018-03-31 NOTE — ED Provider Notes (Signed)
[facility] EMERGENCY DEPARTMENT Provider Note   CSN: [id number] Arrival date & time: 03/31/18  1105     History   Chief Complaint Chief Complaint  Patient presents with  . Chest Pain    HPI Sara Brown is a 77 y.o. female.  77 year old female with prior medical history as detailed below presents for evaluation of chest pain.  Patient reports that she was going to obtain a coronary CT today.  Upon arrival to radiology she complained of chest pain.  She was noted to be bradycardic into the 40s.  Patient was sent to the ED for further evaluation.  Patient reports that she started taking atenolol last week.  She denies dizziness or syncopal symptoms.  She does report substernal chest pressure this morning.  This chest pressure is resolved by the time of her arrival to the ED.  Chest pain lasted approximately 30 minutes.  She denies associated diaphoresis, nausea, vomiting, shortness breath, or other acute complaint.  The history is provided by the patient and medical records.  Chest Pain   This is a new problem. The current episode started 3 to 5 hours ago. The problem occurs rarely. The problem has been resolved. The pain is present in the substernal region. The pain is mild. The quality of the pain is described as pressure-like. The pain does not radiate. The symptoms are aggravated by certain positions. She has tried nothing for the symptoms.    Past Medical History:  Diagnosis Date  . Allergic rhinitis   . Arthritis    "fingers, knees" (02/09/2018)  . Asthma    "stopped taking RX after dr said I don't have this" (02/09/2018)  . Carotid stenosis    ICA(L)  . Complication of anesthesia    "felt the cut w/one of my c-sections" (02/09/2018)  . Constipation   . GERD (gastroesophageal reflux disease)   . Headache    "years ago; before menopause" (02/09/2018)  . Hyperlipidemia   . Hypertension   . Neuropathy    face (R)  . OA (osteoarthritis)   . Osteoporosis    . TIA (transient ischemic attack) ?2005  . Type II diabetes mellitus ([place])   . Vitamin D deficiency     Patient Active Problem List   Diagnosis Date Noted  . Chest pain 02/10/2018  . 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 Angina at rest [facility]) 02/09/2018  . Bradycardia 02/09/2018  . GERD (gastroesophageal reflux disease)   . Shortness of breath 07/30/2007  . Cough 07/30/2007    Past Surgical History:  Procedure Laterality Date  . CATARACT EXTRACTION W/ INTRAOCULAR LENS  IMPLANT, BILATERAL Bilateral   . CESAREAN SECTION  1971; 1975  . TUBAL LIGATION  1975     OB History   None      Home Medications    Prior to Admission medications   Medication Sig Start Date End Date Taking? Authorizing Provider  aspirin EC 81 MG tablet Take 81 mg by mouth daily.    [provider]  carboxymethylcellulose (REFRESH PLUS) 0.5 % SOLN Place 1-2 drops into both eyes 3 (three) times daily. As directed     [provider]  Cholecalciferol (VITAMIN D3 PO) Take 2 tablets by mouth daily.    [provider]  cloNIDine (CATAPRES) 0.1 MG tablet Take 0.1 mg by mouth at bedtime.  10/01/17   [provider]  cycloSPORINE (RESTASIS) 0.05 % ophthalmic emulsion Place 1 drop into both  eyes 2 (two) times daily.     [provider]  Emollient (CERAVE EX) Apply 1 application topically daily.     [provider]  fluticasone (FLONASE) 50 MCG/ACT nasal spray Place 1 spray into both nostrils at bedtime as needed for allergies or rhinitis.    [provider]  hydrALAZINE (APRESOLINE) 25 MG tablet TAKE 1 TABLET BY MOUTH TWICE A DAY AS NEEDED FOR SYSTOLIC BLOOD PRESSURE > 140 02/12/18   [provider]  KRILL OIL PO Take 1 capsule by mouth daily.    [provider]  Multiple Vitamins-Minerals (MULTIVITAMIN WITH MINERALS) tablet Take 1 tablet by mouth daily. Centrum for Women    [provider]  Pitavastatin  Calcium (LIVALO) 1 MG TABS Take 1 mg by mouth daily.    [provider]  polyethylene glycol (MIRALAX / GLYCOLAX) packet Take 17 g by mouth at bedtime.     [provider]  pyridOXINE (VITAMIN B-6) 100 MG tablet Take 100 mg by mouth daily.     [provider]  raNITIdine HCl (ZANTAC PO) Take 1 tablet by mouth daily as needed (acid reflux/heartburn).    [provider]  spironolactone (ALDACTONE) 50 MG tablet Take 50 mg by mouth daily.     [provider]  triamcinolone cream (KENALOG) 0.1 % Apply 1 application topically 2 (two) times daily as needed (rash/ eczema).  11/01/14   [provider]  vitamin B-12 (CYANOCOBALAMIN) 1000 MCG tablet Take 1,000 mcg by mouth daily.     [provider]  vitamin E 100 UNIT capsule Take 100 Units by mouth daily.     [provider]    Family History Family History  Problem Relation Age of Onset  . Diabetes Mellitus II Mother   . Osteoporosis Mother   . Hypertension Mother   . Diabetes Mellitus II Father   . CVA Sister        4  . CAD Sister        4  . Hypertension Sister        4  . Osteoarthritis Sister        4  . Diabetes Mellitus II Sister        4  . Dementia Sister        decreased  . Alcoholism Brother     Social History Social History   Tobacco Use  . Smoking status: Never Smoker  . Smokeless tobacco: Never Used  Substance Use Topics  . Alcohol use: Never    Frequency: Never  . Drug use: Never     Allergies   Amlodipine; Naproxen; Prednisone; and Losartan   Review of Systems Review of Systems  Cardiovascular: Positive for chest pain.  All other systems reviewed and are negative.    Physical Exam Updated Vital Signs BP 119/68   Pulse (!) 51   Temp 99 F (37.2 C) (Oral)   Resp 19   Ht 5' 6.5" (1.689 m)   Wt 64.9 kg   SpO2 98%   BMI 22.74 kg/m   Physical Exam  Constitutional: She is oriented to person, place, and time. She appears  well-developed and well-nourished. No distress.  HENT:  Head: Normocephalic and atraumatic.  Mouth/Throat: Oropharynx is clear and moist.  Eyes: Pupils are equal, round, and reactive to light. Conjunctivae and EOM are normal.  Neck: Normal range of motion. Neck supple.  Cardiovascular: Normal rate, regular rhythm and normal heart sounds.  Pulmonary/Chest: Effort normal and  breath sounds normal. No respiratory distress.  Abdominal: Soft. She exhibits no distension. There is no tenderness.  Musculoskeletal: Normal range of motion. She exhibits no edema or deformity.  Neurological: She is alert and oriented to person, place, and time.  Skin: Skin is warm and dry.  Psychiatric: She has a normal mood and affect.  Nursing note and vitals reviewed.    ED Treatments / Results  Labs (all labs ordered are listed, but only abnormal results are displayed) Labs Reviewed  BASIC METABOLIC PANEL - Abnormal; Notable for the following components:      Result Value   GFR calc non Af Amer 58 (*)    All other components within normal limits  CBC  I-STAT TROPONIN, ED    EKG EKG Interpretation  Date/Time:  Tuesday March 31 2018 11:12:42 EDT Ventricular Rate:  44 PR Interval:  186 QRS Duration: 84 QT Interval:  426 QTC Calculation: 364 R Axis:   54 Text Interpretation:  Marked sinus bradycardia Possible Anterior infarct , age undetermined Abnormal ECG Confirmed by Dene Gentry [phone]) on 03/31/2018 12:14:04 PM   Radiology Dg Chest 2 View  Result Date: 03/31/2018 CLINICAL DATA:  Shortness of breath.  Mid chest pain EXAM: CHEST - 2 VIEW COMPARISON:  01/20/2016 FINDINGS: The heart size and mediastinal contours are within normal limits. Both lungs are clear. The visualized skeletal structures are unremarkable. IMPRESSION: No active cardiopulmonary disease. Electronically Signed   By: Kathreen Devoid   On: 03/31/2018 12:12    Procedures Procedures (including critical care time)  Medications  Ordered in ED Medications - No data to display   Initial Impression / Assessment and Plan / ED Course  I have reviewed the triage vital signs and the nursing notes.  Pertinent labs & imaging results that were available during my care of the patient were reviewed by me and considered in my medical decision making (see chart for details).     MDM  Screen complete  Patient is presenting for evaluation of chest pain with concurrent bradycardia.  Patient's bradycardia is most likely secondary to recent initiation of atenolol.  Patient is tolerating his bradycardia well.  Chest pain is resolved upon my evaluation.  Her EKG is without evidence of acute ischemia.  First troponin is negative.  Given patient's comorbidities I will admit the patient for further work-up and cardiac monitoring.  Hospitalist service [facility]) is aware of case and will evaluate the patient for admission.  Final Clinical Impressions(s) / ED Diagnoses   Final diagnoses:  Chest pain, unspecified type  Bradycardia    ED Discharge Orders    None       Valarie Merino, MD 03/31/18 1536

## 2018-03-31 NOTE — Progress Notes (Signed)
Pt  Here for CT heart scan. States, "I almost thought I was having heart attack this morning, my chest hurt so bad." Pt very SOB and slight unsteady and sleepy" acting. Pts HR 42-44. States her HR has been low since they changed her meds a week ago and put her on Atenolol. BP 131/73, Sats 100% R air. Dr Aundra Dubin notified since he was "reading MD" for scan today. Pt taken to ER for evaluation, pts husband and pt both in agreement.

## 2018-04-01 ENCOUNTER — Institutional Professional Consult (permissible substitution): Payer: Medicare Other | Admitting: Neurology

## 2018-04-01 ENCOUNTER — Encounter (HOSPITAL_COMMUNITY)
Admission: EM | Disposition: A | Discharge status: Home / Self Care | Payer: Self-pay | Source: Home / Self Care | Attending: Emergency Medicine

## 2018-04-01 ENCOUNTER — Encounter

## 2018-04-01 DIAGNOSIS — I25110 Atherosclerotic heart disease of native coronary artery with unstable angina pectoris: Secondary | ICD-10-CM | POA: Diagnosis not present

## 2018-04-01 DIAGNOSIS — M810 Age-related osteoporosis without current pathological fracture: Secondary | ICD-10-CM | POA: Diagnosis not present

## 2018-04-01 DIAGNOSIS — J45909 Unspecified asthma, uncomplicated: Secondary | ICD-10-CM | POA: Diagnosis not present

## 2018-04-01 DIAGNOSIS — E785 Hyperlipidemia, unspecified: Secondary | ICD-10-CM | POA: Diagnosis not present

## 2018-04-01 DIAGNOSIS — M1990 Unspecified osteoarthritis, unspecified site: Secondary | ICD-10-CM | POA: Diagnosis not present

## 2018-04-01 DIAGNOSIS — I25118 Atherosclerotic heart disease of native coronary artery with other forms of angina pectoris: Secondary | ICD-10-CM | POA: Diagnosis not present

## 2018-04-01 DIAGNOSIS — R001 Bradycardia, unspecified: Secondary | ICD-10-CM | POA: Diagnosis not present

## 2018-04-01 DIAGNOSIS — K219 Gastro-esophageal reflux disease without esophagitis: Secondary | ICD-10-CM | POA: Diagnosis not present

## 2018-04-01 DIAGNOSIS — Z7982 Long term (current) use of aspirin: Secondary | ICD-10-CM | POA: Diagnosis not present

## 2018-04-01 DIAGNOSIS — I10 Essential (primary) hypertension: Secondary | ICD-10-CM | POA: Diagnosis not present

## 2018-04-01 DIAGNOSIS — E119 Type 2 diabetes mellitus without complications: Secondary | ICD-10-CM | POA: Diagnosis not present

## 2018-04-01 DIAGNOSIS — R072 Precordial pain: Secondary | ICD-10-CM

## 2018-04-01 DIAGNOSIS — E1140 Type 2 diabetes mellitus with diabetic neuropathy, unspecified: Secondary | ICD-10-CM | POA: Diagnosis not present

## 2018-04-01 DIAGNOSIS — E559 Vitamin D deficiency, unspecified: Secondary | ICD-10-CM | POA: Diagnosis not present

## 2018-04-01 DIAGNOSIS — I6523 Occlusion and stenosis of bilateral carotid arteries: Secondary | ICD-10-CM | POA: Diagnosis not present

## 2018-04-01 HISTORY — PX: CORONARY PRESSURE/FFR STUDY: CATH118243

## 2018-04-01 HISTORY — PX: LEFT HEART CATH AND CORONARY ANGIOGRAPHY: CATH118249

## 2018-04-01 LAB — TROPONIN I: Troponin I: <0.03 ng/mL (ref <0.03)

## 2018-04-01 LAB — POCT ACTIVATED CLOTTING TIME
Activated Clotting Time: 219 seconds
Activated Clotting Time: 274 seconds
Activated Clotting Time: 428 seconds

## 2018-04-01 SURGERY — LEFT HEART CATH AND CORONARY ANGIOGRAPHY
Anesthesia: LOCAL

## 2018-04-01 MED ORDER — IOHEXOL 350 MG/ML SOLN
INTRAVENOUS | Status: DC | PRN
  Administered 2018-04-01: 140 mL via INTRAVENOUS

## 2018-04-01 MED ORDER — MIDAZOLAM HCL 2 MG/2ML IJ SOLN
INTRAMUSCULAR | Status: AC
  Filled 2018-04-01: qty 2

## 2018-04-01 MED ORDER — NITROGLYCERIN 1 MG/10 ML FOR IR/CATH LAB
INTRA_ARTERIAL | Status: DC | PRN
  Administered 2018-04-01 (×2): 200 ug via INTRACORONARY

## 2018-04-01 MED ORDER — FENTANYL CITRATE (PF) 100 MCG/2ML IJ SOLN
INTRAMUSCULAR | Status: AC
  Filled 2018-04-01: qty 2

## 2018-04-01 MED ORDER — LIDOCAINE HCL (PF) 1 % IJ SOLN
INTRAMUSCULAR | Status: AC
  Filled 2018-04-01: qty 30

## 2018-04-01 MED ORDER — SODIUM CHLORIDE 0.9 % IV SOLN: 250.0000 mL | INTRAVENOUS | Status: DC | PRN

## 2018-04-01 MED ORDER — FENTANYL CITRATE (PF) 100 MCG/2ML IJ SOLN
INTRAMUSCULAR | Status: DC | PRN
  Administered 2018-04-01 (×2): 50 ug via INTRAVENOUS

## 2018-04-01 MED ORDER — HEPARIN (PORCINE) IN NACL 1000-0.9 UT/500ML-% IV SOLN
INTRAVENOUS | Status: DC | PRN
  Administered 2018-04-01 (×2): 500 mL

## 2018-04-01 MED ORDER — HEPARIN (PORCINE) IN NACL 1000-0.9 UT/500ML-% IV SOLN
INTRAVENOUS | Status: AC
  Filled 2018-04-01: qty 1000

## 2018-04-01 MED ORDER — NITROGLYCERIN 1 MG/10 ML FOR IR/CATH LAB
INTRA_ARTERIAL | Status: AC
  Filled 2018-04-01: qty 10

## 2018-04-01 MED ORDER — LIDOCAINE HCL (PF) 1 % IJ SOLN
INTRAMUSCULAR | Status: DC | PRN
  Administered 2018-04-01: 15 mL
  Administered 2018-04-01: 2 mL

## 2018-04-01 MED ORDER — SODIUM CHLORIDE 0.9% FLUSH: 3.0000 mL | INTRAVENOUS | Status: DC | PRN

## 2018-04-01 MED ORDER — VERAPAMIL HCL 2.5 MG/ML IV SOLN
INTRAVENOUS | Status: DC | PRN
  Administered 2018-04-01: 5 mL via INTRA_ARTERIAL

## 2018-04-01 MED ORDER — SODIUM CHLORIDE 0.9% FLUSH: 3.0000 mL | Freq: Two times a day (BID) | INTRAVENOUS | Status: DC

## 2018-04-01 MED ORDER — SODIUM CHLORIDE 0.9 % IV SOLN: INTRAVENOUS | Status: AC

## 2018-04-01 MED ORDER — HEPARIN SODIUM (PORCINE) 1000 UNIT/ML IJ SOLN
INTRAMUSCULAR | Status: DC | PRN
  Administered 2018-04-01 (×3): 2000 [IU] via INTRAVENOUS
  Administered 2018-04-01: 4000 [IU] via INTRAVENOUS

## 2018-04-01 MED ORDER — ACETAMINOPHEN 325 MG PO TABS: 650.0000 mg | ORAL_TABLET | ORAL | Status: DC | PRN

## 2018-04-01 MED ORDER — MIDAZOLAM HCL 2 MG/2ML IJ SOLN
INTRAMUSCULAR | Status: DC | PRN
  Administered 2018-04-01: 1 mg via INTRAVENOUS

## 2018-04-01 MED ORDER — ONDANSETRON HCL 4 MG/2ML IJ SOLN: 4.0000 mg | Freq: Four times a day (QID) | INTRAMUSCULAR | Status: DC | PRN

## 2018-04-01 MED ORDER — VERAPAMIL HCL 2.5 MG/ML IV SOLN
INTRAVENOUS | Status: AC
  Filled 2018-04-01: qty 2

## 2018-04-01 SURGICAL SUPPLY — 25 items
CATH INFINITI 5 FR 3DRC (CATHETERS) ×2 IMPLANT
CATH INFINITI 5FR MULTPACK ANG (CATHETERS) ×2 IMPLANT
CATH LAUNCHER 5F EBU3.0 (CATHETERS) ×1 IMPLANT
CATH LAUNCHER 5F EBU3.5 (CATHETERS) ×2 IMPLANT
CATH LAUNCHER 5F JL3.5 (CATHETERS) ×1 IMPLANT
CATH OPTITORQUE TIG 4.0 5F (CATHETERS) ×2 IMPLANT
CATHETER LAUNCHER 5F EBU3.0 (CATHETERS) ×2
CATHETER LAUNCHER 5F JL3.5 (CATHETERS) ×2
CLOSURE MYNX CONTROL 5F (Vascular Products) ×2 IMPLANT
DEVICE RAD COMP TR BAND LRG (VASCULAR PRODUCTS) ×2 IMPLANT
GLIDESHEATH SLEND A-KIT 6F 22G (SHEATH) ×2 IMPLANT
GLIDESHEATH SLEND SS 6F .021 (SHEATH) ×2 IMPLANT
GUIDEWIRE INQWIRE 1.5J.035X260 (WIRE) ×1 IMPLANT
GUIDEWIRE PRESSURE COMET II (WIRE) ×4 IMPLANT
INQWIRE 1.5J .035X260CM (WIRE) ×2
KIT HEART LEFT (KITS) ×2 IMPLANT
KIT HEMO VALVE WATCHDOG (MISCELLANEOUS) ×2 IMPLANT
KIT MICROPUNCTURE NIT STIFF (SHEATH) ×2 IMPLANT
PACK CARDIAC CATHETERIZATION (CUSTOM PROCEDURE TRAY) ×2 IMPLANT
SHEATH PINNACLE 5F 10CM (SHEATH) ×2 IMPLANT
SHEATH PROBE COVER 6X72 (BAG) ×2 IMPLANT
TRANSDUCER W/STOPCOCK (MISCELLANEOUS) ×2 IMPLANT
TUBING CIL FLEX 10 FLL-RA (TUBING) ×2 IMPLANT
WIRE EMERALD 3MM-J .035X150CM (WIRE) ×2 IMPLANT
WIRE HI TORQ VERSACORE-J 145CM (WIRE) ×2 IMPLANT

## 2018-04-01 NOTE — Discharge Summary (Signed)
Physician Discharge Summary  Sara Brown:[id number] DOB: [birth year] DOA: 03/31/2018  PCP: Shon Baton, MD  Admit date: 03/31/2018 Discharge date: 04/01/2018  Admitted From: home Discharge disposition: home   Recommendations for Outpatient Follow-Up:   1. Patient to pick up bidil samples from Cardiology   Discharge Diagnosis:   Principal Problem:   Chest pain Active Problems:   Bradycardia   Diabetes mellitus type 2 in nonobese [facility])   Essential hypertension   Dyslipidemia    Discharge Condition: Improved.  Diet recommendation: Low sodium, heart healthy.  Carbohydrate-modified.  Wound care: None.  Code status: Full.   History of Present Illness:   Sara Brown is a 77 y.o. female with medical history significant of DM; TIA; OA; HTN; HLD; and carotid stenosis presenting with chest pain.  She was here recently for chest pain.  She was referred for a coronary CTA.  They checked her BP and it was decided that she shouldn't have the study due to HR 42.  She did have left-sided chest pain earlier this AM.  She was moving around the house when the pain started.  She was concerned that it was from GERD, but the discomfort continued.  She had been taking hydralazine and this was not effectively controlling her BP and so they started atenolol last week.  Since then, her BP and HR have been too low.  She feels fatigued, "I don't feel like myself, not at all."  Her pain is now a numb, dull feeling.    Hospital Course by Problem:   Chest pain -s/p cath: Mild nonobstructive CAD  -outpatient cardiology follow up  Bradycardia -Likely related to Atenolol -Will d/c beta blocker at this time  HTN -patient to add bidil from samples to list  HLD -Livalo  -Lipids were checked on 02/10/18 (TC 186, HDL 58, LDL 114, TG 71)   DM -carb mod diet    Medical Consultants:    cardiology  Discharge Exam:   Vitals:   04/01/18 1040 04/01/18 1100  BP:  134/75   Pulse: (!) 132   Resp: 14   Temp:  (!) 97.4 F (36.3 C)  SpO2: 96%    Vitals:   04/01/18 1030 04/01/18 1035 04/01/18 1040 04/01/18 1100  BP: 140/81 137/84 134/75   Pulse: (!) 46 77 (!) 132   Resp: 11 (!) 7 14   Temp:    (!) 97.4 F (36.3 C)  TempSrc:    Oral  SpO2: 97% 98% 96%   Weight:      Height:        General exam: Appears calm and comfortable.   The results of significant diagnostics from this hospitalization (including imaging, microbiology, ancillary and laboratory) are listed below for reference.     Procedures and Diagnostic Studies:   Dg Chest 2 View  Result Date: 03/31/2018 CLINICAL DATA:  Shortness of breath.  Mid chest pain EXAM: CHEST - 2 VIEW COMPARISON:  01/20/2016 FINDINGS: The heart size and mediastinal contours are within normal limits. Both lungs are clear. The visualized skeletal structures are unremarkable. IMPRESSION: No active cardiopulmonary disease. Electronically Signed   By: Kathreen Devoid   On: 03/31/2018 12:12     Labs:   Basic Metabolic Panel: Recent Labs  Lab 03/31/18 1142  NA 137  K 4.2  CL 106  CO2 24  GLUCOSE 98  BUN 19  CREATININE 0.91  CALCIUM 9.6   GFR Estimated Creatinine Clearance: 47.9 mL/min (by C-G  formula based on SCr of 0.91 mg/dL). Liver Function Tests: No results for input(s): AST, ALT, ALKPHOS, BILITOT, PROT, ALBUMIN in the last 168 hours. No results for input(s): LIPASE, AMYLASE in the last 168 hours. No results for input(s): AMMONIA in the last 168 hours. Coagulation profile No results for input(s): INR, PROTIME in the last 168 hours.  CBC: Recent Labs  Lab 03/31/18 1142  WBC 4.9  HGB 12.4  HCT 38.8  MCV 89.6  PLT 236   Cardiac Enzymes: Recent Labs  Lab 03/31/18 1613 03/31/18 2225 04/01/18 0421  TROPONINI <0.03 <0.03 <0.03   BNP: Invalid input(s): POCBNP CBG: Recent Labs  Lab 03/31/18 1543  GLUCAP 117*   D-Dimer No results for input(s): DDIMER in the last 72 hours. Hgb  A1c Recent Labs    03/31/18 1613  HGBA1C 6.2*   Lipid Profile No results for input(s): CHOL, HDL, LDLCALC, TRIG, CHOLHDL, LDLDIRECT in the last 72 hours. Thyroid function studies No results for input(s): TSH, T4TOTAL, T3FREE, THYROIDAB in the last 72 hours.  Invalid input(s): FREET3 Anemia work up No results for input(s): VITAMINB12, FOLATE, FERRITIN, TIBC, IRON, RETICCTPCT in the last 72 hours. Microbiology No results found for this or any previous visit (from the past 240 hour(s)).   Discharge Instructions:    Allergies as of 04/01/2018      Reactions   Amlodipine Hives   Naproxen Other (See Comments)   Patient preference    Prednisone Other (See Comments)   Patient Preference    Losartan Rash      Medication List    STOP taking these medications   atenolol 25 MG tablet Commonly known as:  TENORMIN     TAKE these medications   aspirin EC 81 MG tablet Take 81 mg by mouth daily.   carboxymethylcellulose 0.5 % Soln Commonly known as:  REFRESH PLUS Place 1-2 drops into both eyes 3 (three) times daily.   CERAVE EX Apply 1 application topically See admin instructions. Apply to affected areas daily as directed or as needed for dryness   cloNIDine 0.1 MG tablet Commonly known as:  CATAPRES Take 0.1 mg by mouth at bedtime.   cycloSPORINE 0.05 % ophthalmic emulsion Commonly known as:  RESTASIS Place 1 drop into both eyes 2 (two) times daily.   fluticasone 50 MCG/ACT nasal spray Commonly known as:  FLONASE Place 1 spray into both nostrils at bedtime as needed for allergies or rhinitis.   KRILL OIL PO Take 1 capsule by mouth daily.   LIVALO 1 MG Tabs Generic drug:  Pitavastatin Calcium Take 1 mg by mouth daily.   multivitamin with minerals tablet Take 1 tablet by mouth daily. Centrum for Women   omeprazole 20 MG tablet Commonly known as:  PRILOSEC OTC Take 20 mg by mouth daily as needed (for reflux).   polyethylene glycol packet Commonly known as:   MIRALAX / GLYCOLAX Take 17 g by mouth at bedtime.   pyridOXINE 100 MG tablet Commonly known as:  VITAMIN B-6 Take 100 mg by mouth daily.   spironolactone 50 MG tablet Commonly known as:  ALDACTONE Take 50 mg by mouth daily.   triamcinolone cream 0.1 % Commonly known as:  KENALOG Apply 1 application topically 2 (two) times daily as needed (rash/ eczema).   vitamin B-12 1000 MCG tablet Commonly known as:  CYANOCOBALAMIN Take 1,000 mcg by mouth daily.   VITAMIN D3 PO Take 2 capsules by mouth daily.   vitamin E 100 UNIT capsule Take 100 Units  by mouth daily.         Time coordinating discharge: 25 min  Signed:  Geradine Girt  [facility] 04/01/2018, 12:08 PM

## 2018-04-01 NOTE — Interval H&P Note (Signed)
History and Physical Interval Note:  04/01/2018 8:29 AM  Sara Brown  has presented today for surgery, with the diagnosis of cp  The various methods of treatment have been discussed with the patient and family. After consideration of risks, benefits and other options for treatment, the patient has consented to  Procedure(s): LEFT HEART CATH AND CORONARY ANGIOGRAPHY (N/A) as a surgical intervention .  The patient's history has been reviewed, patient examined, no change in status, stable for surgery.  I have reviewed the patient's chart and labs.  Questions were answered to the patient's satisfaction.    2016 Appropriate Use Criteria for Coronary Revascularization in Patients With Acute Coronary Syndrome NSTEMI/UA Intermediate Risk (TIMI Score 3-4) NSTEMI/Unstable angina, stabilized patient at Intermediate Risk (TIMI Score 3-4) Link Here: http://dodson-rose.net/ Indication:  Revascularization by PCI or CABG of 1 or more arteries in a patient with NSTEMI or unstable angina with Stabilization after presentation Intermediate risk for clinical events  A (7) Indication: 16; Score 7    [place]ec[place]

## 2018-04-01 NOTE — Progress Notes (Signed)
Attempted IV in left AC and left hand, unable to obtain, Pt has working IV in right upper forearm.

## 2018-04-01 NOTE — Progress Notes (Signed)
Mild nonobstructive CAD on cath. Okay to discharge today. Patient will pick up Bidil samples from our office.   Nigel Mormon, MD [facility] [place] Pager: [phone] Office: [phone] If no answer Cell [phone]

## 2018-04-01 NOTE — Discharge Instructions (Signed)
Drink plenty of fluids over next 48 hours and keep right wrist elevated at heart level for 24 hours  Radial Site Care Refer to this sheet in the next few weeks. These instructions provide you with information about caring for yourself after your procedure. Your health care provider may also give you more specific instructions. Your treatment has been planned according to current medical practices, but problems sometimes occur. Call your health care provider if you have any problems or questions after your procedure. What can I expect after the procedure? After your procedure, it is typical to have the following:  Bruising at the radial site that usually fades within 1-2 weeks.  Blood collecting in the tissue (hematoma) that may be painful to the touch. It should usually decrease in size and tenderness within 1-2 weeks.  Follow these instructions at home:  Take medicines only as directed by your health care provider.  You may shower 24-48 hours after the procedure or as directed by your health care provider. Remove the bandage (dressing) and gently wash the site with plain soap and water. Pat the area dry with a clean towel. Do not rub the site, because this may cause bleeding.  Do not take baths, swim, or use a hot tub until your health care provider approves.  Check your insertion site every day for redness, swelling, or drainage.  Do not apply powder or lotion to the site.  Do not flex or bend the affected arm for 24 hours or as directed by your health care provider.  Do not push or pull heavy objects with the affected arm for 24 hours or as directed by your health care provider.  Do not lift over 10 lb (4.5 kg) for 5 days after your procedure or as directed by your health care provider.  Ask your health care provider when it is okay to: ? Return to work or school. ? Resume usual physical activities or sports. ? Resume sexual activity.  Do not drive home if you are discharged the  same day as the procedure. Have someone else drive you.  You may drive 24 hours after the procedure unless otherwise instructed by your health care provider.  Do not operate machinery or power tools for 24 hours after the procedure.  If your procedure was done as an outpatient procedure, which means that you went home the same day as your procedure, a responsible adult should be with you for the first 24 hours after you arrive home.  Keep all follow-up visits as directed by your health care provider. This is important. Contact a health care provider if:  You have a fever.  You have chills.  You have increased bleeding from the radial site. Hold pressure on the site. Get help right away if:  You have unusual pain at the radial site.  You have redness, warmth, or swelling at the radial site.  You have drainage (other than a small amount of blood on the dressing) from the radial site.  The radial site is bleeding, and the bleeding does not stop after 30 minutes of holding steady pressure on the site.  Your arm or hand becomes pale, cool, tingly, or numb. This information is not intended to replace advice given to you by your health care provider. Make sure you discuss any questions you have with your health care provider. Document Released: 06/29/2010 Document Revised: 11/02/2015 Document Reviewed: 12/13/2013 Elsevier Interactive Patient Education  2018 [place]. Femoral Site Care Refer to this  sheet in the next few weeks. These instructions provide you with information about caring for yourself after your procedure. Your health care provider may also give you more specific instructions. Your treatment has been planned according to current medical practices, but problems sometimes occur. Call your health care provider if you have any problems or questions after your procedure. What can I expect after the procedure? After your procedure, it is typical to have the  following:  Bruising at the site that usually fades within 1-2 weeks.  Blood collecting in the tissue (hematoma) that may be painful to the touch. It should usually decrease in size and tenderness within 1-2 weeks.  Follow these instructions at home:  Take medicines only as directed by your health care provider.  You may shower 24-48 hours after the procedure or as directed by your health care provider. Remove the bandage (dressing) and gently wash the site with plain soap and water. Pat the area dry with a clean towel. Do not rub the site, because this may cause bleeding.  Do not take baths, swim, or use a hot tub until your health care provider approves.  Check your insertion site every day for redness, swelling, or drainage.  Do not apply powder or lotion to the site.  Limit use of stairs to twice a day for the first 2-3 days or as directed by your health care provider.  Do not squat for the first 2-3 days or as directed by your health care provider.  Do not lift over 10 lb (4.5 kg) for 5 days after your procedure or as directed by your health care provider.  Ask your health care provider when it is okay to: ? Return to work or school. ? Resume usual physical activities or sports. ? Resume sexual activity.  Do not drive home if you are discharged the same day as the procedure. Have someone else drive you.  You may drive 24 hours after the procedure unless otherwise instructed by your health care provider.  Do not operate machinery or power tools for 24 hours after the procedure or as directed by your health care provider.  If your procedure was done as an outpatient procedure, which means that you went home the same day as your procedure, a responsible adult should be with you for the first 24 hours after you arrive home.  Keep all follow-up visits as directed by your health care provider. This is important. Contact a health care provider if:  You have a fever.  You have  chills.  You have increased bleeding from the site. Hold pressure on the site. Get help right away if:  You have unusual pain at the site.  You have redness, warmth, or swelling at the site.  You have drainage (other than a small amount of blood on the dressing) from the site.  The site is bleeding, and the bleeding does not stop after 30 minutes of holding steady pressure on the site.  Your leg or foot becomes pale, cool, tingly, or numb. This information is not intended to replace advice given to you by your health care provider. Make sure you discuss any questions you have with your health care provider. Document Released: 01/28/2014 Document Revised: 11/02/2015 Document Reviewed: 12/14/2013 Elsevier Interactive Patient Education  [employer].

## 2018-04-02 ENCOUNTER — Encounter (HOSPITAL_COMMUNITY): Payer: Self-pay | Admitting: Cardiology

## 2018-04-03 DIAGNOSIS — I10 Essential (primary) hypertension: Secondary | ICD-10-CM | POA: Diagnosis not present

## 2018-04-03 DIAGNOSIS — E119 Type 2 diabetes mellitus without complications: Secondary | ICD-10-CM | POA: Diagnosis not present

## 2018-04-03 DIAGNOSIS — I2510 Atherosclerotic heart disease of native coronary artery without angina pectoris: Secondary | ICD-10-CM | POA: Diagnosis not present

## 2018-04-03 DIAGNOSIS — E782 Mixed hyperlipidemia: Secondary | ICD-10-CM | POA: Diagnosis not present

## 2018-04-06 DIAGNOSIS — R82998 Other abnormal findings in urine: Secondary | ICD-10-CM | POA: Diagnosis not present

## 2018-04-06 DIAGNOSIS — M810 Age-related osteoporosis without current pathological fracture: Secondary | ICD-10-CM | POA: Diagnosis not present

## 2018-04-06 DIAGNOSIS — E119 Type 2 diabetes mellitus without complications: Secondary | ICD-10-CM | POA: Diagnosis not present

## 2018-04-06 DIAGNOSIS — E7849 Other hyperlipidemia: Secondary | ICD-10-CM | POA: Diagnosis not present

## 2018-04-09 ENCOUNTER — Ambulatory Visit: Payer: Medicare Other | Admitting: Orthotics

## 2018-04-09 DIAGNOSIS — Q828 Other specified congenital malformations of skin: Secondary | ICD-10-CM

## 2018-04-09 DIAGNOSIS — B351 Tinea unguium: Secondary | ICD-10-CM

## 2018-04-09 DIAGNOSIS — E1142 Type 2 diabetes mellitus with diabetic polyneuropathy: Secondary | ICD-10-CM

## 2018-04-09 DIAGNOSIS — M79674 Pain in right toe(s): Principal | ICD-10-CM

## 2018-04-09 DIAGNOSIS — L84 Corns and callosities: Secondary | ICD-10-CM

## 2018-04-09 DIAGNOSIS — M79675 Pain in left toe(s): Principal | ICD-10-CM

## 2018-04-09 NOTE — Progress Notes (Signed)
Patient picked up DBS that was more to her liking.  She is pleased with fit and selection.

## 2018-04-13 DIAGNOSIS — R001 Bradycardia, unspecified: Secondary | ICD-10-CM | POA: Diagnosis not present

## 2018-04-13 DIAGNOSIS — E7849 Other hyperlipidemia: Secondary | ICD-10-CM | POA: Diagnosis not present

## 2018-04-13 DIAGNOSIS — I6529 Occlusion and stenosis of unspecified carotid artery: Secondary | ICD-10-CM | POA: Diagnosis not present

## 2018-04-13 DIAGNOSIS — Z6823 Body mass index (BMI) 23.0-23.9, adult: Secondary | ICD-10-CM | POA: Diagnosis not present

## 2018-04-13 DIAGNOSIS — J45909 Unspecified asthma, uncomplicated: Secondary | ICD-10-CM | POA: Diagnosis not present

## 2018-04-13 DIAGNOSIS — I700 Atherosclerosis of aorta: Secondary | ICD-10-CM | POA: Diagnosis not present

## 2018-04-13 DIAGNOSIS — Z1389 Encounter for screening for other disorder: Secondary | ICD-10-CM | POA: Diagnosis not present

## 2018-04-13 DIAGNOSIS — I2510 Atherosclerotic heart disease of native coronary artery without angina pectoris: Secondary | ICD-10-CM | POA: Diagnosis not present

## 2018-04-13 DIAGNOSIS — D692 Other nonthrombocytopenic purpura: Secondary | ICD-10-CM | POA: Diagnosis not present

## 2018-04-13 DIAGNOSIS — I10 Essential (primary) hypertension: Secondary | ICD-10-CM | POA: Diagnosis not present

## 2018-04-13 DIAGNOSIS — E119 Type 2 diabetes mellitus without complications: Secondary | ICD-10-CM | POA: Diagnosis not present

## 2018-04-13 DIAGNOSIS — Z0000 Encounter for general adult medical examination without abnormal findings: Secondary | ICD-10-CM | POA: Diagnosis not present

## 2018-04-16 DIAGNOSIS — E119 Type 2 diabetes mellitus without complications: Secondary | ICD-10-CM | POA: Diagnosis not present

## 2018-04-16 DIAGNOSIS — H40023 Open angle with borderline findings, high risk, bilateral: Secondary | ICD-10-CM | POA: Diagnosis not present

## 2018-04-16 DIAGNOSIS — H35033 Hypertensive retinopathy, bilateral: Secondary | ICD-10-CM | POA: Diagnosis not present

## 2018-04-16 DIAGNOSIS — H26492 Other secondary cataract, left eye: Secondary | ICD-10-CM | POA: Diagnosis not present

## 2018-04-20 ENCOUNTER — Telehealth: Payer: Self-pay

## 2018-04-20 ENCOUNTER — Institutional Professional Consult (permissible substitution): Payer: Medicare Other | Admitting: Neurology

## 2018-04-20 NOTE — Telephone Encounter (Signed)
Pt did not show for their appt with Dr. Athar today.  

## 2018-04-21 ENCOUNTER — Encounter: Payer: Self-pay | Admitting: Neurology

## 2018-04-21 DIAGNOSIS — I10 Essential (primary) hypertension: Secondary | ICD-10-CM | POA: Diagnosis not present

## 2018-04-21 DIAGNOSIS — E119 Type 2 diabetes mellitus without complications: Secondary | ICD-10-CM | POA: Diagnosis not present

## 2018-04-30 ENCOUNTER — Ambulatory Visit
Admission: RE | Admit: 2018-04-30 | Discharge: 2018-04-30 | Disposition: A | Discharge status: Home / Self Care | Payer: Medicare Other | Source: Ambulatory Visit | Attending: Internal Medicine | Admitting: Internal Medicine

## 2018-04-30 DIAGNOSIS — Z1231 Encounter for screening mammogram for malignant neoplasm of breast: Secondary | ICD-10-CM | POA: Diagnosis not present

## 2018-04-30 DIAGNOSIS — L574 Cutis laxa senilis: Secondary | ICD-10-CM | POA: Diagnosis not present

## 2018-04-30 DIAGNOSIS — L819 Disorder of pigmentation, unspecified: Secondary | ICD-10-CM | POA: Diagnosis not present

## 2018-05-19 ENCOUNTER — Other Ambulatory Visit: Payer: Self-pay | Admitting: Internal Medicine

## 2018-05-19 DIAGNOSIS — I10 Essential (primary) hypertension: Secondary | ICD-10-CM | POA: Diagnosis not present

## 2018-05-19 DIAGNOSIS — M8588 Other specified disorders of bone density and structure, other site: Secondary | ICD-10-CM | POA: Diagnosis not present

## 2018-05-19 DIAGNOSIS — R922 Inconclusive mammogram: Secondary | ICD-10-CM | POA: Diagnosis not present

## 2018-06-05 ENCOUNTER — Ambulatory Visit (INDEPENDENT_AMBULATORY_CARE_PROVIDER_SITE_OTHER): Payer: Medicare Other | Admitting: Podiatry

## 2018-06-05 DIAGNOSIS — E1142 Type 2 diabetes mellitus with diabetic polyneuropathy: Secondary | ICD-10-CM | POA: Diagnosis not present

## 2018-06-05 DIAGNOSIS — M79674 Pain in right toe(s): Secondary | ICD-10-CM

## 2018-06-05 DIAGNOSIS — L84 Corns and callosities: Secondary | ICD-10-CM | POA: Diagnosis not present

## 2018-06-05 DIAGNOSIS — M79675 Pain in left toe(s): Secondary | ICD-10-CM | POA: Diagnosis not present

## 2018-06-05 DIAGNOSIS — B351 Tinea unguium: Secondary | ICD-10-CM

## 2018-06-05 NOTE — Patient Instructions (Signed)

## 2018-06-08 ENCOUNTER — Other Ambulatory Visit: Payer: Self-pay

## 2018-06-08 ENCOUNTER — Encounter (HOSPITAL_COMMUNITY): Payer: Self-pay | Admitting: Emergency Medicine

## 2018-06-08 ENCOUNTER — Emergency Department (HOSPITAL_COMMUNITY)
Admission: EM | Admit: 2018-06-08 | Discharge: 2018-06-08 | Disposition: A | Discharge status: Home / Self Care | Payer: Medicare Other | Attending: Emergency Medicine | Admitting: Emergency Medicine

## 2018-06-08 DIAGNOSIS — R001 Bradycardia, unspecified: Secondary | ICD-10-CM | POA: Diagnosis not present

## 2018-06-08 DIAGNOSIS — Z79899 Other long term (current) drug therapy: Secondary | ICD-10-CM | POA: Insufficient documentation

## 2018-06-08 DIAGNOSIS — Z7982 Long term (current) use of aspirin: Secondary | ICD-10-CM | POA: Insufficient documentation

## 2018-06-08 DIAGNOSIS — R05 Cough: Secondary | ICD-10-CM | POA: Diagnosis present

## 2018-06-08 DIAGNOSIS — J45909 Unspecified asthma, uncomplicated: Secondary | ICD-10-CM | POA: Diagnosis not present

## 2018-06-08 DIAGNOSIS — E119 Type 2 diabetes mellitus without complications: Secondary | ICD-10-CM | POA: Insufficient documentation

## 2018-06-08 DIAGNOSIS — I10 Essential (primary) hypertension: Secondary | ICD-10-CM | POA: Insufficient documentation

## 2018-06-08 DIAGNOSIS — R9431 Abnormal electrocardiogram [ECG] [EKG]: Secondary | ICD-10-CM | POA: Diagnosis not present

## 2018-06-08 LAB — BASIC METABOLIC PANEL
ANION GAP: 10 (ref 5–15)
BUN: 19 mg/dL (ref 8–23)
CALCIUM: 9.7 mg/dL (ref 8.9–10.3)
CO2: 27 mmol/L (ref 22–32)
CREATININE: 0.87 mg/dL (ref 0.44–1.00)
Chloride: 102 mmol/L (ref 98–111)
GFR calc Af Amer: >60 mL/min (ref >60)
GFR calc non Af Amer: >60 mL/min (ref >60)
GLUCOSE: 97 mg/dL (ref 70–99)
Potassium: 4.3 mmol/L (ref 3.5–5.1)
Sodium: 139 mmol/L (ref 135–145)

## 2018-06-08 LAB — CBC
HCT: 41 % (ref 36.0–46.0)
Hemoglobin: 13 g/dL (ref 12.0–15.0)
MCH: 28.9 pg (ref 26.0–34.0)
MCHC: 31.7 g/dL (ref 30.0–36.0)
MCV: 91.1 fL (ref 80.0–100.0)
PLATELETS: 236 10*3/uL (ref 150–400)
RBC: 4.5 MIL/uL (ref 3.87–5.11)
RDW: 13 % (ref 11.5–15.5)
WBC: 5.6 10*3/uL (ref 4.0–10.5)
nRBC: 0 % (ref 0.0–0.2)

## 2018-06-08 LAB — I-STAT TROPONIN, ED: Troponin i, poc: 0 ng/mL (ref 0.00–0.08)

## 2018-06-08 NOTE — ED Triage Notes (Signed)
Pt BIB [facility] from the dentist, BP at office was 200/98, EMS BP 158/88. Pt has hx of HTN, medication change in October, pt reports that her BP has been fluctuating. Pt c/o left arm pain and left face tingling x 1 day.

## 2018-06-08 NOTE — ED Provider Notes (Signed)
77 year old female history of hypertension presents today with complaints of hypertension.  She was seen at her dentist office today and noted to have a blood pressure of 200/96.  He states she was feeling somewhat poorly prior to this.  She denies eyes deficits.  She has had some changes in her antihypertensives recently as her blood pressure was too low. Vitals:   06/08/18 1631 06/08/18 2026  BP: (!) 156/85 (!) 157/76  Pulse: (!) 59 (!) 57  Resp: 16 16  Temp: 97.9 F (36.6 C)   SpO2: 97% 97%   WDWN female nad BP 156/85 Lungs cta cv-rrr  I saw and evaluated the patient, reviewed the resident's note and I agree with the findings and plan.  EKG: EKG Interpretation  Date/Time:  Monday June 08 2018 16:39:55 EST Ventricular Rate:  57 PR Interval:  174 QRS Duration: 88 QT Interval:  406 QTC Calculation: 395 R Axis:   -37 Text Interpretation:  Sinus bradycardia Left axis deviation Low voltage QRS Cannot rule out Anterior infarct , age undetermined Abnormal ECG Confirmed by Pattricia Boss [phone]) on 06/08/2018 9:02:05 PM       Pattricia Boss, MD 06/08/18 2148

## 2018-06-08 NOTE — ED Provider Notes (Signed)
[facility] Provider Note   CSN: [id number] Arrival date & time: 06/08/18  1629     History   Chief Complaint No chief complaint on file.   HPI Sara Brown is a 77 y.o. female.  HPI   Patient is a 77 year old female with a history of hypertension and hyperlipidemia diabetes and GERD who presents for evaluation from her dentist office after she was evaluated for routine procedure and found to have a blood pressure of 200/98.  Patient states she has been compliant with her blood pressure medicines including by drill which she took at 8 AM and around noon today.  She states she is currently due to take her clonidine which she takes in the evening.  She states she has not had any vomiting, nausea, chest pain, shortness of breath, changes to her vision, vertigo, neck pain, abdominal pain, diarrhea, dysuria, focal extremity weakness, paresthesias, or other acute complaints.  She does note that last night she had some tingling in her tongue and the left side of her face as well as some paresthesias in her left left leg that have all since resolved.  She endorses prior similar episodes "when I know my blood pressure is high".  She denies any other alleviating or aggravating factors.  She does note a chronic cough which she states she has had for at least several months and she states it is secondary to postnasal drip.  Denies any recent caffeine use, changes to her medications, or other acute concerns.  Past Medical History:  Diagnosis Date  . Allergic rhinitis   . Arthritis    "fingers, knees" (02/09/2018)  . Asthma    "stopped taking RX after dr said I don't have this" (02/09/2018)  . Carotid stenosis    ICA(L)  . Complication of anesthesia    "felt the cut w/one of my c-sections" (02/09/2018)  . Constipation   . GERD (gastroesophageal reflux disease)   . Headache    "years ago; before menopause" (02/09/2018)  . Hyperlipidemia   . Hypertension   .  Neuropathy    face (R)  . OA (osteoarthritis)   . Osteoporosis   . TIA (transient ischemic attack) ?2005  . Type II diabetes mellitus ([place])   . Vitamin D deficiency     Patient Active Problem List   Diagnosis Date Noted  . Diabetes mellitus type 2 in nonobese ([place]) 03/31/2018  . Essential hypertension 03/31/2018  . Dyslipidemia 03/31/2018  . Chest pain 02/10/2018  . Chest pain due to coronary artery disease ([place]) 02/09/2018  . Angina at rest [facility]) 02/09/2018  . Bradycardia 02/09/2018  . Skin laxity 06/12/2017  . Hirsutism 06/12/2017  . Hyperpigmentation 02/13/2017  . Neck mass 01/17/2017  . 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 Ear pain, left 02/21/2016  . GERD (gastroesophageal reflux disease)   . Shortness of breath 07/30/2007  . Cough 07/30/2007    Past Surgical History:  Procedure Laterality Date  . CATARACT EXTRACTION W/ INTRAOCULAR LENS  IMPLANT, BILATERAL Bilateral   . CESAREAN SECTION  1971; 1975  . INTRAVASCULAR PRESSURE WIRE/FFR STUDY N/A 04/01/2018   Procedure: INTRAVASCULAR PRESSURE WIRE/FFR STUDY;  Surgeon: Nigel Mormon, MD;  Location: [facility];  Service: Cardiovascular;  Laterality: N/A;  . LEFT HEART CATH AND CORONARY ANGIOGRAPHY N/A 04/01/2018   Procedure: LEFT HEART CATH AND CORONARY ANGIOGRAPHY;  Surgeon: Nigel Mormon, MD;  Location: [facility];  Service: Cardiovascular;  Laterality: N/A;  . TUBAL LIGATION  1975     OB History   No obstetric history on file.      Home Medications    Prior to Admission medications   Medication Sig Start Date End Date Taking? Authorizing Provider  aspirin EC 81 MG tablet Take 81 mg by mouth daily.    [provider]  carboxymethylcellulose (REFRESH PLUS) 0.5 % SOLN Place 1-2 drops into both eyes 3 (three) times daily.     [provider]  Cholecalciferol (VITAMIN D3 PO) Take 2 capsules by mouth daily.      [provider]  cloNIDine (CATAPRES) 0.1 MG tablet Take 0.1 mg by mouth at bedtime.  10/01/17   [provider]  cycloSPORINE (RESTASIS) 0.05 % ophthalmic emulsion Place 1 drop into both eyes 2 (two) times daily.     [provider]  Emollient (CERAVE EX) Apply 1 application topically See admin instructions. Apply to affected areas daily as directed or as needed for dryness    [provider]  fluticasone (FLONASE) 50 MCG/ACT nasal spray Place 1 spray into both nostrils at bedtime as needed for allergies or rhinitis.    [provider]  hydroquinone 4 % cream Apply to face nightly 04/30/18   [provider]  isosorbide-hydrALAZINE (BIDIL) 20-37.5 MG tablet Take by mouth.    [provider]  KRILL OIL PO Take 1 capsule by mouth daily.    [provider]  Multiple Vitamins-Minerals (MULTIVITAMIN WITH MINERALS) tablet Take 1 tablet by mouth daily. Centrum for Women    [provider]  omeprazole (PRILOSEC OTC) 20 MG tablet Take 20 mg by mouth daily as needed (for reflux).     [provider]  Pitavastatin Calcium (LIVALO) 1 MG TABS Take 1 mg by mouth daily.    [provider]  polyethylene glycol (MIRALAX / GLYCOLAX) packet Take 17 g by mouth at bedtime.     [provider]  pyridOXINE (VITAMIN B-6) 100 MG tablet Take 100 mg by mouth daily.     [provider]  spironolactone (ALDACTONE) 50 MG tablet Take 50 mg by mouth daily.     [provider]  triamcinolone cream (KENALOG) 0.1 % Apply 1 application topically 2 (two) times daily as needed (rash/ eczema).  11/01/14   [provider]  vitamin B-12 (CYANOCOBALAMIN) 1000 MCG tablet Take 1,000 mcg by mouth daily.     [provider]  vitamin E 100 UNIT capsule Take 100 Units by mouth daily.     [provider]    Family History Family History  Problem Relation Age of Onset  . Diabetes Mellitus II  Mother   . Osteoporosis Mother   . Hypertension Mother   . Diabetes Mellitus II Father   . CVA Sister        4  . CAD Sister        4  . Hypertension Sister        4  . Osteoarthritis Sister        4  . Diabetes Mellitus II Sister        4  . Dementia Sister        decreased  . Alcoholism Brother   . Breast cancer Cousin 51    Social History Social History   Tobacco Use  . Smoking status: Never Smoker  . Smokeless tobacco: Never Used  Substance Use Topics  .  Alcohol use: Never    Frequency: Never  . Drug use: Never     Allergies   Amlodipine; Naproxen; Prednisone; and Losartan   Review of Systems Review of Systems  Constitutional: Negative for chills and fever.  HENT: Negative for ear pain and sore throat.   Eyes: Negative for pain and visual disturbance.  Respiratory: Positive for cough ( chronic). Negative for shortness of breath.   Cardiovascular: Negative for chest pain and palpitations.  Gastrointestinal: Negative for abdominal pain and vomiting.  Genitourinary: Negative for dysuria and hematuria.  Musculoskeletal: Negative for arthralgias and back pain.  Skin: Negative for color change and rash.  Neurological: Negative for seizures and syncope.  All other systems reviewed and are negative.    Physical Exam Updated Vital Signs BP (!) 157/76 (BP Location: Right Arm)   Pulse (!) 57   Temp 97.9 F (36.6 C) (Oral)   Resp 16   SpO2 97%   Physical Exam Vitals signs and nursing note reviewed.  Constitutional:      General: She is not in acute distress.    Appearance: She is well-developed.  HENT:     Head: Normocephalic and atraumatic.     Right Ear: External ear normal.     Left Ear: External ear normal.     Nose: Nose normal.     Mouth/Throat:     Mouth: Mucous membranes are moist.  Eyes:     Conjunctiva/sclera: Conjunctivae normal.  Neck:     Musculoskeletal: Neck supple.  Cardiovascular:     Rate and Rhythm: Normal rate and regular  rhythm.     Heart sounds: No murmur.  Pulmonary:     Effort: Pulmonary effort is normal. No respiratory distress.     Breath sounds: Normal breath sounds.  Abdominal:     Palpations: Abdomen is soft.     Tenderness: There is no abdominal tenderness.  Skin:    General: Skin is warm and dry.  Neurological:     General: No focal deficit present.     Mental Status: She is alert and oriented to person, place, and time.     Cranial Nerves: No cranial nerve deficit.     Sensory: No sensory deficit.     Motor: No weakness.     Coordination: Coordination normal.      ED Treatments / Results  Labs (all labs ordered are listed, but only abnormal results are displayed) Labs Reviewed  BASIC METABOLIC PANEL  CBC  I-STAT TROPONIN, ED    EKG EKG Interpretation  Date/Time:  Monday June 08 2018 16:39:55 EST Ventricular Rate:  57 PR Interval:  174 QRS Duration: 88 QT Interval:  406 QTC Calculation: 395 R Axis:   -37 Text Interpretation:  Sinus bradycardia Left axis deviation Low voltage QRS Cannot rule out Anterior infarct , age undetermined Abnormal ECG Confirmed by Pattricia Boss [phone]) on 06/08/2018 9:02:05 PM   Radiology No results found.  Procedures Procedures (including critical care time)  Medications Ordered in ED Medications - No data to display   Initial Impression / Assessment and Plan / ED Course  I have reviewed the triage vital signs and the nursing notes.  Pertinent labs & imaging results that were available during my care of the patient were reviewed by me and considered in my medical decision making (see chart for details).     Patient is an 77 year old female who presents with above-stated history exam.  On presentation patient is afebrile stable vital signs.  Exam  as above.  EKG shows sinus bradycardia with ventricular rate of 57, and no other signs of acute ischemic change.  In addition patient is noted to have 2 blood pressures measured of 156/85 and  157/76.  In addition patient notes her symptoms that she describes of her left tongue paresthesias have resolved on presentation to the emergency department.  Given absence of chest pain, absence of acute findings of ischemia on EKG and troponin of 0.00 I have a low suspicion for ACS.  History exam is not consistent with aortic dissection, PE, pneumonia, sepsis, meningitis, or other imminently life-threatening etiology.  History exam is not consistent with CVA.  Impression is hypertensive urgency earlier today which has since resolved.  CBC and BMP are WNL.  Patient discharged in stable condition.  Stroke precautions advised and discussed.  Instructed to follow-up with her PCP in 2 to 3 days.  Patient voiced understanding and agreement with this plan.  Final Clinical Impressions(s) / ED Diagnoses   Final diagnoses:  Hypertension, unspecified type    ED Discharge Orders    None       Hulan Saas, MD 06/08/18 2212    Pattricia Boss, MD 06/08/18 [phone]

## 2018-06-08 NOTE — ED Notes (Signed)
Patient verbalizes understanding of discharge instructions. Opportunity for questioning and answers were provided. Armband removed by staff, pt discharged from ED.  

## 2018-06-09 DIAGNOSIS — E782 Mixed hyperlipidemia: Secondary | ICD-10-CM | POA: Diagnosis not present

## 2018-06-09 DIAGNOSIS — I2510 Atherosclerotic heart disease of native coronary artery without angina pectoris: Secondary | ICD-10-CM | POA: Diagnosis not present

## 2018-06-09 DIAGNOSIS — E119 Type 2 diabetes mellitus without complications: Secondary | ICD-10-CM | POA: Diagnosis not present

## 2018-06-09 DIAGNOSIS — I10 Essential (primary) hypertension: Secondary | ICD-10-CM | POA: Diagnosis not present

## 2018-06-11 ENCOUNTER — Ambulatory Visit (INDEPENDENT_AMBULATORY_CARE_PROVIDER_SITE_OTHER): Payer: Medicare Other | Admitting: Neurology

## 2018-06-11 ENCOUNTER — Encounter

## 2018-06-11 ENCOUNTER — Encounter: Payer: Self-pay | Admitting: Neurology

## 2018-06-11 VITALS — BP 152/82 | HR 77 | Ht 66.0 in | Wt 150.0 lb

## 2018-06-11 DIAGNOSIS — R030 Elevated blood-pressure reading, without diagnosis of hypertension: Secondary | ICD-10-CM

## 2018-06-11 DIAGNOSIS — R202 Paresthesia of skin: Secondary | ICD-10-CM | POA: Diagnosis not present

## 2018-06-11 NOTE — Patient Instructions (Signed)
As discussed, your blood pressure elevation may be causing your intermittent tingling in the face and tongue and arm.  I do not see any telltale signs of weakness currently. You had 2 recent CT scans of the head.  I will request a brain MRI to look for a sign of a recent stroke.  You should follow up with your cardiologist for your blood pressure management. We will call you with the MRI results. If the findings are age appropriate and benign, I will see you back as needed.

## 2018-06-11 NOTE — Progress Notes (Signed)
Subjective:    Patient ID: Sara Brown is a 78 y.o. female.  HPI     Star Age, MD, PhD [facility] [address], [address] [place], [place] [address]  I saw Ms. Linna Darner as a referral from the emergency room for paresthesias. The patient is unaccompanied today. Of note, she missed an appointment on 04/20/2018. 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 She was seen in the emergency room on 02/09/2018 and reported facial paresthesias and discomfort. She had a head CT without contrast on 02/09/2018 and I reviewed the results: IMPRESSION: No acute abnormality.   Chronic microvascular ischemic change.  Of note, she had interim emergency room visits and hospitalization for chest pain.  She has had elevated BP values, she sees cardiology for this. She went to the ER in [place] on 01/07/18 for HTN, she had a CTH wo contrast at the time, which was neg.  She reports intermittent tingling sensation affecting the left arm and sometimes the left side of her face, sometimes affecting her tongue.   She reports intermittent pain in the left arm, of note, when she was seen in the emergency room she had right-sided symptoms.  Previously:   10/17/2014: She reports no new symptoms. She feels that sometimes she has a drawing sensation at the left side of her mouth. She has no one-sided numbness or tingling or weakness. She has improved symptoms on the right side. She had recent blood work with her primary care physician last month. We will request test results from his PCP.   I first met her on 07/15/2014 at the request of her primary care physician, at which time she reported a several year  history of intermittent abnormal sensations in her right face, also affecting her right arm with pulling sensation in the right arm and shoulders as well as tingling noted. I suggested further evaluation with an EEG and MRI brain. We called her with all her test results and also she was seen in the interim by her nurse practitioner, Ms. Clabe Seal on 08/30/2014 at which time we discussed all her test results as well and I also saw her at the time. She felt she may have a facial droop on the left. On examination, she did not have any obvious facial droop on either side. Her exam was nonfocal.    She had an EEG on 07/26/2014 which was reported as normal in the awake state. 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 No enhancing lesions are noted. No significant change compared with CT head dated 05/27/2014. In addition, I personally reviewed the images through the PACS system and agree with the findings.  07/15/2014: (She) has a several year history of intermittent abnormal sensations in her right face and R arm, with pulling sensation in the right arm and shoulder with tingling noted. She was told she may have a right facial droop when she saw physical therapy.  She is currently in outpatient physical therapy. She has had intermittent problems with abnormal sensation in her right face and arm for years. She may go days without symptoms. She wonders if this is a stress-related reaction. She was  rushing to get to this appointment and while driving she had a sensation of tingling that started in her right mid face and went to her right shoulder. She continues to have very mild symptoms currently. Very occasionally will she have symptoms on the left side. She has no left or right lower body symptoms. Strength-wise sometimes she feels a little weak during the episode which can  last minutes to hours. It is usually not just seconds. She does not lose consciousness or awareness. She has never noted any abnormal involuntary twitching. She has not noted any muscle wasting. Symptoms started years ago. She had a recent head CT and remote MRI brain. She never had an EEG. There is no overt triggers or alleviating factors. She does not have any associated headaches with this but certainly does report occasional left parietal headaches which are not debilitating.   She had a CT head without contrast on 05/27/2014: 1. No acute intracranial findings. 2. Mild microvascular disease. In addition, personally reviewed the images through the PACS system.   She had 2D echo on 10/18/13: this was unremarkable with EF 60-65%.    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 No other significant ischemic changes are noted. The enhancement pattern is normal and there is no mass lesion. No intracranial hemorrhage is identified.   In addition, personally reviewed the images through the PACS system. She had a MRA head without contrast and MRA neck with contrast on 03/09/2004 because of dizziness reported: MR angiography of the intracranial circulation shows no significant proximal stenosis or intracranial atherosclerotic change.   Advanced dolichoectasia of the craniocervical vessels without significant proximal stenosis. She had a BMP on 06/15/2014: This was unremarkable. Her Past Medical History Is Significant For: Past Medical History:  Diagnosis Date  . Allergic rhinitis   . Arthritis    "fingers, knees" (02/09/2018)  . Asthma    "stopped taking RX after dr said I don't have this" (02/09/2018)  . Carotid stenosis    ICA(L)  . Complication of anesthesia    "felt the cut w/one of my c-sections" (02/09/2018)  . Constipation   . GERD (gastroesophageal reflux disease)   . Headache     "years ago; before menopause" (02/09/2018)  . Hyperlipidemia   . Hypertension   . Neuropathy    face (R)  . OA (osteoarthritis)   . Osteoporosis   . TIA (transient ischemic attack) ?2005  . Type II diabetes mellitus ([place])   . Vitamin D deficiency     Her Past Surgical History Is Significant For: Past Surgical History:  Procedure Laterality Date  . CATARACT EXTRACTION W/ INTRAOCULAR LENS  IMPLANT, BILATERAL Bilateral   . CESAREAN SECTION  1971; 1975  . INTRAVASCULAR PRESSURE WIRE/FFR STUDY N/A 04/01/2018   Procedure: INTRAVASCULAR PRESSURE WIRE/FFR STUDY;  Surgeon: Nigel Mormon, MD;  Location: [facility];  Service: Cardiovascular;  Laterality: N/A;  . LEFT HEART CATH AND CORONARY ANGIOGRAPHY N/A 04/01/2018   Procedure: LEFT HEART CATH AND CORONARY ANGIOGRAPHY;  Surgeon: Nigel Mormon, MD;  Location: [facility];  Service: Cardiovascular;  Laterality: N/A;  . TUBAL LIGATION  1975    Her Family History Is Significant For: Family History  Problem Relation Age of Onset  . Diabetes Mellitus II Mother   . Osteoporosis Mother   . Hypertension Mother   . Diabetes Mellitus  II Father   . CVA Sister        4  . CAD Sister        4  . Hypertension Sister        4  . Osteoarthritis Sister        4  . Diabetes Mellitus II Sister        4  . Dementia Sister        decreased  . Alcoholism Brother   . Breast cancer Cousin 75    Her Social History Is Significant For: Social History   Socioeconomic History  . Marital status: Married    Spouse name: Bland Span  . Number of children: 2  . Years of education: 16  . Highest education level: Bachelor's degree (e.g., BA, AB, BS)  Occupational History    Comment: employed at home  Social Needs  . Financial resource strain: Not hard at all  . Food insecurity:    Worry: Never true    Inability: Never true  . Transportation needs:    Medical: No    Non-medical: No  Tobacco Use  . Smoking status: Never  Smoker  . Smokeless tobacco: Never Used  Substance and Sexual Activity  . Alcohol use: Never    Frequency: Never  . Drug use: Never  . Sexual activity: Not Currently  Lifestyle  . Physical activity:    Days per week: 1 day    Minutes per session: 30 min  . Stress: Not at all  Relationships  . Social connections:    Talks on phone: More than three times a week    Gets together: More than three times a week    Attends religious service: 1 to 4 times per year    Active member of club or organization: No    Attends meetings of clubs or organizations: Never    Relationship status: Married  Other Topics Concern  . Not on file  Social History Narrative   Consumes no caffeine    Her Allergies Are:  Allergies  Allergen Reactions  . Amlodipine Hives  . Naproxen Other (See Comments)    Patient preference   . Prednisone Other (See Comments)    Patient Preference   . Losartan Rash  :   Her Current Medications Are:  Outpatient Encounter Medications as of 06/11/2018  Medication Sig  . aspirin EC 81 MG tablet Take 81 mg by mouth daily.  . carboxymethylcellulose (REFRESH PLUS) 0.5 % SOLN Place 1-2 drops into both eyes 3 (three) times daily.   . Cholecalciferol (VITAMIN D3 PO) Take 2 capsules by mouth daily.   . cloNIDine (CATAPRES) 0.1 MG tablet Take 0.1 mg by mouth at bedtime.   . cycloSPORINE (RESTASIS) 0.05 % ophthalmic emulsion Place 1 drop into both eyes 2 (two) times daily.   . Emollient (CERAVE EX) Apply 1 application topically See admin instructions. Apply to affected areas daily as directed or as needed for dryness  . fluticasone (FLONASE) 50 MCG/ACT nasal spray Place 1 spray into both nostrils at bedtime as needed for allergies or rhinitis.  . hydroquinone 4 % cream Apply to face nightly  . isosorbide-hydrALAZINE (BIDIL) 20-37.5 MG tablet Take by mouth.  Marland Kitchen KRILL OIL PO Take 1 capsule by mouth daily.  . Multiple Vitamins-Minerals (MULTIVITAMIN WITH MINERALS) tablet Take 1  tablet by mouth daily. Centrum for Women  . omeprazole (PRILOSEC OTC) 20 MG tablet Take 20 mg by mouth daily as needed (for reflux).   Marland Kitchen  Pitavastatin Calcium (LIVALO) 1 MG TABS Take 1 mg by mouth daily.  . polyethylene glycol (MIRALAX / GLYCOLAX) packet Take 17 g by mouth at bedtime.   . pyridOXINE (VITAMIN B-6) 100 MG tablet Take 100 mg by mouth daily.   Marland Kitchen spironolactone (ALDACTONE) 50 MG tablet Take 50 mg by mouth daily.   Marland Kitchen triamcinolone cream (KENALOG) 0.1 % Apply 1 application topically 2 (two) times daily as needed (rash/ eczema).   . vitamin B-12 (CYANOCOBALAMIN) 1000 MCG tablet Take 1,000 mcg by mouth daily.   . vitamin E 100 UNIT capsule Take 100 Units by mouth daily.    No facility-administered encounter medications on file as of 06/11/2018.   :   Review of Systems:  Out of a complete 14 point review of systems, all are reviewed and negative with the exception of these symptoms as listed below:  Review of Systems  Neurological:       Pt presents today to discuss her labile BP. Pt has pain her left arm and a strange feeling in her tongue when her BP is elevated.    Objective:  Neurological Exam  Physical Exam Physical Examination:   Vitals:   06/11/18 1420 06/11/18 1519  BP: (!) 181/84 (!) 152/82  Pulse: 77    General Examination: The patient is a very pleasant 78 y.o. female in no acute distress. She appears well-developed and well-nourished and well groomed. She denies any headache buttook a second dose of her BiDil today in the office after her blood pressure was taken initially. Upon recheck her blood pressure had improved. She initially felt that she should wait in the waiting room until her blood pressure came down and she was not sure that she could drive, she was advised that we could call an ambulance and have her take to the ER if she had any additional symptoms and acutely felt ill. She declined this. She called her husband but he was not in town and shortly after  her recheck blood pressure was better, she reported feeling better and wanted to drive herself.  HEENT: Normocephalic, atraumatic, pupils are equal, round and reactive to light and accommodation. Corrective eyeglasses in place. Extraocular tracking is good, face is symmetric, no facial asymmetry noted, no dysarthria, no abnormal tongue movements.  Chest: Clear to auscultation without wheezing, rhonchi or crackles noted.  Heart: S1+S2+0, regular and normal without murmurs, rubs or gallops noted.   Abdomen: Soft, non-tender and non-distended with normal bowel sounds appreciated on auscultation.  Extremities: There is no pitting edema in the distal lower extremities bilaterally.  Skin: Warm and dry without trophic changes noted.  Musculoskeletal: exam reveals no obvious joint deformities, tenderness or joint swelling or erythema.   Neurologically:  Mental status: The patient is awake, alert and oriented in all 4 spheres. Her immediate and remote memory, attention, language skills and fund of knowledge are appropriate. There is no evidence of aphasia, agnosia, apraxia or anomia. Speech is clear with normal prosody and enunciation. Thought process is linear. Mood is normal and affect is normal.  Cranial nerves II - XII are as described above under HEENT exam. In addition: shoulder shrug is normal with equal shoulder height noted. Motor exam: Normal bulk, strength and tone is noted. There is no drift, tremor or rebound. Reflexes are 1+ throughout. Fine motor skills and coordination: grossly intact.  Cerebellar testing: No dysmetria or intention tremor.  Sensory exam: intact to light touch.  Gait, station and balance: She stands easily. No veering  to one side is noted. No leaning to one side is noted. Posture is age-appropriate and stance is narrow based. Gait shows normal stride length and normal pace. No problems turning are noted.   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 Of note, she was seen in the emergency room in September with right-sided symptoms. She has a nonfocal neurological exam but did have an elevated blood pressure value earlier in the visit and took another pill of her BiDil. She did report that she typically takes it in the evening for her second dose but took a pill while in the office. She reported not feeling well and was offered to be taken to the emergency room as she was not able to elaborate as far as her symptoms. She denied any chest pain or one-sided numbness, tingling or weakness in the office. Upon recheck her blood pressure had improved to 152/82 when she also reported feeling better, declined to be picked up by a neighbor or her husband. She reports that her symptoms correlate with when her blood pressure has been up, recently, she was sent to the emergency room for elevated blood pressure value at the dentist office. She is advised to follow-up with her cardiologist for blood pressure management. She had recent CT scans of her head without contrast in July 2019 as well as September 2019. I suggested we proceed with a brain MRI without contrast to look for any signs of recent stroke. So long as the MRI results are reassuring and age-appropriate I will see her back on an as-needed basis.  I answered all her questions today and the patient was in agreement.  Star Age, MD, PhD

## 2018-06-12 DIAGNOSIS — I10 Essential (primary) hypertension: Secondary | ICD-10-CM | POA: Diagnosis not present

## 2018-06-15 ENCOUNTER — Telehealth: Payer: Self-pay | Admitting: Neurology

## 2018-06-15 NOTE — Telephone Encounter (Signed)
Medicare/BCBS Sara Brown: NPR spoke to [place] Ref # [id number] order sent to [facility] for pt to be aware. I left GI phone number of [phone] and to give them a call if she has not heard from them in the next 2-3 business days.

## 2018-06-18 ENCOUNTER — Ambulatory Visit
Admission: RE | Admit: 2018-06-18 | Discharge: 2018-06-18 | Disposition: A | Discharge status: Home / Self Care | Payer: Medicare Other | Source: Ambulatory Visit | Attending: Neurology | Admitting: Neurology

## 2018-06-18 DIAGNOSIS — R202 Paresthesia of skin: Secondary | ICD-10-CM

## 2018-06-18 DIAGNOSIS — R030 Elevated blood-pressure reading, without diagnosis of hypertension: Secondary | ICD-10-CM

## 2018-06-18 NOTE — Progress Notes (Signed)
MRI brain showed stable findings, nothing acute, which is reassuring. Compared to prior MRI, no significant changes. She is advised to FU with her PCP.  Sara Brown

## 2018-06-19 ENCOUNTER — Telehealth: Payer: Self-pay

## 2018-06-19 NOTE — Telephone Encounter (Signed)
I called pt and discussed her MRI results with her. Pt will follow up with Dr. Delfina Redwood and asked that we send a copy of this MRI to Dr. Delfina Redwood. Pt verbalized understanding of results. Pt had no questions at this time but was encouraged to call back if questions arise.

## 2018-06-19 NOTE — Telephone Encounter (Signed)
-----   Message from Star Age, MD sent at 06/18/2018  8:00 PM EST ----- MRI brain showed stable findings, nothing acute, which is reassuring. Compared to prior MRI, no significant changes. She is advised to FU with her PCP.  Michel Bickers

## 2018-06-24 DIAGNOSIS — M25469 Effusion, unspecified knee: Secondary | ICD-10-CM | POA: Diagnosis not present

## 2018-06-25 DIAGNOSIS — E782 Mixed hyperlipidemia: Secondary | ICD-10-CM | POA: Diagnosis not present

## 2018-06-25 DIAGNOSIS — E119 Type 2 diabetes mellitus without complications: Secondary | ICD-10-CM | POA: Diagnosis not present

## 2018-06-25 DIAGNOSIS — I2510 Atherosclerotic heart disease of native coronary artery without angina pectoris: Secondary | ICD-10-CM | POA: Diagnosis not present

## 2018-06-25 DIAGNOSIS — I10 Essential (primary) hypertension: Secondary | ICD-10-CM | POA: Diagnosis not present

## 2018-07-04 ENCOUNTER — Encounter: Payer: Self-pay | Admitting: Podiatry

## 2018-07-04 NOTE — Progress Notes (Signed)
Subjective: Sara Brown presents with diabetes, diabetic neuropathy and cc of painful, discolored, thick toenails and painful callus/corn which interfere with activities of daily living. Pain is aggravated when wearing enclosed shoe gear. Pain is relieved with periodic professional debridement.  Seward Carol, MD is her PCP.  Medications reviewed on today  Allergies  Allergen Reactions  . Amlodipine Hives  . Naproxen Other (See Comments)    Patient preference   . Prednisone Other (See Comments)    Patient Preference   . Losartan Rash    Vascular Examination: Capillary refill time <3 seconds x 10 digits Dorsalis pedis and Posterior tibial pulses present b/l No digital hair x 10 digits Skin temperature warm to cool b/l  Dermatological Examination: Skin with normal turgor, texture and tone b/l  Toenails 1-5 b/l discolored, thick, dystrophic with subungual debris and pain with palpation to nailbeds due to thickness of nails.  Hyperkeratotic lesion plantar central arch right foot and plantar lateral arch left foot, dorsal fifth PIPJ  Musculoskeletal: Muscle strength 5/5 to all LE muscle groups Hammertoe deformity 2 through 5 bilaterally  Neurological: Sensation diminished with 10 gram monofilament.   Assessment: 1. Painful onychomycosis toenails 1-5 b/l 2. Callus bilateral feet and corn fifth digit 3. NIDDM with Diabetic neuropathy  Plan: 1. Continue diabetic foot care principles.  2. Toenails 1-5 b/l were debrided in length and girth without iatrogenic bleeding. 3. Hyperkeratotic lesions pared with sterile chisel blade bilateral feet and fifth digit 4. She is to continue her diabetic shoe gear daily 5. Patient to report any pedal injuries to medical professional  6. Follow up 3 months. Patient/POA to call should there be a concern in the interim.

## 2018-07-09 DIAGNOSIS — M25561 Pain in right knee: Secondary | ICD-10-CM | POA: Diagnosis not present

## 2018-07-09 DIAGNOSIS — M25562 Pain in left knee: Secondary | ICD-10-CM | POA: Diagnosis not present

## 2018-07-14 ENCOUNTER — Ambulatory Visit
Admission: RE | Admit: 2018-07-14 | Discharge: 2018-07-14 | Disposition: A | Discharge status: Home / Self Care | Payer: Medicare Other | Source: Ambulatory Visit | Attending: Internal Medicine | Admitting: Internal Medicine

## 2018-07-14 DIAGNOSIS — M8588 Other specified disorders of bone density and structure, other site: Secondary | ICD-10-CM

## 2018-07-14 DIAGNOSIS — M85851 Other specified disorders of bone density and structure, right thigh: Secondary | ICD-10-CM | POA: Diagnosis not present

## 2018-07-14 DIAGNOSIS — Z780 Asymptomatic menopausal state: Secondary | ICD-10-CM | POA: Diagnosis not present

## 2018-07-14 DIAGNOSIS — M810 Age-related osteoporosis without current pathological fracture: Secondary | ICD-10-CM | POA: Diagnosis not present

## 2018-07-14 DIAGNOSIS — M85852 Other specified disorders of bone density and structure, left thigh: Secondary | ICD-10-CM | POA: Diagnosis not present

## 2018-07-22 ENCOUNTER — Ambulatory Visit
Admission: RE | Admit: 2018-07-22 | Discharge: 2018-07-22 | Disposition: A | Discharge status: Home / Self Care | Payer: Medicare Other | Source: Ambulatory Visit | Attending: Geriatric Medicine | Admitting: Geriatric Medicine

## 2018-07-22 ENCOUNTER — Other Ambulatory Visit: Payer: Self-pay | Admitting: Geriatric Medicine

## 2018-07-22 DIAGNOSIS — E119 Type 2 diabetes mellitus without complications: Secondary | ICD-10-CM | POA: Diagnosis not present

## 2018-07-22 DIAGNOSIS — R042 Hemoptysis: Secondary | ICD-10-CM

## 2018-07-22 DIAGNOSIS — J4521 Mild intermittent asthma with (acute) exacerbation: Secondary | ICD-10-CM | POA: Diagnosis not present

## 2018-07-22 DIAGNOSIS — R05 Cough: Secondary | ICD-10-CM | POA: Diagnosis not present

## 2018-07-24 DIAGNOSIS — H35033 Hypertensive retinopathy, bilateral: Secondary | ICD-10-CM | POA: Diagnosis not present

## 2018-07-24 DIAGNOSIS — H40023 Open angle with borderline findings, high risk, bilateral: Secondary | ICD-10-CM | POA: Diagnosis not present

## 2018-07-24 DIAGNOSIS — I708 Atherosclerosis of other arteries: Secondary | ICD-10-CM | POA: Diagnosis not present

## 2018-07-24 DIAGNOSIS — E119 Type 2 diabetes mellitus without complications: Secondary | ICD-10-CM | POA: Diagnosis not present

## 2018-07-30 ENCOUNTER — Other Ambulatory Visit: Payer: Self-pay | Admitting: Gastroenterology

## 2018-07-30 DIAGNOSIS — K5900 Constipation, unspecified: Secondary | ICD-10-CM | POA: Diagnosis not present

## 2018-07-30 DIAGNOSIS — R1011 Right upper quadrant pain: Secondary | ICD-10-CM

## 2018-07-30 DIAGNOSIS — K589 Irritable bowel syndrome without diarrhea: Secondary | ICD-10-CM | POA: Diagnosis not present

## 2018-07-30 DIAGNOSIS — Z86010 Personal history of colonic polyps: Secondary | ICD-10-CM | POA: Diagnosis not present

## 2018-08-02 ENCOUNTER — Other Ambulatory Visit: Payer: Self-pay | Admitting: Cardiology

## 2018-08-02 DIAGNOSIS — I10 Essential (primary) hypertension: Secondary | ICD-10-CM

## 2018-08-05 ENCOUNTER — Ambulatory Visit
Admission: RE | Admit: 2018-08-05 | Discharge: 2018-08-05 | Disposition: A | Discharge status: Home / Self Care | Payer: Medicare Other | Source: Ambulatory Visit | Attending: Gastroenterology | Admitting: Gastroenterology

## 2018-08-05 DIAGNOSIS — R1011 Right upper quadrant pain: Secondary | ICD-10-CM | POA: Diagnosis not present

## 2018-08-13 ENCOUNTER — Ambulatory Visit (INDEPENDENT_AMBULATORY_CARE_PROVIDER_SITE_OTHER): Payer: Medicare Other | Admitting: Podiatry

## 2018-08-13 ENCOUNTER — Encounter: Payer: Self-pay | Admitting: Podiatry

## 2018-08-13 DIAGNOSIS — E1142 Type 2 diabetes mellitus with diabetic polyneuropathy: Secondary | ICD-10-CM | POA: Diagnosis not present

## 2018-08-13 DIAGNOSIS — I10 Essential (primary) hypertension: Secondary | ICD-10-CM | POA: Diagnosis not present

## 2018-08-13 DIAGNOSIS — E7800 Pure hypercholesterolemia, unspecified: Secondary | ICD-10-CM | POA: Diagnosis not present

## 2018-08-13 DIAGNOSIS — R05 Cough: Secondary | ICD-10-CM | POA: Diagnosis not present

## 2018-08-13 NOTE — Progress Notes (Signed)
She presents today simply asked questions regarding diabetic peripheral neuropathy.  Objective: Vital signs are stable she alert oriented x3 pulses are palpable.  Neurologic sensorium is diminished per Semmes Weinstein monofilament muscle strength is intact bilateral.  Deep tendon reflexes are intact orthopedic evaluation of straits hammertoe deformities flexible nature bilateral.  Mild pes planus.  Cutaneous evaluation demonstrates no open lesions ulcers or wounds.  Assessment: Onychomycosis porokeratosis bilateral diabetic peripheral neuropathy.  Plan: Follow-up with her as needed.  She will continue treatment with Dr. Adah Perl.

## 2018-08-14 ENCOUNTER — Telehealth: Payer: Self-pay

## 2018-08-14 NOTE — Telephone Encounter (Signed)
Pt called and her BP has been high for 2 days now.Please advise 173/95 173/90

## 2018-08-14 NOTE — Telephone Encounter (Signed)
Take an extra dose of clonidine 0.1 mg in the afternoon, in addition to bedtime.

## 2018-08-26 ENCOUNTER — Telehealth: Payer: Self-pay

## 2018-08-26 NOTE — Telephone Encounter (Signed)
Pt called c/o bp being high yesterday of 162/95 she took a 4th dose of bidil by accident and this morning her bp was 137/81 with a pulse of 50 which she is worried about please advise; SJ

## 2018-08-26 NOTE — Telephone Encounter (Signed)
It will not be harmful. BP and heart rate are acceptable. Continue Bidil as she is taking.  Thanks MJP

## 2018-08-27 NOTE — Telephone Encounter (Signed)
Pt aware and will call if she needs anything else

## 2018-08-28 ENCOUNTER — Other Ambulatory Visit: Payer: Self-pay | Admitting: Cardiology

## 2018-08-28 DIAGNOSIS — I10 Essential (primary) hypertension: Secondary | ICD-10-CM

## 2018-08-31 ENCOUNTER — Ambulatory Visit: Payer: Self-pay | Admitting: Cardiology

## 2018-09-01 DIAGNOSIS — R05 Cough: Secondary | ICD-10-CM | POA: Diagnosis not present

## 2018-09-04 ENCOUNTER — Ambulatory Visit: Payer: Self-pay | Admitting: Cardiology

## 2018-09-04 ENCOUNTER — Ambulatory Visit: Payer: Medicare Other | Admitting: Podiatry

## 2018-09-13 ENCOUNTER — Telehealth: Payer: Self-pay | Admitting: Cardiology

## 2018-09-13 NOTE — Telephone Encounter (Signed)
Patient called with elevated blood pressure 180s/80s. Take additional dose of clonidine 0.1 mg today. Will arrange virtual visit or follow up.

## 2018-09-17 ENCOUNTER — Other Ambulatory Visit: Payer: Self-pay

## 2018-09-17 ENCOUNTER — Ambulatory Visit (INDEPENDENT_AMBULATORY_CARE_PROVIDER_SITE_OTHER): Payer: Medicare Other | Admitting: Cardiology

## 2018-09-17 ENCOUNTER — Encounter: Payer: Self-pay | Admitting: Cardiology

## 2018-09-17 VITALS — BP 140/89 | Ht 66.5 in | Wt 146.0 lb

## 2018-09-17 DIAGNOSIS — I10 Essential (primary) hypertension: Secondary | ICD-10-CM | POA: Diagnosis not present

## 2018-09-17 DIAGNOSIS — Z0000 Encounter for general adult medical examination without abnormal findings: Secondary | ICD-10-CM | POA: Diagnosis not present

## 2018-09-17 DIAGNOSIS — E1169 Type 2 diabetes mellitus with other specified complication: Secondary | ICD-10-CM | POA: Diagnosis not present

## 2018-09-17 DIAGNOSIS — Z1389 Encounter for screening for other disorder: Secondary | ICD-10-CM | POA: Diagnosis not present

## 2018-09-17 DIAGNOSIS — G459 Transient cerebral ischemic attack, unspecified: Secondary | ICD-10-CM | POA: Diagnosis not present

## 2018-09-17 DIAGNOSIS — M810 Age-related osteoporosis without current pathological fracture: Secondary | ICD-10-CM | POA: Diagnosis not present

## 2018-09-17 DIAGNOSIS — E7800 Pure hypercholesterolemia, unspecified: Secondary | ICD-10-CM | POA: Diagnosis not present

## 2018-09-17 DIAGNOSIS — I2510 Atherosclerotic heart disease of native coronary artery without angina pectoris: Secondary | ICD-10-CM | POA: Diagnosis not present

## 2018-09-17 DIAGNOSIS — I700 Atherosclerosis of aorta: Secondary | ICD-10-CM | POA: Diagnosis not present

## 2018-09-17 HISTORY — DX: Atherosclerotic heart disease of native coronary artery without angina pectoris: I25.10

## 2018-09-17 NOTE — Progress Notes (Signed)
Virtual Visit via Video Note   Subjective:   Sara Brown, female    DOB: Jan 19, 1941, 78 y.o.   MRN: [id number]   I connected with the patient on 09/17/18 by a telephone call and verified that I am speaking with the correct person using two identifiers.     I offered the patient a video enabled application for a virtual visit. Unfortunately, this could not be accomplished due to technical difficulties/lack of video enabled phone/computer. I discussed the limitations of evaluation and management by telemedicine and the availability of in person appointments. The patient expressed understanding and agreed to proceed.   This visit type was conducted due to national recommendations for restrictions regarding the COVID-19 Pandemic (e.g. social distancing).  This format is felt to be most appropriate for this patient at this time.  All issues noted in this document were discussed and addressed.  No physical exam was performed (except for noted visual exam findings with Tele health visits).  The patient has consented to conduct a Tele health visit and understands insurance will be billed.      Chief complaint:  Hypertension   HPI  78 y/o African-American female with hypertension, diet controlled diabetes, hyperlipidemia, GERD and remote history of TIA, mild nonobstructive coronary artery disease (Cath 04/01/2018).  She has been intolerant to several blood pressure medications and continues to have ups and down with her readings. Fortunately, blood pressure is better today. She is extremely worried about the occasional headaches and tingling in her face she gets. Of note, neurological workup has been normal before.    Past Medical History:  Diagnosis Date  . Allergic rhinitis   . Arthritis    "fingers, knees" (02/09/2018)  . Asthma    "stopped taking RX after dr said I don't have this" (02/09/2018)  . Carotid stenosis    ICA(L)  . Complication of anesthesia    "felt the cut  w/one of my c-sections" (02/09/2018)  . Constipation   . GERD (gastroesophageal reflux disease)   . Headache    "years ago; before menopause" (02/09/2018)  . Hyperlipidemia   . Hypertension   . Neuropathy    face (R)  . OA (osteoarthritis)   . Osteoporosis   . TIA (transient ischemic attack) ?2005  . Type II diabetes mellitus ([place])   . Vitamin D deficiency      Past Surgical History:  Procedure Laterality Date  . CATARACT EXTRACTION W/ INTRAOCULAR LENS  IMPLANT, BILATERAL Bilateral   . CESAREAN SECTION  1971; 1975  . INTRAVASCULAR PRESSURE WIRE/FFR STUDY N/A 04/01/2018   Procedure: INTRAVASCULAR PRESSURE WIRE/FFR STUDY;  Surgeon: Nigel Mormon, MD;  Location: [facility];  Service: Cardiovascular;  Laterality: N/A;  . LEFT HEART CATH AND CORONARY ANGIOGRAPHY N/A 04/01/2018   Procedure: LEFT HEART CATH AND CORONARY ANGIOGRAPHY;  Surgeon: Nigel Mormon, MD;  Location: [facility];  Service: Cardiovascular;  Laterality: N/A;  . TUBAL LIGATION  1975     Social History   Socioeconomic History  . Marital status: Married    Spouse name: Bland Span  . Number of children: 2  . Years of education: 16  . Highest education level: Bachelor's degree (e.g., BA, AB, BS)  Occupational History    Comment: employed at home  Social Needs  . Financial resource strain: Not hard at all  . Food insecurity:    Worry: Never true    Inability: Never true  . Transportation needs:    Medical:  No    Non-medical: No  Tobacco Use  . Smoking status: Never Smoker  . Smokeless tobacco: Never Used  Substance and Sexual Activity  . Alcohol use: Never    Frequency: Never  . Drug use: Never  . Sexual activity: Not Currently  Lifestyle  . Physical activity:    Days per week: 1 day    Minutes per session: 30 min  . Stress: Not at all  Relationships  . Social connections:    Talks on phone: More than three times a week    Gets together: More than three times a week    Attends  religious service: 1 to 4 times per year    Active member of club or organization: No    Attends meetings of clubs or organizations: Never    Relationship status: Married  . Intimate partner violence:    Fear of current or ex partner: No    Emotionally abused: No    Physically abused: No    Forced sexual activity: No  Other Topics Concern  . Not on file  Social History Narrative   Consumes no caffeine     Family History  Problem Relation Age of Onset  . Diabetes Mellitus II Mother   . Osteoporosis Mother   . Hypertension Mother   . Diabetes Mellitus II Father   . CVA Sister        4  . CAD Sister        4  . Hypertension Sister        4  . Osteoarthritis Sister        4  . Diabetes Mellitus II Sister        4  . Dementia Sister        decreased  . Alcoholism Brother   . Breast cancer Cousin 5     Current Outpatient Medications on File Prior to Visit  Medication Sig Dispense Refill  . albuterol (PROVENTIL HFA;VENTOLIN HFA) 108 (90 Base) MCG/ACT inhaler Inhale 1 puff into the lungs every 4 (four) hours as needed.    Marland Kitchen alendronate (FOSAMAX) 70 MG tablet Take 1 tablet by mouth once a week.    Marland Kitchen aspirin EC 81 MG tablet Take 81 mg by mouth daily.    Marland Kitchen BIDIL 20-37.5 MG tablet TAKE 1 TABLET BY MOUTH THREE TIMES A DAY 90 tablet 0  . calcium citrate-vitamin D 500-400 MG-UNIT chewable tablet Chew 1 tablet by mouth daily.    . carboxymethylcellulose (REFRESH PLUS) 0.5 % SOLN Place 1-2 drops into both eyes 3 (three) times daily.     . Cholecalciferol (VITAMIN D3 PO) Take 4 capsules by mouth daily.     . cloNIDine (CATAPRES) 0.1 MG tablet Take 0.1 mg by mouth 2 (two) times daily.   3  . Coenzyme Q10 (COQ10) 200 MG CAPS Take 200 mg by mouth daily.    . cycloSPORINE (RESTASIS) 0.05 % ophthalmic emulsion Place 1 drop into both eyes 2 (two) times daily.     . Emollient (CERAVE EX) Apply 1 application topically See admin instructions. Apply to affected areas daily as directed or as  needed for dryness    . fluticasone (FLONASE) 50 MCG/ACT nasal spray Place 1 spray into both nostrils at bedtime as needed for allergies or rhinitis.    . Garlic 5498 MG CAPS Take 1 capsule by mouth daily.    . hydroquinone 4 % cream Apply to face nightly    . KRILL OIL PO Take  1 capsule by mouth daily.    . Magnesium 250 MG TABS Take 1 tablet by mouth daily.    . Multiple Vitamins-Minerals (MULTIVITAMIN WITH MINERALS) tablet Take 1 tablet by mouth daily. Centrum for Women    . omeprazole (PRILOSEC OTC) 20 MG tablet Take 20 mg by mouth daily as needed (for reflux).     . Pitavastatin Calcium (LIVALO) 1 MG TABS Take 1 mg by mouth daily.    . polyethylene glycol (MIRALAX / GLYCOLAX) packet Take 17 g by mouth at bedtime.     . pyridOXINE (VITAMIN B-6) 100 MG tablet Take 100 mg by mouth daily.     Marland Kitchen spironolactone (ALDACTONE) 50 MG tablet Take 50 mg by mouth daily.     Marland Kitchen triamcinolone cream (KENALOG) 0.1 % Apply 1 application topically 2 (two) times daily as needed (rash/ eczema).   2  . vitamin B-12 (CYANOCOBALAMIN) 1000 MCG tablet Take 2,500 mcg by mouth daily.     . vitamin E 100 UNIT capsule Take 100 Units by mouth daily.     . Wheat Dextrin (BENEFIBER) POWD Take 2 application by mouth daily.     No current facility-administered medications on file prior to visit.     Cardiovascular studies:  Cath 04/01/2018: LM: Normal LAD: Prox-mid 40% stenosis. iFR 0.99 (Physiologically nonsignificant) LCx: Dominant. Small branches with mild disease RCA: Nondominant. Normal   LVEDP mildly elevated.   Carotid artery duplex 02/23/2018: No hemodynamically significant arterial disease in the internal carotid artery bilaterally. No significant plaque burden. Antegrade right vertebral artery flow. Antegrade left vertebral artery flow.  Echocardiogram 10/29/2017: Left ventricle cavity is normal in size. Mild concentric hypertrophy of the left ventricle. Normal global wall motion. Normal diastolic  filling pattern. Calculated EF 69%. No significant valvular abnormality.  Insignificant pericardial effusion.  Exercise myoview stress 10/20/2017: 1. The resting electrocardiogram demonstrated normal sinus rhythm, normal resting conduction and no resting arrhythmias.  The stress electrocardiogram was normal.  Patient exercised on Bruce protocol for 7:43 minutes and achieved 8.54 METS. Stress test terminated due to mid sternal chest pain and 90% % MPHR achieved (Target HR >85%). Normal BP. 2.  Myocardial perfusion imaging is normal. Overall left ventricular systolic function was normal without regional wall motion abnormalities. The left ventricular ejection fraction was 76%.  This is a low risk study.  Recent labs: Labs 06/08/2018: Glucose 97.  BUN/creatinine 19/0.87. EGFR normal.  Sodium 139, potassium 4.3.   H/H 13/41.  MCV 91.  Platelets 236   ROS       Vitals:   09/17/18 0947  BP: 140/89   (Measured by the patient using a home BP monitor)   Observation/findings during video visit   Objective:     Physical exam: Not performed., as this is a telephone visit       Assessment & Recommendations:   78 y/o African-American female with hypertension, diet controlled diabetes, hyperlipidemia, GERD and remote history of TIA, mild nonobstructive coronary artery disease (Cath 04/01/2018).  Uncontrolled hypertension: Intoelrane to several antihypertenaive medications. BP improving now. Recommend spacing out her medications as follows.    Bidil 20-37.5 mg 7-9 AM Spironolactone 0 mg 11 AM-noon Bidil 20-37.5 mg 2-3 PM Bidil 20-37.5 mg 6-7 PM Clonidine 0.1 mg at bedtime Additional clonidine 0.1 mg PRN during the day for SBP>140 mmHg  Nonobstructive coronary artery disease: Continue risk factor modification with BP and lipid lowering therapy. Continue aspirin 81 mg daily.  Return virtual/telephone visit in 8 weeks.   Tymier Lindholm  Esther Hardy, MD [facility]  Pager: [phone] Office: [phone] If no answer Cell [phone]

## 2018-09-24 ENCOUNTER — Telehealth: Payer: Self-pay

## 2018-09-24 ENCOUNTER — Emergency Department (HOSPITAL_COMMUNITY)
Admission: EM | Admit: 2018-09-24 | Discharge: 2018-09-24 | Disposition: A | Discharge status: Home / Self Care | Payer: Medicare Other | Attending: Emergency Medicine | Admitting: Emergency Medicine

## 2018-09-24 ENCOUNTER — Encounter (HOSPITAL_COMMUNITY): Payer: Self-pay | Admitting: *Deleted

## 2018-09-24 ENCOUNTER — Other Ambulatory Visit: Payer: Self-pay

## 2018-09-24 ENCOUNTER — Emergency Department (HOSPITAL_COMMUNITY): Payer: Medicare Other

## 2018-09-24 DIAGNOSIS — Z8673 Personal history of transient ischemic attack (TIA), and cerebral infarction without residual deficits: Secondary | ICD-10-CM | POA: Insufficient documentation

## 2018-09-24 DIAGNOSIS — R0789 Other chest pain: Secondary | ICD-10-CM

## 2018-09-24 DIAGNOSIS — R072 Precordial pain: Secondary | ICD-10-CM | POA: Insufficient documentation

## 2018-09-24 DIAGNOSIS — Z79899 Other long term (current) drug therapy: Secondary | ICD-10-CM | POA: Diagnosis not present

## 2018-09-24 DIAGNOSIS — E119 Type 2 diabetes mellitus without complications: Secondary | ICD-10-CM | POA: Insufficient documentation

## 2018-09-24 DIAGNOSIS — I10 Essential (primary) hypertension: Secondary | ICD-10-CM | POA: Insufficient documentation

## 2018-09-24 DIAGNOSIS — M25511 Pain in right shoulder: Secondary | ICD-10-CM | POA: Diagnosis not present

## 2018-09-24 DIAGNOSIS — J45909 Unspecified asthma, uncomplicated: Secondary | ICD-10-CM | POA: Diagnosis not present

## 2018-09-24 DIAGNOSIS — Z7982 Long term (current) use of aspirin: Secondary | ICD-10-CM | POA: Insufficient documentation

## 2018-09-24 DIAGNOSIS — R079 Chest pain, unspecified: Secondary | ICD-10-CM | POA: Diagnosis not present

## 2018-09-24 DIAGNOSIS — R0602 Shortness of breath: Secondary | ICD-10-CM | POA: Diagnosis not present

## 2018-09-24 LAB — BASIC METABOLIC PANEL
Anion gap: 10 (ref 5–15)
BUN: 18 mg/dL (ref 8–23)
CO2: 23 mmol/L (ref 22–32)
Calcium: 10.1 mg/dL (ref 8.9–10.3)
Chloride: 104 mmol/L (ref 98–111)
Creatinine, Ser: 0.89 mg/dL (ref 0.44–1.00)
GFR calc Af Amer: >60 mL/min (ref >60)
GFR calc non Af Amer: >60 mL/min (ref >60)
Glucose, Bld: 93 mg/dL (ref 70–99)
Potassium: 3.9 mmol/L (ref 3.5–5.1)
Sodium: 137 mmol/L (ref 135–145)

## 2018-09-24 LAB — CBC
HCT: 37.6 % (ref 36.0–46.0)
Hemoglobin: 12 g/dL (ref 12.0–15.0)
MCH: 28.2 pg (ref 26.0–34.0)
MCHC: 31.9 g/dL (ref 30.0–36.0)
MCV: 88.5 fL (ref 80.0–100.0)
Platelets: 217 10*3/uL (ref 150–400)
RBC: 4.25 MIL/uL (ref 3.87–5.11)
RDW: 14.3 % (ref 11.5–15.5)
WBC: 6.3 10*3/uL (ref 4.0–10.5)
nRBC: 0 % (ref 0.0–0.2)

## 2018-09-24 LAB — I-STAT TROPONIN, ED: Troponin i, poc: 0 ng/mL (ref 0.00–0.08)

## 2018-09-24 LAB — TROPONIN I: Troponin I: <0.03 ng/mL (ref <0.03)

## 2018-09-24 MED ORDER — ACETAMINOPHEN 325 MG PO TABS: 650.0000 mg | ORAL_TABLET | Freq: Once | ORAL | Status: DC

## 2018-09-24 MED ORDER — SODIUM CHLORIDE 0.9% FLUSH: 3.0000 mL | Freq: Once | INTRAVENOUS | Status: DC

## 2018-09-24 NOTE — Telephone Encounter (Signed)
Pt called with chest pain and SOb since last night

## 2018-09-24 NOTE — ED Notes (Signed)
Husband can be reached at mobile number listed in chart.

## 2018-09-24 NOTE — ED Provider Notes (Signed)
[facility] EMERGENCY DEPARTMENT Provider Note   CSN: [id number] Arrival date & time: 09/24/18  1409    History   Chief Complaint Chief Complaint  Patient presents with  . Shortness of Breath  . Chest Pain    HPI Sara Brown is a 78 y.o. female.     Patient c/o mid chest discomfort for the past 2 days. Also c/o right shoulder pain. Pain dull, moderate, at rest, not relation to exertion or position, worse w movement. No associated sob, nv or diaphoresis. No pleuritic pain. Occasional non prod cough. No fever or chills. No sore throat. No abd pain or nvd. No leg pain or swelling.   The history is provided by the patient.  Shortness of Breath  Associated symptoms: chest pain and cough   Associated symptoms: no abdominal pain, no fever, no headaches, no neck pain, no rash, no sore throat and no vomiting   Chest Pain  Associated symptoms: cough   Associated symptoms: no abdominal pain, no back pain, no dysphagia, no fever, no headache, no shortness of breath and no vomiting     Past Medical History:  Diagnosis Date  . Allergic rhinitis   . Arthritis    "fingers, knees" (02/09/2018)  . Asthma    "stopped taking RX after dr said I don't have this" (02/09/2018)  . Carotid stenosis    ICA(L)  . Complication of anesthesia    "felt the cut w/one of my c-sections" (02/09/2018)  . Constipation   . GERD (gastroesophageal reflux disease)   . Headache    "years ago; before menopause" (02/09/2018)  . Hyperlipidemia   . Hypertension   . Neuropathy    face (R)  . OA (osteoarthritis)   . Osteoporosis   . TIA (transient ischemic attack) ?2005  . Type II diabetes mellitus ([place])   . Vitamin D deficiency     Patient Active Problem List   Diagnosis Date Noted  . Coronary artery disease involving native coronary artery of native heart without angina pectoris 09/17/2018  . Diabetes mellitus type 2 in nonobese ([place]) 03/31/2018  . Essential hypertension  03/31/2018  . Dyslipidemia 03/31/2018  . Chest pain 02/10/2018  . Chest pain due to coronary artery disease ([place]) 02/09/2018  . Angina at rest [facility]) 02/09/2018  . Bradycardia 02/09/2018  . Skin laxity 06/12/2017  . Hirsutism 06/12/2017  . Hyperpigmentation 02/13/2017  . Neck mass 01/17/2017  . 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 Ear pain, left 02/21/2016  . GERD (gastroesophageal reflux disease)   . Shortness of breath 07/30/2007  . Cough 07/30/2007    Past Surgical History:  Procedure Laterality Date  . CATARACT EXTRACTION W/ INTRAOCULAR LENS  IMPLANT, BILATERAL Bilateral   . CESAREAN SECTION  1971; 1975  . INTRAVASCULAR PRESSURE WIRE/FFR STUDY N/A 04/01/2018   Procedure: INTRAVASCULAR PRESSURE WIRE/FFR STUDY;  Surgeon: Nigel Mormon, MD;  Location: [facility];  Service: Cardiovascular;  Laterality: N/A;  . LEFT HEART CATH AND CORONARY ANGIOGRAPHY N/A 04/01/2018   Procedure: LEFT HEART CATH AND CORONARY ANGIOGRAPHY;  Surgeon: Nigel Mormon, MD;  Location: [facility];  Service: Cardiovascular;  Laterality: N/A;  . TUBAL LIGATION  1975     OB History   No obstetric history on file.      Home Medications    Prior to Admission medications   Medication Sig Start Date End Date Taking? Authorizing Provider  albuterol (  PROVENTIL HFA;VENTOLIN HFA) 108 (90 Base) MCG/ACT inhaler Inhale 1 puff into the lungs every 4 (four) hours as needed. 07/23/18   [provider]  alendronate (FOSAMAX) 70 MG tablet Take 1 tablet by mouth once a week. 08/09/18   [provider]  aspirin EC 81 MG tablet Take 81 mg by mouth daily.    [provider]  BIDIL 20-37.5 MG tablet TAKE 1 TABLET BY MOUTH THREE TIMES A DAY 08/28/18   Patwardhan, Manish J, MD  calcium citrate-vitamin D 500-400 MG-UNIT chewable tablet Chew 1 tablet by mouth daily.    [provider]  carboxymethylcellulose  (REFRESH PLUS) 0.5 % SOLN Place 1-2 drops into both eyes 3 (three) times daily.     [provider]  Cholecalciferol (VITAMIN D3 PO) Take 4 capsules by mouth daily.     [provider]  cloNIDine (CATAPRES) 0.1 MG tablet Take 0.1 mg by mouth at bedtime and may repeat dose one time if needed.  10/01/17   [provider]  Coenzyme Q10 (COQ10) 200 MG CAPS Take 200 mg by mouth daily.    [provider]  cycloSPORINE (RESTASIS) 0.05 % ophthalmic emulsion Place 1 drop into both eyes 2 (two) times daily.     [provider]  Emollient (CERAVE EX) Apply 1 application topically See admin instructions. Apply to affected areas daily as directed or as needed for dryness    [provider]  fluticasone (FLONASE) 50 MCG/ACT nasal spray Place 1 spray into both nostrils at bedtime as needed for allergies or rhinitis.    [provider]  Garlic 7062 MG CAPS Take 1 capsule by mouth daily.    [provider]  hydroquinone 4 % cream Apply to face nightly 04/30/18   [provider]  KRILL OIL PO Take 1 capsule by mouth daily.    [provider]  Magnesium 250 MG TABS Take 1 tablet by mouth daily.    [provider]  Multiple Vitamins-Minerals (MULTIVITAMIN WITH MINERALS) tablet Take 1 tablet by mouth daily. Centrum for Women    [provider]  omeprazole (PRILOSEC OTC) 20 MG tablet Take 20 mg by mouth daily as needed (for reflux).     [provider]  Pitavastatin Calcium (LIVALO) 1 MG TABS Take 1 mg by mouth daily.    [provider]  polyethylene glycol (MIRALAX / GLYCOLAX) packet Take 17 g by mouth at bedtime.     [provider]  pyridOXINE (VITAMIN B-6) 100 MG tablet Take 100 mg by mouth daily.     [provider]  spironolactone (ALDACTONE) 50 MG tablet Take 50 mg by mouth daily.     [provider]  triamcinolone cream (KENALOG) 0.1 % Apply 1 application  topically 2 (two) times daily as needed (rash/ eczema).  11/01/14   [provider]  vitamin B-12 (CYANOCOBALAMIN) 1000 MCG tablet Take 2,500 mcg by mouth daily.     [provider]  vitamin E 100 UNIT capsule Take 100 Units by mouth daily.     [provider]  Wheat Dextrin (BENEFIBER) POWD Take 2 application by mouth daily.    [provider]    Family History Family History  Problem Relation Age of Onset  . Diabetes Mellitus II Mother   . Osteoporosis Mother   . Hypertension Mother   . Diabetes Mellitus II Father   . CVA Sister        4  . CAD Sister  4  . Hypertension Sister        4  . Osteoarthritis Sister        4  . Diabetes Mellitus II Sister        4  . Dementia Sister        decreased  . Alcoholism Brother   . Breast cancer Cousin 58    Social History Social History   Tobacco Use  . Smoking status: Never Smoker  . Smokeless tobacco: Never Used  Substance Use Topics  . Alcohol use: Never    Frequency: Never  . Drug use: Never     Allergies   Naproxen; Prednisone; Amlodipine; and Losartan   Review of Systems Review of Systems  Constitutional: Negative for fever.  HENT: Negative for sore throat and trouble swallowing.   Eyes: Negative for redness.  Respiratory: Positive for cough. Negative for shortness of breath.   Cardiovascular: Positive for chest pain.  Gastrointestinal: Negative for abdominal pain and vomiting.  Endocrine: Negative for polyuria.  Genitourinary: Negative for flank pain.  Musculoskeletal: Negative for back pain and neck pain.  Skin: Negative for rash.  Neurological: Negative for headaches.  Hematological: Does not bruise/bleed easily.  Psychiatric/Behavioral: Negative for confusion.     Physical Exam Updated Vital Signs BP (!) 169/90 (BP Location: Left Arm)   Temp 98 F (36.7 C) (Oral)   Resp 20   Ht 1.689 m (5' 6.5")   Wt 65.8 kg   SpO2 100%   BMI 23.05 kg/m   Physical  Exam Vitals signs and nursing note reviewed.  Constitutional:      Appearance: Normal appearance. She is well-developed.  HENT:     Head: Atraumatic.     Nose: Nose normal.     Mouth/Throat:     Mouth: Mucous membranes are moist.  Eyes:     General: No scleral icterus.    Conjunctiva/sclera: Conjunctivae normal.  Neck:     Musculoskeletal: Normal range of motion and neck supple. No neck rigidity or muscular tenderness.     Trachea: No tracheal deviation.  Cardiovascular:     Rate and Rhythm: Normal rate and regular rhythm.     Pulses: Normal pulses.     Heart sounds: Normal heart sounds. No murmur. No friction rub. No gallop.   Pulmonary:     Effort: Pulmonary effort is normal. No respiratory distress.     Breath sounds: Normal breath sounds.  Abdominal:     General: Bowel sounds are normal. There is no distension.     Palpations: Abdomen is soft.     Tenderness: There is no abdominal tenderness. There is no guarding.  Genitourinary:    Comments: No cva tenderness.  Musculoskeletal:        General: No swelling.     Right lower leg: No edema.     Left lower leg: No edema.     Comments: Right shoulder good rom without pain. mp deformity noted. Radial pulses 2+ bil. c spine good rom without pain.  cspine nt.   Skin:    General: Skin is warm and dry.     Findings: No rash.  Neurological:     Mental Status: She is alert.     Comments: Alert, speech normal. LUE motor/sens grossly intact.   Psychiatric:        Mood and Affect: Mood normal.      ED Treatments / Results  Labs (all labs ordered are listed, but only abnormal results are displayed) Results  for orders placed or performed during the hospital encounter of 50/09/38  Basic metabolic panel  Result Value Ref Range   Sodium 137 135 - 145 mmol/L   Potassium 3.9 3.5 - 5.1 mmol/L   Chloride 104 98 - 111 mmol/L   CO2 23 22 - 32 mmol/L   Glucose, Bld 93 70 - 99 mg/dL   BUN 18 8 - 23 mg/dL   Creatinine, Ser 0.89 0.44 -  1.00 mg/dL   Calcium 10.1 8.9 - 10.3 mg/dL   GFR calc non Af Amer >60 >60 mL/min   GFR calc Af Amer >60 >60 mL/min   Anion gap 10 5 - 15  Troponin I - ONCE - STAT  Result Value Ref Range   Troponin I <0.03 <0.03 ng/mL  CBC  Result Value Ref Range   WBC 6.3 4.0 - 10.5 K/uL   RBC 4.25 3.87 - 5.11 MIL/uL   Hemoglobin 12.0 12.0 - 15.0 g/dL   HCT 37.6 36.0 - 46.0 %   MCV 88.5 80.0 - 100.0 fL   MCH 28.2 26.0 - 34.0 pg   MCHC 31.9 30.0 - 36.0 g/dL   RDW 14.3 11.5 - 15.5 %   Platelets 217 150 - 400 K/uL   nRBC 0.0 0.0 - 0.2 %  I-stat troponin, ED  Result Value Ref Range   Troponin i, poc 0.00 0.00 - 0.08 ng/mL   Comment 3           Dg Chest 2 View  Result Date: 09/24/2018 CLINICAL DATA:  Shortness of breath and chest pain EXAM: CHEST - 2 VIEW COMPARISON:  July 22, 2018 FINDINGS: There is slight scarring in the right upper lobe and bilateral base regions. There is no edema or consolidation. Heart size and pulmonary vascularity are normal. No adenopathy. No bone lesions. No pneumothorax. IMPRESSION: Areas of slight scarring. No edema or consolidation. Stable cardiac silhouette. Electronically Signed   By: Lowella Grip III M.D.   On: 09/24/2018 15:09    EKG EKG Interpretation  Date/Time:  Thursday September 24 2018 13:14:24 EDT Ventricular Rate:  65 PR Interval:  180 QRS Duration: 84 QT Interval:  400 QTC Calculation: 416 R Axis:   -31 Text Interpretation:  Normal sinus rhythm Left axis deviation Baseline wander No significant change since last tracing Confirmed by Lajean Saver [phone]) on 09/24/2018 4:54:52 PM   Radiology Dg Chest 2 View  Result Date: 09/24/2018 CLINICAL DATA:  Shortness of breath and chest pain EXAM: CHEST - 2 VIEW COMPARISON:  July 22, 2018 FINDINGS: There is slight scarring in the right upper lobe and bilateral base regions. There is no edema or consolidation. Heart size and pulmonary vascularity are normal. No adenopathy. No bone lesions. No  pneumothorax. IMPRESSION: Areas of slight scarring. No edema or consolidation. Stable cardiac silhouette. Electronically Signed   By: Lowella Grip III M.D.   On: 09/24/2018 15:09    Procedures Procedures (including critical care time)  Medications Ordered in ED Medications  sodium chloride flush (NS) 0.9 % injection 3 mL (has no administration in time range)     Initial Impression / Assessment and Plan / ED Course  I have reviewed the triage vital signs and the nursing notes.  Pertinent labs & imaging results that were available during my care of the patient were reviewed by me and considered in my medical decision making (see chart for details).  Iv ns. Labs.   Reviewed nursing notes and prior charts for additional history.  Labs reviewed by me - initial trop normal.  Cardiology consulted-  Cardiologist indicates pt well known to him, that if delta trop in 2-3 hrs is normal, to d/c to home, feels not c/w acs.   cxr reviewed by me - no pna.   Delta trop is normal/0.  After constant symptoms for past day, trop x 2 normal.  Suspect msk pain.  Patient breathing comfortably, no distress. Acetaminophen po.   Patient appears comfortable.  Return precautions provided.   Final Clinical Impressions(s) / ED Diagnoses   Final diagnoses:  None    ED Discharge Orders    None       Lajean Saver, MD 09/24/18 1758

## 2018-09-24 NOTE — Telephone Encounter (Signed)
Called the patient. No answer. Left a voicemail.

## 2018-09-24 NOTE — Consult Note (Signed)
CARDIOLOGY CONSULT NOTE  Patient ID: Sara Brown MRN: [id number] DOB/AGE: Apr 21, 1941 78 y.o.  Admit date: 09/24/2018 Referring [place] [facility] Primary Physician:  Seward Carol, MD Reason for Consultation  Chest pain  HPI:   78 y/o African-American female with hypertension, diet controlled diabetes, hyperlipidemia, GERD and remote history of TIA, mild nonobstructive coronary artery disease (Cath 04/01/2018).  Patient has had central and right sided chest pain since last night. She feels fatigued and reports labored breathing. She denies any fever, cough. On a positive note, blood pressure is well controlled.   Past Medical History:  Diagnosis Date  . Allergic rhinitis   . Arthritis    "fingers, knees" (02/09/2018)  . Asthma    "stopped taking RX after dr said I don't have this" (02/09/2018)  . Carotid stenosis    ICA(L)  . Complication of anesthesia    "felt the cut w/one of my c-sections" (02/09/2018)  . Constipation   . GERD (gastroesophageal reflux disease)   . Headache    "years ago; before menopause" (02/09/2018)  . Hyperlipidemia   . Hypertension   . Neuropathy    face (R)  . OA (osteoarthritis)   . Osteoporosis   . TIA (transient ischemic attack) ?2005  . Type II diabetes mellitus ([place])   . Vitamin D deficiency      Past Surgical History:  Procedure Laterality Date  . CATARACT EXTRACTION W/ INTRAOCULAR LENS  IMPLANT, BILATERAL Bilateral   . CESAREAN SECTION  1971; 1975  . INTRAVASCULAR PRESSURE WIRE/FFR STUDY N/A 04/01/2018   Procedure: INTRAVASCULAR PRESSURE WIRE/FFR STUDY;  Surgeon: Nigel Mormon, MD;  Location: [facility];  Service: Cardiovascular;  Laterality: N/A;  . LEFT HEART CATH AND CORONARY ANGIOGRAPHY N/A 04/01/2018   Procedure: LEFT HEART CATH AND CORONARY ANGIOGRAPHY;  Surgeon: Nigel Mormon, MD;  Location: [facility];  Service: Cardiovascular;  Laterality: N/A;  . TUBAL LIGATION  1975     Family  History  Problem Relation Age of Onset  . Diabetes Mellitus II Mother   . Osteoporosis Mother   . Hypertension Mother   . Diabetes Mellitus II Father   . CVA Sister        4  . CAD Sister        4  . Hypertension Sister        4  . Osteoarthritis Sister        4  . Diabetes Mellitus II Sister        4  . Dementia Sister        decreased  . Alcoholism Brother   . Breast cancer Cousin 75     Social History: Social History   Socioeconomic History  . Marital status: Married    Spouse name: Bland Span  . Number of children: 2  . Years of education: 16  . Highest education level: Bachelor's degree (e.g., BA, AB, BS)  Occupational History    Comment: employed at home  Social Needs  . Financial resource strain: Not hard at all  . Food insecurity:    Worry: Never true    Inability: Never true  . Transportation needs:    Medical: No    Non-medical: No  Tobacco Use  . Smoking status: Never Smoker  . Smokeless tobacco: Never Used  Substance and Sexual Activity  . Alcohol use: Never    Frequency: Never  . Drug use: Never  . Sexual activity: Not Currently  Lifestyle  .  Physical activity:    Days per week: 1 day    Minutes per session: 30 min  . Stress: Not at all  Relationships  . Social connections:    Talks on phone: More than three times a week    Gets together: More than three times a week    Attends religious service: 1 to 4 times per year    Active member of club or organization: No    Attends meetings of clubs or organizations: Never    Relationship status: Married  . Intimate partner violence:    Fear of current or ex partner: No    Emotionally abused: No    Physically abused: No    Forced sexual activity: No  Other Topics Concern  . Not on file  Social History Narrative   Consumes no caffeine     (Not in a hospital admission)   Review of Systems  Constitution: Negative for decreased appetite, malaise/fatigue, weight gain and weight loss.  HENT:  Negative for congestion.   Eyes: Negative for visual disturbance.  Cardiovascular: Negative for chest pain, dyspnea on exertion, leg swelling, palpitations and syncope.  Respiratory: Negative for shortness of breath.   Endocrine: Negative for cold intolerance.  Hematologic/Lymphatic: Does not bruise/bleed easily.  Skin: Negative for itching and rash.  Musculoskeletal: Negative for myalgias.  Gastrointestinal: Negative for abdominal pain, nausea and vomiting.  Genitourinary: Negative for dysuria.  Neurological: Negative for dizziness and weakness.  Psychiatric/Behavioral: The patient is not nervous/anxious.   All other systems reviewed and are negative.     Physical Exam: Physical Exam  Constitutional: She is oriented to person, place, and time. She appears well-developed and well-nourished. No distress.  HENT:  Head: Normocephalic and atraumatic.  Eyes: Pupils are equal, round, and reactive to light. Conjunctivae are normal.  Neck: No JVD present.  Cardiovascular: Normal rate, regular rhythm and intact distal pulses.  Pulmonary/Chest: Effort normal and breath sounds normal. She has no wheezes. She has no rales.  Reproducible chest wall tenderness  Abdominal: Soft. Bowel sounds are normal. There is no rebound.  Musculoskeletal:        General: No edema.  Lymphadenopathy:    She has no cervical adenopathy.  Neurological: She is alert and oriented to person, place, and time. No cranial nerve deficit.  Skin: Skin is warm and dry.  Psychiatric: She has a normal mood and affect.  Nursing note and vitals reviewed.    Labs:   Lab Results  Component Value Date   WBC 6.3 09/24/2018   HGB 12.0 09/24/2018   HCT 37.6 09/24/2018   MCV 88.5 09/24/2018   PLT 217 09/24/2018    Recent Labs  Lab 09/24/18 1452  NA 137  K 3.9  CL 104  CO2 23  BUN 18  CREATININE 0.89  CALCIUM 10.1  GLUCOSE 93    Lipid Panel     Component Value Date/Time   CHOL 186 02/10/2018 0434   TRIG 71  02/10/2018 0434   HDL 58 02/10/2018 0434   CHOLHDL 3.2 02/10/2018 0434   VLDL 14 02/10/2018 0434   LDLCALC 114 (H) 02/10/2018 0434    BNP (last 3 results) No results for input(s): BNP in the last 8760 hours.  HEMOGLOBIN A1C Lab Results  Component Value Date   HGBA1C 6.2 (H) 03/31/2018   MPG 131.24 03/31/2018    Cardiac Panel (last 3 results) Recent Labs    03/31/18 2225 04/01/18 0421 09/24/18 1452  TROPONINI <0.03 <0.03 <0.03  Lab Results  Component Value Date   TROPONINI <0.03 09/24/2018     TSH No results for input(s): TSH in the last 8760 hours.    Radiology: Dg Chest 2 View  Result Date: 09/24/2018 CLINICAL DATA:  Shortness of breath and chest pain EXAM: CHEST - 2 VIEW COMPARISON:  July 22, 2018 FINDINGS: There is slight scarring in the right upper lobe and bilateral base regions. There is no edema or consolidation. Heart size and pulmonary vascularity are normal. No adenopathy. No bone lesions. No pneumothorax. IMPRESSION: Areas of slight scarring. No edema or consolidation. Stable cardiac silhouette. Electronically Signed   By: Lowella Grip III M.D.   On: 09/24/2018 15:09    Scheduled Meds: . sodium chloride flush  3 mL Intravenous Once   Continuous Infusions: PRN Meds:.  CARDIAC STUDIES:  EKG 09/24/2018: Sinus rhythm. LAD. Nonspecific ST-T changes  Cath 04/01/2018: LM: Normal LAD: Prox-mid 40% stenosis. iFR 0.99 (Physiologically nonsignificant) LCx: Dominant. Small branches with mild disease RCA: Nondominant. Normal   LVEDP mildly elevated.   Carotid artery duplex 02/23/2018: No hemodynamically significant arterial disease in the internal carotid artery bilaterally. No significant plaque burden. Antegrade right vertebral artery flow. Antegrade left vertebral artery flow.  Echocardiogram 10/29/2017: Left ventricle cavity is normal in size. Mild concentric hypertrophy of the left ventricle. Normal global wall motion. Normal diastolic  filling pattern. Calculated EF 69%. No significant valvular abnormality.  Insignificant pericardial effusion.  Exercise myoview stress 10/20/2017: 1. The resting electrocardiogram demonstrated normal sinus rhythm, normal resting conduction and no resting arrhythmias. The stress electrocardiogram was normal. Patient exercised on Bruce protocol for 7:43 minutes and achieved 8.54 METS. Stress test terminated due to mid sternal chest pain and 90% % MPHR achieved (Target HR >85%). Normal BP. 2. Myocardial perfusion imaging is normal. Overall left ventricular systolic function was normal without regional wall motion abnormalities. The left ventricular ejection fraction was 76%. This is a low risk study.  Assessment & Recommendations:  78 y/o African-American female with hypertension, diet controlled diabetes, hyperlipidemia, GERD and remote history of TIA, mild nonobstructive coronary artery disease (Cath 04/01/2018).  Chest pain: Reproducible on palpation. Suspect musculoskeletal pain. Nonspecific EKG. Trop negative X1. She is 100% on RA with no respiratory distress. Clinical suspicion for PE is low. If Trop -ve X2, ACS can be safely excluded.   Hypertension: Much better controlled. Continue current antihypertensive therapy.  Nigel Mormon, MD 09/24/2018, 4:56 PM [facility] Pager: [phone] Office: [phone] If no answer Cell [phone]

## 2018-09-24 NOTE — ED Notes (Signed)
Patient verbalizes understanding of discharge instructions. Opportunity for questioning and answers were provided. Armband removed by staff, pt discharged from ED.  

## 2018-09-24 NOTE — Telephone Encounter (Signed)
Pt called again and stated she was having chest pain since last night

## 2018-09-24 NOTE — Discharge Instructions (Addendum)
It was our pleasure to provide your ER care today - we hope that you feel better.  Your heart tests look good/normal.   Take acetaminophen as need for pain.  Follow up with primary care doctor in the next couple weeks.   For chest discomfort, follow up with your cardiologist in the next couple weeks.  Return to ER if worse, new symptoms, increased trouble breathing, high fevers, other concern.

## 2018-09-24 NOTE — ED Triage Notes (Signed)
C/o discomfort in her chest onset last pm, states the pain goes to her right shoulder, c/lo sob. Occ. Cough.

## 2018-09-30 ENCOUNTER — Ambulatory Visit: Payer: Self-pay | Admitting: Cardiology

## 2018-10-19 ENCOUNTER — Other Ambulatory Visit: Payer: Self-pay

## 2018-10-19 DIAGNOSIS — I10 Essential (primary) hypertension: Secondary | ICD-10-CM

## 2018-10-19 MED ORDER — ISOSORB DINITRATE-HYDRALAZINE 20-37.5 MG PO TABS: 1.0000 | ORAL_TABLET | Freq: Three times a day (TID) | ORAL | 2 refills | Status: DC

## 2018-10-21 ENCOUNTER — Ambulatory Visit: Payer: Medicare Other | Admitting: Podiatry

## 2018-11-12 ENCOUNTER — Other Ambulatory Visit: Payer: Self-pay

## 2018-11-12 ENCOUNTER — Ambulatory Visit (INDEPENDENT_AMBULATORY_CARE_PROVIDER_SITE_OTHER): Payer: Medicare Other | Admitting: Cardiology

## 2018-11-12 ENCOUNTER — Encounter: Payer: Self-pay | Admitting: Cardiology

## 2018-11-12 VITALS — BP 136/83 | HR 59 | Ht 66.0 in | Wt 145.0 lb

## 2018-11-12 DIAGNOSIS — I10 Essential (primary) hypertension: Secondary | ICD-10-CM

## 2018-11-12 NOTE — Progress Notes (Signed)
° °Virtual Visit via Video Note  ° °Subjective:  ° °Sara Brown, female    DOB: 01/14/1941, 77 y.o.   MRN: [id number] ° ° °I connected with the patient on 11/12/2018 by a telephone call and verified that I am speaking with the correct person using two identifiers.  °   °I discussed the limitations of evaluation and management by telemedicine and the availability of in person appointments. The patient expressed understanding and agreed to proceed.  ° °This visit type was conducted due to national recommendations for restrictions regarding the COVID-19 Pandemic (e.g. social distancing).  This format is felt to be most appropriate for this patient at this time.  All issues noted in this document were discussed and addressed.  No physical exam was performed (except for noted visual exam findings with Tele health visits).  The patient has consented to conduct a Tele health visit and understands insurance will be billed.  ° ° ° ° °Chief complaint:  °Hypertension ° ° °HPI ° °77 y/o African-American female with hypertension, diet controlled diabetes, hyperlipidemia,  GERD and remote history of TIA, mild nonobstructive coronary artery disease (Cath 04/01/2018). ° °Overall, her BP is well controlled, except for occasional spikes in the morning. These are mitigated by an extra dose of Bidil.  ° °Cardiovascular studies: ° °Cath 04/01/2018: °LM: Normal °LAD: Prox-mid 40% stenosis. iFR 0.99 (Physiologically nonsignificant) °LCx: Dominant. Small branches with mild disease °RCA: Nondominant. Normal °  °LVEDP mildly elevated. °  °Carotid artery duplex 02/23/2018: °No hemodynamically significant arterial disease in the internal carotid artery bilaterally. No significant plaque burden. °Antegrade right vertebral artery flow. Antegrade left vertebral artery flow. ° °Echocardiogram 10/29/2017: °Left ventricle cavity is normal in size. Mild concentric hypertrophy of the left ventricle. Normal global wall motion. Normal  diastolic filling pattern. Calculated EF 69%. °No significant valvular abnormality.  °Insignificant pericardial effusion. ° °Exercise myoview stress 10/20/2017: °1. The resting electrocardiogram demonstrated normal sinus rhythm, normal resting conduction and no resting arrhythmias.  The stress electrocardiogram was normal.  Patient exercised on Bruce protocol for 7:43 minutes and achieved 8.54 METS. Stress test terminated due to mid sternal chest pain and 90% % MPHR achieved (Target HR >85%). Normal BP. °2.  Myocardial perfusion imaging is normal. Overall left ventricular systolic function was normal without regional wall motion abnormalities. The left ventricular ejection fraction was 76%.  This is a low risk study. ° °Recent labs: °Labs 06/08/2018: °Glucose 97.  BUN/creatinine 19/0.87. EGFR normal.  Sodium 139, potassium 4.3.   °H/H 13/41.  MCV 91.  Platelets 236 ° ° ° °Vitals:  ° 11/12/18 1009  °BP: 136/83  °Pulse: (!) 59  ° ° ° °   °Assessment & Recommendations:  ° °77 y/o African-American female with hypertension, diet controlled diabetes, hyperlipidemia,  GERD and remote history of TIA, mild nonobstructive coronary artery disease (Cath 04/01/2018). ° °Uncontrolled hypertension: °Intoelrane to several antihypertenaive medications. BP improving now. Recommend spacing out her medications as follows.   ° °Bidil 20-37.5 mg 7-9 AM °Spironolactone 0 mg 11 AM-noon °Bidil 20-37.5 mg 2-3 PM °Bidil 20-37.5 mg 6-7 PM °Clonidine 0.1 mg at bedtime °Additional clonidine 0.1 mg PRN during the day for SBP>140 mmHg ° °Nonobstructive coronary artery disease: °Continue risk factor modification with BP and lipid lowering therapy. °Continue aspirin 81 mg daily. ° °Follow up office visit in 3 months, as per the patient's request. ° °Time spent: 15 min ° °Manish J Patwardhan, MD °[facility] °Pager: [phone] °Office: [phone] °If no answer Cell [phone] °   ° °

## 2018-11-16 ENCOUNTER — Other Ambulatory Visit: Payer: Self-pay

## 2018-11-16 ENCOUNTER — Ambulatory Visit (INDEPENDENT_AMBULATORY_CARE_PROVIDER_SITE_OTHER): Payer: Medicare Other | Admitting: Podiatry

## 2018-11-16 ENCOUNTER — Encounter

## 2018-11-16 ENCOUNTER — Encounter: Payer: Self-pay | Admitting: Podiatry

## 2018-11-16 VITALS — Temp 97.7°F

## 2018-11-16 DIAGNOSIS — E119 Type 2 diabetes mellitus without complications: Secondary | ICD-10-CM

## 2018-11-16 DIAGNOSIS — B351 Tinea unguium: Secondary | ICD-10-CM

## 2018-11-16 DIAGNOSIS — M79674 Pain in right toe(s): Secondary | ICD-10-CM

## 2018-11-16 DIAGNOSIS — E1142 Type 2 diabetes mellitus with diabetic polyneuropathy: Secondary | ICD-10-CM | POA: Diagnosis not present

## 2018-11-16 DIAGNOSIS — Q828 Other specified congenital malformations of skin: Secondary | ICD-10-CM

## 2018-11-16 DIAGNOSIS — M79675 Pain in left toe(s): Secondary | ICD-10-CM | POA: Diagnosis not present

## 2018-11-16 NOTE — Patient Instructions (Signed)
Diabetes Mellitus and Foot Care  Foot care is an important part of your health, especially when you have diabetes. Diabetes may cause you to have problems because of poor blood flow (circulation) to your feet and legs, which can cause your skin to:   Become thinner and drier.   Break more easily.   Heal more slowly.   Peel and crack.  You may also have nerve damage (neuropathy) in your legs and feet, causing decreased feeling in them. This means that you may not notice minor injuries to your feet that could lead to more serious problems. Noticing and addressing any potential problems early is the best way to prevent future foot problems.  How to care for your feet  Foot hygiene   Wash your feet daily with warm water and mild soap. Do not use hot water. Then, pat your feet and the areas between your toes until they are completely dry. Do not soak your feet as this can dry your skin.   Trim your toenails straight across. Do not dig under them or around the cuticle. File the edges of your nails with an emery board or nail file.   Apply a moisturizing lotion or petroleum jelly to the skin on your feet and to dry, brittle toenails. Use lotion that does not contain alcohol and is unscented. Do not apply lotion between your toes.  Shoes and socks   Wear clean socks or stockings every day. Make sure they are not too tight. Do not wear knee-high stockings since they may decrease blood flow to your legs.   Wear shoes that fit properly and have enough cushioning. Always look in your shoes before you put them on to be sure there are no objects inside.   To break in new shoes, wear them for just a few hours a day. This prevents injuries on your feet.  Wounds, scrapes, corns, and calluses   Check your feet daily for blisters, cuts, bruises, sores, and redness. If you cannot see the bottom of your feet, use a mirror or ask someone for help.   Do not cut corns or calluses or try to remove them with medicine.   If you  find a minor scrape, cut, or break in the skin on your feet, keep it and the skin around it clean and dry. You may clean these areas with mild soap and water. Do not clean the area with peroxide, alcohol, or iodine.   If you have a wound, scrape, corn, or callus on your foot, look at it several times a day to make sure it is healing and not infected. Check for:  ? Redness, swelling, or pain.  ? Fluid or blood.  ? Warmth.  ? Pus or a bad smell.  General instructions   Do not cross your legs. This may decrease blood flow to your feet.   Do not use heating pads or hot water bottles on your feet. They may burn your skin. If you have lost feeling in your feet or legs, you may not know this is happening until it is too late.   Protect your feet from hot and cold by wearing shoes, such as at the beach or on hot pavement.   Schedule a complete foot exam at least once a year (annually) or more often if you have foot problems. If you have foot problems, report any cuts, sores, or bruises to your health care provider immediately.  Contact a health care provider if:     You have a medical condition that increases your risk of infection and you have any cuts, sores, or bruises on your feet.   You have an injury that is not healing.   You have redness on your legs or feet.   You feel burning or tingling in your legs or feet.   You have pain or cramps in your legs and feet.   Your legs or feet are numb.   Your feet always feel cold.   You have pain around a toenail.  Get help right away if:   You have a wound, scrape, corn, or callus on your foot and:  ? You have pain, swelling, or redness that gets worse.  ? You have fluid or blood coming from the wound, scrape, corn, or callus.  ? Your wound, scrape, corn, or callus feels warm to the touch.  ? You have pus or a bad smell coming from the wound, scrape, corn, or callus.  ? You have a fever.  ? You have a red line going up your leg.  Summary   Check your feet every day  for cuts, sores, red spots, swelling, and blisters.   Moisturize feet and legs daily.   Wear shoes that fit properly and have enough cushioning.   If you have foot problems, report any cuts, sores, or bruises to your health care provider immediately.   Schedule a complete foot exam at least once a year (annually) or more often if you have foot problems.  This information is not intended to replace advice given to you by your health care provider. Make sure you discuss any questions you have with your health care provider.  Document Released: 05/24/2000 Document Revised: 07/09/2017 Document Reviewed: 06/28/2016  [employer] Interactive Patient Education  2019 [employer]

## 2018-11-19 DIAGNOSIS — M792 Neuralgia and neuritis, unspecified: Secondary | ICD-10-CM | POA: Diagnosis not present

## 2018-11-19 DIAGNOSIS — E7800 Pure hypercholesterolemia, unspecified: Secondary | ICD-10-CM | POA: Diagnosis not present

## 2018-11-19 DIAGNOSIS — E1165 Type 2 diabetes mellitus with hyperglycemia: Secondary | ICD-10-CM | POA: Diagnosis not present

## 2018-11-19 DIAGNOSIS — I10 Essential (primary) hypertension: Secondary | ICD-10-CM | POA: Diagnosis not present

## 2018-11-19 DIAGNOSIS — Z79899 Other long term (current) drug therapy: Secondary | ICD-10-CM | POA: Diagnosis not present

## 2018-11-19 NOTE — Progress Notes (Signed)
Subjective: Sara Brown presents today for preventative diabetic foot care with history of diabetic neuropathy. She is seen for follow up of chronic, painful mycotic toenails and painful porokeratosis of right heel which interfere with daily activities and routine tasks.  Pain is relieved with periodic professional debridement.   She is inquiring about laser therapy for her mycotic toenails.   Seward Carol, MD is her PCP and last visit was 2 months ago per patient recall.   She has received her new diabetic shoes, is pleased with them, and voices no complaints about them.  Past Medical History:  Diagnosis Date  . Allergic rhinitis   . Arthritis    "fingers, knees" (02/09/2018)  . Asthma    "stopped taking RX after dr said I don't have this" (02/09/2018)  . Carotid stenosis    ICA(L)  . Complication of anesthesia    "felt the cut w/one of my c-sections" (02/09/2018)  . Constipation   . GERD (gastroesophageal reflux disease)   . Headache    "years ago; before menopause" (02/09/2018)  . Hyperlipidemia   . Hypertension   . Neuropathy    face (R)  . OA (osteoarthritis)   . Osteoporosis   . TIA (transient ischemic attack) ?2005  . Type II diabetes mellitus ([place])   . Vitamin D deficiency    Past Surgical History:  Procedure Laterality Date  . CATARACT EXTRACTION W/ INTRAOCULAR LENS  IMPLANT, BILATERAL Bilateral   . CESAREAN SECTION  1971; 1975  . INTRAVASCULAR PRESSURE WIRE/FFR STUDY N/A 04/01/2018   Procedure: INTRAVASCULAR PRESSURE WIRE/FFR STUDY;  Surgeon: Nigel Mormon, MD;  Location: [place] [facility];  Service: Cardiovascular;  Laterality: N/A;  . LEFT HEART CATH AND CORONARY ANGIOGRAPHY N/A 04/01/2018   Procedure: LEFT HEART CATH AND CORONARY ANGIOGRAPHY;  Surgeon: Nigel Mormon, MD;  Location: [place] [facility];  Service: Cardiovascular;  Laterality: N/A;  . TUBAL LIGATION  1975     Current Outpatient Medications:  .  albuterol (PROVENTIL  HFA;VENTOLIN HFA) 108 (90 Base) MCG/ACT inhaler, Inhale 1 puff into the lungs every 4 (four) hours as needed., Disp: , Rfl:  .  alendronate (FOSAMAX) 70 MG tablet, Take 1 tablet by mouth once a week., Disp: , Rfl:  .  aspirin EC 81 MG tablet, Take 81 mg by mouth daily., Disp: , Rfl:  .  calcium citrate-vitamin D 500-400 MG-UNIT chewable tablet, Chew 1 tablet by mouth 2 (two) times daily. , Disp: , Rfl:  .  carboxymethylcellulose (REFRESH PLUS) 0.5 % SOLN, Place 1-2 drops into both eyes 3 (three) times daily. , Disp: , Rfl:  .  Cholecalciferol (VITAMIN D3) 10 MCG (400 UNIT) CAPS, Take by mouth daily. 4 daily, Disp: , Rfl:  .  cloNIDine (CATAPRES) 0.1 MG tablet, Take 0.1 mg by mouth at bedtime and may repeat dose one time if needed. , Disp: , Rfl: 3 .  Coenzyme Q10 (COQ10) 200 MG CAPS, Take 200 mg by mouth daily., Disp: , Rfl:  .  cycloSPORINE (RESTASIS) 0.05 % ophthalmic emulsion, Place 1 drop into both eyes 2 (two) times daily. , Disp: , Rfl:  .  Emollient (CERAVE EX), Apply 1 application topically See admin instructions. Apply to affected areas daily as directed or as needed for dryness, Disp: , Rfl:  .  fluticasone (FLONASE) 50 MCG/ACT nasal spray, Place 1 spray into both nostrils at bedtime as needed for allergies or rhinitis., Disp: , Rfl:  .  Garlic 1696 MG CAPS, Take 1  capsule by mouth daily., Disp: , Rfl:  .  hydroquinone 4 % cream, Apply to face nightly, Disp: , Rfl:  .  isosorbide-hydrALAZINE (BIDIL) 20-37.5 MG tablet, Take 1 tablet by mouth 3 (three) times daily., Disp: 180 tablet, Rfl: 2 .  KRILL OIL PO, Take 1 capsule by mouth daily., Disp: , Rfl:  .  Magnesium 250 MG TABS, Take 1 tablet by mouth daily., Disp: , Rfl:  .  Multiple Vitamins-Minerals (MULTIVITAMIN WITH MINERALS) tablet, Take 1 tablet by mouth daily. Centrum for Women, Disp: , Rfl:  .  omeprazole (PRILOSEC OTC) 20 MG tablet, Take 20 mg by mouth daily as needed (for reflux). , Disp: , Rfl:  .  Pitavastatin Calcium (LIVALO) 1  MG TABS, Take 1 mg by mouth daily., Disp: , Rfl:  .  polyethylene glycol (MIRALAX / GLYCOLAX) packet, Take 17 g by mouth at bedtime. , Disp: , Rfl:  .  pyridOXINE (VITAMIN B-6) 100 MG tablet, Take 100 mg by mouth daily. , Disp: , Rfl:  .  spironolactone (ALDACTONE) 50 MG tablet, Take 50 mg by mouth daily. , Disp: , Rfl:  .  triamcinolone cream (KENALOG) 0.1 %, Apply 1 application topically 2 (two) times daily as needed (rash/ eczema). , Disp: , Rfl: 2 .  vitamin B-12 (CYANOCOBALAMIN) 1000 MCG tablet, Take 2,500 mcg by mouth daily. , Disp: , Rfl:  .  vitamin E 100 UNIT capsule, Take 100 Units by mouth daily. , Disp: , Rfl:  .  Wheat Dextrin (BENEFIBER) POWD, Take 2 application by mouth as needed. , Disp: , Rfl:   Allergies  Allergen Reactions  . Naproxen Other (See Comments)    Patient preference   . Prednisone Other (See Comments)    Patient Preference   . Amlodipine Hives  . Losartan Rash    Objective: 78 y.o. AAF, WD, WN in NAD. AAO x 3. Vitals:   11/16/18 0832  Temp: 97.7 F (36.5 C)    Vascular Examination: Capillary refill time <3 seconds x 10 digits.  Dorsalis pedis pulses present b/l.  Posterior tibial pulses present b/l.  No digital hair x 10 digits.  Skin temperature warm to cool b/l.  Dermatological Examination: Skin with normal turgor, texture and tone b/l.  Toenails 1-5 b/l discolored, thick, dystrophic with subungual debris and pain with palpation to nailbeds due to thickness of nails.  Porokeratotic lesion posterior heel left foot with tenderness to palpation. No erythema, no edema, no drainage, no flocculence noted.   Musculoskeletal: Muscle strength 5/5 to all LE muscle groups.  Hammertoe deformity digits 2-5 b/l.  Neurological: Sensation diminished with 10 gram monofilament.  Vibratory sensation diminished.  Assessment: 1. Painful onychomycosis toenails 1-5 b/l 2. Porokeratosis posterior left heel 3. NIDDM with  Neuropathy  Plan: 1. Continue diabetic foot care principles daily. Literature dispensed. 2. Toenails 1-5 b/l were debrided in length and girth without iatrogenic bleeding. Discussed laser therapy for onychomycosis. Gave her a business card to schedule an appointment with our RN for laser therapy if she would like to proceed. 3. Porokeratosis pared and enucleated left heel utilizing sterile scalpel blade without incident. 4. Patient to continue soft, supportive shoe gear daily. 5. Patient to report any pedal injuries to medical professional immediately. 6. Follow up 3 months.  7. Patient/POA to call should there be a concern in the interim.

## 2018-11-24 ENCOUNTER — Telehealth: Payer: Self-pay

## 2018-11-24 DIAGNOSIS — L309 Dermatitis, unspecified: Secondary | ICD-10-CM | POA: Diagnosis not present

## 2018-11-24 NOTE — Telephone Encounter (Signed)
error 

## 2018-11-27 ENCOUNTER — Encounter: Payer: Self-pay | Admitting: Cardiology

## 2018-11-27 ENCOUNTER — Other Ambulatory Visit: Payer: Self-pay

## 2018-11-27 ENCOUNTER — Ambulatory Visit (INDEPENDENT_AMBULATORY_CARE_PROVIDER_SITE_OTHER): Payer: Medicare Other | Admitting: Cardiology

## 2018-11-27 VITALS — BP 147/81 | HR 76 | Ht 66.0 in | Wt 147.0 lb

## 2018-11-27 DIAGNOSIS — R0602 Shortness of breath: Secondary | ICD-10-CM | POA: Diagnosis not present

## 2018-11-27 DIAGNOSIS — I2510 Atherosclerotic heart disease of native coronary artery without angina pectoris: Secondary | ICD-10-CM

## 2018-11-27 DIAGNOSIS — I10 Essential (primary) hypertension: Secondary | ICD-10-CM | POA: Diagnosis not present

## 2018-11-27 MED ORDER — BIDIL 20-37.5 MG PO TABS: 2.0000 | ORAL_TABLET | Freq: Two times a day (BID) | ORAL | 2 refills | Status: DC

## 2018-11-27 NOTE — Progress Notes (Signed)
Virtual Visit via Video Note   Subjective:   Sara Brown, female    DOB: 08-19-1940, 78 y.o.   MRN: [id number]    Chief complaint:  Hypertension   HPI  78 y/o African-American female with hypertension, diet controlled diabetes, hyperlipidemia, GERD and remote history of TIA, mild nonobstructive coronary artery disease (Cath 04/01/2018).  I had a virtual visit with patient 2 weeks ago.  I had made changes to her antihypertensive regimen and recommended follow-up and 3-6 months.  Patient made an appointment today given concern with elevated blood pressure.  Patient has had intolerance to multiple medications.  She continues to have elevated blood pressure between her scheduled doses of antihypertensive medications.  After having completed her visit today, patient came back complaining that she was "not feeling well" and having difficulty breathing.   Past Medical History:  Diagnosis Date  . Allergic rhinitis   . Arthritis    "fingers, knees" (02/09/2018)  . Asthma    "stopped taking RX after dr said I don't have this" (02/09/2018)  . Carotid stenosis    ICA(L)  . Complication of anesthesia    "felt the cut w/one of my c-sections" (02/09/2018)  . Constipation   . GERD (gastroesophageal reflux disease)   . Headache    "years ago; before menopause" (02/09/2018)  . Hyperlipidemia   . Hypertension   . Neuropathy    face (R)  . OA (osteoarthritis)   . Osteoporosis   . TIA (transient ischemic attack) ?2005  . Type II diabetes mellitus ([place])   . Vitamin D deficiency      Past Surgical History:  Procedure Laterality Date  . CATARACT EXTRACTION W/ INTRAOCULAR LENS  IMPLANT, BILATERAL Bilateral   . CESAREAN SECTION  1971; 1975  . INTRAVASCULAR PRESSURE WIRE/FFR STUDY N/A 04/01/2018   Procedure: INTRAVASCULAR PRESSURE WIRE/FFR STUDY;  Surgeon: Nigel Mormon, MD;  Location: [place] [facility];  Service: Cardiovascular;  Laterality: N/A;  . LEFT HEART CATH AND  CORONARY ANGIOGRAPHY N/A 04/01/2018   Procedure: LEFT HEART CATH AND CORONARY ANGIOGRAPHY;  Surgeon: Nigel Mormon, MD;  Location: [place] [facility];  Service: Cardiovascular;  Laterality: N/A;  . TUBAL LIGATION  1975     Social History   Socioeconomic History  . Marital status: Married    Spouse name: Bland Span  . Number of children: 2  . Years of education: 16  . Highest education level: Bachelor's degree (e.g., BA, AB, BS)  Occupational History    Comment: employed at home  Social Needs  . Financial resource strain: Not hard at all  . Food insecurity    Worry: Never true    Inability: Never true  . Transportation needs    Medical: No    Non-medical: No  Tobacco Use  . Smoking status: Never Smoker  . Smokeless tobacco: Never Used  Substance and Sexual Activity  . Alcohol use: Never    Frequency: Never  . Drug use: Never  . Sexual activity: Not Currently  Lifestyle  . Physical activity    Days per week: 1 day    Minutes per session: 30 min  . Stress: Not at all  Relationships  . Social connections    Talks on phone: More than three times a week    Gets together: More than three times a week    Attends religious service: 1 to 4 times per year    Active member of club or organization: No    Attends  meetings of clubs or organizations: Never    Relationship status: Married  . Intimate partner violence    Fear of current or ex partner: No    Emotionally abused: No    Physically abused: No    Forced sexual activity: No  Other Topics Concern  . Not on file  Social History Narrative   Consumes no caffeine     Family History  Problem Relation Age of Onset  . Diabetes Mellitus II Mother   . Osteoporosis Mother   . Hypertension Mother   . Diabetes Mellitus II Father   . CVA Sister        4  . CAD Sister        4  . Hypertension Sister        4  . Osteoarthritis Sister        4  . Alcoholism Brother   . Breast cancer Cousin 25     Current  Outpatient Medications on File Prior to Visit  Medication Sig Dispense Refill  . albuterol (PROVENTIL HFA;VENTOLIN HFA) 108 (90 Base) MCG/ACT inhaler Inhale 1 puff into the lungs every 4 (four) hours as needed.    Marland Kitchen alendronate (FOSAMAX) 70 MG tablet Take 1 tablet by mouth once a week.    Marland Kitchen aspirin EC 81 MG tablet Take 81 mg by mouth daily.    . calcium citrate-vitamin D 500-400 MG-UNIT chewable tablet Chew 1 tablet by mouth 2 (two) times daily.     . carboxymethylcellulose (REFRESH PLUS) 0.5 % SOLN Place 1-2 drops into both eyes 3 (three) times daily.     . Cholecalciferol (VITAMIN D3) 10 MCG (400 UNIT) CAPS Take by mouth daily. 4 daily    . cloNIDine (CATAPRES) 0.1 MG tablet Take 0.1 mg by mouth at bedtime and may repeat dose one time if needed.   3  . Coenzyme Q10 (COQ10) 200 MG CAPS Take 200 mg by mouth as needed.     . cycloSPORINE (RESTASIS) 0.05 % ophthalmic emulsion Place 1 drop into both eyes 2 (two) times daily.     . Emollient (CERAVE EX) Apply 1 application topically See admin instructions. Apply to affected areas daily as directed or as needed for dryness    . fluticasone (FLONASE) 50 MCG/ACT nasal spray Place 1 spray into both nostrils at bedtime as needed for allergies or rhinitis.    . Garlic 9163 MG CAPS Take 1 capsule by mouth daily.    . hydroquinone 4 % cream Apply to face nightly    . Magnesium 250 MG TABS Take 1 tablet by mouth daily.    . Multiple Vitamins-Minerals (MULTIVITAMIN WITH MINERALS) tablet Take 1 tablet by mouth daily. Centrum for Women    . omeprazole (PRILOSEC OTC) 20 MG tablet Take 20 mg by mouth daily as needed (for reflux).     . Pitavastatin Calcium (LIVALO) 1 MG TABS Take 1 mg by mouth daily.    . polyethylene glycol (MIRALAX / GLYCOLAX) packet Take 17 g by mouth at bedtime.     . pyridOXINE (VITAMIN B-6) 100 MG tablet Take 100 mg by mouth daily.     Marland Kitchen spironolactone (ALDACTONE) 50 MG tablet Take 50 mg by mouth daily.     Marland Kitchen triamcinolone cream (KENALOG)  0.1 % Apply 1 application topically 2 (two) times daily as needed (rash/ eczema).   2  . Wheat Dextrin (BENEFIBER) POWD Take 2 application by mouth as needed.      No current facility-administered medications  on file prior to visit.     Cardiovascular studies:  Cath 04/01/2018: LM: Normal LAD: Prox-mid 40% stenosis. iFR 0.99 (Physiologically nonsignificant) LCx: Dominant. Small branches with mild disease RCA: Nondominant. Normal   LVEDP mildly elevated.   Carotid artery duplex 02/23/2018: No hemodynamically significant arterial disease in the internal carotid artery bilaterally. No significant plaque burden. Antegrade right vertebral artery flow. Antegrade left vertebral artery flow.  Echocardiogram 10/29/2017: Left ventricle cavity is normal in size. Mild concentric hypertrophy of the left ventricle. Normal global wall motion. Normal diastolic filling pattern. Calculated EF 69%. No significant valvular abnormality.  Insignificant pericardial effusion.  Exercise myoview stress 10/20/2017: 1. The resting electrocardiogram demonstrated normal sinus rhythm, normal resting conduction and no resting arrhythmias.  The stress electrocardiogram was normal.  Patient exercised on Bruce protocol for 7:43 minutes and achieved 8.54 METS. Stress test terminated due to mid sternal chest pain and 90% % MPHR achieved (Target HR >85%). Normal BP. 2.  Myocardial perfusion imaging is normal. Overall left ventricular systolic function was normal without regional wall motion abnormalities. The left ventricular ejection fraction was 76%.  This is a low risk study.  Recent labs: Labs 06/08/2018: Glucose 97.  BUN/creatinine 19/0.87. EGFR normal.  Sodium 139, potassium 4.3.   H/H 13/41.  MCV 91.  Platelets 236  Review of Systems  Constitutional: Negative.   HENT: Negative for nosebleeds.   Eyes: Negative for blurred vision.  Respiratory: Positive for shortness of breath.   Cardiovascular: Negative for  chest pain.  Gastrointestinal: Negative for abdominal pain, nausea and vomiting.  Genitourinary: Negative for dysuria.  Musculoskeletal: Negative for myalgias.  Skin: Negative for itching and rash.  Neurological: Negative for dizziness and loss of consciousness.  Endo/Heme/Allergies: Does not bruise/bleed easily.  Psychiatric/Behavioral: The patient is not nervous/anxious.   All other systems reviewed and are negative.       Vitals:   11/27/18 1201 11/27/18 1306  BP: (!) 163/86 (!) 147/81  Pulse: 76   SpO2: 97%     Objective:     Physical exam: Physical Exam  Constitutional: She is oriented to person, place, and time. She appears well-developed and well-nourished. No distress.  HENT:  Head: Normocephalic and atraumatic.  Eyes: Pupils are equal, round, and reactive to light. Conjunctivae are normal.  Neck: No JVD present.  Cardiovascular: Normal rate, regular rhythm and intact distal pulses.  Pulmonary/Chest: Effort normal and breath sounds normal. She has no wheezes. She has no rales.  Abdominal: Soft. Bowel sounds are normal. There is no rebound.  Musculoskeletal:        General: No edema.  Lymphadenopathy:    She has no cervical adenopathy.  Neurological: She is alert and oriented to person, place, and time. No cranial nerve deficit.  Skin: Skin is warm and dry.  Psychiatric: She has a normal mood and affect.  Nursing note and vitals reviewed.        Assessment & Recommendations:   78 y/o African-American female with hypertension, diet controlled diabetes, hyperlipidemia, GERD and remote history of TIA, mild nonobstructive coronary artery disease (Cath 04/01/2018).  Uncontrolled hypertension: Intoelrane to several antihypertenaive medications. Increased Bidil dose.  Recommend spacing out her medications as follows.    Bidil 2 tab 20-37.5 mg in am Spironolactone 50 mg around noon Bidil 2 tab 20-37.5 mg afternoon Clonidine 0.1 mg at bedtime Additional 1-2 tab  of Bidil during th daytime for episodes of elevated BP.   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  Physical exam was normal.  I suggested to the patient that she goes to the emergency room.  However, she did not want to go to emergency room, but she preferred to go home and monitor symptoms. She knows to call 911, in case of worsening symptoms.   Nonobstructive coronary artery disease: Continue risk factor modification with BP and lipid lowering therapy. Continue aspirin 81 mg daily.  Total time spent with patient was 45 minutes and greater than 50% of that time was spent in counseling and coordination care with the patient regarding complex decision making and discussion as state above.  Nigel Mormon, MD [facility] Pager: [phone] Office: [phone] If no answer Cell [phone]

## 2018-11-27 NOTE — Patient Instructions (Addendum)
Take your medications as follows  BiDil 2 tab 20-37.5 mg in the morning around 8 AM, and afternoon around 4 PM Take spironolactone 1 tab 50 mg around noon Take clonidine 0.1 mg at bedtime You may take additional 1-2 tab of BiDil during the day, in case of headaches associated with high blood pressure. Check your blood pressure no more than 2 times a day.

## 2018-12-31 DIAGNOSIS — E1165 Type 2 diabetes mellitus with hyperglycemia: Secondary | ICD-10-CM | POA: Diagnosis not present

## 2018-12-31 DIAGNOSIS — I10 Essential (primary) hypertension: Secondary | ICD-10-CM | POA: Diagnosis not present

## 2018-12-31 DIAGNOSIS — E1169 Type 2 diabetes mellitus with other specified complication: Secondary | ICD-10-CM | POA: Diagnosis not present

## 2018-12-31 DIAGNOSIS — I700 Atherosclerosis of aorta: Secondary | ICD-10-CM | POA: Diagnosis not present

## 2018-12-31 DIAGNOSIS — G459 Transient cerebral ischemic attack, unspecified: Secondary | ICD-10-CM | POA: Diagnosis not present

## 2019-02-09 DIAGNOSIS — I10 Essential (primary) hypertension: Secondary | ICD-10-CM | POA: Diagnosis not present

## 2019-02-09 DIAGNOSIS — Z86010 Personal history of colonic polyps: Secondary | ICD-10-CM | POA: Diagnosis not present

## 2019-02-09 DIAGNOSIS — E119 Type 2 diabetes mellitus without complications: Secondary | ICD-10-CM | POA: Diagnosis not present

## 2019-02-09 DIAGNOSIS — Z1211 Encounter for screening for malignant neoplasm of colon: Secondary | ICD-10-CM | POA: Diagnosis not present

## 2019-02-09 DIAGNOSIS — K589 Irritable bowel syndrome without diarrhea: Secondary | ICD-10-CM | POA: Diagnosis not present

## 2019-02-09 DIAGNOSIS — K5900 Constipation, unspecified: Secondary | ICD-10-CM | POA: Diagnosis not present

## 2019-02-17 DIAGNOSIS — Z1211 Encounter for screening for malignant neoplasm of colon: Secondary | ICD-10-CM | POA: Diagnosis not present

## 2019-02-19 ENCOUNTER — Ambulatory Visit: Payer: Medicare Other | Admitting: Cardiology

## 2019-02-22 ENCOUNTER — Encounter: Payer: Self-pay | Admitting: Podiatry

## 2019-02-22 ENCOUNTER — Other Ambulatory Visit: Payer: Self-pay

## 2019-02-22 ENCOUNTER — Ambulatory Visit (INDEPENDENT_AMBULATORY_CARE_PROVIDER_SITE_OTHER): Payer: Medicare Other | Admitting: Podiatry

## 2019-02-22 DIAGNOSIS — D229 Melanocytic nevi, unspecified: Secondary | ICD-10-CM

## 2019-02-22 DIAGNOSIS — B351 Tinea unguium: Secondary | ICD-10-CM

## 2019-02-22 DIAGNOSIS — M79675 Pain in left toe(s): Secondary | ICD-10-CM | POA: Diagnosis not present

## 2019-02-22 DIAGNOSIS — E1142 Type 2 diabetes mellitus with diabetic polyneuropathy: Secondary | ICD-10-CM

## 2019-02-22 DIAGNOSIS — Q828 Other specified congenital malformations of skin: Secondary | ICD-10-CM

## 2019-02-22 DIAGNOSIS — M79674 Pain in right toe(s): Secondary | ICD-10-CM

## 2019-02-22 NOTE — Progress Notes (Signed)
Subjective: Sara Brown presents today with painful, thick toenails 1-5 b/l that she cannot cut and which interfere with daily activities.  Pain is aggravated when wearing enclosed shoe gear.  Seward Carol, MD is her PCP.   Patient states she trimmed her great toenails a few weeks ago because they were becoming uncomfortable in her shoes. She states she did not injure herself during trimming.   Current Outpatient Medications:  .  albuterol (PROVENTIL HFA;VENTOLIN HFA) 108 (90 Base) MCG/ACT inhaler, Inhale 1 puff into the lungs every 4 (four) hours as needed., Disp: , Rfl:  .  alendronate (FOSAMAX) 70 MG tablet, Take 1 tablet by mouth once a week., Disp: , Rfl:  .  aspirin EC 81 MG tablet, Take 81 mg by mouth daily., Disp: , Rfl:  .  calcium citrate-vitamin D 500-400 MG-UNIT chewable tablet, Chew 1 tablet by mouth 2 (two) times daily. , Disp: , Rfl:  .  carboxymethylcellulose (REFRESH PLUS) 0.5 % SOLN, Place 1-2 drops into both eyes 3 (three) times daily. , Disp: , Rfl:  .  Cholecalciferol (VITAMIN D3) 10 MCG (400 UNIT) CAPS, Take by mouth daily. 4 daily, Disp: , Rfl:  .  cloNIDine (CATAPRES) 0.1 MG tablet, Take 0.1 mg by mouth at bedtime and may repeat dose one time if needed. , Disp: , Rfl: 3 .  Coenzyme Q10 (COQ10) 200 MG CAPS, Take 200 mg by mouth as needed. , Disp: , Rfl:  .  cycloSPORINE (RESTASIS) 0.05 % ophthalmic emulsion, Place 1 drop into both eyes 2 (two) times daily. , Disp: , Rfl:  .  Emollient (CERAVE EX), Apply 1 application topically See admin instructions. Apply to affected areas daily as directed or as needed for dryness, Disp: , Rfl:  .  fluticasone (FLONASE) 50 MCG/ACT nasal spray, Place 1 spray into both nostrils at bedtime as needed for allergies or rhinitis., Disp: , Rfl:  .  Garlic 123XX123 MG CAPS, Take 1 capsule by mouth daily., Disp: , Rfl:  .  halobetasol (ULTRAVATE) 0.05 % ointment, APPLY TO AFFECTED AREA AT BEDTIME AFTER BATHING AS DIRECTED, Disp: , Rfl:   .  hydroquinone 4 % cream, Apply to face nightly, Disp: , Rfl:  .  isosorbide-hydrALAZINE (BIDIL) 20-37.5 MG tablet, Take 2 tablets by mouth 2 (two) times a day. May take additional 1-2 tablets in addition, as needed for hypertension, Disp: 180 tablet, Rfl: 2 .  Magnesium 250 MG TABS, Take 1 tablet by mouth daily., Disp: , Rfl:  .  Multiple Vitamins-Minerals (MULTIVITAMIN WITH MINERALS) tablet, Take 1 tablet by mouth daily. Centrum for Women, Disp: , Rfl:  .  omeprazole (PRILOSEC OTC) 20 MG tablet, Take 20 mg by mouth daily as needed (for reflux). , Disp: , Rfl:  .  Pitavastatin Calcium (LIVALO) 1 MG TABS, Take 1 mg by mouth daily., Disp: , Rfl:  .  polyethylene glycol (MIRALAX / GLYCOLAX) packet, Take 17 g by mouth at bedtime. , Disp: , Rfl:  .  pyridOXINE (VITAMIN B-6) 100 MG tablet, Take 100 mg by mouth daily. , Disp: , Rfl:  .  spironolactone (ALDACTONE) 50 MG tablet, Take 50 mg by mouth daily. , Disp: , Rfl:  .  triamcinolone cream (KENALOG) 0.1 %, Apply 1 application topically 2 (two) times daily as needed (rash/ eczema). , Disp: , Rfl: 2 .  Wheat Dextrin (BENEFIBER) POWD, Take 2 application by mouth as needed. , Disp: , Rfl:   Allergies  Allergen Reactions  . Naproxen Other (See Comments)  Patient preference   . Prednisone Other (See Comments)    Patient Preference   . Amlodipine Hives  . Losartan Rash    Objective: 78 y.o. AAF, WD, WN in NAD.  Pleasant, conversant and cooperative.  Vascular Examination: Capillary refill time immediate x 10 digits.  Dorsalis pedis and Posterior tibial pulses palpable b/l.  Digital hair absent x 10 digits.  Skin temperature gradient WNL b/l.  Dermatological Examination: Skin with normal turgor, texture and tone b/l.  Toenails 1-5 b/l discolored, thick, dystrophic with subungual debris and pain with palpation to nailbeds due to thickness of nails.  Macule noted on lateral aspect of left heel with area of centralized hyperpigmentation  with asymmetry. Borders normal. No depth, no elevation, no nodularity, no bleeding.  Small porokeratotic lesion left plantarlateral arch. No erythema, no edema, no drainage, no flocculence.  Musculoskeletal: Muscle strength 5/5 to all LE muscle groups.  Ambulates independently without assistive aids.  Hammertoes b/l.  No pain, crepitus or joint limitation noted with ROM.   Neurological: Sensation diminished with 10 gram monofilament.  Assessment: Painful onychomycosis toenails 1-5 b/l  Macule left lateral heel: suspicious Porokeratosis left foot NIDDM with neuropathy  Plan: 1. Toenails 1-5 b/l were debrided in length and girth without iatrogenic bleeding. 2. We discussed lesion on left lateral heel and I have suggested she have her Dermatologist assess it to rule out skin cancer. She will call today and schedule an appointment with her Dermatologist. 3. Patient to continue soft, supportive shoe gear daily. 4. Patient to report any pedal injuries to medical professional immediately. 5. We will have her come in sooner for her next appointment. Follow up 10 weeks. 6. Patient/POA to call should there be a concern in the interim.

## 2019-02-22 NOTE — Patient Instructions (Signed)
Diabetes Mellitus and Foot Care Foot care is an important part of your health, especially when you have diabetes. Diabetes may cause you to have problems because of poor blood flow (circulation) to your feet and legs, which can cause your skin to:  Become thinner and drier.  Break more easily.  Heal more slowly.  Peel and crack. You may also have nerve damage (neuropathy) in your legs and feet, causing decreased feeling in them. This means that you may not notice minor injuries to your feet that could lead to more serious problems. Noticing and addressing any potential problems early is the best way to prevent future foot problems. How to care for your feet Foot hygiene  Wash your feet daily with warm water and mild soap. Do not use hot water. Then, pat your feet and the areas between your toes until they are completely dry. Do not soak your feet as this can dry your skin.  Trim your toenails straight across. Do not dig under them or around the cuticle. File the edges of your nails with an emery board or nail file.  Apply a moisturizing lotion or petroleum jelly to the skin on your feet and to dry, brittle toenails. Use lotion that does not contain alcohol and is unscented. Do not apply lotion between your toes. Shoes and socks  Wear clean socks or stockings every day. Make sure they are not too tight. Do not wear knee-high stockings since they may decrease blood flow to your legs.  Wear shoes that fit properly and have enough cushioning. Always look in your shoes before you put them on to be sure there are no objects inside.  To break in new shoes, wear them for just a few hours a day. This prevents injuries on your feet. Wounds, scrapes, corns, and calluses  Check your feet daily for blisters, cuts, bruises, sores, and redness. If you cannot see the bottom of your feet, use a mirror or ask someone for help.  Do not cut corns or calluses or try to remove them with medicine.  If you  find a minor scrape, cut, or break in the skin on your feet, keep it and the skin around it clean and dry. You may clean these areas with mild soap and water. Do not clean the area with peroxide, alcohol, or iodine.  If you have a wound, scrape, corn, or callus on your foot, look at it several times a day to make sure it is healing and not infected. Check for: ? Redness, swelling, or pain. ? Fluid or blood. ? Warmth. ? Pus or a bad smell. General instructions  Do not cross your legs. This may decrease blood flow to your feet.  Do not use heating pads or hot water bottles on your feet. They may burn your skin. If you have lost feeling in your feet or legs, you may not know this is happening until it is too late.  Protect your feet from hot and cold by wearing shoes, such as at the beach or on hot pavement.  Schedule a complete foot exam at least once a year (annually) or more often if you have foot problems. If you have foot problems, report any cuts, sores, or bruises to your health care provider immediately. Contact a health care provider if:  You have a medical condition that increases your risk of infection and you have any cuts, sores, or bruises on your feet.  You have an injury that is not   healing.  You have redness on your legs or feet.  You feel burning or tingling in your legs or feet.  You have pain or cramps in your legs and feet.  Your legs or feet are numb.  Your feet always feel cold.  You have pain around a toenail. Get help right away if:  You have a wound, scrape, corn, or callus on your foot and: ? You have pain, swelling, or redness that gets worse. ? You have fluid or blood coming from the wound, scrape, corn, or callus. ? Your wound, scrape, corn, or callus feels warm to the touch. ? You have pus or a bad smell coming from the wound, scrape, corn, or callus. ? You have a fever. ? You have a red line going up your leg. Summary  Check your feet every day  for cuts, sores, red spots, swelling, and blisters.  Moisturize feet and legs daily.  Wear shoes that fit properly and have enough cushioning.  If you have foot problems, report any cuts, sores, or bruises to your health care provider immediately.  Schedule a complete foot exam at least once a year (annually) or more often if you have foot problems. This information is not intended to replace advice given to you by your health care provider. Make sure you discuss any questions you have with your health care provider. Document Released: 05/24/2000 Document Revised: 07/09/2017 Document Reviewed: 06/28/2016 Elsevier Patient Education  2020 [employer]   Onychomycosis/Fungal Toenails  WHAT IS IT? An infection that lies within the keratin of your nail plate that is caused by a fungus.  WHY ME? Fungal infections affect all ages, sexes, races, and creeds.  There may be many factors that predispose you to a fungal infection such as age, coexisting medical conditions such as diabetes, or an autoimmune disease; stress, medications, fatigue, genetics, etc.  Bottom line: fungus thrives in a warm, moist environment and your shoes offer such a location.  IS IT CONTAGIOUS? Theoretically, yes.  You do not want to share shoes, nail clippers or files with someone who has fungal toenails.  Walking around barefoot in the same room or sleeping in the same bed is unlikely to transfer the organism.  It is important to realize, however, that fungus can spread easily from one nail to the next on the same foot.  HOW DO WE TREAT THIS?  There are several ways to treat this condition.  Treatment may depend on many factors such as age, medications, pregnancy, liver and kidney conditions, etc.  It is best to ask your doctor which options are available to you.  1. No treatment.   Unlike many other medical concerns, you can live with this condition.  However for many people this can be a painful condition and may lead to  ingrown toenails or a bacterial infection.  It is recommended that you keep the nails cut short to help reduce the amount of fungal nail. 2. Topical treatment.  These range from herbal remedies to prescription strength nail lacquers.  About 40-50% effective, topicals require twice daily application for approximately 9 to 12 months or until an entirely new nail has grown out.  The most effective topicals are medical grade medications available through physicians offices. 3. Oral antifungal medications.  With an 80-90% cure rate, the most common oral medication requires 3 to 4 months of therapy and stays in your system for a year as the new nail grows out.  Oral antifungal medications do require   blood work to make sure it is a safe drug for you.  A liver function panel will be performed prior to starting the medication and after the first month of treatment.  It is important to have the blood work performed to avoid any harmful side effects.  In general, this medication safe but blood work is required. 4. Laser Therapy.  This treatment is performed by applying a specialized laser to the affected nail plate.  This therapy is noninvasive, fast, and non-painful.  It is not covered by insurance and is therefore, out of pocket.  The results have been very good with a 80-95% cure rate.  [facility] is the only practice in the area to offer this therapy. 5. Permanent Nail Avulsion.  Removing the entire nail so that a new nail will not grow back. 

## 2019-02-23 ENCOUNTER — Telehealth (INDEPENDENT_AMBULATORY_CARE_PROVIDER_SITE_OTHER): Payer: Medicare Other | Admitting: Cardiology

## 2019-02-23 VITALS — BP 119/70 | HR 65

## 2019-02-23 DIAGNOSIS — I10 Essential (primary) hypertension: Secondary | ICD-10-CM

## 2019-02-23 DIAGNOSIS — I2510 Atherosclerotic heart disease of native coronary artery without angina pectoris: Secondary | ICD-10-CM | POA: Diagnosis not present

## 2019-02-23 NOTE — Progress Notes (Signed)
Virtual Visit via Video Note   Subjective:   Sara Brown, female    DOB: 08-24-1940, 78 y.o.   MRN: [id number]  I connected with the patient on 02/23/2019 by a video enabled telemedicine application and verified that I am speaking with the correct person using two identifiers.     I discussed the limitations of evaluation and management by telemedicine and the availability of in person appointments. The patient expressed understanding and agreed to proceed.   This visit type was conducted due to national recommendations for restrictions regarding the COVID-19 Pandemic (e.g. social distancing).  This format is felt to be most appropriate for this patient at this time.  All issues noted in this document were discussed and addressed.  No physical exam was performed (except for noted visual exam findings with Tele health visits).  The patient has consented to conduct a Tele health visit and understands insurance will be billed.   Chief complaint:  Hypertension   HPI  78 y/o African-American female with hypertension, diet controlled diabetes, hyperlipidemia, GERD and remote history of TIA, mild nonobstructive coronary artery disease (Cath 04/01/2018).  Patient is doing well with blood pressure control and most checks.  She is staying active with yard work.  She just returned from working out in the yard.  As result, blood pressure is elevated, but has been lower previously, as recently as yesterday.  Patient is overall pleased with her general health and does not have any new complaints at this time.  Past Medical History:  Diagnosis Date  . Allergic rhinitis   . Arthritis    "fingers, knees" (02/09/2018)  . Asthma    "stopped taking RX after dr said I don't have this" (02/09/2018)  . Carotid stenosis    ICA(L)  . Complication of anesthesia    "felt the cut w/one of my c-sections" (02/09/2018)  . Constipation   . GERD (gastroesophageal reflux disease)   . Headache    "years ago;  before menopause" (02/09/2018)  . Hyperlipidemia   . Hypertension   . Neuropathy    face (R)  . OA (osteoarthritis)   . Osteoporosis   . TIA (transient ischemic attack) ?2005  . Type II diabetes mellitus ([place])   . Vitamin D deficiency      Past Surgical History:  Procedure Laterality Date  . CATARACT EXTRACTION W/ INTRAOCULAR LENS  IMPLANT, BILATERAL Bilateral   . CESAREAN SECTION  1971; 1975  . INTRAVASCULAR PRESSURE WIRE/FFR STUDY N/A 04/01/2018   Procedure: INTRAVASCULAR PRESSURE WIRE/FFR STUDY;  Surgeon: Nigel Mormon, MD;  Location: [facility] [facility];  Service: Cardiovascular;  Laterality: N/A;  . LEFT HEART CATH AND CORONARY ANGIOGRAPHY N/A 04/01/2018   Procedure: LEFT HEART CATH AND CORONARY ANGIOGRAPHY;  Surgeon: Nigel Mormon, MD;  Location: [facility];  Service: Cardiovascular;  Laterality: N/A;  . TUBAL LIGATION  1975     Social History   Socioeconomic History  . Marital status: Married    Spouse name: Bland Span  . Number of children: 2  . Years of education: 16  . Highest education level: Bachelor's degree (e.g., BA, AB, BS)  Occupational History    Comment: employed at home  Social Needs  . Financial resource strain: Not hard at all  . Food insecurity    Worry: Never true    Inability: Never true  . Transportation needs    Medical: No    Non-medical: No  Tobacco Use  . Smoking status: Never  Smoker  . Smokeless tobacco: Never Used  Substance and Sexual Activity  . Alcohol use: Never    Frequency: Never  . Drug use: Never  . Sexual activity: Not Currently  Lifestyle  . Physical activity    Days per week: 1 day    Minutes per session: 30 min  . Stress: Not at all  Relationships  . Social connections    Talks on phone: More than three times a week    Gets together: More than three times a week    Attends religious service: 1 to 4 times per year    Active member of club or organization: No    Attends meetings of clubs or  organizations: Never    Relationship status: Married  . Intimate partner violence    Fear of current or ex partner: No    Emotionally abused: No    Physically abused: No    Forced sexual activity: No  Other Topics Concern  . Not on file  Social History Narrative   Consumes no caffeine     Family History  Problem Relation Age of Onset  . Diabetes Mellitus II Mother   . Osteoporosis Mother   . Hypertension Mother   . Diabetes Mellitus II Father   . CVA Sister        4  . CAD Sister        4  . Hypertension Sister        4  . Osteoarthritis Sister        4  . Alcoholism Brother   . Breast cancer Cousin 73     Current Outpatient Medications on File Prior to Visit  Medication Sig Dispense Refill  . albuterol (PROVENTIL HFA;VENTOLIN HFA) 108 (90 Base) MCG/ACT inhaler Inhale 1 puff into the lungs every 4 (four) hours as needed.    Marland Kitchen alendronate (FOSAMAX) 70 MG tablet Take 1 tablet by mouth once a week.    Marland Kitchen aspirin EC 81 MG tablet Take 81 mg by mouth daily.    . calcium citrate-vitamin D 500-400 MG-UNIT chewable tablet Chew 1 tablet by mouth 2 (two) times daily.     . carboxymethylcellulose (REFRESH PLUS) 0.5 % SOLN Place 1-2 drops into both eyes 3 (three) times daily.     . Cholecalciferol (VITAMIN D3) 10 MCG (400 UNIT) CAPS Take by mouth daily. 4 daily    . cloNIDine (CATAPRES) 0.1 MG tablet Take 0.1 mg by mouth at bedtime and may repeat dose one time if needed.   3  . Coenzyme Q10 (COQ10) 200 MG CAPS Take 200 mg by mouth as needed.     . cycloSPORINE (RESTASIS) 0.05 % ophthalmic emulsion Place 1 drop into both eyes 2 (two) times daily.     . Emollient (CERAVE EX) Apply 1 application topically See admin instructions. Apply to affected areas daily as directed or as needed for dryness    . fluticasone (FLONASE) 50 MCG/ACT nasal spray Place 1 spray into both nostrils at bedtime as needed for allergies or rhinitis.    . Garlic 8563 MG CAPS Take 1 capsule by mouth daily.    .  halobetasol (ULTRAVATE) 0.05 % ointment APPLY TO AFFECTED AREA AT BEDTIME AFTER BATHING AS DIRECTED    . hydroquinone 4 % cream Apply to face nightly    . isosorbide-hydrALAZINE (BIDIL) 20-37.5 MG tablet Take 2 tablets by mouth 2 (two) times a day. May take additional 1-2 tablets in addition, as needed for hypertension 180 tablet  2  . Magnesium 250 MG TABS Take 1 tablet by mouth daily.    . Multiple Vitamins-Minerals (MULTIVITAMIN WITH MINERALS) tablet Take 1 tablet by mouth daily. Centrum for Women    . omeprazole (PRILOSEC OTC) 20 MG tablet Take 20 mg by mouth daily as needed (for reflux).     . Pitavastatin Calcium (LIVALO) 1 MG TABS Take 1 mg by mouth daily.    . polyethylene glycol (MIRALAX / GLYCOLAX) packet Take 17 g by mouth at bedtime.     . pyridOXINE (VITAMIN B-6) 100 MG tablet Take 100 mg by mouth daily.     Marland Kitchen spironolactone (ALDACTONE) 50 MG tablet Take 50 mg by mouth daily.     Marland Kitchen triamcinolone cream (KENALOG) 0.1 % Apply 1 application topically 2 (two) times daily as needed (rash/ eczema).   2  . Wheat Dextrin (BENEFIBER) POWD Take 2 application by mouth as needed.      No current facility-administered medications on file prior to visit.     Cardiovascular studies:  Cath 04/01/2018: LM: Normal LAD: Prox-mid 40% stenosis. iFR 0.99 (Physiologically nonsignificant) LCx: Dominant. Small branches with mild disease RCA: Nondominant. Normal   LVEDP mildly elevated.   Carotid artery duplex 02/23/2018: No hemodynamically significant arterial disease in the internal carotid artery bilaterally. No significant plaque burden. Antegrade right vertebral artery flow. Antegrade left vertebral artery flow.  Echocardiogram 10/29/2017: Left ventricle cavity is normal in size. Mild concentric hypertrophy of the left ventricle. Normal global wall motion. Normal diastolic filling pattern. Calculated EF 69%. No significant valvular abnormality.  Insignificant pericardial effusion.  Exercise  myoview stress 10/20/2017: 1. The resting electrocardiogram demonstrated normal sinus rhythm, normal resting conduction and no resting arrhythmias.  The stress electrocardiogram was normal.  Patient exercised on Bruce protocol for 7:43 minutes and achieved 8.54 METS. Stress test terminated due to mid sternal chest pain and 90% % MPHR achieved (Target HR >85%). Normal BP. 2.  Myocardial perfusion imaging is normal. Overall left ventricular systolic function was normal without regional wall motion abnormalities. The left ventricular ejection fraction was 76%.  This is a low risk study.  Recent labs: Labs 06/08/2018: Glucose 97.  BUN/creatinine 19/0.87. EGFR normal.  Sodium 139, potassium 4.3.   H/H 13/41.  MCV 91.  Platelets 236  Review of Systems  Constitutional: Negative.   HENT: Negative for nosebleeds.   Eyes: Negative for blurred vision.  Respiratory: Positive for shortness of breath.   Cardiovascular: Negative for chest pain.  Gastrointestinal: Negative for abdominal pain, nausea and vomiting.  Genitourinary: Negative for dysuria.  Musculoskeletal: Negative for myalgias.  Skin: Negative for itching and rash.  Neurological: Negative for dizziness and loss of consciousness.  Endo/Heme/Allergies: Does not bruise/bleed easily.  Psychiatric/Behavioral: The patient is not nervous/anxious.   All other systems reviewed and are negative.       Vitals:   02/21/19 1905  BP: 119/70  Pulse: 65    Objective:     Physical exam: Physical Exam  Constitutional: She is oriented to person, place, and time. She appears well-developed and well-nourished. No distress.  HENT:  Head: Normocephalic and atraumatic.  Eyes: Pupils are equal, round, and reactive to light. Conjunctivae are normal.  Neck: No JVD present.  Cardiovascular: Normal rate, regular rhythm and intact distal pulses.  Pulmonary/Chest: Effort normal and breath sounds normal. She has no wheezes. She has no rales.  Abdominal:  Soft. Bowel sounds are normal. There is no rebound.  Musculoskeletal:  General: No edema.  Lymphadenopathy:    She has no cervical adenopathy.  Neurological: She is alert and oriented to person, place, and time. No cranial nerve deficit.  Skin: Skin is warm and dry.  Psychiatric: She has a normal mood and affect.  Nursing note and vitals reviewed.        Assessment & Recommendations:   78 y/o African-American female with hypertension, diet controlled diabetes, hyperlipidemia, GERD and remote history of TIA, mild nonobstructive coronary artery disease (Cath 04/01/2018).  Hypertension: Much better controlled on current medical therapy.  She wants to reduce BiDil to 1 tablet twice daily if blood pressure stays low.  This is okay with me.  Nonobstructive coronary artery disease: Continue risk factor modification with BP and lipid lowering therapy. Continue aspirin 81 mg daily.  F/u in 6 months  Biddie Sebek Esther Hardy, MD [facility] Pager: [phone] Office: [phone] If no answer Cell [phone]

## 2019-02-24 DIAGNOSIS — Z23 Encounter for immunization: Secondary | ICD-10-CM | POA: Diagnosis not present

## 2019-02-25 ENCOUNTER — Telehealth: Payer: Medicare Other | Admitting: Cardiology

## 2019-02-26 ENCOUNTER — Telehealth: Payer: Medicare Other | Admitting: Cardiology

## 2019-03-09 DIAGNOSIS — L814 Other melanin hyperpigmentation: Secondary | ICD-10-CM | POA: Diagnosis not present

## 2019-03-09 DIAGNOSIS — D229 Melanocytic nevi, unspecified: Secondary | ICD-10-CM | POA: Diagnosis not present

## 2019-03-11 ENCOUNTER — Telehealth: Payer: Self-pay

## 2019-03-11 ENCOUNTER — Other Ambulatory Visit: Payer: Self-pay

## 2019-03-11 MED ORDER — ISOSORBIDE DINITRATE 20 MG PO TABS: 20.0000 mg | ORAL_TABLET | Freq: Three times a day (TID) | ORAL | 2 refills | Status: DC

## 2019-03-11 MED ORDER — HYDRALAZINE HCL 25 MG PO TABS: 37.5000 mg | ORAL_TABLET | Freq: Three times a day (TID) | ORAL | 3 refills | Status: DC

## 2019-03-11 NOTE — Telephone Encounter (Signed)
Sent to pharmacy 

## 2019-03-11 NOTE — Telephone Encounter (Signed)
Telephone encounter:  Reason for call: pt cannot afford bidil, can this medicine be split to hydralazine 37.5 mg and Isosorbide 20 mg. Its a lot cheaper.   Usual provider: MP  Last office visit: 02/23/19 virtual  Next office visit: 08/23/19   Last hospitalization: 09/24/18  Current Outpatient Medications on File Prior to Visit  Medication Sig Dispense Refill  . albuterol (PROVENTIL HFA;VENTOLIN HFA) 108 (90 Base) MCG/ACT inhaler Inhale 1 puff into the lungs every 4 (four) hours as needed.    Marland Kitchen alendronate (FOSAMAX) 70 MG tablet Take 1 tablet by mouth once a week.    Marland Kitchen aspirin EC 81 MG tablet Take 81 mg by mouth daily.    . calcium citrate-vitamin D 500-400 MG-UNIT chewable tablet Chew 1 tablet by mouth 2 (two) times daily.     . carboxymethylcellulose (REFRESH PLUS) 0.5 % SOLN Place 1-2 drops into both eyes 3 (three) times daily.     . Cholecalciferol (VITAMIN D3) 10 MCG (400 UNIT) CAPS Take by mouth daily. 4 daily    . cloNIDine (CATAPRES) 0.1 MG tablet Take 0.1 mg by mouth at bedtime and may repeat dose one time if needed.   3  . Coenzyme Q10 (COQ10) 200 MG CAPS Take 200 mg by mouth as needed.     . cycloSPORINE (RESTASIS) 0.05 % ophthalmic emulsion Place 1 drop into both eyes 2 (two) times daily.     . Emollient (CERAVE EX) Apply 1 application topically See admin instructions. Apply to affected areas daily as directed or as needed for dryness    . fluticasone (FLONASE) 50 MCG/ACT nasal spray Place 1 spray into both nostrils at bedtime as needed for allergies or rhinitis.    . Garlic [id number] MG CAPS Take 1 capsule by mouth daily.    . isosorbide-hydrALAZINE (BIDIL) 20-37.5 MG tablet Take 2 tablets by mouth 2 (two) times a day. May take additional 1-2 tablets in addition, as needed for hypertension 180 tablet 2  . Magnesium 250 MG TABS Take 1 tablet by mouth daily.    . Multiple Vitamins-Minerals (MULTIVITAMIN WITH MINERALS) tablet Take 1 tablet by mouth daily. Centrum for Women    .  omeprazole (PRILOSEC OTC) 20 MG tablet Take 20 mg by mouth daily as needed (for reflux).     . Pitavastatin Calcium (LIVALO) 1 MG TABS Take 1 mg by mouth daily.    . polyethylene glycol (MIRALAX / GLYCOLAX) packet Take 17 g by mouth at bedtime.     . pyridOXINE (VITAMIN B-6) 100 MG tablet Take 100 mg by mouth daily.     Marland Kitchen spironolactone (ALDACTONE) 50 MG tablet Take 50 mg by mouth daily.     Marland Kitchen triamcinolone cream (KENALOG) 0.1 % Apply 1 application topically 2 (two) times daily as needed (rash/ eczema).   2  . Wheat Dextrin (BENEFIBER) POWD Take 2 application by mouth as needed.      No current facility-administered medications on file prior to visit.

## 2019-03-11 NOTE — Telephone Encounter (Signed)
[place] with me. Its not exactly the same, but okay to try. Please order   Hydralazine 25 mg 1.5 pills three times day 150 pills X 3 refills Isosorbide dinitrate 20 mg three times daily 90 pills X 3 refills.  Thanks MJP

## 2019-03-19 IMAGING — MG DIGITAL SCREENING BILATERAL MAMMOGRAM WITH TOMO AND CAD
8 series · 8 of 24 positions shown · non-contrast
Comparison: Previous exam(s).

CLINICAL DATA: Screening.

EXAM:
DIGITAL SCREENING BILATERAL MAMMOGRAM WITH TOMO AND CAD

[R CC synth-2D]
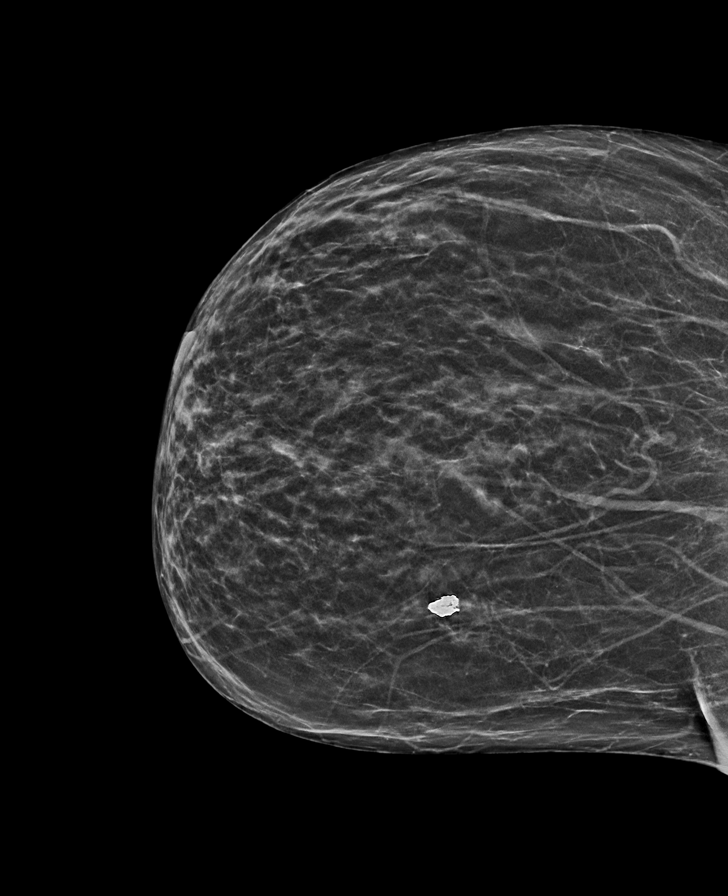

[L CC synth-2D]
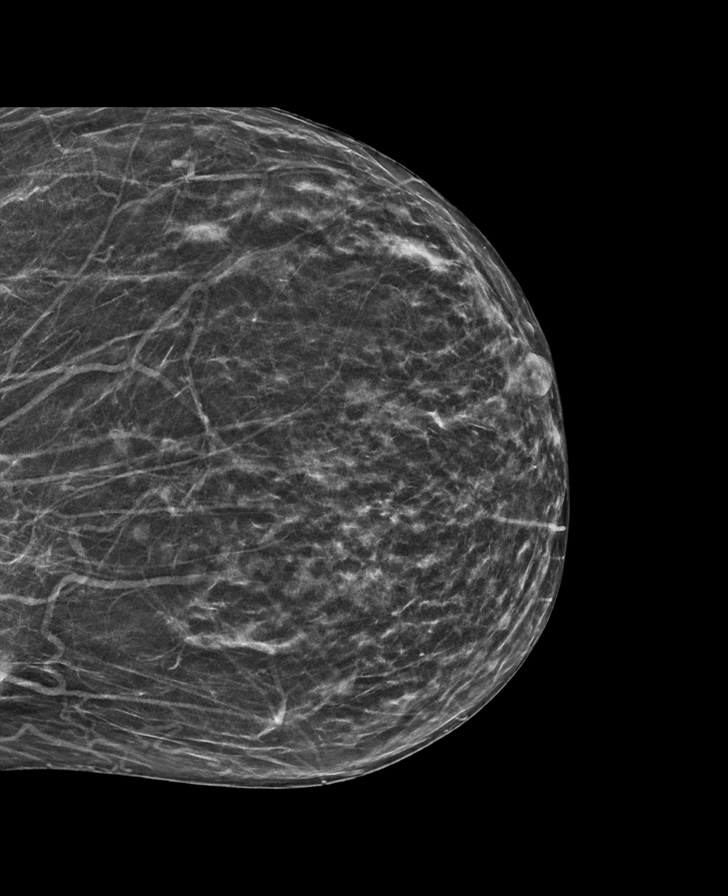

[L MLO synth-2D]
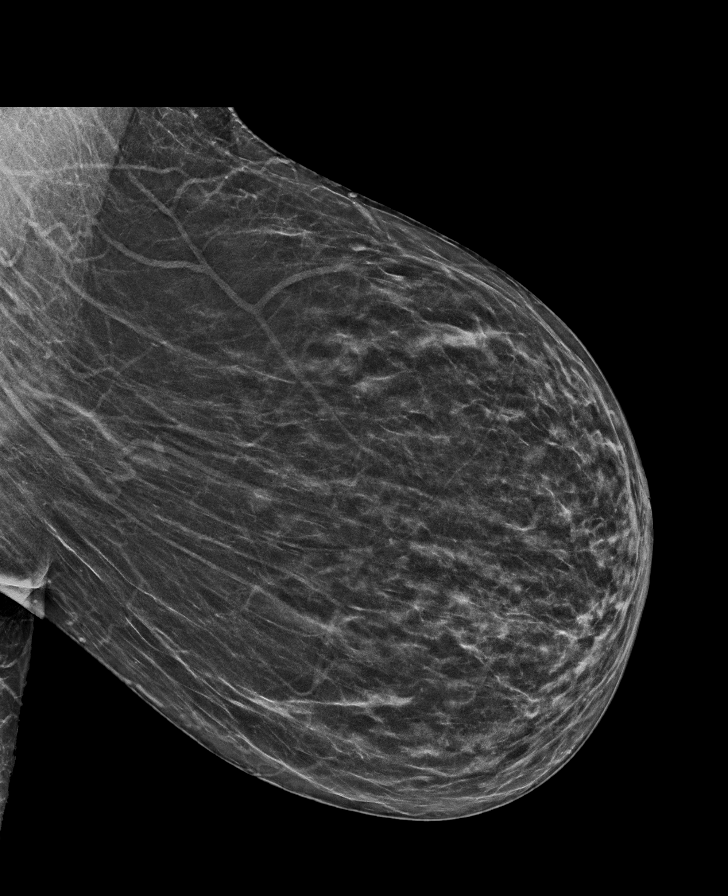

[R MLO synth-2D]
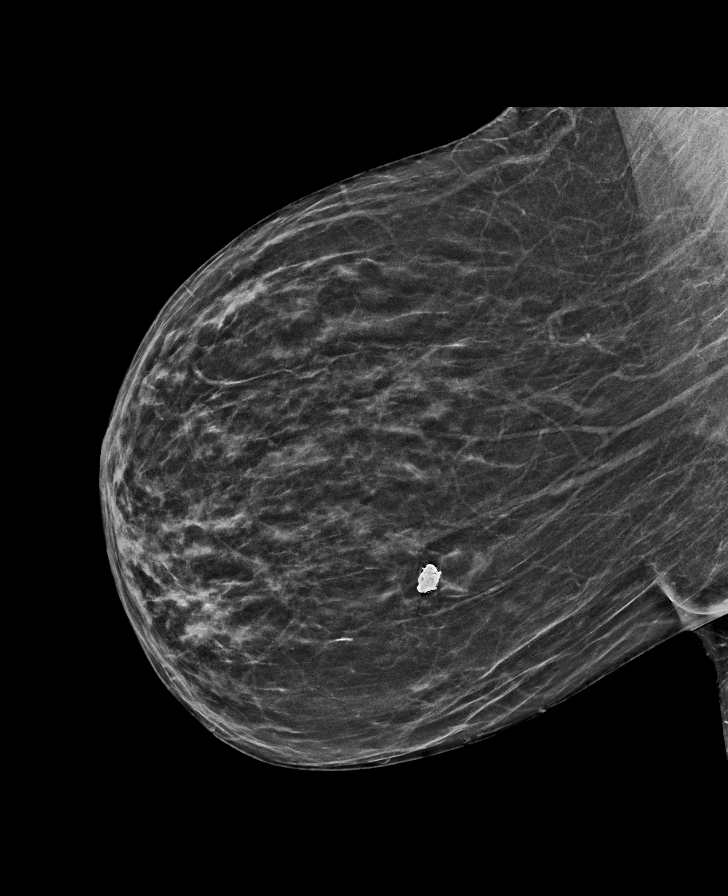

[R MLO tomo · tomo slice 31/60.0]
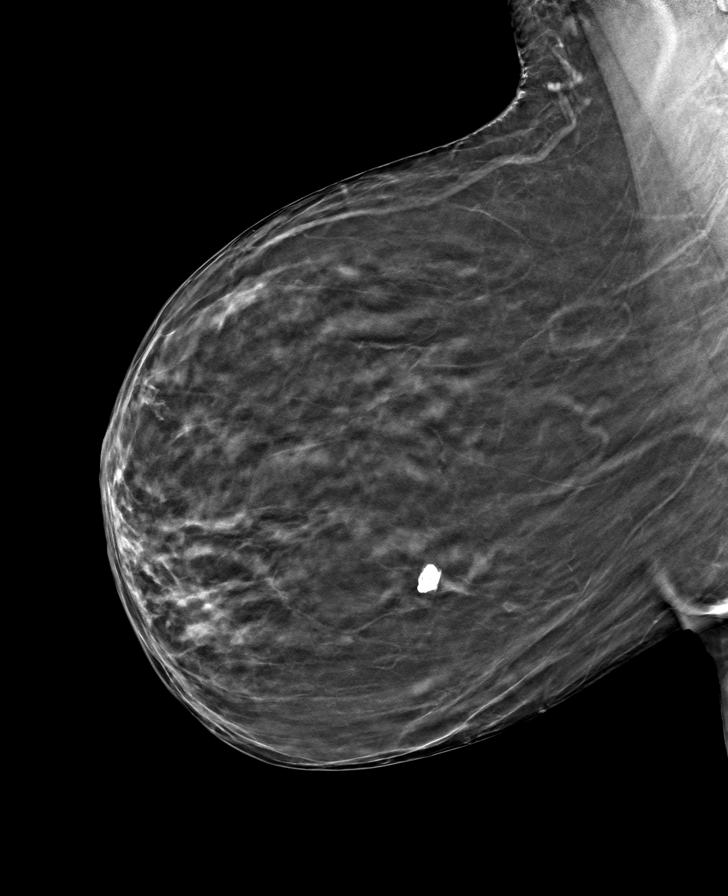

[L CC tomo · tomo slice 29/56.0]
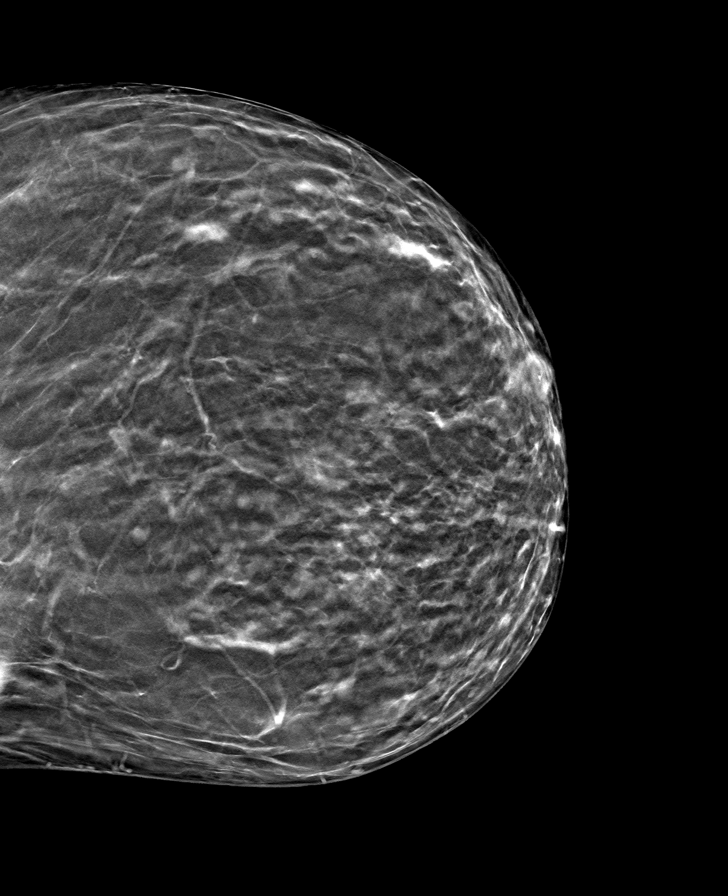

[R CC tomo · tomo slice 29/57.0]
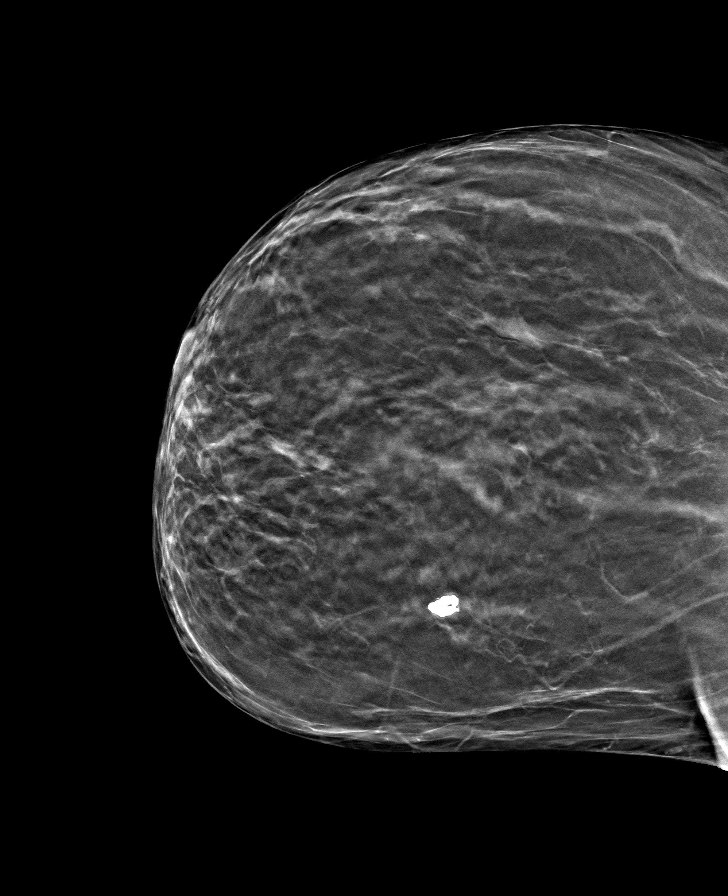

[L MLO tomo · tomo slice 31/62.0]
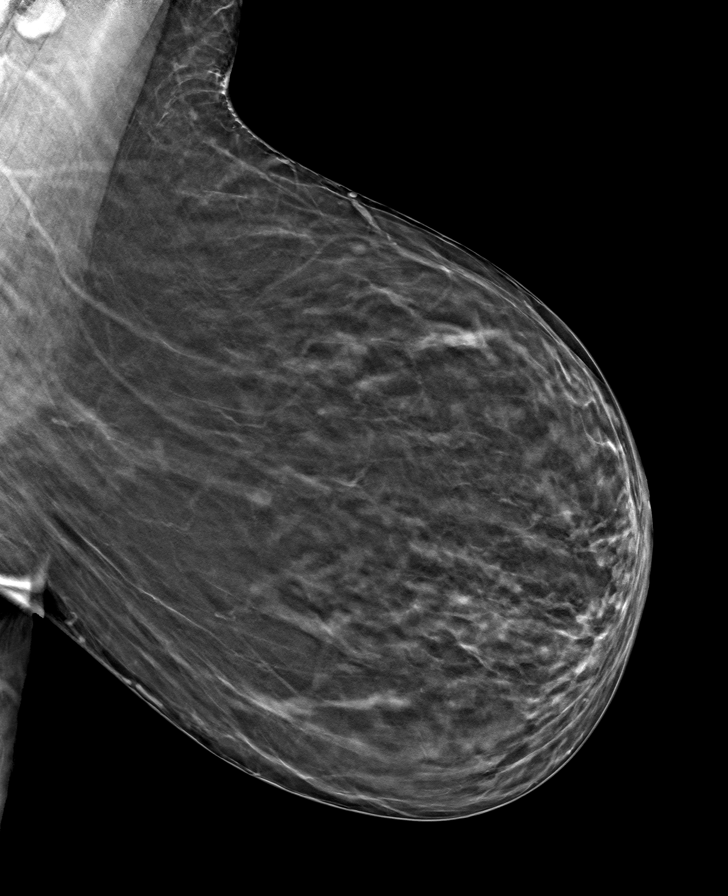

[8 of 24 positions shown; findings below may reference images not displayed]

ACR Breast Density Category c: The breast tissue is heterogeneously
dense, which may obscure small masses.
FINDINGS: There are no findings suspicious for malignancy. Images were
processed with CAD.
IMPRESSION: No mammographic evidence of malignancy. A result letter of this
screening mammogram will be mailed directly to the patient.

RECOMMENDATION:
Screening mammogram in one year. (Code:[id number]-[id number])

BI-RADS CATEGORY  1: Negative.

## 2019-03-25 DIAGNOSIS — E1169 Type 2 diabetes mellitus with other specified complication: Secondary | ICD-10-CM | POA: Diagnosis not present

## 2019-03-25 DIAGNOSIS — I10 Essential (primary) hypertension: Secondary | ICD-10-CM | POA: Diagnosis not present

## 2019-03-25 DIAGNOSIS — G459 Transient cerebral ischemic attack, unspecified: Secondary | ICD-10-CM | POA: Diagnosis not present

## 2019-03-25 DIAGNOSIS — E7800 Pure hypercholesterolemia, unspecified: Secondary | ICD-10-CM | POA: Diagnosis not present

## 2019-03-25 DIAGNOSIS — M810 Age-related osteoporosis without current pathological fracture: Secondary | ICD-10-CM | POA: Diagnosis not present

## 2019-03-25 DIAGNOSIS — I700 Atherosclerosis of aorta: Secondary | ICD-10-CM | POA: Diagnosis not present

## 2019-04-05 DIAGNOSIS — H16223 Keratoconjunctivitis sicca, not specified as Sjogren's, bilateral: Secondary | ICD-10-CM | POA: Diagnosis not present

## 2019-04-05 DIAGNOSIS — H04123 Dry eye syndrome of bilateral lacrimal glands: Secondary | ICD-10-CM | POA: Diagnosis not present

## 2019-04-05 DIAGNOSIS — H40023 Open angle with borderline findings, high risk, bilateral: Secondary | ICD-10-CM | POA: Diagnosis not present

## 2019-04-16 ENCOUNTER — Other Ambulatory Visit: Payer: Self-pay | Admitting: Cardiology

## 2019-04-16 DIAGNOSIS — I10 Essential (primary) hypertension: Secondary | ICD-10-CM

## 2019-04-30 ENCOUNTER — Encounter: Payer: Self-pay | Admitting: Podiatry

## 2019-04-30 ENCOUNTER — Ambulatory Visit (INDEPENDENT_AMBULATORY_CARE_PROVIDER_SITE_OTHER): Payer: Medicare Other | Admitting: Podiatry

## 2019-04-30 ENCOUNTER — Other Ambulatory Visit: Payer: Self-pay

## 2019-04-30 DIAGNOSIS — E1142 Type 2 diabetes mellitus with diabetic polyneuropathy: Secondary | ICD-10-CM

## 2019-04-30 DIAGNOSIS — M79675 Pain in left toe(s): Secondary | ICD-10-CM

## 2019-04-30 DIAGNOSIS — Q828 Other specified congenital malformations of skin: Secondary | ICD-10-CM | POA: Diagnosis not present

## 2019-04-30 DIAGNOSIS — B351 Tinea unguium: Secondary | ICD-10-CM

## 2019-04-30 DIAGNOSIS — M79674 Pain in right toe(s): Secondary | ICD-10-CM

## 2019-04-30 NOTE — Patient Instructions (Signed)
Diabetes Mellitus and Foot Care Foot care is an important part of your health, especially when you have diabetes. Diabetes may cause you to have problems because of poor blood flow (circulation) to your feet and legs, which can cause your skin to:  Become thinner and drier.  Break more easily.  Heal more slowly.  Peel and crack. You may also have nerve damage (neuropathy) in your legs and feet, causing decreased feeling in them. This means that you may not notice minor injuries to your feet that could lead to more serious problems. Noticing and addressing any potential problems early is the best way to prevent future foot problems. How to care for your feet Foot hygiene  Wash your feet daily with warm water and mild soap. Do not use hot water. Then, pat your feet and the areas between your toes until they are completely dry. Do not soak your feet as this can dry your skin.  Trim your toenails straight across. Do not dig under them or around the cuticle. File the edges of your nails with an emery board or nail file.  Apply a moisturizing lotion or petroleum jelly to the skin on your feet and to dry, brittle toenails. Use lotion that does not contain alcohol and is unscented. Do not apply lotion between your toes. Shoes and socks  Wear clean socks or stockings every day. Make sure they are not too tight. Do not wear knee-high stockings since they may decrease blood flow to your legs.  Wear shoes that fit properly and have enough cushioning. Always look in your shoes before you put them on to be sure there are no objects inside.  To break in new shoes, wear them for just a few hours a day. This prevents injuries on your feet. Wounds, scrapes, corns, and calluses  Check your feet daily for blisters, cuts, bruises, sores, and redness. If you cannot see the bottom of your feet, use a mirror or ask someone for help.  Do not cut corns or calluses or try to remove them with medicine.  If you  find a minor scrape, cut, or break in the skin on your feet, keep it and the skin around it clean and dry. You may clean these areas with mild soap and water. Do not clean the area with peroxide, alcohol, or iodine.  If you have a wound, scrape, corn, or callus on your foot, look at it several times a day to make sure it is healing and not infected. Check for: ? Redness, swelling, or pain. ? Fluid or blood. ? Warmth. ? Pus or a bad smell. General instructions  Do not cross your legs. This may decrease blood flow to your feet.  Do not use heating pads or hot water bottles on your feet. They may burn your skin. If you have lost feeling in your feet or legs, you may not know this is happening until it is too late.  Protect your feet from hot and cold by wearing shoes, such as at the beach or on hot pavement.  Schedule a complete foot exam at least once a year (annually) or more often if you have foot problems. If you have foot problems, report any cuts, sores, or bruises to your health care provider immediately. Contact a health care provider if:  You have a medical condition that increases your risk of infection and you have any cuts, sores, or bruises on your feet.  You have an injury that is not   healing.  You have redness on your legs or feet.  You feel burning or tingling in your legs or feet.  You have pain or cramps in your legs and feet.  Your legs or feet are numb.  Your feet always feel cold.  You have pain around a toenail. Get help right away if:  You have a wound, scrape, corn, or callus on your foot and: ? You have pain, swelling, or redness that gets worse. ? You have fluid or blood coming from the wound, scrape, corn, or callus. ? Your wound, scrape, corn, or callus feels warm to the touch. ? You have pus or a bad smell coming from the wound, scrape, corn, or callus. ? You have a fever. ? You have a red line going up your leg. Summary  Check your feet every day  for cuts, sores, red spots, swelling, and blisters.  Moisturize feet and legs daily.  Wear shoes that fit properly and have enough cushioning.  If you have foot problems, report any cuts, sores, or bruises to your health care provider immediately.  Schedule a complete foot exam at least once a year (annually) or more often if you have foot problems. This information is not intended to replace advice given to you by your health care provider. Make sure you discuss any questions you have with your health care provider. Document Released: 05/24/2000 Document Revised: 07/09/2017 Document Reviewed: 06/28/2016 Elsevier Patient Education  2020 [employer]  

## 2019-05-03 ENCOUNTER — Telehealth: Payer: Self-pay

## 2019-05-04 NOTE — Telephone Encounter (Signed)
Pt called about bp being high and she already spoke with doctor

## 2019-05-08 NOTE — Progress Notes (Signed)
Subjective: Sara Brown is seen today for follow up painful, elongated, thickened toenails bilateral feet that she cannot cut. Pain interferes with daily activities. Aggravating factor includes wearing enclosed shoe gear and relieved with periodic debridement.  She also states she saw her Dermatologist on 09.29.2020, who did not think the lesion on her left heel was suspicious/worth biopsying at this time. She will have Dermatologist's progress note faxed to our office.  Medications reviewed in chart.  Allergies  Allergen Reactions  . Naproxen Other (See Comments)    Patient preference   . Prednisone Other (See Comments)    Patient Preference   . Amlodipine Hives  . Losartan Rash    Objective: 78 y.o. AAF WD, WN in NAD. AAO x 3.   Vascular Examination: Capillary refill time immediate b/l.  Dorsalis pedis present b/l.  Posterior tibial pulses present b/l.  Digital hair absent b/l.   Skin temperature gradient WNL b/l.   Dermatological Examination: Skin with normal turgor, texture and tone b/l.  Toenails 1-5 b/l discolored, thick, dystrophic with subungual debris and pain with palpation to nailbeds due to thickness of nails.  Porokeratotic lesions plantarlateral arch left foot with tenderness to palpation. No erythema, no edema, no drainage, no flocculence.  Musculoskeletal: Muscle strength 5/5 to all LE muscle groups b/l.  Hammertoes b/l feet.  No pain, crepitus or joint limitation noted with ROM.   Neurological Examination: Protective sensation diminished with 10 gram monofilament bilaterally.  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 She saw her Dermatologist on March 09, 2019 for macule of left heel. Dermatologist did not think it needed to be biopsied. Note to be faxed to our office to be scanned into her chart. 2. Toenails 1-5 b/l were debrided in length and girth without  iatrogenic bleeding. 3. Porokeratosis left foo pared and enucleated with sterile scalpel blade without incident. 4. Patient to continue soft, supportive shoe gear daily. 5. Patient to report any pedal injuries to medical professional immediately. 6. Follow up 10 weeks.  7. Patient/POA to call should there be a concern in the interim.

## 2019-05-10 ENCOUNTER — Telehealth: Payer: Self-pay

## 2019-05-10 NOTE — Telephone Encounter (Signed)
Pt aware of instructions.//ah

## 2019-05-10 NOTE — Telephone Encounter (Signed)
Telephone encounter  Reason for call: Pt called stating that she took her isosorbide and spironolactone around 7:50 this morning and felt started feeling bad so she took her bp around 10:49 and it was 135/81 (I told her it was not bad) so she took her next dose early around 78 (normally takes at 34). Concerned that med is not working bc she is having to take doses earlier than supposed to. Also is boiling water w/ garlic and taking 1 tsp of vinegar-is this ok to do. Will sometimes take clonidine dose early bc she thinks bp is too high or she feels bad. Pt is bases judgement on how she feels. Needs parameters set for her BP as to when she needs to take additional doses if needed.   Usual provider: MP   Last office visit: 6/19  Next office visit: 3/15   Last hospitalization: 4/16   Current Outpatient Medications on File Prior to Visit  Medication Sig Dispense Refill  . albuterol (PROVENTIL HFA;VENTOLIN HFA) 108 (90 Base) MCG/ACT inhaler Inhale 1 puff into the lungs every 4 (four) hours as needed.    Marland Kitchen alendronate (FOSAMAX) 70 MG tablet Take 1 tablet by mouth once a week.    Marland Kitchen aspirin EC 81 MG tablet Take 81 mg by mouth daily.    Marland Kitchen BIDIL 20-37.5 MG tablet TAKE 2 TABS BY MOUTH 2 TIMES A DAY. MAY TAKE ADDITIONAL 1-2 TABLETS AS NEEDED FOR HYPERTENSION 180 tablet 2  . calcium citrate-vitamin D 500-400 MG-UNIT chewable tablet Chew 1 tablet by mouth 2 (two) times daily.     . carboxymethylcellulose (REFRESH PLUS) 0.5 % SOLN Place 1-2 drops into both eyes 3 (three) times daily.     . Cholecalciferol (VITAMIN D3) 10 MCG (400 UNIT) CAPS Take by mouth daily. 4 daily    . clobetasol cream (TEMOVATE) 0.05 % APPLY TO AFFECTED AREA DAILY    . cloNIDine (CATAPRES) 0.1 MG tablet Take 0.1 mg by mouth at bedtime and may repeat dose one time if needed.   3  . Coenzyme Q10 (COQ10) 200 MG CAPS Take 200 mg by mouth as needed.     . cycloSPORINE (RESTASIS) 0.05 % ophthalmic emulsion Place 1 drop into both eyes 2  (two) times daily.     . Emollient (CERAVE EX) Apply 1 application topically See admin instructions. Apply to affected areas daily as directed or as needed for dryness    . fluticasone (FLONASE) 50 MCG/ACT nasal spray Place 1 spray into both nostrils at bedtime as needed for allergies or rhinitis.    . Garlic 123XX123 MG CAPS Take 1 capsule by mouth daily.    . hydrALAZINE (APRESOLINE) 25 MG tablet Take 1.5 tablets (37.5 mg total) by mouth 3 (three) times daily. 270 tablet 3  . isosorbide dinitrate (ISORDIL) 20 MG tablet Take 1 tablet (20 mg total) by mouth 3 (three) times daily. 180 tablet 2  . Magnesium 250 MG TABS Take 1 tablet by mouth daily.    . Multiple Vitamins-Minerals (MULTIVITAMIN WITH MINERALS) tablet Take 1 tablet by mouth daily. Centrum for Women    . omeprazole (PRILOSEC OTC) 20 MG tablet Take 20 mg by mouth daily as needed (for reflux).     . Pitavastatin Calcium (LIVALO) 1 MG TABS Take 1 mg by mouth daily.    . polyethylene glycol (MIRALAX / GLYCOLAX) packet Take 17 g by mouth at bedtime.     . pyridOXINE (VITAMIN B-6) 100 MG tablet Take 100 mg by mouth  daily.     . spironolactone (ALDACTONE) 50 MG tablet Take 50 mg by mouth daily.     Marland Kitchen triamcinolone cream (KENALOG) 0.1 % Apply 1 application topically 2 (two) times daily as needed (rash/ eczema).   2  . Wheat Dextrin (BENEFIBER) POWD Take 2 application by mouth as needed.      No current facility-administered medications on file prior to visit.

## 2019-05-10 NOTE — Telephone Encounter (Signed)
Bidil 2 tab 20-37.5 mg in am Spironolactone 50 mg around noon Bidil 2 tab 20-37.5 mg afternoon Clonidine 0.1 mg at bedtime Additional 1-2 tab of Bidil during th daytime for episodes of elevated BP SBP >160 mmHg.  Thanks MJP

## 2019-05-21 ENCOUNTER — Telehealth: Payer: Self-pay

## 2019-05-21 NOTE — Telephone Encounter (Signed)
Pt called and stated that she took Spironolactone 50mg  1 1/2 tabs this morning by accident she meant to take her hydralazine and isosorbide; Per AK she is fine her bp might run a bit low but she will be ok; Pt will monitor BP and let us know if anything changes

## 2019-07-05 ENCOUNTER — Telehealth: Payer: Self-pay

## 2019-07-05 NOTE — Telephone Encounter (Signed)
Unfortunately I do not have any appt available today. Can add to afternoon appt tomorrow.  Thanks MJP

## 2019-07-05 NOTE — Telephone Encounter (Signed)
Pt called and mention that for the past 3 days she has had a big headache. Pt mention she has been taking her bp medication. Pt mention her bp has been around 158/81. Pt would like to make an appt today. Please advise. Thank you

## 2019-07-06 ENCOUNTER — Encounter: Payer: Self-pay | Admitting: Cardiology

## 2019-07-06 ENCOUNTER — Ambulatory Visit (INDEPENDENT_AMBULATORY_CARE_PROVIDER_SITE_OTHER): Payer: Medicare Other | Admitting: Cardiology

## 2019-07-06 ENCOUNTER — Other Ambulatory Visit: Payer: Self-pay

## 2019-07-06 VITALS — BP 146/90 | HR 65 | Temp 98.3°F | Ht 66.0 in | Wt 139.0 lb

## 2019-07-06 DIAGNOSIS — I10 Essential (primary) hypertension: Secondary | ICD-10-CM | POA: Diagnosis not present

## 2019-07-06 DIAGNOSIS — I2510 Atherosclerotic heart disease of native coronary artery without angina pectoris: Secondary | ICD-10-CM | POA: Diagnosis not present

## 2019-07-06 NOTE — Telephone Encounter (Signed)
Called pt to inform her that she can come in today in the afrernoon. Pt mention she is feeling better but she would still make that appt for this afternoon.

## 2019-07-06 NOTE — Progress Notes (Signed)
 Follow up visit  Subjective:   Sara Brown, female    DOB: 05/30/1941, 78 y.o.   MRN: [id number]   HPI  Chief Complaint  Patient presents with  . Hypertension  . Coronary Artery Disease    78 y/o African-American female with hypertension, diet controlled diabetes, hyperlipidemia, GERD and remote history of TIA, mild nonobstructive coronary artery disease (Cath 04/01/2018).  Patient has had BP spikes up to 150s/90s. She gets very symptomatic with both low and high BP, with her symptoms primarily being headache. Since seeing her BP today, she is getting nervous.    Current Outpatient Medications on File Prior to Visit  Medication Sig Dispense Refill  . albuterol (PROVENTIL HFA;VENTOLIN HFA) 108 (90 Base) MCG/ACT inhaler Inhale 1 puff into the lungs every 4 (four) hours as needed.    . alendronate (FOSAMAX) 70 MG tablet Take 1 tablet by mouth once a week.    . aspirin EC 81 MG tablet Take 81 mg by mouth daily.    . calcium citrate-vitamin D 500-400 MG-UNIT chewable tablet Chew 1 tablet by mouth 2 (two) times daily.     . carboxymethylcellulose (REFRESH PLUS) 0.5 % SOLN Place 1-2 drops into both eyes 3 (three) times daily.     . Cholecalciferol (VITAMIN D3) 10 MCG (400 UNIT) CAPS Take by mouth daily. 4 daily    . clobetasol cream (TEMOVATE) 0.05 % APPLY TO AFFECTED AREA DAILY    . cloNIDine (CATAPRES) 0.1 MG tablet Take 0.1 mg by mouth at bedtime and may repeat dose one time if needed.   3  . Coenzyme Q10 (COQ10) 200 MG CAPS Take 200 mg by mouth as needed.     . cycloSPORINE (RESTASIS) 0.05 % ophthalmic emulsion Place 1 drop into both eyes 2 (two) times daily.     . fluticasone (FLONASE) 50 MCG/ACT nasal spray Place 1 spray into both nostrils at bedtime as needed for allergies or rhinitis.    . Garlic 1000 MG CAPS Take 1 capsule by mouth daily.    . hydrALAZINE (APRESOLINE) 25 MG tablet Take 1.5 tablets (37.5 mg total) by mouth 3 (three) times daily. 270 tablet 3  .  isosorbide dinitrate (ISORDIL) 20 MG tablet Take 1 tablet (20 mg total) by mouth 3 (three) times daily. 180 tablet 2  . Magnesium 250 MG TABS Take 1 tablet by mouth daily.    . Multiple Vitamins-Minerals (MULTIVITAMIN WITH MINERALS) tablet Take 1 tablet by mouth daily. Centrum for Women    . omeprazole (PRILOSEC OTC) 20 MG tablet Take 20 mg by mouth daily as needed (for reflux).     . Pitavastatin Calcium (LIVALO) 1 MG TABS Take 1 mg by mouth daily.    . polyethylene glycol (MIRALAX / GLYCOLAX) packet Take 17 g by mouth at bedtime.     . pyridOXINE (VITAMIN B-6) 100 MG tablet Take 100 mg by mouth daily.     . spironolactone (ALDACTONE) 50 MG tablet Take 50 mg by mouth daily.     . triamcinolone cream (KENALOG) 0.1 % Apply 1 application topically 2 (two) times daily as needed (rash/ eczema).   2  . Emollient (CERAVE EX) Apply 1 application topically See admin instructions. Apply to affected areas daily as directed or as needed for dryness     No current facility-administered medications on file prior to visit.    Cardiovascular & other pertient studies:  EKG 07/06/2019: Sinus rhythm 63 bpm. Left axis deviation. Otherwise normal EKG.  Cath 04/01/2018:   LM: Normal LAD: Prox-mid 40% stenosis. iFR 0.99 (Physiologically nonsignificant) LCx: Dominant. Small branches with mild disease RCA: Nondominant. Normal   LVEDP mildly elevated.   Carotid artery duplex 02/23/2018: No hemodynamically significant arterial disease in the internal carotid artery bilaterally. No significant plaque burden. Antegrade right vertebral artery flow. Antegrade left vertebral artery flow.  Echocardiogram 10/29/2017: Left ventricle cavity is normal in size. Mild concentric hypertrophy of the left ventricle. Normal global wall motion. Normal diastolic filling pattern. Calculated EF 69%. No significant valvular abnormality.  Insignificant pericardial effusion.  Exercise myoview stress 10/20/2017: 1. The resting  electrocardiogram demonstrated normal sinus rhythm, normal resting conduction and no resting arrhythmias.  The stress electrocardiogram was normal.  Patient exercised on Bruce protocol for 7:43 minutes and achieved 8.54 METS. Stress test terminated due to mid sternal chest pain and 90% % MPHR achieved (Target HR >85%). Normal BP. 2.  Myocardial perfusion imaging is normal. Overall left ventricular systolic function was normal without regional wall motion abnormalities. The left ventricular ejection fraction was 76%.  This is a low risk study.  Recent labs: 09/24/2018: Glucose 137, BUN/Cr 18/0.89. EGFR >60. Na/K 137/3.9.  H/H 12/37. MCV 88. Platelets 217 HbA1C 6.2%  02/2018: Chol 186, TG 71, HDL 58, LDL 114    Review of Systems  Cardiovascular: Negative for chest pain, dyspnea on exertion, leg swelling, palpitations and syncope.  Neurological: Positive for headaches.  Psychiatric/Behavioral: The patient is nervous/anxious.          Vitals:   07/06/19 1517  BP: (!) 146/90  Pulse: 65  Temp: 98.3 F (36.8 C)  SpO2: 92%     Body mass index is 22.44 kg/m. Filed Weights   07/06/19 1517  Weight: 139 lb (63 kg)     Objective:   Physical Exam  Constitutional: She appears well-developed and well-nourished.  Neck: No JVD present.  Cardiovascular: Normal rate, regular rhythm, normal heart sounds and intact distal pulses.  No murmur heard. Pulmonary/Chest: Effort normal and breath sounds normal. She has no wheezes. She has no rales.  Musculoskeletal:        General: No edema.  Nursing note and vitals reviewed.         Assessment & Recommendations:   79 y/o African-American female with hypertension, diet controlled diabetes, hyperlipidemia, GERD and remote history of TIA, mild nonobstructive coronary artery disease (Cath 04/01/2018).  Hypertension: Symptomatic with mild blood pressure variations. Asked her to take 2 tabs of 25 mg hydralazine tid. Continue the rest. Fu  in 2 weeks. Reassured her that her EKG is normal.  Nonobstructive coronary artery disease: Continue risk factor modification with BP and lipid lowering therapy. Continue aspirin 81 mg daily, pitvastatin. Has not tolerated other statins in the past.  Recheck lipid panel at next visit.  F/u in 2 weeks   Nigel Mormon, MD [facility] Pager: [phone] Office: [phone]

## 2019-07-06 NOTE — Telephone Encounter (Signed)
3 PM

## 2019-07-07 ENCOUNTER — Ambulatory Visit: Payer: Medicare Other | Admitting: Cardiology

## 2019-07-22 ENCOUNTER — Ambulatory Visit (INDEPENDENT_AMBULATORY_CARE_PROVIDER_SITE_OTHER): Payer: Medicare Other | Admitting: Cardiology

## 2019-07-22 ENCOUNTER — Encounter: Payer: Self-pay | Admitting: Cardiology

## 2019-07-22 ENCOUNTER — Other Ambulatory Visit: Payer: Self-pay

## 2019-07-22 VITALS — BP 130/82 | HR 77 | Temp 96.2°F | Ht 66.0 in | Wt 142.0 lb

## 2019-07-22 DIAGNOSIS — I10 Essential (primary) hypertension: Secondary | ICD-10-CM

## 2019-07-22 DIAGNOSIS — I2510 Atherosclerotic heart disease of native coronary artery without angina pectoris: Secondary | ICD-10-CM | POA: Diagnosis not present

## 2019-07-22 MED ORDER — HYDRALAZINE HCL 50 MG PO TABS: 50.0000 mg | ORAL_TABLET | Freq: Three times a day (TID) | ORAL | 3 refills | Status: DC

## 2019-07-22 NOTE — Progress Notes (Signed)
Follow up visit  Subjective:   Sara Brown, female    DOB: May 09, 1941, 79 y.o.   MRN: [id number]   Hypertension Associated symptoms include headaches. Pertinent negatives include no chest pain or palpitations.    Chief Complaint  Patient presents with  . Hypertension  . Follow-up    2 week     79 y/o African-American female with hypertension, diet controlled diabetes, hyperlipidemia, GERD and remote history of TIA, mild nonobstructive coronary artery disease (Cath 04/01/2018).  Blood pressure much better controlled. She has almost daily headaches, unrelated to her BP reading.   Current Outpatient Medications on File Prior to Visit  Medication Sig Dispense Refill  . albuterol (PROVENTIL HFA;VENTOLIN HFA) 108 (90 Base) MCG/ACT inhaler Inhale 1 puff into the lungs every 4 (four) hours as needed.    Marland Kitchen alendronate (FOSAMAX) 70 MG tablet Take 1 tablet by mouth once a week.    Marland Kitchen aspirin EC 81 MG tablet Take 81 mg by mouth daily.    . calcium citrate-vitamin D 500-400 MG-UNIT chewable tablet Chew 1 tablet by mouth 2 (two) times daily.     . carboxymethylcellulose (REFRESH PLUS) 0.5 % SOLN Place 1-2 drops into both eyes 3 (three) times daily.     . Cholecalciferol (VITAMIN D3) 10 MCG (400 UNIT) CAPS Take by mouth daily. 4 daily    . clobetasol cream (TEMOVATE) 0.05 % APPLY TO AFFECTED AREA DAILY    . cloNIDine (CATAPRES) 0.1 MG tablet Take 0.1 mg by mouth at bedtime and may repeat dose one time if needed.   3  . Coenzyme Q10 (COQ10) 200 MG CAPS Take 200 mg by mouth as needed.     . cycloSPORINE (RESTASIS) 0.05 % ophthalmic emulsion Place 1 drop into both eyes 2 (two) times daily.     . Emollient (CERAVE EX) Apply 1 application topically See admin instructions. Apply to affected areas daily as directed or as needed for dryness    . fluticasone (FLONASE) 50 MCG/ACT nasal spray Place 1 spray into both nostrils at bedtime as needed for allergies or rhinitis.    . Garlic 0388 MG  CAPS Take 1 capsule by mouth daily.    . hydrALAZINE (APRESOLINE) 25 MG tablet Take 1.5 tablets (37.5 mg total) by mouth 3 (three) times daily. 270 tablet 3  . isosorbide dinitrate (ISORDIL) 20 MG tablet Take 1 tablet (20 mg total) by mouth 3 (three) times daily. 180 tablet 2  . Magnesium 250 MG TABS Take 1 tablet by mouth daily.    . Multiple Vitamins-Minerals (MULTIVITAMIN WITH MINERALS) tablet Take 1 tablet by mouth daily. Centrum for Women    . omeprazole (PRILOSEC OTC) 20 MG tablet Take 20 mg by mouth daily as needed (for reflux).     . Pitavastatin Calcium (LIVALO) 1 MG TABS Take 1 mg by mouth daily.    . polyethylene glycol (MIRALAX / GLYCOLAX) packet Take 17 g by mouth at bedtime.     . pyridOXINE (VITAMIN B-6) 100 MG tablet Take 100 mg by mouth daily.     Marland Kitchen spironolactone (ALDACTONE) 50 MG tablet Take 50 mg by mouth daily.     Marland Kitchen triamcinolone cream (KENALOG) 0.1 % Apply 1 application topically 2 (two) times daily as needed (rash/ eczema).   2   No current facility-administered medications on file prior to visit.    Cardiovascular & other pertient studies:  EKG 07/06/2019: Sinus rhythm 63 bpm. Left axis deviation. Otherwise normal EKG.  Cath 04/01/2018: LM:  Normal LAD: Prox-mid 40% stenosis. iFR 0.99 (Physiologically nonsignificant) LCx: Dominant. Small branches with mild disease RCA: Nondominant. Normal   LVEDP mildly elevated.   Carotid artery duplex 02/23/2018: No hemodynamically significant arterial disease in the internal carotid artery bilaterally. No significant plaque burden. Antegrade right vertebral artery flow. Antegrade left vertebral artery flow.  Echocardiogram 10/29/2017: Left ventricle cavity is normal in size. Mild concentric hypertrophy of the left ventricle. Normal global wall motion. Normal diastolic filling pattern. Calculated EF 69%. No significant valvular abnormality.  Insignificant pericardial effusion.  Exercise myoview stress 10/20/2017: 1.  The resting electrocardiogram demonstrated normal sinus rhythm, normal resting conduction and no resting arrhythmias.  The stress electrocardiogram was normal.  Patient exercised on Bruce protocol for 7:43 minutes and achieved 8.54 METS. Stress test terminated due to mid sternal chest pain and 90% % MPHR achieved (Target HR >85%). Normal BP. 2.  Myocardial perfusion imaging is normal. Overall left ventricular systolic function was normal without regional wall motion abnormalities. The left ventricular ejection fraction was 76%.  This is a low risk study.  Recent labs: 09/24/2018: Glucose 137, BUN/Cr 18/0.89. EGFR >60. Na/K 137/3.9.  H/H 12/37. MCV 88. Platelets 217 HbA1C 6.2%  02/2018: Chol 186, TG 71, HDL 58, LDL 114    Review of Systems  Cardiovascular: Negative for chest pain, dyspnea on exertion, leg swelling, palpitations and syncope.  Neurological: Positive for headaches.  Psychiatric/Behavioral: The patient is nervous/anxious.          Vitals:   07/22/19 1333  BP: 130/82  Pulse: 77  Temp: (!) 96.2 F (35.7 C)  SpO2: 97%     Body mass index is 22.92 kg/m. Filed Weights   07/22/19 1333  Weight: 142 lb (64.4 kg)     Objective:   Physical Exam  Constitutional: She appears well-developed and well-nourished.  Neck: No JVD present.  Cardiovascular: Normal rate, regular rhythm, normal heart sounds and intact distal pulses.  No murmur heard. Pulmonary/Chest: Effort normal and breath sounds normal. She has no wheezes. She has no rales.  Musculoskeletal:        General: No edema.  Nursing note and vitals reviewed.         Assessment & Recommendations:   79 y/o African-American female with hypertension, diet controlled diabetes, hyperlipidemia, GERD and remote history of TIA, mild nonobstructive coronary artery disease (Cath 04/01/2018).  Hypertension: Controlled. No change made today.  Continue hydralazine 50 mg tid, Isordil 20 mg tid, spironolactone 50 mg  daily, clonidine 0.1 mg bid  Nonobstructive coronary artery disease: Continue risk factor modification with BP and lipid lowering therapy. Continue aspirin 81 mg daily, pitvastatin. Has not tolerated other statins in the past.  Recheck lipid panel at next visit.  Defer management of headache to PCP.  F/u in 6 months   Abdurrahman Petersheim Esther Hardy, MD [facility] Pager: [phone] Office: [phone]

## 2019-07-23 ENCOUNTER — Telehealth: Payer: Self-pay

## 2019-07-23 NOTE — Telephone Encounter (Signed)
Take an extra dose of hydralazine if systolic BP [id number] mmHg  Thanks MJP

## 2019-07-23 NOTE — Telephone Encounter (Signed)
Gave Patient Dr. Bonney Roussel recommendations for blood pressure

## 2019-07-23 NOTE — Telephone Encounter (Signed)
Patient blood pressure is running high 164/90. What should she do. She is taking medications as directed. Still having pain in her head. Thinks this is related to blood pressure, it is happening when pressure is high. What can she do?

## 2019-07-28 ENCOUNTER — Other Ambulatory Visit: Payer: Self-pay

## 2019-07-28 ENCOUNTER — Ambulatory Visit (INDEPENDENT_AMBULATORY_CARE_PROVIDER_SITE_OTHER): Payer: Medicare Other | Admitting: Podiatry

## 2019-07-28 ENCOUNTER — Encounter: Payer: Self-pay | Admitting: Podiatry

## 2019-07-28 DIAGNOSIS — M79674 Pain in right toe(s): Secondary | ICD-10-CM | POA: Diagnosis not present

## 2019-07-28 DIAGNOSIS — E1142 Type 2 diabetes mellitus with diabetic polyneuropathy: Secondary | ICD-10-CM | POA: Diagnosis not present

## 2019-07-28 DIAGNOSIS — M79675 Pain in left toe(s): Secondary | ICD-10-CM

## 2019-07-28 DIAGNOSIS — B351 Tinea unguium: Secondary | ICD-10-CM | POA: Diagnosis not present

## 2019-07-28 DIAGNOSIS — Q828 Other specified congenital malformations of skin: Secondary | ICD-10-CM | POA: Diagnosis not present

## 2019-07-28 NOTE — Patient Instructions (Signed)

## 2019-07-29 NOTE — Progress Notes (Signed)
Subjective: Sara Brown presents today for follow up of preventative diabetic foot care and painful porokeratotic lesion(s) plantar left foot and painful mycotic toenails b/l that limit ambulation. Aggravating factors include weightbearing with and without shoe gear. Pain for both is relieved with periodic professional debridement..   Allergies  Allergen Reactions  . Naproxen Other (See Comments)    Patient preference   . Prednisone Other (See Comments)    Patient Preference   . Amlodipine Hives  . Losartan Rash     Objective: There were no vitals filed for this visit.  Vascular Examination:  Capillary refill time to digits immediate b/l, palpable DP pulses b/l, palpable PT pulses b/l, pedal hair absent b/l and skin temperature gradient within normal limits b/l  Dermatological Examination: Pedal skin with normal turgor, texture and tone bilaterally, no open wounds bilaterally, no interdigital macerations bilaterally, toenails 1-5 b/l elongated, dystrophic, thickened, crumbly with subungual debris and porokeratotic lesion(s) plantarlateral arch left foot. No erythema, no edema, no drainage, no flocculence  Musculoskeletal: Normal muscle strength 5/5 to all lower extremity muscle groups bilaterally, no pain crepitus or joint limitation noted with ROM b/l and hammertoes noted to the  2-5 bilaterally  Neurological: Protective sensation absent with 10g monofilament b/l  Assessment: 1. Pain due to onychomycosis of toenails of both feet   2. Porokeratosis   3. Diabetic peripheral neuropathy associated with type 2 diabetes mellitus ([place])    Plan: -Continue diabetic foot care principles. Literature dispensed on today.  -Toenails 1-5 b/l were debrided in length and girth without iatrogenic bleeding. -Painful porokeratotic lesions left foot pared and enucleated with sterile scalpel blade -Patient to continue soft, supportive shoe gear daily. -Patient to report any pedal injuries  to medical professional immediately. -Patient/POA to call should there be question/concern in the interim.  Return in about 10 weeks (around 10/06/2019).

## 2019-08-02 ENCOUNTER — Ambulatory Visit: Payer: Medicare Other | Admitting: Orthotics

## 2019-08-02 ENCOUNTER — Other Ambulatory Visit: Payer: Self-pay

## 2019-08-02 DIAGNOSIS — R519 Headache, unspecified: Secondary | ICD-10-CM | POA: Diagnosis not present

## 2019-08-02 DIAGNOSIS — B351 Tinea unguium: Secondary | ICD-10-CM

## 2019-08-02 DIAGNOSIS — E1142 Type 2 diabetes mellitus with diabetic polyneuropathy: Secondary | ICD-10-CM

## 2019-08-02 DIAGNOSIS — Q828 Other specified congenital malformations of skin: Secondary | ICD-10-CM

## 2019-08-02 NOTE — Progress Notes (Signed)

## 2019-08-19 ENCOUNTER — Encounter: Payer: Self-pay | Admitting: Neurology

## 2019-08-19 ENCOUNTER — Other Ambulatory Visit: Payer: Self-pay

## 2019-08-19 ENCOUNTER — Ambulatory Visit (INDEPENDENT_AMBULATORY_CARE_PROVIDER_SITE_OTHER): Payer: Medicare Other | Admitting: Neurology

## 2019-08-19 VITALS — BP 125/62 | HR 92 | Temp 97.4°F | Ht 66.0 in | Wt 145.5 lb

## 2019-08-19 DIAGNOSIS — I2510 Atherosclerotic heart disease of native coronary artery without angina pectoris: Secondary | ICD-10-CM

## 2019-08-19 DIAGNOSIS — R519 Headache, unspecified: Secondary | ICD-10-CM

## 2019-08-19 DIAGNOSIS — G4489 Other headache syndrome: Secondary | ICD-10-CM

## 2019-08-19 NOTE — Progress Notes (Signed)
Subjective:    Patient ID: Sara Brown is a 79 y.o. female.  HPI     Interim history:  Dear Dr. Delfina Redwood,  I saw your patient, Sara Brown, upon your kind request in my neurologic clinic today for initial consultation of her recurrent headaches.  The patient is unaccompanied today.  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  She has fairly short-lived headaches in the top of the head and sometimes in the back of the head or parietal areas.  No temporal tenderness, no jaw pain, no temporal headaches.  She wonders if these could be sinus related, she has a history of postnasal drip and chronic sinus issues.  She takes Flonase every day.  She feels that the headache may also be related to blood pressure elevations.  She has no history of migraine headaches and headaches are not associated with nausea or vomiting or light or sound sensitivity.  She has a eye examination pending for this month.  I reviewed your office note from 08/02/2019.  I saw her in January of last year for a longer standing history of facial paresthesias.  She had a brain MRI without contrast on 06/18/2018 and I reviewed the results: IMPRESSION: This MRI of the brain without contrast shows the following: 1.    Mild chronic microvascular ischemic changes, essentially unchanged compared to the 07/28/2014 MRI. 2.    There are no acute findings.    Previously:  Of note, she missed an appointment on 04/20/2018. The patient is a 79 year old right-handed woman with an underlying medical history of hypertension, hyperlipidemia, reflux disease, allergic rhinitis, osteoporosis, osteoarthritis, asthma, vitamin D deficiency, carotid  stenosis on the left, and chronic constipation, who presents for evaluation of  facial paresthesias. She was seen in the emergency room on 02/09/2018 and reported facial paresthesias and discomfort. She had a head CT without contrast on 02/09/2018 and I reviewed the results: IMPRESSION: No acute abnormality.   Chronic microvascular ischemic change.   Of note, she had interim emergency room visits and hospitalization for chest pain.   She has had elevated BP values, she sees cardiology for this. She went to the ER in [place] on 01/07/18 for HTN, she had a CTH wo contrast at the time, which was neg.  She reports intermittent tingling sensation affecting the left arm and sometimes the left side of her face, sometimes affecting her tongue.    She reports intermittent pain in the left arm, of note, when she was seen in the emergency room she had right-sided symptoms.     10/17/2014: She reports no new symptoms. She feels that sometimes she has a drawing sensation at the left side of her mouth. She has no one-sided numbness or tingling or weakness. She has improved symptoms on the right side. She had recent blood work with her primary care physician last month. We will request test results from his PCP.    I first met her on 07/15/2014 at the request of her primary care physician, at which time she reported a several year history of intermittent abnormal sensations in her right face, also affecting her right arm with pulling sensation in the right arm and shoulders as well as tingling noted. I suggested further evaluation with an EEG and MRI  brain. We called her with all her test results and also she was seen in the interim by her nurse practitioner, Ms. Clabe Seal on 08/30/2014 at which time we discussed all her test results as well and I also saw her at the time. She felt she may have a facial droop on the left. On examination, she did not have any obvious facial droop on either side. Her exam was nonfocal.     She had an EEG on 07/26/2014 which was reported as normal in the awake state. 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 No enhancing lesions are noted. No significant change compared with CT head dated 05/27/2014. In addition, I personally reviewed the images through the PACS system and agree with the findings.   07/15/2014: (She) has a several year history of intermittent abnormal sensations in her right face and R arm, with pulling sensation in the right arm and shoulder with tingling noted. She was told she may have a right facial droop when she saw physical therapy.  She is currently in outpatient physical therapy. She has had intermittent problems with abnormal sensation in her right face and arm for years. She may go days without symptoms. She wonders if this is a stress-related reaction. She was rushing to get to this appointment and while driving she had a sensation of tingling that started in her right mid face and went to her right shoulder. She continues to have very mild symptoms currently. Very occasionally will she have symptoms on the left side. She has no left or right lower body symptoms. Strength-wise sometimes she feels a little weak during the episode which can last minutes to hours. It is usually not just seconds. She does not lose consciousness or awareness. She has never noted any abnormal involuntary twitching. She has not noted any muscle wasting. Symptoms started years ago. She had a recent head CT and remote MRI brain. She never had an EEG. There is no overt triggers or alleviating factors. She does not have any associated headaches with this but certainly does report occasional left parietal headaches which are not debilitating.   She had a CT head without contrast on 05/27/2014: 1. No acute intracranial findings. 2. Mild microvascular  disease. In addition, personally reviewed the images through the PACS system.   She had 2D echo on 10/18/13: this was unremarkable with EF 60-65%.    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 No other significant ischemic changes are noted. The enhancement pattern is normal and there is no mass lesion. No intracranial hemorrhage is identified.   In addition, personally reviewed the images through the PACS system. She had a MRA head without contrast and MRA neck with contrast on 03/09/2004 because of dizziness reported: MR angiography of the intracranial circulation shows no significant proximal stenosis or intracranial atherosclerotic change.   Advanced dolichoectasia of the craniocervical vessels without significant proximal stenosis. She had a BMP on 06/15/2014: This was unremarkable.  Her Past Medical History Is Significant For: Past Medical History:  Diagnosis Date  . Allergic rhinitis   . Arthritis    "fingers, knees" (02/09/2018)  . Asthma    "stopped taking RX after dr said I don't have this" (02/09/2018)  . Carotid stenosis  ICA(L)  . Complication of anesthesia    "felt the cut w/one of my c-sections" (02/09/2018)  . Constipation   . GERD (gastroesophageal reflux disease)   . Headache    "years ago; before menopause" (02/09/2018)  . Hyperlipidemia   . Hypertension   . Neuropathy    face (R)  . OA (osteoarthritis)   . Osteoporosis   . TIA (transient ischemic attack) ?2005  . Type II diabetes mellitus ([place])   . Vitamin D deficiency     Her Past Surgical History Is Significant For: Past Surgical History:  Procedure Laterality Date  . CATARACT EXTRACTION W/ INTRAOCULAR LENS  IMPLANT, BILATERAL Bilateral   . CESAREAN SECTION  1971; 1975  . INTRAVASCULAR PRESSURE WIRE/FFR STUDY N/A 04/01/2018   Procedure: INTRAVASCULAR PRESSURE WIRE/FFR STUDY;  Surgeon:  Nigel Mormon, MD;  Location: [facility];  Service: Cardiovascular;  Laterality: N/A;  . LEFT HEART CATH AND CORONARY ANGIOGRAPHY N/A 04/01/2018   Procedure: LEFT HEART CATH AND CORONARY ANGIOGRAPHY;  Surgeon: Nigel Mormon, MD;  Location: [facility];  Service: Cardiovascular;  Laterality: N/A;  . TUBAL LIGATION  1975    Her Family History Is Significant For: Family History  Problem Relation Age of Onset  . Diabetes Mellitus II Mother   . Osteoporosis Mother   . Hypertension Mother   . Diabetes Mellitus II Father   . CVA Sister        4  . CAD Sister        4  . Hypertension Sister        4  . Osteoarthritis Sister        4  . Alcoholism Brother   . Breast cancer Cousin 75    Her Social History Is Significant For: Social History   Socioeconomic History  . Marital status: Married    Spouse name: Bland Span  . Number of children: 2  . Years of education: 16  . Highest education level: Bachelor's degree (e.g., BA, AB, BS)  Occupational History    Comment: employed at home  Tobacco Use  . Smoking status: Never Smoker  . Smokeless tobacco: Never Used  Substance and Sexual Activity  . Alcohol use: Never  . Drug use: Never  . Sexual activity: Not Currently  Other Topics Concern  . Not on file  Social History Narrative   Consumes no caffeine   Social Determinants of Health   Financial Resource Strain:   . Difficulty of Paying Living Expenses:   Food Insecurity:   . Worried About Charity fundraiser in the Last Year:   . Arboriculturist in the Last Year:   Transportation Needs:   . Film/video editor (Medical):   Marland Kitchen Lack of Transportation (Non-Medical):   Physical Activity:   . Days of Exercise per Week:   . Minutes of Exercise per Session:   Stress:   . Feeling of Stress :   Social Connections:   . Frequency of Communication with Friends and Family:   . Frequency of Social Gatherings with Friends and Family:   . Attends Religious  Services:   . Active Member of Clubs or Organizations:   . Attends Archivist Meetings:   Marland Kitchen Marital Status:     Her Allergies Are:  Allergies  Allergen Reactions  . Naproxen Other (See Comments)    Patient preference   . Prednisone Other (See Comments)    Patient Preference   . Amlodipine Hives  .  Losartan Rash  :   Her Current Medications Are:  Outpatient Encounter Medications as of 08/19/2019  Medication Sig  . albuterol (PROVENTIL HFA;VENTOLIN HFA) 108 (90 Base) MCG/ACT inhaler Inhale 1 puff into the lungs every 4 (four) hours as needed.  Marland Kitchen alendronate (FOSAMAX) 70 MG tablet Take 1 tablet by mouth once a week.  Marland Kitchen aspirin EC 81 MG tablet Take 81 mg by mouth daily.  . calcium citrate-vitamin D 500-400 MG-UNIT chewable tablet Chew 1 tablet by mouth 2 (two) times daily.   . carboxymethylcellulose (REFRESH PLUS) 0.5 % SOLN Place 1-2 drops into both eyes 3 (three) times daily.   . Cholecalciferol (VITAMIN D3) 10 MCG (400 UNIT) CAPS Take by mouth daily. 4 daily  . clobetasol cream (TEMOVATE) 0.05 % APPLY TO AFFECTED AREA DAILY  . cloNIDine (CATAPRES) 0.1 MG tablet Take 0.1 mg by mouth at bedtime and may repeat dose one time if needed.   . Coenzyme Q10 (COQ10) 200 MG CAPS Take 200 mg by mouth as needed.   . cycloSPORINE (RESTASIS) 0.05 % ophthalmic emulsion Place 1 drop into both eyes 2 (two) times daily.   . Emollient (CERAVE EX) Apply 1 application topically See admin instructions. Apply to affected areas daily as directed or as needed for dryness  . fluticasone (FLONASE) 50 MCG/ACT nasal spray Place 1 spray into both nostrils at bedtime as needed for allergies or rhinitis.  . Garlic 3149 MG CAPS Take 1 capsule by mouth daily.  . hydrALAZINE (APRESOLINE) 50 MG tablet Take 1 tablet (50 mg total) by mouth 3 (three) times daily.  . isosorbide dinitrate (ISORDIL) 20 MG tablet Take 1 tablet (20 mg total) by mouth 3 (three) times daily.  . Magnesium 250 MG TABS Take 1 tablet by  mouth daily.  . Multiple Vitamins-Minerals (MULTIVITAMIN WITH MINERALS) tablet Take 1 tablet by mouth daily. Centrum for Women  . omeprazole (PRILOSEC OTC) 20 MG tablet Take 20 mg by mouth daily as needed (for reflux).   . Pitavastatin Calcium (LIVALO) 1 MG TABS Take 1 mg by mouth daily.  . polyethylene glycol (MIRALAX / GLYCOLAX) packet Take 17 g by mouth at bedtime.   . pyridOXINE (VITAMIN B-6) 100 MG tablet Take 100 mg by mouth daily.   Marland Kitchen spironolactone (ALDACTONE) 50 MG tablet Take 50 mg by mouth daily.   Marland Kitchen triamcinolone cream (KENALOG) 0.1 % Apply 1 application topically 2 (two) times daily as needed (rash/ eczema).    No facility-administered encounter medications on file as of 08/19/2019.  :  Review of Systems:  Out of a complete 14 point review of systems, all are reviewed and negative with the exception of these symptoms as listed below: Review of Systems  Neurological:       Pain in head    Objective:  Neurological Exam  Physical Exam Physical Examination:   Vitals:   08/19/19 1021  BP: 125/62  Pulse: 92  Temp: (!) 97.4 F (36.3 C)    General Examination: The patient is a very pleasant 79 y.o. female in no acute distress. She appears well-developed and well-nourished and well groomed.   HEENT: Normocephalic, atraumatic, pupils are equal, round and reactive to light and accommodation. Corrective eyeglasses in place. Extraocular tracking is good, face is symmetric, no facial asymmetry noted, no dysarthria, no abnormal tongue movements.  Chest: Clear to auscultation without wheezing, rhonchi or crackles noted.  Heart: S1+S2+0, regular and normal without murmurs, rubs or gallops noted.   Abdomen: Soft, non-tender and non-distended with  normal bowel sounds appreciated on auscultation.  Extremities: There is no pitting edema in the distal lower extremities bilaterally.  Skin: Warm and dry without trophic changes noted.  Musculoskeletal: exam reveals no obvious  joint deformities, tenderness or joint swelling or erythema.   Neurologically:  Mental status: The patient is awake, alert and oriented in all 4 spheres. Her immediate and remote memory, attention, language skills and fund of knowledge are appropriate. There is no evidence of aphasia, agnosia, apraxia or anomia. Speech is clear with normal prosody and enunciation. Thought process is linear. Mood is normal and affect is normal.  Cranial nerves II - XII are as described above under HEENT exam. In addition: shoulder shrug is normal with equal shoulder height noted. Motor exam: Normal bulk, strength and tone is noted. There is no drift, tremor or rebound. Reflexes are 1+ throughout. Fine motor skills and coordination: grossly intact.  Cerebellar testing: No dysmetria or intention tremor.  Sensory exam: intact to light touch.  Gait, station and balance: She stands easily. No veering to one side is noted. No leaning to one side is noted. Posture is age-appropriate and stance is narrow based. Gait shows normal stride length and normal pace. No problems turning are noted.   Assessment and Plan:   In summary, CAMAURI FLEECE is an 79 year old female with an underlying medical history of hypertension, hyperlipidemia, reflux disease, allergic rhinitis, osteoporosis, osteoarthritis, asthma, vitamin D deficiency, carotid stenosis on the left, and chronic constipation, who presents for evaluation of her recurrent headaches which she has had for the past 2 months approximately.  Her history is not telltale for migraine headaches or tension type headaches.  She does report sinus issues and some correlation with elevated blood pressure values.  She has no telltale history concerning for temporal arteritis.  Nevertheless, I suggested we proceed with some blood work to look for inflammatory or autoimmune causes of her headaches and also repeat her brain MRI to rule out any structural cause.  She is encouraged to think  about seeing her ENT.  She has seen ENT in the past.  She reports issues with arthritis affecting her hands, lower back and right shoulder area.  She may benefit from seeing a rheumatologist.  From my end of things, we will proceed with blood work today and call her with her results and also request insurance authorization for a repeat brain MRI.  We will call her with her results.  So long as her test results are stable as far as the scan and blood test results benign, I suggested as needed follow-up.  She is encouraged to utilize Tylenol over-the-counter as needed for headache management.  I answered all her questions today and she was in agreement.

## 2019-08-19 NOTE — Patient Instructions (Signed)
I am not quite sure how to explain your headaches, your history is not suggestive of migraines.  Your headache may indeed correlate with blood pressure elevations.  We will look for treatable causes and underlying inflammatory or rheumatological disease, structural cause.  To that end, I will order a brain MRI with and without contrast and lab work today.  We will call you with your results.  So long as your test results are benign and your MRI is stable, I will see you back as needed.  Please continue to try to hydrate well, exercise in moderation, stay well rested, use Tylenol as needed.

## 2019-08-20 LAB — COMPREHENSIVE METABOLIC PANEL
ALT: 20 IU/L (ref 0–32)
AST: 25 IU/L (ref 0–40)
Albumin/Globulin Ratio: 1.8 (ref 1.2–2.2)
Albumin: 4.6 g/dL (ref 3.7–4.7)
Alkaline Phosphatase: 52 IU/L (ref 39–117)
BUN/Creatinine Ratio: 19 (ref 12–28)
BUN: 16 mg/dL (ref 8–27)
Bilirubin Total: 0.6 mg/dL (ref 0.0–1.2)
CO2: 20 mmol/L (ref 20–29)
Calcium: 9.4 mg/dL (ref 8.7–10.3)
Chloride: 105 mmol/L (ref 96–106)
Creatinine, Ser: 0.85 mg/dL (ref 0.57–1.00)
GFR calc Af Amer: 75 mL/min/{1.73_m2} (ref >59)
GFR calc non Af Amer: 65 mL/min/{1.73_m2} (ref >59)
Globulin, Total: 2.5 g/dL (ref 1.5–4.5)
Glucose: 118 mg/dL — ABNORMAL HIGH (ref 65–99)
Potassium: 4.2 mmol/L (ref 3.5–5.2)
Sodium: 138 mmol/L (ref 134–144)
Total Protein: 7.1 g/dL (ref 6.0–8.5)

## 2019-08-20 LAB — SEDIMENTATION RATE: Sed Rate: 18 mm/hr (ref 0–40)

## 2019-08-20 LAB — C-REACTIVE PROTEIN: CRP: 1 mg/L (ref 0–10)

## 2019-08-20 LAB — RHEUMATOID FACTOR: Rheumatoid fact SerPl-aCnc: <10 IU/mL (ref 0.0–13.9)

## 2019-08-20 LAB — ANA W/REFLEX: Anti Nuclear Antibody (ANA): NEGATIVE

## 2019-08-23 ENCOUNTER — Ambulatory Visit: Payer: Medicare Other | Admitting: Cardiology

## 2019-08-23 ENCOUNTER — Telehealth: Payer: Self-pay

## 2019-08-23 NOTE — Telephone Encounter (Signed)
I called pt and discussed her lab results. Pt verbalized understanding of results. Pt had no questions at this time but was encouraged to call back if questions arise.

## 2019-08-23 NOTE — Progress Notes (Signed)
Labs were fine, blood sugar slightly up, but was not a fasting level.  Please update pt.

## 2019-08-23 NOTE — Telephone Encounter (Signed)
-----   Message from Star Age, MD sent at 08/23/2019  7:42 AM EDT ----- Labs were fine, blood sugar slightly up, but was not a fasting level.  Please update pt.

## 2019-08-30 ENCOUNTER — Telehealth: Payer: Self-pay | Admitting: Neurology

## 2019-08-30 NOTE — Telephone Encounter (Signed)
Medicare/GHI order sent to GI. They will reach out to the patient to schedule.

## 2019-09-06 ENCOUNTER — Encounter: Payer: Self-pay | Admitting: Dermatology

## 2019-09-06 ENCOUNTER — Other Ambulatory Visit: Payer: Self-pay

## 2019-09-06 ENCOUNTER — Ambulatory Visit (INDEPENDENT_AMBULATORY_CARE_PROVIDER_SITE_OTHER): Payer: Medicare Other | Admitting: Dermatology

## 2019-09-06 DIAGNOSIS — L814 Other melanin hyperpigmentation: Secondary | ICD-10-CM

## 2019-09-06 NOTE — Progress Notes (Signed)
   Follow-Up Visit   Subjective  Sara Brown is a 79 y.o. female who presents for the following: Follow-up (Per patient, she is not sure why she is here.  Last visit was in September 2020.).  Pigmented spots  Location: Hands and feet Duration: Several years Quality: Stable Associated Signs/Symptoms: Modifying Factors:  Severity:  Timing: Context: Saw a podiatrist who told her to consider obtaining biopsies  The following portions of the chart were reviewed this encounter and updated as appropriate:     Objective  Well appearing patient in no apparent distress; mood and affect are within normal limits.  A focused examination was performed including hands, feet, nails.. Relevant physical exam findings are noted in the Assessment and Plan.   Assessment & Plan  Lentigo simplex Left Lateral Heel  There are a half dozen 2-38mm monochrome brown hand-foot macules; the one on the left outer heel is 52mm and bi-chromic but dermoscopy shows no atypical features. These best fit acral lentigines. Will biopsy if there is clinical change.

## 2019-09-06 NOTE — Patient Instructions (Addendum)
Ms. Reeter returns and her pigmented areas on her hands and feet are rechecked.  The note from her podiatry visit was reviewed.  She and I feel that the pigmented spots are stable.  Images taken.  I told Mrs. Gencarelli that to achieve [id number] certainty that these are benign would require biopsies, but if they are clinically stable it seems reasonable to simply watch them.  She was in agreement.  Routine recheck in 1 year unless there is clinical change. Addendum: she is certain that some of the pigmented spots are her hands are secondary to fingersticks to check blood sugar.

## 2019-09-08 ENCOUNTER — Other Ambulatory Visit: Payer: Self-pay

## 2019-09-08 DIAGNOSIS — I10 Essential (primary) hypertension: Secondary | ICD-10-CM

## 2019-09-08 MED ORDER — HYDRALAZINE HCL 50 MG PO TABS: 50.0000 mg | ORAL_TABLET | Freq: Three times a day (TID) | ORAL | 3 refills | Status: DC

## 2019-09-10 ENCOUNTER — Other Ambulatory Visit: Payer: Self-pay | Admitting: Cardiology

## 2019-09-10 DIAGNOSIS — I10 Essential (primary) hypertension: Secondary | ICD-10-CM

## 2019-09-10 MED ORDER — HYDRALAZINE HCL 50 MG PO TABS: 100.0000 mg | ORAL_TABLET | Freq: Three times a day (TID) | ORAL | 3 refills | Status: DC

## 2019-09-14 ENCOUNTER — Telehealth: Payer: Self-pay | Admitting: Podiatry

## 2019-09-14 NOTE — Telephone Encounter (Signed)
Pt called and left meesage checking status of diabetic shoes.   Upon checking pt was last seen by pcp oct of last yr so paperwork is not valid. I called pt and she stated she sees Dr Chalmers Cater for her diabetes but was not sure when her last visit was last yr. I called Dr Almetta Lovely office and her last appt there was June of 2020. I called pt and she is to call and get an appt with Dr Chalmers Cater and let me know and I will fax paperwork to that office.

## 2019-09-15 ENCOUNTER — Ambulatory Visit
Admission: RE | Admit: 2019-09-15 | Discharge: 2019-09-15 | Disposition: A | Discharge status: Home / Self Care | Payer: Medicare Other | Source: Ambulatory Visit | Attending: Neurology | Admitting: Neurology

## 2019-09-15 DIAGNOSIS — G4489 Other headache syndrome: Secondary | ICD-10-CM

## 2019-09-15 DIAGNOSIS — R519 Headache, unspecified: Secondary | ICD-10-CM

## 2019-09-15 MED ORDER — GADOBENATE DIMEGLUMINE 529 MG/ML IV SOLN
14.0000 mL | Freq: Once | INTRAVENOUS | Status: AC | PRN
  Administered 2019-09-15: 14 mL via INTRAVENOUS

## 2019-09-16 ENCOUNTER — Telehealth: Payer: Self-pay

## 2019-09-16 NOTE — Telephone Encounter (Signed)
I called pt to discuss. No answer, left a message asking her to call me back. 

## 2019-09-16 NOTE — Progress Notes (Signed)
Please advise patient that her recent brain MRI w/wo contrast from 09/15/19 showed stable findings, no acute findings.  As discussed, she can FU with her primary care.

## 2019-09-16 NOTE — Telephone Encounter (Signed)
-----   Message from Star Age, MD sent at 09/16/2019 12:20 PM EDT ----- Please advise patient that her recent brain MRI w/wo contrast from 09/15/19 showed stable findings, no acute findings.  As discussed, she can FU with her primary care.

## 2019-09-20 DIAGNOSIS — M810 Age-related osteoporosis without current pathological fracture: Secondary | ICD-10-CM | POA: Diagnosis not present

## 2019-09-20 DIAGNOSIS — E1169 Type 2 diabetes mellitus with other specified complication: Secondary | ICD-10-CM | POA: Diagnosis not present

## 2019-09-20 DIAGNOSIS — G459 Transient cerebral ischemic attack, unspecified: Secondary | ICD-10-CM | POA: Diagnosis not present

## 2019-09-20 DIAGNOSIS — Z1389 Encounter for screening for other disorder: Secondary | ICD-10-CM | POA: Diagnosis not present

## 2019-09-20 DIAGNOSIS — E7800 Pure hypercholesterolemia, unspecified: Secondary | ICD-10-CM | POA: Diagnosis not present

## 2019-09-20 DIAGNOSIS — Z0000 Encounter for general adult medical examination without abnormal findings: Secondary | ICD-10-CM | POA: Diagnosis not present

## 2019-09-20 DIAGNOSIS — I10 Essential (primary) hypertension: Secondary | ICD-10-CM | POA: Diagnosis not present

## 2019-09-20 NOTE — Telephone Encounter (Signed)
Pt returned call. Please call back when available. 

## 2019-09-20 NOTE — Telephone Encounter (Signed)
I called pt. I advised her of her MRI results and recommendations. Pt asked that a copy of her MRI be sent to Dr. Delfina Redwood. I will take care of this. Pt verbalized understanding of results. Pt had no questions at this time but was encouraged to call back if questions arise.

## 2019-10-03 ENCOUNTER — Other Ambulatory Visit: Payer: Self-pay | Admitting: Cardiology

## 2019-10-04 ENCOUNTER — Telehealth: Payer: Self-pay

## 2019-10-04 DIAGNOSIS — H35363 Drusen (degenerative) of macula, bilateral: Secondary | ICD-10-CM | POA: Diagnosis not present

## 2019-10-04 DIAGNOSIS — H35033 Hypertensive retinopathy, bilateral: Secondary | ICD-10-CM | POA: Diagnosis not present

## 2019-10-04 DIAGNOSIS — H40023 Open angle with borderline findings, high risk, bilateral: Secondary | ICD-10-CM | POA: Diagnosis not present

## 2019-10-04 DIAGNOSIS — E119 Type 2 diabetes mellitus without complications: Secondary | ICD-10-CM | POA: Diagnosis not present

## 2019-10-04 NOTE — Telephone Encounter (Signed)
I called patient back and let her know NOT take Hydralazine and to START Hydralazine 1/2 tablet in the morning. She mentioned that she takes Hydralazine 2 tablets 3xday. Are you wanting her to take 1/2 tablet 3xday? And also she wants to know if she needs to continue her Isosorbide 1 tab 3xday and Spironolactone 50mg 1 tab daily? Please advise.

## 2019-10-04 NOTE — Telephone Encounter (Signed)
As discussed, her BP recordings appear great. BUt due to symptoms, change hydralazine to 25 mg TID, continue Isosorbide tid. JG

## 2019-10-04 NOTE — Telephone Encounter (Signed)
Pt called to inform us that she took her Hydralazine 50 and isosorbide 20 at the same time and her bp got really low. Pt mention she is having SOB. Informed pt to call an ambulance pt denied to call due to insurance not covering those services. Pt would like to know what should she do

## 2019-10-04 NOTE — Telephone Encounter (Signed)
Sara Brown, please let her know to take only 1/2 hydralazine starting tomorrow. To hold medications today.  JG

## 2019-10-05 NOTE — Telephone Encounter (Signed)
Patient is aware 

## 2019-10-06 ENCOUNTER — Ambulatory Visit (INDEPENDENT_AMBULATORY_CARE_PROVIDER_SITE_OTHER): Payer: Medicare Other | Admitting: Podiatry

## 2019-10-06 ENCOUNTER — Other Ambulatory Visit: Payer: Self-pay

## 2019-10-06 ENCOUNTER — Encounter: Payer: Self-pay | Admitting: Podiatry

## 2019-10-06 ENCOUNTER — Other Ambulatory Visit: Payer: Self-pay | Admitting: Internal Medicine

## 2019-10-06 DIAGNOSIS — L84 Corns and callosities: Secondary | ICD-10-CM | POA: Diagnosis not present

## 2019-10-06 DIAGNOSIS — E1142 Type 2 diabetes mellitus with diabetic polyneuropathy: Secondary | ICD-10-CM | POA: Diagnosis not present

## 2019-10-06 DIAGNOSIS — Q828 Other specified congenital malformations of skin: Secondary | ICD-10-CM | POA: Diagnosis not present

## 2019-10-06 DIAGNOSIS — B351 Tinea unguium: Secondary | ICD-10-CM

## 2019-10-06 DIAGNOSIS — M79675 Pain in left toe(s): Secondary | ICD-10-CM | POA: Diagnosis not present

## 2019-10-06 DIAGNOSIS — M79674 Pain in right toe(s): Secondary | ICD-10-CM | POA: Diagnosis not present

## 2019-10-06 DIAGNOSIS — Z1231 Encounter for screening mammogram for malignant neoplasm of breast: Secondary | ICD-10-CM

## 2019-10-06 NOTE — Patient Instructions (Signed)
Diabetes Mellitus and Foot Care Foot care is an important part of your health, especially when you have diabetes. Diabetes may cause you to have problems because of poor blood flow (circulation) to your feet and legs, which can cause your skin to:  Become thinner and drier.  Break more easily.  Heal more slowly.  Peel and crack. You may also have nerve damage (neuropathy) in your legs and feet, causing decreased feeling in them. This means that you may not notice minor injuries to your feet that could lead to more serious problems. Noticing and addressing any potential problems early is the best way to prevent future foot problems. How to care for your feet Foot hygiene  Wash your feet daily with warm water and mild soap. Do not use hot water. Then, pat your feet and the areas between your toes until they are completely dry. Do not soak your feet as this can dry your skin.  Trim your toenails straight across. Do not dig under them or around the cuticle. File the edges of your nails with an emery board or nail file.  Apply a moisturizing lotion or petroleum jelly to the skin on your feet and to dry, brittle toenails. Use lotion that does not contain alcohol and is unscented. Do not apply lotion between your toes. Shoes and socks  Wear clean socks or stockings every day. Make sure they are not too tight. Do not wear knee-high stockings since they may decrease blood flow to your legs.  Wear shoes that fit properly and have enough cushioning. Always look in your shoes before you put them on to be sure there are no objects inside.  To break in new shoes, wear them for just a few hours a day. This prevents injuries on your feet. Wounds, scrapes, corns, and calluses  Check your feet daily for blisters, cuts, bruises, sores, and redness. If you cannot see the bottom of your feet, use a mirror or ask someone for help.  Do not cut corns or calluses or try to remove them with medicine.  If you  find a minor scrape, cut, or break in the skin on your feet, keep it and the skin around it clean and dry. You may clean these areas with mild soap and water. Do not clean the area with peroxide, alcohol, or iodine.  If you have a wound, scrape, corn, or callus on your foot, look at it several times a day to make sure it is healing and not infected. Check for: ? Redness, swelling, or pain. ? Fluid or blood. ? Warmth. ? Pus or a bad smell. General instructions  Do not cross your legs. This may decrease blood flow to your feet.  Do not use heating pads or hot water bottles on your feet. They may burn your skin. If you have lost feeling in your feet or legs, you may not know this is happening until it is too late.  Protect your feet from hot and cold by wearing shoes, such as at the beach or on hot pavement.  Schedule a complete foot exam at least once a year (annually) or more often if you have foot problems. If you have foot problems, report any cuts, sores, or bruises to your health care provider immediately. Contact a health care provider if:  You have a medical condition that increases your risk of infection and you have any cuts, sores, or bruises on your feet.  You have an injury that is not   healing.  You have redness on your legs or feet.  You feel burning or tingling in your legs or feet.  You have pain or cramps in your legs and feet.  Your legs or feet are numb.  Your feet always feel cold.  You have pain around a toenail. Get help right away if:  You have a wound, scrape, corn, or callus on your foot and: ? You have pain, swelling, or redness that gets worse. ? You have fluid or blood coming from the wound, scrape, corn, or callus. ? Your wound, scrape, corn, or callus feels warm to the touch. ? You have pus or a bad smell coming from the wound, scrape, corn, or callus. ? You have a fever. ? You have a red line going up your leg. Summary  Check your feet every day  for cuts, sores, red spots, swelling, and blisters.  Moisturize feet and legs daily.  Wear shoes that fit properly and have enough cushioning.  If you have foot problems, report any cuts, sores, or bruises to your health care provider immediately.  Schedule a complete foot exam at least once a year (annually) or more often if you have foot problems. This information is not intended to replace advice given to you by your health care provider. Make sure you discuss any questions you have with your health care provider. Document Revised: 02/17/2019 Document Reviewed: 06/28/2016 Else[employer] Patient Education  2020 [employer]  Peripheral Neuropathy Peripheral neuropathy is a type of nerve damage. It affects nerves that carry signals between the spinal cord and the arms, legs, and the rest of the body (peripheral nerves). It does not affect nerves in the spinal cord or brain. In peripheral neuropathy, one nerve or a group of nerves may be damaged. Peripheral neuropathy is a broad category that includes many specific nerve disorders, like diabetic neuropathy, hereditary neuropathy, and carpal tunnel syndrome. What are the causes? This condition may be caused by:  Diabetes. This is the most common cause of peripheral neuropathy.  Nerve injury.  Pressure or stress on a nerve that lasts a long time.  Lack (deficiency) of B vitamins. This can result from alcoholism, poor diet, or a restricted diet.  Infections.  Autoimmune diseases, such as rheumatoid arthritis and systemic lupus erythematosus.  Nerve diseases that are passed from parent to child (inherited).  Some medicines, such as cancer medicines (chemotherapy).  Poisonous (toxic) substances, such as lead and mercury.  Too little blood flowing to the legs.  Kidney disease.  Thyroid disease. In some cases, the cause of this condition is not known. What are the signs or symptoms? Symptoms of this condition depend on which of your  nerves is damaged. Common symptoms include:  Loss of feeling (numbness) in the feet, hands, or both.  Tingling in the feet, hands, or both.  Burning pain.  Very sensitive skin.  Weakness.  Not being able to move a part of the body (paralysis).  Muscle twitching.  Clumsiness or poor coordination.  Loss of balance.  Not being able to control your bladder.  Feeling dizzy.  Sexual problems. How is this diagnosed? Diagnosing and finding the cause of peripheral neuropathy can be difficult. Your health care provider will take your medical history and do a physical exam. A neurological exam will also be done. This involves checking things that are affected by your brain, spinal cord, and nerves (nervous system). For example, your health care provider will check your reflexes, how you move, and   what you can feel. You may have other tests, such as:  Blood tests.  Electromyogram (EMG) and nerve conduction tests. These tests check nerve function and how well the nerves are controlling the muscles.  Imaging tests, such as CT scans or MRI to rule out other causes of your symptoms.  Removing a small piece of nerve to be examined in a lab (nerve biopsy). This is rare.  Removing and examining a small amount of the fluid that surrounds the brain and spinal cord (lumbar puncture). This is rare. How is this treated? Treatment for this condition may involve:  Treating the underlying cause of the neuropathy, such as diabetes, kidney disease, or vitamin deficiencies.  Stopping medicines that can cause neuropathy, such as chemotherapy.  Medicine to relieve pain. Medicines may include: ? Prescription or over-the-counter pain medicine. ? Antiseizure medicine. ? Antidepressants. ? Pain-relieving patches that are applied to painful areas of skin.  Surgery to relieve pressure on a nerve or to destroy a nerve that is causing pain.  Physical therapy to help improve movement and  balance.  Devices to help you move around (assistive devices). Follow these instructions at home: Medicines  Take over-the-counter and prescription medicines only as told by your health care provider. Do not take any other medicines without first asking your health care provider.  Do not drive or use heavy machinery while taking prescription pain medicine. Lifestyle   Do not use any products that contain nicotine or tobacco, such as cigarettes and e-cigarettes. Smoking keeps blood from reaching damaged nerves. If you need help quitting, ask your health care provider.  Avoid or limit alcohol. Too much alcohol can cause a vitamin B deficiency, and vitamin B is needed for healthy nerves.  Eat a healthy diet. This includes: ? Eating foods that are high in fiber, such as fresh fruits and vegetables, whole grains, and beans. ? Limiting foods that are high in fat and processed sugars, such as fried or sweet foods. General instructions   If you have diabetes, work closely with your health care provider to keep your blood sugar under control.  If you have numbness in your feet: ? Check every day for signs of injury or infection. Watch for redness, warmth, and swelling. ? Wear padded socks and comfortable shoes. These help protect your feet.  Develop a good support system. Living with peripheral neuropathy can be stressful. Consider talking with a mental health specialist or joining a support group.  Use assistive devices and attend physical therapy as told by your health care provider. This may include using a walker or a cane.  Keep all follow-up visits as told by your health care provider. This is important. Contact a health care provider if:  You have new signs or symptoms of peripheral neuropathy.  You are struggling emotionally from dealing with peripheral neuropathy.  Your pain is not well-controlled. Get help right away if:  You have an injury or infection that is not healing  normally.  You develop new weakness in an arm or leg.  You fall frequently. Summary  Peripheral neuropathy is when the nerves in the arms, or legs are damaged, resulting in numbness, weakness, or pain.  There are many causes of peripheral neuropathy, including diabetes, pinched nerves, vitamin deficiencies, autoimmune disease, and hereditary conditions.  Diagnosing and finding the cause of peripheral neuropathy can be difficult. Your health care provider will take your medical history, do a physical exam, and do tests, including blood tests and nerve function tests.    Treatment involves treating the underlying cause of the neuropathy and taking medicines to help control pain. Physical therapy and assistive devices may also help. This information is not intended to replace advice given to you by your health care provider. Make sure you discuss any questions you have with your health care provider. Document Revised: 05/09/2017 Document Reviewed: 08/05/2016 Elsevier Patient Education  2020 [employer]  

## 2019-10-10 NOTE — Progress Notes (Signed)
Subjective: Sara Brown presents today for follow up of preventative diabetic foot care, painful mycotic nails b/l that are difficult to trim. Pain interferes with ambulation. Aggravating factors include wearing enclosed shoe gear. Pain is relieved with periodic professional debridement and painful plantar lesions b/l feet.  Pain prevent comfortable ambulation. Aggravating factor is weightbearing with or without shoegear..   Allergies  Allergen Reactions  . Naproxen Other (See Comments)    Patient preference   . Prednisone Other (See Comments)    Patient Preference   . Amlodipine Hives  . Losartan Rash     Objective: There were no vitals filed for this visit.  Pt is a pleasant 79 y.o. year old AA female WD, WN in NAD. AAO x 3.   Vascular Examination:  Capillary refill time to digits immediate b/l. Palpable DP pulses b/l. Palpable PT pulses b/l. Pedal hair absent b/l Skin temperature gradient within normal limits b/l. No edema noted b/l. No pain with calf compression b/l.  Dermatological Examination: Pedal skin with normal turgor, texture and tone bilaterally. No open wounds bilaterally. No interdigital macerations bilaterally. Toenails 1-5 b/l elongated, dystrophic, thickened, crumbly with subungual debris and tenderness to dorsal palpation. Hyperkeratotic lesion(s) submet head 1 right foot.  No erythema, no edema, no drainage, no flocculence. Porokeratotic lesion(s) plantarlateral arch left foot. No erythema, no edema, no drainage, no flocculence.  Musculoskeletal: Normal muscle strength 5/5 to all lower extremity muscle groups bilaterally. No pain crepitus or joint limitation noted with ROM b/l. Hammertoes noted to the 2-5 bilaterally.  Neurological: Protective sensation diminished with 10g monofilament b/l. Vibratory sensation intact b/l.  Assessment: 1. Pain due to onychomycosis of toenails of both feet   2. Porokeratosis   3. Callus   4. Diabetic peripheral  neuropathy associated with type 2 diabetes mellitus ([place])    Plan: -No new findings. No new orders. -Continue diabetic foot care principles. Literature dispensed on today.  -Toenails 1-5 b/l were debrided in length and girth with sterile nail nippers and dremel without iatrogenic bleeding.  -Callus(es) submet head 1 right foot pared utilizing sterile scalpel blade without complication or incident. Total number debrided =1. -Painful porokeratotic lesion(s) plantarlateral arch left foot pared and enucleated with sterile scalpel blade without incident. Total number debrided=1. -Patient to continue soft, supportive shoe gear daily. -Patient to report any pedal injuries to medical professional immediately. -Patient/POA to call should there be question/concern in the interim.  Return in about 3 months (around 01/05/2020) for diabetic nail and callus trim.

## 2019-10-11 ENCOUNTER — Emergency Department (HOSPITAL_COMMUNITY)
Admission: EM | Admit: 2019-10-11 | Discharge: 2019-10-11 | Disposition: A | Discharge status: Home / Self Care | Payer: Medicare Other | Attending: Emergency Medicine | Admitting: Emergency Medicine

## 2019-10-11 ENCOUNTER — Encounter (HOSPITAL_COMMUNITY): Payer: Self-pay

## 2019-10-11 ENCOUNTER — Telehealth: Payer: Self-pay | Admitting: Pharmacist

## 2019-10-11 ENCOUNTER — Other Ambulatory Visit: Payer: Self-pay

## 2019-10-11 ENCOUNTER — Emergency Department (HOSPITAL_COMMUNITY): Payer: Medicare Other

## 2019-10-11 DIAGNOSIS — I2510 Atherosclerotic heart disease of native coronary artery without angina pectoris: Secondary | ICD-10-CM | POA: Insufficient documentation

## 2019-10-11 DIAGNOSIS — Z7982 Long term (current) use of aspirin: Secondary | ICD-10-CM | POA: Diagnosis not present

## 2019-10-11 DIAGNOSIS — I10 Essential (primary) hypertension: Secondary | ICD-10-CM | POA: Insufficient documentation

## 2019-10-11 DIAGNOSIS — E119 Type 2 diabetes mellitus without complications: Secondary | ICD-10-CM | POA: Insufficient documentation

## 2019-10-11 DIAGNOSIS — Z8673 Personal history of transient ischemic attack (TIA), and cerebral infarction without residual deficits: Secondary | ICD-10-CM | POA: Diagnosis not present

## 2019-10-11 DIAGNOSIS — Z79899 Other long term (current) drug therapy: Secondary | ICD-10-CM | POA: Diagnosis not present

## 2019-10-11 DIAGNOSIS — R0602 Shortness of breath: Secondary | ICD-10-CM | POA: Diagnosis not present

## 2019-10-11 LAB — URINALYSIS, ROUTINE W REFLEX MICROSCOPIC
Bilirubin Urine: NEGATIVE
Glucose, UA: NEGATIVE mg/dL
Hgb urine dipstick: NEGATIVE
Ketones, ur: NEGATIVE mg/dL
Leukocytes,Ua: NEGATIVE
Nitrite: NEGATIVE
Protein, ur: NEGATIVE mg/dL
Specific Gravity, Urine: 1.002 — ABNORMAL LOW (ref 1.005–1.030)
pH: 7 (ref 5.0–8.0)

## 2019-10-11 LAB — CBC
HCT: 40 % (ref 36.0–46.0)
Hemoglobin: 12.8 g/dL (ref 12.0–15.0)
MCH: 29.3 pg (ref 26.0–34.0)
MCHC: 32 g/dL (ref 30.0–36.0)
MCV: 91.5 fL (ref 80.0–100.0)
Platelets: 252 10*3/uL (ref 150–400)
RBC: 4.37 MIL/uL (ref 3.87–5.11)
RDW: 13.5 % (ref 11.5–15.5)
WBC: 5.4 10*3/uL (ref 4.0–10.5)
nRBC: 0 % (ref 0.0–0.2)

## 2019-10-11 LAB — BASIC METABOLIC PANEL
Anion gap: 7 (ref 5–15)
BUN: 15 mg/dL (ref 8–23)
CO2: 26 mmol/L (ref 22–32)
Calcium: 9.7 mg/dL (ref 8.9–10.3)
Chloride: 103 mmol/L (ref 98–111)
Creatinine, Ser: 0.86 mg/dL (ref 0.44–1.00)
GFR calc Af Amer: >60 mL/min (ref >60)
GFR calc non Af Amer: >60 mL/min (ref >60)
Glucose, Bld: 123 mg/dL — ABNORMAL HIGH (ref 70–99)
Potassium: 4.8 mmol/L (ref 3.5–5.1)
Sodium: 136 mmol/L (ref 135–145)

## 2019-10-11 LAB — TROPONIN I (HIGH SENSITIVITY): Troponin I (High Sensitivity): 3 ng/L (ref <18)

## 2019-10-11 MED ORDER — HYDRALAZINE HCL 25 MG PO TABS
50.0000 mg | ORAL_TABLET | Freq: Once | ORAL | Status: AC
  Administered 2019-10-11: 50 mg via ORAL
  Filled 2019-10-11: qty 2

## 2019-10-11 NOTE — Telephone Encounter (Signed)
Pt called regarding concerns of elevated BP readings over the past few days and worsening symptoms. Pt reported BP readings below  5/1: 170/98 5/2: 185/90 5/3: 185/98   Pt complaining of numbness and tingling sensation in the left side of her face, tongue, and lips. Tongue numbness affecting her speech. Pt also having severe hacking cough and deep and heavy breathing while on the phone. Pt also attests to feeling discomfort in the middle of her sternum that recently started to get more bothersome. Denies complains of severe HA, vision changes, arm weakness, nausea or vomiting.   Advised pt to go to an ER for further evaluation and management of her current symptoms. Pt stated that she will have her husband take her to the ER.

## 2019-10-11 NOTE — Discharge Instructions (Signed)
Begin taking your hydralazine 100mg  in the morning, 50mg  in the afternoon, and 100mg  at night. (Close to how you had been taking your medications prior to April 26th) and follow up with your doctor.

## 2019-10-11 NOTE — ED Provider Notes (Signed)
[facility]ERG[facility] DEPARTMENT Provider Note   CSN: [id number] Arrival date & time: 10/11/19  1224     History Chief Complaint  Patient presents with  . Shortness of Breath  . Hypertension    Sara Brown is a 79 y.o. female.  HPI      79 year old female with a history of hypertension, hyperlipidemia, asthma, carotid stenosis, diabetes, TIA, who presents with concern for elevated blood pressures. BP this AM 710AM took medicine early because BP185/98 Told Dr. Earnie Larsson 8, 12 and 4 159/96 so took medication at 10AM On Sunday was 185/90, on Saturday 170/98-high for her. Called Dr. Gabriel Carina and told to come to the ED  When BP high has had sensation on tongue, difficult to explain, having it now  BP was low 4/26, so called Dr. Gabriel Carina and Dr. Einar Gip recommended Clonidine .1mg  at night Was rx Hydralazine 2 50mg  tablets three times a day, then but has been taking 1 tablets. Previously was taking 100mg  TID for possibly a few months (at least if not more).   BP running high over the last 3 days  Slight dyspnea, tiny bit of chest pain today, earlier today did have a few seconds of shoulder discomfort, mild  No n/v/HA   Past Medical History:  Diagnosis Date  . Allergic rhinitis   . Arthritis    "fingers, knees" (02/09/2018)  . Asthma    "stopped taking RX after dr said I don't have this" (02/09/2018)  . Carotid stenosis    ICA(L)  . Complication of anesthesia    "felt the cut w/one of my c-sections" (02/09/2018)  . Constipation   . GERD (gastroesophageal reflux disease)   . Headache    "years ago; before menopause" (02/09/2018)  . Hyperlipidemia   . Hypertension   . Neuropathy    face (R)  . OA (osteoarthritis)   . Osteoporosis   . TIA (transient ischemic attack) ?2005  . Type II diabetes mellitus ([place])   . Vitamin D deficiency     Patient Active Problem List   Diagnosis Date Noted  . Coronary artery disease involving native coronary artery of  native heart without angina pectoris 09/17/2018  . Diabetes mellitus type 2 in nonobese ([place]) 03/31/2018  . Essential hypertension 03/31/2018  . Dyslipidemia 03/31/2018  . Chest pain 02/10/2018  . Chest pain due to coronary artery disease ([place]) 02/09/2018  . Angina at rest [facility]) 02/09/2018  . Bradycardia 02/09/2018  . Skin laxity 06/12/2017  . Hirsutism 06/12/2017  . Hyperpigmentation 02/13/2017  . Neck mass 01/17/2017  . 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 Ear pain, left 02/21/2016  . GERD (gastroesophageal reflux disease)   . Shortness of breath 07/30/2007  . Cough 07/30/2007    Past Surgical History:  Procedure Laterality Date  . CATARACT EXTRACTION W/ INTRAOCULAR LENS  IMPLANT, BILATERAL Bilateral   . CESAREAN SECTION  1971; 1975  . INTRAVASCULAR PRESSURE WIRE/FFR STUDY N/A 04/01/2018   Procedure: INTRAVASCULAR PRESSURE WIRE/FFR STUDY;  Surgeon: Nigel Mormon, MD;  Location: [facility];  Service: Cardiovascular;  Laterality: N/A;  . LEFT HEART CATH AND CORONARY ANGIOGRAPHY N/A 04/01/2018   Procedure: LEFT HEART CATH AND CORONARY ANGIOGRAPHY;  Surgeon: Nigel Mormon, MD;  Location: [facility];  Service: Cardiovascular;  Laterality: N/A;  . TUBAL LIGATION  1975     OB History   No obstetric history on file.  Family History  Problem Relation Age of Onset  . Diabetes Mellitus II Mother   . Osteoporosis Mother   . Hypertension Mother   . Diabetes Mellitus II Father   . CVA Sister        4  . CAD Sister        4  . Hypertension Sister        4  . Osteoarthritis Sister        4  . Alcoholism Brother   . Breast cancer Cousin 10    Social History   Tobacco Use  . Smoking status: Never Smoker  . Smokeless tobacco: Never Used  Substance Use Topics  . Alcohol use: Never  . Drug use: Never    Home Medications Prior to Admission medications   Medication Sig Start Date End  Date Taking? Authorizing Provider  albuterol (PROVENTIL HFA;VENTOLIN HFA) 108 (90 Base) MCG/ACT inhaler Inhale 1 puff into the lungs every 4 (four) hours as needed. 07/23/18   [provider]  alendronate (FOSAMAX) 70 MG tablet Take 1 tablet by mouth once a week. 08/09/18   [provider]  aspirin EC 81 MG tablet Take 81 mg by mouth daily.    [provider]  calcium citrate-vitamin D 500-400 MG-UNIT chewable tablet Chew 1 tablet by mouth 2 (two) times daily.     [provider]  carboxymethylcellulose (REFRESH PLUS) 0.5 % SOLN Place 1-2 drops into both eyes 3 (three) times daily.     [provider]  Cholecalciferol (VITAMIN D3) 10 MCG (400 UNIT) CAPS Take by mouth daily. 4 daily    [provider]  clobetasol cream (TEMOVATE) 0.05 % APPLY TO AFFECTED AREA DAILY 03/17/19   [provider]  cloNIDine (CATAPRES) 0.1 MG tablet Take 0.1 mg by mouth at bedtime and may repeat dose one time if needed.  10/01/17   [provider]  Coenzyme Q10 (COQ10) 200 MG CAPS Take 200 mg by mouth as needed.     [provider]  cycloSPORINE (RESTASIS) 0.05 % ophthalmic emulsion Place 1 drop into both eyes 2 (two) times daily.     [provider]  Emollient (CERAVE EX) Apply 1 application topically See admin instructions. Apply to affected areas daily as directed or as needed for dryness    [provider]  fluticasone (FLONASE) 50 MCG/ACT nasal spray Place 1 spray into both nostrils at bedtime as needed for allergies or rhinitis.    [provider]  Garlic 123XX123 MG CAPS Take 1 capsule by mouth daily.    [provider]  hydrALAZINE (APRESOLINE) 50 MG tablet Take 2 tablets (100 mg total) by mouth 3 (three) times daily. Patient taking differently: Take 50 mg by mouth 3 (three) times daily.  09/10/19 12/09/19  Patwardhan, Reynold Bowen, MD  isosorbide dinitrate (ISORDIL) 20 MG tablet TAKE 1 TABLET BY MOUTH THREE TIMES A  DAY 10/04/19   Patwardhan, Manish J, MD  Magnesium 250 MG TABS Take 1 tablet by mouth daily.    [provider]  Multiple Vitamins-Minerals (MULTIVITAMIN WITH MINERALS) tablet Take 1 tablet by mouth daily. Centrum for Women    [provider]  omeprazole (PRILOSEC OTC) 20 MG tablet Take 20 mg by mouth daily as needed (for reflux).     [provider]  Pitavastatin Calcium (LIVALO) 1 MG TABS Take 1 mg by mouth daily.    [provider]  polyethylene glycol (MIRALAX / GLYCOLAX) packet Take 17 g by  mouth at bedtime.     [provider]  pyridOXINE (VITAMIN B-6) 100 MG tablet Take 100 mg by mouth daily.     [provider]  spironolactone (ALDACTONE) 50 MG tablet Take 50 mg by mouth daily.     [provider]  triamcinolone cream (KENALOG) 0.1 % Apply 1 application topically 2 (two) times daily as needed (rash/ eczema).  11/01/14   [provider]    Allergies    Naproxen, Prednisone, Amlodipine, and Losartan  Review of Systems   Review of Systems  Constitutional: Negative for fever.  HENT: Negative for sore throat.   Eyes: Negative for visual disturbance.  Respiratory: Positive for cough (chronic post nasal drip') and shortness of breath (slight).   Cardiovascular: Positive for chest pain (acknowledges "tiny tiny bit" on ROS).  Gastrointestinal: Negative for abdominal pain, nausea and vomiting.  Genitourinary: Negative for difficulty urinating.  Musculoskeletal: Negative for back pain and neck pain.  Skin: Negative for rash.  Neurological: Negative for syncope, facial asymmetry, speech difficulty, weakness, numbness and headaches.    Physical Exam Updated Vital Signs BP (!) 171/86   Pulse 65   Temp 97.6 F (36.4 C) (Oral)   Resp 18   Ht 5\' 6"  (1.676 m)   Wt 67.1 kg   SpO2 98%   BMI 23.89 kg/m   Physical Exam Vitals and nursing note reviewed.  Constitutional:      General: She is not in acute distress.     Appearance: Normal appearance. She is well-developed. She is not ill-appearing or diaphoretic.  HENT:     Head: Normocephalic and atraumatic.  Eyes:     General: No visual field deficit.    Extraocular Movements: Extraocular movements intact.     Conjunctiva/sclera: Conjunctivae normal.     Pupils: Pupils are equal, round, and reactive to light.  Cardiovascular:     Rate and Rhythm: Normal rate and regular rhythm.     Pulses: Normal pulses.     Heart sounds: Normal heart sounds. No murmur. No friction rub. No gallop.   Pulmonary:     Effort: Pulmonary effort is normal. No respiratory distress.     Breath sounds: Normal breath sounds. No wheezing or rales.  Abdominal:     General: There is no distension.     Palpations: Abdomen is soft.     Tenderness: There is no abdominal tenderness. There is no guarding.  Musculoskeletal:        General: No swelling or tenderness.     Cervical back: Normal range of motion.  Skin:    General: Skin is warm and dry.     Findings: No erythema or rash.  Neurological:     General: No focal deficit present.     Mental Status: She is alert and oriented to person, place, and time.     GCS: GCS eye subscore is 4. GCS verbal subscore is 5. GCS motor subscore is 6.     Cranial Nerves: No cranial nerve deficit, dysarthria or facial asymmetry.     Sensory: No sensory deficit.     Motor: No weakness or tremor.     Coordination: Coordination normal. Finger-Nose-Finger Test normal.     Gait: Gait normal.     ED Results / Procedures / Treatments   Labs (all labs ordered are listed, but only abnormal results are displayed) Labs Reviewed  BASIC METABOLIC PANEL - Abnormal; Notable for the following components:      Result Value  Glucose, Bld 123 (*)    All other components within normal limits  URINALYSIS, ROUTINE W REFLEX MICROSCOPIC - Abnormal; Notable for the following components:   Color, Urine STRAW (*)    Specific Gravity, Urine 1.002 (*)    All  other components within normal limits  CBC  TROPONIN I (HIGH SENSITIVITY)  TROPONIN I (HIGH SENSITIVITY)    EKG EKG Interpretation  Date/Time:  Monday Oct 11 2019 12:51:41 EDT Ventricular Rate:  55 PR Interval:  168 QRS Duration: 88 QT Interval:  408 QTC Calculation: 390 R Axis:   -32 Text Interpretation: Sinus bradycardia Left axis deviation Cannot rule out Anterior infarct , age undetermined Abnormal ECG No significant change since last tracing Confirmed by Gareth Morgan [phone]) on 10/11/2019 4:13:33 PM   Radiology DG Chest 2 View  Result Date: 10/11/2019 CLINICAL DATA:  Shortness of breath EXAM: CHEST - 2 VIEW COMPARISON:  09/24/2018 FINDINGS: Linear scarring at the left base. Lungs otherwise clear. Heart is normal size. No effusions or acute bony abnormality. IMPRESSION: No active cardiopulmonary disease. Electronically Signed   By: Rolm Baptise M.D.   On: 10/11/2019 17:48    Procedures Procedures (including critical care time)  Medications Ordered in ED Medications  hydrALAZINE (APRESOLINE) tablet 50 mg (50 mg Oral Given 10/11/19 1802)    ED Course  I have reviewed the triage vital signs and the nursing notes.  Pertinent labs & imaging results that were available during my care of the patient were reviewed by me and considered in my medical decision making (see chart for details).    MDM Rules/Calculators/A&P                      79 year old female with a history of hypertension, hyperlipidemia, asthma, carotid stenosis, diabetes, TIA, who presents with concern for elevated blood pressures in the setting of recently decreasing her hydralazine from 100mg  TID to 50mg  TID.  She had decreased the dose as her blood pressures were low on 4/26 although she is not sure of the reading--she reports she had been taking 100mg  TID for a long time prior to this event.  Patient without headache, no neurologic symptoms/normal neurologic exam, no significant dyspnea or chest pain,  normal XR, normal troponin and have low suspicion for hypertensive emergencies including low suspicion for [facility], hypertensive encephalopathy, stroke, MI, aortic dissection, pulmonary edema.  Provided patient with dose of home medication.  Discussed that it is likely her now elevated blood pressures are in the setting of recent decrease in her hydralazine to half the prior dose and recommend increase in medication. (to less than 100mg  TID-- although she tolerated this does for a long time prior to symptoms on 4/26.)  Discussed importance of close primary care follow up and reasons to return to the ED in detail. Patient discharged in stable condition with understanding of reasons to return.      Final Clinical Impression(s) / ED Diagnoses Final diagnoses:  Essential hypertension    Rx / DC Orders ED Discharge Orders    None       Gareth Morgan, MD 10/11/19 2306

## 2019-10-11 NOTE — ED Triage Notes (Signed)
Pt reports HTN (160s) and sob for the past week, took her Bp medications this morning. Pt denies chest pain. Pt a.o, nad noted

## 2019-10-12 ENCOUNTER — Ambulatory Visit
Admission: RE | Admit: 2019-10-12 | Discharge: 2019-10-12 | Disposition: A | Discharge status: Home / Self Care | Payer: Medicare Other | Source: Ambulatory Visit | Attending: Internal Medicine | Admitting: Internal Medicine

## 2019-10-12 DIAGNOSIS — Z1231 Encounter for screening mammogram for malignant neoplasm of breast: Secondary | ICD-10-CM | POA: Diagnosis not present

## 2019-10-15 NOTE — Progress Notes (Signed)
Follow up visit  Subjective:   Sara Brown, female    DOB: 1940/09/11, 79 y.o.   MRN: [id number]   Chief Complaint  Patient presents with  . Hypertension  . Follow-up    79 y/o African-American female with hypertension, diet controlled diabetes, hyperlipidemia, GERD and remote history of TIA, mild nonobstructive coronary artery disease (Cath 04/01/2018).  The patient was seen on 10/11/19 at the emergency department of [facility] for SOB and HTN that was attributed to recent decrease in her hydralazine medication. Troponin was negative, there was low suspicion for hypertensive emergencies including low suspicion for [facility], hypertensive encephalopathy, stroke, MI, aortic dissection, pulmonary edema. She was thus discharged home. BP has been very well controlled on home checks since then, ranging 106/61 to 140/78 mmHg.  Of note, patient has historically been intolerant to several antihypertensive medications.  Today, she is doing well. Blood pressure is much better controlled.   Current Outpatient Medications on File Prior to Visit  Medication Sig Dispense Refill  . albuterol (PROVENTIL HFA;VENTOLIN HFA) 108 (90 Base) MCG/ACT inhaler Inhale 1 puff into the lungs every 4 (four) hours as needed.    Marland Kitchen alendronate (FOSAMAX) 70 MG tablet Take 1 tablet by mouth once a week.    Marland Kitchen aspirin EC 81 MG tablet Take 81 mg by mouth daily.    . calcium citrate-vitamin D 500-400 MG-UNIT chewable tablet Chew 1 tablet by mouth 2 (two) times daily.     . carboxymethylcellulose (REFRESH PLUS) 0.5 % SOLN Place 1-2 drops into both eyes 3 (three) times daily.     . Cholecalciferol (VITAMIN D3) 10 MCG (400 UNIT) CAPS Take by mouth daily. 4 daily    . clobetasol cream (TEMOVATE) 0.05 % APPLY TO AFFECTED AREA DAILY    . cloNIDine (CATAPRES) 0.1 MG tablet Take 0.1 mg by mouth at bedtime and may repeat dose one time if needed.   3  . Coenzyme Q10 (COQ10) 200 MG CAPS Take 200 mg by mouth as needed.      . cycloSPORINE (RESTASIS) 0.05 % ophthalmic emulsion Place 1 drop into both eyes 2 (two) times daily.     . Emollient (CERAVE EX) Apply 1 application topically See admin instructions. Apply to affected areas daily as directed or as needed for dryness    . fluticasone (FLONASE) 50 MCG/ACT nasal spray Place 1 spray into both nostrils at bedtime as needed for allergies or rhinitis.    . Garlic 7893 MG CAPS Take 1 capsule by mouth daily.    . hydrALAZINE (APRESOLINE) 50 MG tablet Take 2 tablets (100 mg total) by mouth 3 (three) times daily. (Patient taking differently: Take 50 mg by mouth 3 (three) times daily. ) 360 tablet 3  . isosorbide dinitrate (ISORDIL) 20 MG tablet TAKE 1 TABLET BY MOUTH THREE TIMES A DAY 90 tablet 5  . Magnesium 250 MG TABS Take 1 tablet by mouth daily.    . Multiple Vitamins-Minerals (MULTIVITAMIN WITH MINERALS) tablet Take 1 tablet by mouth daily. Centrum for Women    . omeprazole (PRILOSEC OTC) 20 MG tablet Take 20 mg by mouth daily as needed (for reflux).     . Pitavastatin Calcium (LIVALO) 1 MG TABS Take 1 mg by mouth daily.    . polyethylene glycol (MIRALAX / GLYCOLAX) packet Take 17 g by mouth at bedtime.     . pyridOXINE (VITAMIN B-6) 100 MG tablet Take 100 mg by mouth daily.     Marland Kitchen spironolactone (ALDACTONE) 50  MG tablet Take 50 mg by mouth daily.     . triamcinolone cream (KENALOG) 0.1 % Apply 1 application topically 2 (two) times daily as needed (rash/ eczema).   2   No current facility-administered medications on file prior to visit.    Cardiovascular & other pertient studies:  EKG 07/06/2019: Sinus rhythm 63 bpm. Left axis deviation. Otherwise normal EKG.  Cath 04/01/2018: LM: Normal LAD: Prox-mid 40% stenosis. iFR 0.99 (Physiologically nonsignificant) LCx: Dominant. Small branches with mild disease RCA: Nondominant. Normal   LVEDP mildly elevated.   Carotid artery duplex 02/23/2018: No hemodynamically significant arterial disease in the internal  carotid artery bilaterally. No significant plaque burden. Antegrade right vertebral artery flow. Antegrade left vertebral artery flow.  Echocardiogram 10/29/2017: Left ventricle cavity is normal in size. Mild concentric hypertrophy of the left ventricle. Normal global wall motion. Normal diastolic filling pattern. Calculated EF 69%. No significant valvular abnormality.  Insignificant pericardial effusion.  Exercise myoview stress 10/20/2017: 1. The resting electrocardiogram demonstrated normal sinus rhythm, normal resting conduction and no resting arrhythmias.  The stress electrocardiogram was normal.  Patient exercised on Bruce protocol for 7:43 minutes and achieved 8.54 METS. Stress test terminated due to mid sternal chest pain and 90% % MPHR achieved (Target HR >85%). Normal BP. 2.  Myocardial perfusion imaging is normal. Overall left ventricular systolic function was normal without regional wall motion abnormalities. The left ventricular ejection fraction was 76%.  This is a low risk study.  Recent labs: 10/11/2019: Glucose 123, BUN/Cr 15/0.86. EGFR 60. Na/K 136/4.8. Rest of the CMP normal H/H 12.8/40. MCV 91.5. Platelets 252.  09/20/2019: A1C 6.3% Chol 185, TG 168, HDL 60, LDL 96.  12/31/2018: TSH 1.710   Review of Systems  Cardiovascular: Negative for chest pain, dyspnea on exertion, leg swelling, palpitations and syncope.  Neurological: Positive for headaches.  Psychiatric/Behavioral: The patient is nervous/anxious.          Vitals:   10/21/19 1024  BP: 140/69  Pulse: 60  Resp: 14  Temp: 98.1 F (36.7 C)  SpO2: 97%     Body mass index is 22.6 kg/m. Filed Weights   10/21/19 1024  Weight: 140 lb (63.5 kg)     Objective:   Physical Exam  Constitutional: She appears well-developed and well-nourished.  Neck: No JVD present.  Cardiovascular: Normal rate, regular rhythm, normal heart sounds and intact distal pulses.  No murmur heard. Pulmonary/Chest: Effort  normal and breath sounds normal. She has no wheezes. She has no rales.  Musculoskeletal:        General: No edema.  Nursing note and vitals reviewed.       Assessment & Recommendations:   78 y/o African-American female with hypertension, diet controlled diabetes, hyperlipidemia, GERD and remote history of TIA, mild nonobstructive coronary artery disease (Cath 04/01/2018).  Hypertension: Controlled. No change made today.  Continue hydralazine 50 mg tid, Isordil 20 mg tid, spironolactone 50 mg daily, clonidine 0.1 mg bid She may take additional dose of hydralazine 50 mg on days with SBP >150 mmhg  Nonobstructive coronary artery disease: Continue risk factor modification with BP and lipid lowering therapy. Continue aspirin 81 mg daily, pitvastatin. Has not tolerated other statins in the past.   Defer management of headache to PCP.  F/u in 3 months   Manish J Patwardhan, MD [facility] Pager: [phone] Office: [phone]    

## 2019-10-18 DIAGNOSIS — I10 Essential (primary) hypertension: Secondary | ICD-10-CM | POA: Diagnosis not present

## 2019-10-21 ENCOUNTER — Ambulatory Visit: Payer: Medicare Other | Admitting: Cardiology

## 2019-10-21 ENCOUNTER — Encounter: Payer: Self-pay | Admitting: Cardiology

## 2019-10-21 ENCOUNTER — Other Ambulatory Visit: Payer: Self-pay

## 2019-10-21 VITALS — BP 140/69 | HR 60 | Temp 98.1°F | Resp 14 | Ht 66.0 in | Wt 140.0 lb

## 2019-10-21 DIAGNOSIS — I2510 Atherosclerotic heart disease of native coronary artery without angina pectoris: Secondary | ICD-10-CM | POA: Diagnosis not present

## 2019-10-21 DIAGNOSIS — I10 Essential (primary) hypertension: Secondary | ICD-10-CM

## 2019-10-26 DIAGNOSIS — H6123 Impacted cerumen, bilateral: Secondary | ICD-10-CM | POA: Insufficient documentation

## 2019-10-26 DIAGNOSIS — H9113 Presbycusis, bilateral: Secondary | ICD-10-CM | POA: Diagnosis not present

## 2019-10-26 DIAGNOSIS — H938X2 Other specified disorders of left ear: Secondary | ICD-10-CM | POA: Diagnosis not present

## 2019-10-26 DIAGNOSIS — H6121 Impacted cerumen, right ear: Secondary | ICD-10-CM | POA: Diagnosis not present

## 2019-10-27 DIAGNOSIS — I10 Essential (primary) hypertension: Secondary | ICD-10-CM | POA: Diagnosis not present

## 2019-11-05 DIAGNOSIS — I10 Essential (primary) hypertension: Secondary | ICD-10-CM | POA: Diagnosis not present

## 2019-11-25 DIAGNOSIS — Z79899 Other long term (current) drug therapy: Secondary | ICD-10-CM | POA: Diagnosis not present

## 2019-11-25 DIAGNOSIS — M792 Neuralgia and neuritis, unspecified: Secondary | ICD-10-CM | POA: Diagnosis not present

## 2019-12-01 ENCOUNTER — Telehealth: Payer: Self-pay | Admitting: Podiatry

## 2019-12-01 NOTE — Telephone Encounter (Signed)
Pt left message checking on status of diabetic shoes.  Returned call and per my note I was waiting on a call back to let me know when pt was to see Dr Chalmers Cater. Pt stated she left a message in may. I apologized that I did not receive it and would submit paperwork today to Dr Chalmers Cater.

## 2019-12-07 ENCOUNTER — Telehealth: Payer: Self-pay | Admitting: Dermatology

## 2019-12-07 NOTE — Telephone Encounter (Signed)
Patient left message on office voice mail requesting a sample of Clobetasol Proprianate 0.05% saying that she did not have enough money to pay for a prescription.  (Chart # [id number])

## 2019-12-08 DIAGNOSIS — I10 Essential (primary) hypertension: Secondary | ICD-10-CM | POA: Diagnosis not present

## 2019-12-08 MED ORDER — CLOBETASOL PROP EMOLLIENT BASE 0.05 % EX CREA
1.0000 | TOPICAL_CREAM | Freq: Two times a day (BID) | CUTANEOUS | 6 refills | Status: DC | PRN
Start: 2019-12-08 — End: 2021-06-18

## 2019-12-08 MED ORDER — CLOBETASOL PROP EMOLLIENT BASE 0.05 % EX CREA
1.0000 | TOPICAL_CREAM | Freq: Two times a day (BID) | CUTANEOUS | 6 refills | Status: DC | PRN
Start: 2019-12-08 — End: 2019-12-08

## 2019-12-08 NOTE — Telephone Encounter (Signed)
Phone call to patient to inform her that we don't have any samples of the Clobetasol Cream.  I informed patient I could send in a prescription to the [facility] for her and she can pay cash pay with [facility] for no more than $25.00 per [employer].  Patient would like to have the prescription sent into the [facility] on [address].

## 2019-12-14 ENCOUNTER — Other Ambulatory Visit: Payer: Self-pay | Admitting: Internal Medicine

## 2019-12-14 ENCOUNTER — Ambulatory Visit
Admission: RE | Admit: 2019-12-14 | Discharge: 2019-12-14 | Disposition: A | Discharge status: Home / Self Care | Payer: Medicare Other | Source: Ambulatory Visit | Attending: Internal Medicine | Admitting: Internal Medicine

## 2019-12-14 DIAGNOSIS — R109 Unspecified abdominal pain: Secondary | ICD-10-CM

## 2020-01-05 ENCOUNTER — Encounter: Payer: Self-pay | Admitting: Podiatry

## 2020-01-05 ENCOUNTER — Ambulatory Visit (INDEPENDENT_AMBULATORY_CARE_PROVIDER_SITE_OTHER): Payer: Medicare Other | Admitting: Orthotics

## 2020-01-05 ENCOUNTER — Ambulatory Visit (INDEPENDENT_AMBULATORY_CARE_PROVIDER_SITE_OTHER): Payer: Medicare Other | Admitting: Podiatry

## 2020-01-05 ENCOUNTER — Other Ambulatory Visit: Payer: Self-pay

## 2020-01-05 DIAGNOSIS — M79674 Pain in right toe(s): Secondary | ICD-10-CM | POA: Diagnosis not present

## 2020-01-05 DIAGNOSIS — L84 Corns and callosities: Secondary | ICD-10-CM

## 2020-01-05 DIAGNOSIS — M79675 Pain in left toe(s): Secondary | ICD-10-CM | POA: Diagnosis not present

## 2020-01-05 DIAGNOSIS — Q828 Other specified congenital malformations of skin: Secondary | ICD-10-CM | POA: Diagnosis not present

## 2020-01-05 DIAGNOSIS — E1142 Type 2 diabetes mellitus with diabetic polyneuropathy: Secondary | ICD-10-CM

## 2020-01-05 DIAGNOSIS — B351 Tinea unguium: Secondary | ICD-10-CM

## 2020-01-08 DIAGNOSIS — I10 Essential (primary) hypertension: Secondary | ICD-10-CM | POA: Diagnosis not present

## 2020-01-08 NOTE — Progress Notes (Signed)
Subjective: Sara Brown presents today for follow up of preventative diabetic foot care, painful mycotic nails b/l that are difficult to trim. Pain interferes with ambulation. Aggravating factors include wearing enclosed shoe gear. Pain is relieved with periodic professional debridement and painful plantar lesions b/l feet.  Pain prevent comfortable ambulation. Aggravating factor is weightbearing with or without shoegear.   She is also picking up her diabetic shoes on today's visit.  Allergies  Allergen Reactions   Naproxen Other (See Comments)    Patient preference    Prednisone Other (See Comments)    Patient Preference    Amlodipine Hives   Losartan Rash     Objective: There were no vitals filed for this visit.  Pt is a pleasant 79 y.o. year old AA female WD, WN in NAD. AAO x 3.   Vascular Examination:  Capillary refill time to digits immediate b/l. Palpable DP pulses b/l. Palpable PT pulses b/l. Pedal hair absent b/l Skin temperature gradient within normal limits b/l. No edema noted b/l. No pain with calf compression b/l.  Dermatological Examination: Pedal skin with normal turgor, texture and tone bilaterally. No open wounds bilaterally. No interdigital macerations bilaterally. Toenails 1-5 b/l elongated, dystrophic, thickened, crumbly with subungual debris and tenderness to dorsal palpation. Hyperkeratotic lesion(s) submet head 1 right foot.  No erythema, no edema, no drainage, no flocculence. Porokeratotic lesion(s) plantarlateral arch left foot. No erythema, no edema, no drainage, no flocculence.  Musculoskeletal: Normal muscle strength 5/5 to all lower extremity muscle groups bilaterally. No pain crepitus or joint limitation noted with ROM b/l. Hammertoes noted to the 2-5 bilaterally.  Neurological: Protective sensation diminished with 10g monofilament b/l. Vibratory sensation intact b/l.  Assessment: 1. Pain due to onychomycosis of toenails of both feet   2.  Porokeratosis   3. Callus   4. Diabetic peripheral neuropathy associated with type 2 diabetes mellitus ([place])    Plan: -No new findings. No new orders. -Continue diabetic foot care principles. Literature dispensed on today.  -Toenails 1-5 b/l were debrided in length and girth with sterile nail nippers and dremel without iatrogenic bleeding.  -Callus(es) submet head 1 right foot pared utilizing sterile scalpel blade without complication or incident. Total number debrided =1. -Painful porokeratotic lesion(s) plantarlateral arch left foot pared and enucleated with sterile scalpel blade without incident. Total number debrided=1. -Patient to continue soft, supportive shoe gear daily. -Patient to report any pedal injuries to medical professional immediately. -Patient/POA to call should there be question/concern in the interim.  Return in about 3 months (around 04/06/2020).

## 2020-01-14 ENCOUNTER — Ambulatory Visit: Payer: Medicare Other | Admitting: Orthotics

## 2020-01-14 ENCOUNTER — Other Ambulatory Visit: Payer: Self-pay

## 2020-01-14 DIAGNOSIS — M2041 Other hammer toe(s) (acquired), right foot: Secondary | ICD-10-CM

## 2020-01-14 DIAGNOSIS — M79674 Pain in right toe(s): Secondary | ICD-10-CM

## 2020-01-14 DIAGNOSIS — L84 Corns and callosities: Secondary | ICD-10-CM

## 2020-01-14 NOTE — Progress Notes (Signed)
Too much heel slippage in hush puppy; will exchange for NB 813 as she had gotten before;  Ordered through [employer] [id number]

## 2020-01-20 ENCOUNTER — Ambulatory Visit: Payer: Medicare Other | Admitting: Cardiology

## 2020-01-20 ENCOUNTER — Encounter: Payer: Self-pay | Admitting: Cardiology

## 2020-01-20 ENCOUNTER — Other Ambulatory Visit: Payer: Self-pay

## 2020-01-20 VITALS — BP 119/66 | HR 63 | Resp 16 | Ht 66.0 in | Wt 135.0 lb

## 2020-01-20 DIAGNOSIS — I10 Essential (primary) hypertension: Secondary | ICD-10-CM | POA: Diagnosis not present

## 2020-01-20 DIAGNOSIS — I2510 Atherosclerotic heart disease of native coronary artery without angina pectoris: Secondary | ICD-10-CM | POA: Diagnosis not present

## 2020-01-20 NOTE — Progress Notes (Signed)
Follow up visit  Subjective:   Sara Brown, female    DOB: [birth year], 79 y.o.   MRN: [id number]   Chief Complaint  Patient presents with   Hypertension   Follow-up    21 month    79 y/o African-American female with hypertension, diet controlled diabetes, hyperlipidemia, GERD and remote history of TIA, mild nonobstructive coronary artery disease (Cath 04/01/2018).  She is doing well. BP is well controlled. No complaints today.  Reviewed home monitoring log. BP avg 121/70 mmHg, HR 53/min  Current Outpatient Medications on File Prior to Visit  Medication Sig Dispense Refill   albuterol (PROVENTIL HFA;VENTOLIN HFA) 108 (90 Base) MCG/ACT inhaler Inhale 1 puff into the lungs every 4 (four) hours as needed.     alendronate (FOSAMAX) 70 MG tablet Take 1 tablet by mouth once a week.     aspirin EC 81 MG tablet Take 81 mg by mouth daily.     calcium citrate-vitamin D 500-400 MG-UNIT chewable tablet Chew 1 tablet by mouth 2 (two) times daily.      carboxymethylcellulose (REFRESH PLUS) 0.5 % SOLN Place 1-2 drops into both eyes 3 (three) times daily.      Cholecalciferol (VITAMIN D3) 10 MCG (400 UNIT) CAPS Take by mouth daily. 4 daily     clobetasol cream (TEMOVATE) 0.05 % APPLY TO AFFECTED AREA DAILY     Clobetasol Prop Emollient Base (CLOBETASOL PROPIONATE E) 0.05 % emollient cream Apply 1 application topically 2 (two) times daily as needed. 60 g 6   cloNIDine (CATAPRES) 0.1 MG tablet Take 0.1 mg by mouth at bedtime and may repeat dose one time if needed.   3   Coenzyme Q10 (COQ10) 200 MG CAPS Take 200 mg by mouth as needed.      cycloSPORINE (RESTASIS) 0.05 % ophthalmic emulsion Place 1 drop into both eyes 2 (two) times daily.      Emollient (CERAVE EX) Apply 1 application topically See admin instructions. Apply to affected areas daily as directed or as needed for dryness     fluticasone (FLONASE) 50 MCG/ACT nasal spray Place 1 spray into both nostrils at bedtime  as needed for allergies or rhinitis.     Garlic 3704 MG CAPS Take 1 capsule by mouth daily.     hydrALAZINE (APRESOLINE) 50 MG tablet Take 2 tablets (100 mg total) by mouth 3 (three) times daily. (Patient taking differently: Take 50 mg by mouth 3 (three) times daily. ) 360 tablet 3   isosorbide dinitrate (ISORDIL) 20 MG tablet TAKE 1 TABLET BY MOUTH THREE TIMES A DAY 90 tablet 5   Magnesium 250 MG TABS Take 1 tablet by mouth daily.     Multiple Vitamins-Minerals (MULTIVITAMIN WITH MINERALS) tablet Take 1 tablet by mouth daily. Centrum for Women     omeprazole (PRILOSEC OTC) 20 MG tablet Take 20 mg by mouth daily as needed (for reflux).      Pitavastatin Calcium (LIVALO) 1 MG TABS Take 1 mg by mouth daily.     polyethylene glycol (MIRALAX / GLYCOLAX) packet Take 17 g by mouth at bedtime.      pyridOXINE (VITAMIN B-6) 100 MG tablet Take 100 mg by mouth daily.      spironolactone (ALDACTONE) 50 MG tablet Take 50 mg by mouth daily.      triamcinolone cream (KENALOG) 0.1 % Apply 1 application topically 2 (two) times daily as needed (rash/ eczema).   2   No current facility-administered medications on file prior to visit.  Cardiovascular & other pertient studies:  EKG 01/20/2020: Sinus rhythm 53 bpm Left atrial enlargement.  Left anterior fascicular block Low voltage in precordial leads.   Coronary angiogram 04/01/2018: LM: Normal LAD: Prox-mid 40% stenosis. iFR 0.99 (Physiologically nonsignificant) LCx: Dominant. Small branches with mild disease RCA: Nondominant. Normal   LVEDP mildly elevated.   Carotid artery duplex 02/23/2018: No hemodynamically significant arterial disease in the internal carotid artery bilaterally. No significant plaque burden. Antegrade right vertebral artery flow. Antegrade left vertebral artery flow.  Echocardiogram 10/29/2017: Left ventricle cavity is normal in size. Mild concentric hypertrophy of the left ventricle. Normal global wall motion.  Normal diastolic filling pattern. Calculated EF 69%. No significant valvular abnormality.  Insignificant pericardial effusion.  Exercise myoview stress 10/20/2017: 1. The resting electrocardiogram demonstrated normal sinus rhythm, normal resting conduction and no resting arrhythmias.  The stress electrocardiogram was normal.  Patient exercised on Bruce protocol for 7:43 minutes and achieved 8.54 METS. Stress test terminated due to mid sternal chest pain and 90% % MPHR achieved (Target HR >85%). Normal BP. 2.  Myocardial perfusion imaging is normal. Overall left ventricular systolic function was normal without regional wall motion abnormalities. The left ventricular ejection fraction was 76%.  This is a low risk study.  Recent labs: 10/11/2019: Glucose 123, BUN/Cr 15/0.86. EGFR 60. Na/K 136/4.8. Rest of the CMP normal H/H 12.8/40. MCV 91.5. Platelets 252.  09/20/2019: A1C 6.3% Chol 185, TG 168, HDL 60, LDL 96.  12/31/2018: TSH 1.710   Review of Systems  Cardiovascular: Negative for chest pain, dyspnea on exertion, leg swelling, palpitations and syncope.  Neurological: Positive for headaches.  Psychiatric/Behavioral: The patient is nervous/anxious.          Vitals:   01/20/20 1017  BP: 119/66  Pulse: 63  Resp: 16  SpO2: 98%     Body mass index is 21.79 kg/m. Filed Weights   01/20/20 1017  Weight: 135 lb (61.2 kg)     Objective:   Physical Exam Vitals and nursing note reviewed.  Constitutional:      Appearance: She is well-developed.  Neck:     Vascular: No JVD.  Cardiovascular:     Rate and Rhythm: Normal rate and regular rhythm.     Pulses: Intact distal pulses.     Heart sounds: Normal heart sounds. No murmur heard.   Pulmonary:     Effort: Pulmonary effort is normal.     Breath sounds: Normal breath sounds. No wheezing or rales.         Assessment & Recommendations:   79 y/o African-American female with hypertension, diet controlled diabetes,  hyperlipidemia, GERD and remote history of TIA, mild nonobstructive coronary artery disease (Cath 04/01/2018).  Hypertension: Controlled. No change made today.  Continue hydralazine 50 mg tid, Isordil 20 mg tid, spironolactone 50 mg daily, clonidine 0.1 mg bid She may take additional dose of hydralazine 50 mg on days with SBP >150 mmhg  Nonobstructive coronary artery disease: Continue risk factor modification with BP and lipid lowering therapy. Continue aspirin 81 mg daily, pitvastatin. Has not tolerated other statins in the past.    F/u in 3 months   Leland, MD [facility] Pager: [phone] Office: [phone]

## 2020-02-08 DIAGNOSIS — I10 Essential (primary) hypertension: Secondary | ICD-10-CM | POA: Diagnosis not present

## 2020-02-15 ENCOUNTER — Other Ambulatory Visit (HOSPITAL_COMMUNITY): Payer: Self-pay | Admitting: Internal Medicine

## 2020-02-15 ENCOUNTER — Other Ambulatory Visit: Payer: Self-pay

## 2020-02-15 ENCOUNTER — Ambulatory Visit (HOSPITAL_COMMUNITY)
Admission: RE | Admit: 2020-02-15 | Discharge: 2020-02-15 | Disposition: A | Discharge status: Home / Self Care | Payer: Medicare Other | Source: Ambulatory Visit | Attending: Internal Medicine | Admitting: Internal Medicine

## 2020-02-15 DIAGNOSIS — R600 Localized edema: Secondary | ICD-10-CM | POA: Diagnosis not present

## 2020-02-15 DIAGNOSIS — M7989 Other specified soft tissue disorders: Secondary | ICD-10-CM | POA: Diagnosis not present

## 2020-02-15 DIAGNOSIS — M79605 Pain in left leg: Secondary | ICD-10-CM

## 2020-02-15 DIAGNOSIS — R42 Dizziness and giddiness: Secondary | ICD-10-CM | POA: Diagnosis not present

## 2020-02-15 DIAGNOSIS — R0602 Shortness of breath: Secondary | ICD-10-CM | POA: Diagnosis not present

## 2020-03-02 DIAGNOSIS — Z23 Encounter for immunization: Secondary | ICD-10-CM | POA: Diagnosis not present

## 2020-03-09 DIAGNOSIS — I10 Essential (primary) hypertension: Secondary | ICD-10-CM | POA: Diagnosis not present

## 2020-03-23 DIAGNOSIS — H40023 Open angle with borderline findings, high risk, bilateral: Secondary | ICD-10-CM | POA: Diagnosis not present

## 2020-03-23 DIAGNOSIS — H04123 Dry eye syndrome of bilateral lacrimal glands: Secondary | ICD-10-CM | POA: Diagnosis not present

## 2020-03-23 DIAGNOSIS — H16223 Keratoconjunctivitis sicca, not specified as Sjogren's, bilateral: Secondary | ICD-10-CM | POA: Diagnosis not present

## 2020-03-23 DIAGNOSIS — H02831 Dermatochalasis of right upper eyelid: Secondary | ICD-10-CM | POA: Diagnosis not present

## 2020-03-26 ENCOUNTER — Other Ambulatory Visit: Payer: Self-pay | Admitting: Cardiology

## 2020-04-04 ENCOUNTER — Ambulatory Visit
Admission: RE | Admit: 2020-04-04 | Discharge: 2020-04-04 | Disposition: A | Discharge status: Home / Self Care | Payer: Medicare Other | Source: Ambulatory Visit | Attending: Internal Medicine | Admitting: Internal Medicine

## 2020-04-04 ENCOUNTER — Other Ambulatory Visit: Payer: Self-pay | Admitting: Internal Medicine

## 2020-04-04 DIAGNOSIS — M545 Low back pain, unspecified: Secondary | ICD-10-CM

## 2020-04-06 ENCOUNTER — Ambulatory Visit: Payer: PRIVATE HEALTH INSURANCE | Admitting: Podiatry

## 2020-04-07 DIAGNOSIS — M79606 Pain in leg, unspecified: Secondary | ICD-10-CM | POA: Diagnosis not present

## 2020-04-09 DIAGNOSIS — I10 Essential (primary) hypertension: Secondary | ICD-10-CM | POA: Diagnosis not present

## 2020-04-18 ENCOUNTER — Other Ambulatory Visit: Payer: Self-pay

## 2020-04-18 ENCOUNTER — Ambulatory Visit (INDEPENDENT_AMBULATORY_CARE_PROVIDER_SITE_OTHER): Payer: Medicare Other | Admitting: Podiatry

## 2020-04-18 VITALS — BP 138/74 | HR 92 | Temp 98.2°F | Resp 18

## 2020-04-18 DIAGNOSIS — Q828 Other specified congenital malformations of skin: Secondary | ICD-10-CM

## 2020-04-18 DIAGNOSIS — E1142 Type 2 diabetes mellitus with diabetic polyneuropathy: Secondary | ICD-10-CM

## 2020-04-18 DIAGNOSIS — M79675 Pain in left toe(s): Secondary | ICD-10-CM | POA: Diagnosis not present

## 2020-04-18 DIAGNOSIS — L84 Corns and callosities: Secondary | ICD-10-CM | POA: Diagnosis not present

## 2020-04-18 DIAGNOSIS — M2042 Other hammer toe(s) (acquired), left foot: Secondary | ICD-10-CM

## 2020-04-18 DIAGNOSIS — B351 Tinea unguium: Secondary | ICD-10-CM

## 2020-04-18 DIAGNOSIS — M2041 Other hammer toe(s) (acquired), right foot: Secondary | ICD-10-CM

## 2020-04-18 DIAGNOSIS — M79674 Pain in right toe(s): Secondary | ICD-10-CM | POA: Diagnosis not present

## 2020-04-20 NOTE — Progress Notes (Signed)
Follow up visit  Subjective:   Sara Brown, female    DOB: Apr 08, 1941, 79 y.o.   MRN: [id number]   Chief Complaint  Patient presents with  . Hypertension    79 y/o African-American female with hypertension, diet controlled diabetes, hyperlipidemia, GERD and remote history of TIA, mild nonobstructive coronary artery disease (Cath 04/01/2018).  Reviewed home monitoring log, which shows well controlled blood pressures. Blood pressure is elevated in the office today. When her blood pressure is elevated, she has tingling on the left and center of her tongue. She denies any any other tingling, numbness in any other part of the body. She also denies any weakness, vision changes, slurred speech, facial droop.   Current Outpatient Medications on File Prior to Visit  Medication Sig Dispense Refill  . albuterol (PROVENTIL HFA;VENTOLIN HFA) 108 (90 Base) MCG/ACT inhaler Inhale 1 puff into the lungs every 4 (four) hours as needed.    Marland Kitchen alendronate (FOSAMAX) 70 MG tablet Take 1 tablet by mouth once a week.    Marland Kitchen aspirin EC 81 MG tablet Take 81 mg by mouth daily.    . calcium citrate-vitamin D 500-400 MG-UNIT chewable tablet Chew 1 tablet by mouth 2 (two) times daily.     . carboxymethylcellulose (REFRESH PLUS) 0.5 % SOLN Place 1-2 drops into both eyes 3 (three) times daily.     . Cholecalciferol (VITAMIN D3) 10 MCG (400 UNIT) CAPS Take by mouth daily. 4 daily    . clobetasol cream (TEMOVATE) 0.05 % APPLY TO AFFECTED AREA DAILY    . Clobetasol Prop Emollient Base (CLOBETASOL PROPIONATE E) 0.05 % emollient cream Apply 1 application topically 2 (two) times daily as needed. 60 g 6  . cloNIDine (CATAPRES) 0.1 MG tablet Take 0.1 mg by mouth at bedtime and may repeat dose one time if needed.   3  . Coenzyme Q10 (COQ10) 200 MG CAPS Take 200 mg by mouth as needed.     . cycloSPORINE (RESTASIS) 0.05 % ophthalmic emulsion Place 1 drop into both eyes 2 (two) times daily.     . Emollient (CERAVE EX)  Apply 1 application topically See admin instructions. Apply to affected areas daily as directed or as needed for dryness    . fluticasone (FLONASE) 50 MCG/ACT nasal spray Place 1 spray into both nostrils at bedtime as needed for allergies or rhinitis.    . Garlic 0867 MG CAPS Take 1 capsule by mouth daily.    . hydrALAZINE (APRESOLINE) 50 MG tablet Take 2 tablets (100 mg total) by mouth 3 (three) times daily. (Patient taking differently: Take 50 mg by mouth 3 (three) times daily. ) 360 tablet 3  . isosorbide dinitrate (ISORDIL) 20 MG tablet TAKE 1 TABLET BY MOUTH THREE TIMES A DAY 270 tablet 1  . Magnesium 250 MG TABS Take 1 tablet by mouth daily.    . Multiple Vitamins-Minerals (MULTIVITAMIN WITH MINERALS) tablet Take 1 tablet by mouth daily. Centrum for Women    . omeprazole (PRILOSEC OTC) 20 MG tablet Take 20 mg by mouth daily as needed (for reflux).     . Pitavastatin Calcium (LIVALO) 1 MG TABS Take 1 mg by mouth daily.    . polyethylene glycol (MIRALAX / GLYCOLAX) packet Take 17 g by mouth at bedtime.     . pyridOXINE (VITAMIN B-6) 100 MG tablet Take 100 mg by mouth daily.     Marland Kitchen spironolactone (ALDACTONE) 50 MG tablet Take 50 mg by mouth daily.     Marland Kitchen  triamcinolone cream (KENALOG) 0.1 % Apply 1 application topically 2 (two) times daily as needed (rash/ eczema).   2   No current facility-administered medications on file prior to visit.    Cardiovascular & other pertient studies:  EKG 01/20/2020: Sinus rhythm 53 bpm Left atrial enlargement.  Left anterior fascicular block Low voltage in precordial leads.   Coronary angiogram 04/01/2018: LM: Normal LAD: Prox-mid 40% stenosis. iFR 0.99 (Physiologically nonsignificant) LCx: Dominant. Small branches with mild disease RCA: Nondominant. Normal   LVEDP mildly elevated.   Carotid artery duplex 02/23/2018: No hemodynamically significant arterial disease in the internal carotid artery bilaterally. No significant plaque burden. Antegrade  right vertebral artery flow. Antegrade left vertebral artery flow.  Echocardiogram 10/29/2017: Left ventricle cavity is normal in size. Mild concentric hypertrophy of the left ventricle. Normal global wall motion. Normal diastolic filling pattern. Calculated EF 69%. No significant valvular abnormality.  Insignificant pericardial effusion.  Exercise myoview stress 10/20/2017: 1. The resting electrocardiogram demonstrated normal sinus rhythm, normal resting conduction and no resting arrhythmias.  The stress electrocardiogram was normal.  Patient exercised on Bruce protocol for 7:43 minutes and achieved 8.54 METS. Stress test terminated due to mid sternal chest pain and 90% % MPHR achieved (Target HR >85%). Normal BP. 2.  Myocardial perfusion imaging is normal. Overall left ventricular systolic function was normal without regional wall motion abnormalities. The left ventricular ejection fraction was 76%.  This is a low risk study.  Recent labs: 10/11/2019: Glucose 123, BUN/Cr 15/0.86. EGFR 60. Na/K 136/4.8. Rest of the CMP normal H/H 12.8/40. MCV 91.5. Platelets 252.  09/20/2019: A1C 6.3% Chol 185, TG 168, HDL 60, LDL 96.  12/31/2018: TSH 1.710   Review of Systems  Cardiovascular: Negative for chest pain, dyspnea on exertion, leg swelling, palpitations and syncope.  Neurological: Positive for headaches.  Psychiatric/Behavioral: The patient is nervous/anxious.          Vitals:   04/21/20 0932  BP: (!) 147/78  Pulse: 63  Resp: 17  SpO2: 94%     Body mass index is 21.95 kg/m. Filed Weights   04/21/20 0932  Weight: 136 lb (61.7 kg)     Objective:   Physical Exam Vitals and nursing note reviewed.  Constitutional:      Appearance: She is well-developed.  Neck:     Vascular: No JVD.  Cardiovascular:     Rate and Rhythm: Normal rate and regular rhythm.     Pulses: Intact distal pulses.     Heart sounds: Normal heart sounds. No murmur heard.   Pulmonary:     Effort:  Pulmonary effort is normal.     Breath sounds: Normal breath sounds. No wheezing or rales.         Assessment & Recommendations:   79 y/o African-American female with hypertension, diet controlled diabetes, hyperlipidemia, GERD and remote history of TIA, mild nonobstructive coronary artery disease (Cath 04/01/2018).  Hypertension: Generally well controlled. If SBP does not come down below 140 mmHg, she will take an additional dose of hydralazine 50 mg this morning. Continue hydralazine 50 mg tid, Isordil 20 mg tid, spironolactone 50 mg daily, clonidine 0.1 mg bid Check labs today.  Coronary artery disease involving native coronary artery of native heart without angina pectoris Continue risk factor modification with BP and lipid lowering therapy. Continue aspirin 81 mg daily, pitvastatin. She has not tolerated other statins in the past.   F/u in 3 months   Kayanna Mckillop Esther Hardy, MD [facility] Pager: [phone] Office: [phone]

## 2020-04-21 ENCOUNTER — Ambulatory Visit: Payer: Medicare Other | Admitting: Cardiology

## 2020-04-21 ENCOUNTER — Encounter: Payer: Self-pay | Admitting: Cardiology

## 2020-04-21 ENCOUNTER — Other Ambulatory Visit: Payer: Self-pay

## 2020-04-21 VITALS — BP 147/78 | HR 63 | Resp 17 | Ht 66.0 in | Wt 136.0 lb

## 2020-04-21 DIAGNOSIS — I2510 Atherosclerotic heart disease of native coronary artery without angina pectoris: Secondary | ICD-10-CM | POA: Diagnosis not present

## 2020-04-21 DIAGNOSIS — I10 Essential (primary) hypertension: Secondary | ICD-10-CM

## 2020-04-22 ENCOUNTER — Encounter: Payer: Self-pay | Admitting: Podiatry

## 2020-04-22 NOTE — Progress Notes (Signed)
Subjective: Sara Brown presents today for follow up of preventative diabetic foot care, painful mycotic nails b/l that are difficult to trim. Pain interferes with ambulation. Aggravating factors include wearing enclosed shoe gear. Pain is relieved with periodic professional debridement and painful plantar lesions b/l feet.  Pain prevent comfortable ambulation. Aggravating factor is weightbearing with or without shoegear.   She states she is not feeling well in clinic today. She thinks it's her blood pressure. She takes a pill in the afternoon.  Allergies  Allergen Reactions  . Naproxen Other (See Comments)    Patient preference   . Prednisone Other (See Comments)    Patient Preference   . Amlodipine Hives  . Losartan Rash     Objective: Vitals:   04/18/20 1526  BP: 138/74  Pulse: 92  Resp: 18  Temp: 98.2 F (36.8 C)    Pt is a pleasant 79 y.o. year old AA female WD, WN in NAD. AAO x 3.   Vascular Examination:  Capillary refill time to digits immediate b/l. Palpable DP pulses b/l. Palpable PT pulses b/l. Pedal hair absent b/l Skin temperature gradient within normal limits b/l. No edema noted b/l. No pain with calf compression b/l.  Dermatological Examination: Pedal skin with normal turgor, texture and tone bilaterally. No open wounds bilaterally. No interdigital macerations bilaterally. Toenails 1-5 b/l elongated, dystrophic, thickened, crumbly with subungual debris and tenderness to dorsal palpation. Hyperkeratotic lesion(s) submet head 1 right foot.  No erythema, no edema, no drainage, no flocculence. Porokeratotic lesion(s) plantarlateral arch left foot. No erythema, no edema, no drainage, no flocculence.  Musculoskeletal: Normal muscle strength 5/5 to all lower extremity muscle groups bilaterally. No pain crepitus or joint limitation noted with ROM b/l. Hammertoes noted to the 2-5 bilaterally.  Neurological: Protective sensation diminished with 10g monofilament  b/l. Vibratory sensation intact b/l.  Assessment: 1. Pain due to onychomycosis of toenails of both feet   2. Callus   3. Hammer toes of both feet   4. Porokeratosis   5. Diabetic peripheral neuropathy associated with type 2 diabetes mellitus ([place])    Plan: -We did take her vitals today. -No new findings. No new orders. -Continue diabetic foot care principles. Literature dispensed on today.  -Toenails 1-5 b/l were debrided in length and girth with sterile nail nippers and dremel without iatrogenic bleeding.  -Callus(es) submet head 1 right foot pared utilizing sterile scalpel blade without complication or incident. Total number debrided =1. -Painful porokeratotic lesion(s) plantarlateral arch left foot pared and enucleated with sterile scalpel blade without incident. Total number debrided=1. -Patient to continue soft, supportive shoe gear daily. -Patient to report any pedal injuries to medical professional immediately. -Patient/POA to call should there be question/concern in the interim.  Return in about 3 months (around 07/19/2020).

## 2020-05-09 DIAGNOSIS — I10 Essential (primary) hypertension: Secondary | ICD-10-CM | POA: Diagnosis not present

## 2020-05-15 DIAGNOSIS — J45901 Unspecified asthma with (acute) exacerbation: Secondary | ICD-10-CM | POA: Diagnosis not present

## 2020-05-26 ENCOUNTER — Emergency Department (HOSPITAL_COMMUNITY): Payer: Medicare Other

## 2020-05-26 ENCOUNTER — Emergency Department (HOSPITAL_COMMUNITY)
Admission: EM | Admit: 2020-05-26 | Discharge: 2020-05-27 | Disposition: A | Discharge status: Home / Self Care | Payer: Medicare Other | Attending: Emergency Medicine | Admitting: Emergency Medicine

## 2020-05-26 ENCOUNTER — Other Ambulatory Visit: Payer: Self-pay

## 2020-05-26 ENCOUNTER — Telehealth: Payer: Self-pay

## 2020-05-26 DIAGNOSIS — I10 Essential (primary) hypertension: Secondary | ICD-10-CM | POA: Insufficient documentation

## 2020-05-26 DIAGNOSIS — Z8673 Personal history of transient ischemic attack (TIA), and cerebral infarction without residual deficits: Secondary | ICD-10-CM | POA: Insufficient documentation

## 2020-05-26 DIAGNOSIS — R200 Anesthesia of skin: Secondary | ICD-10-CM | POA: Diagnosis not present

## 2020-05-26 DIAGNOSIS — R001 Bradycardia, unspecified: Secondary | ICD-10-CM | POA: Diagnosis not present

## 2020-05-26 DIAGNOSIS — Z79899 Other long term (current) drug therapy: Secondary | ICD-10-CM | POA: Insufficient documentation

## 2020-05-26 DIAGNOSIS — G319 Degenerative disease of nervous system, unspecified: Secondary | ICD-10-CM | POA: Insufficient documentation

## 2020-05-26 DIAGNOSIS — I6529 Occlusion and stenosis of unspecified carotid artery: Secondary | ICD-10-CM | POA: Diagnosis not present

## 2020-05-26 DIAGNOSIS — Z7982 Long term (current) use of aspirin: Secondary | ICD-10-CM | POA: Insufficient documentation

## 2020-05-26 DIAGNOSIS — E119 Type 2 diabetes mellitus without complications: Secondary | ICD-10-CM | POA: Diagnosis not present

## 2020-05-26 DIAGNOSIS — J45909 Unspecified asthma, uncomplicated: Secondary | ICD-10-CM | POA: Diagnosis not present

## 2020-05-26 DIAGNOSIS — I6782 Cerebral ischemia: Secondary | ICD-10-CM | POA: Diagnosis not present

## 2020-05-26 LAB — COMPREHENSIVE METABOLIC PANEL
ALT: 26 U/L (ref 0–44)
AST: 25 U/L (ref 15–41)
Albumin: 3.9 g/dL (ref 3.5–5.0)
Alkaline Phosphatase: 43 U/L (ref 38–126)
Anion gap: 12 (ref 5–15)
BUN: 16 mg/dL (ref 8–23)
CO2: 24 mmol/L (ref 22–32)
Calcium: 10 mg/dL (ref 8.9–10.3)
Chloride: 103 mmol/L (ref 98–111)
Creatinine, Ser: 1.19 mg/dL — ABNORMAL HIGH (ref 0.44–1.00)
GFR, Estimated: 46 mL/min — ABNORMAL LOW (ref >60)
Glucose, Bld: 115 mg/dL — ABNORMAL HIGH (ref 70–99)
Potassium: 4 mmol/L (ref 3.5–5.1)
Sodium: 139 mmol/L (ref 135–145)
Total Bilirubin: 0.8 mg/dL (ref 0.3–1.2)
Total Protein: 7.3 g/dL (ref 6.5–8.1)

## 2020-05-26 LAB — I-STAT CHEM 8, ED
BUN: 19 mg/dL (ref 8–23)
Calcium, Ion: 1.31 mmol/L (ref 1.15–1.40)
Chloride: 104 mmol/L (ref 98–111)
Creatinine, Ser: 1.2 mg/dL — ABNORMAL HIGH (ref 0.44–1.00)
Glucose, Bld: 112 mg/dL — ABNORMAL HIGH (ref 70–99)
HCT: 38 % (ref 36.0–46.0)
Hemoglobin: 12.9 g/dL (ref 12.0–15.0)
Potassium: 4 mmol/L (ref 3.5–5.1)
Sodium: 139 mmol/L (ref 135–145)
TCO2: 24 mmol/L (ref 22–32)

## 2020-05-26 LAB — CBC
HCT: 36.5 % (ref 36.0–46.0)
Hemoglobin: 12.2 g/dL (ref 12.0–15.0)
MCH: 29.6 pg (ref 26.0–34.0)
MCHC: 33.4 g/dL (ref 30.0–36.0)
MCV: 88.6 fL (ref 80.0–100.0)
Platelets: 237 10*3/uL (ref 150–400)
RBC: 4.12 MIL/uL (ref 3.87–5.11)
RDW: 13.9 % (ref 11.5–15.5)
WBC: 6.1 10*3/uL (ref 4.0–10.5)
nRBC: 0 % (ref 0.0–0.2)

## 2020-05-26 LAB — DIFFERENTIAL
Abs Immature Granulocytes: 0.02 10*3/uL (ref 0.00–0.07)
Basophils Absolute: 0 10*3/uL (ref 0.0–0.1)
Basophils Relative: 1 %
Eosinophils Absolute: 0.1 10*3/uL (ref 0.0–0.5)
Eosinophils Relative: 2 %
Immature Granulocytes: 0 %
Lymphocytes Relative: 21 %
Lymphs Abs: 1.3 10*3/uL (ref 0.7–4.0)
Monocytes Absolute: 0.6 10*3/uL (ref 0.1–1.0)
Monocytes Relative: 9 %
Neutro Abs: 4 10*3/uL (ref 1.7–7.7)
Neutrophils Relative %: 67 %

## 2020-05-26 LAB — APTT: aPTT: 30 seconds (ref 24–36)

## 2020-05-26 LAB — PROTIME-INR
INR: 1.1 (ref 0.8–1.2)
Prothrombin Time: 13.9 seconds (ref 11.4–15.2)

## 2020-05-26 MED ORDER — SODIUM CHLORIDE 0.9% FLUSH
3.0000 mL | Freq: Once | INTRAVENOUS | Status: AC
Start: 2020-05-26 — End: 2020-05-27
  Administered 2020-05-27: 3 mL via INTRAVENOUS

## 2020-05-26 NOTE — Telephone Encounter (Signed)
Patient called complaining of strange feeling on tongue. States she had numbness on right side of tongue and that her neck hurts. Pt said her bp was 145/80. She drank garlic water to help her BP go down. Patient plans to take 4:00 medication (hydralaine and isosorbide dinitrate) and if symptoms persists after that then she will go to the hospital. AD/S

## 2020-05-26 NOTE — ED Triage Notes (Signed)
Pt presents to ED POV. Pt c/o "funny feeling" around face that began around 0600. Pt reports that she was told by [facility] to come here to be worked up for stroke if symptoms persist. [facility] - 0.

## 2020-05-27 DIAGNOSIS — I10 Essential (primary) hypertension: Secondary | ICD-10-CM | POA: Diagnosis not present

## 2020-05-27 LAB — URINALYSIS, ROUTINE W REFLEX MICROSCOPIC
Bacteria, UA: NONE SEEN
Bilirubin Urine: NEGATIVE
Glucose, UA: NEGATIVE mg/dL
Hgb urine dipstick: NEGATIVE
Ketones, ur: NEGATIVE mg/dL
Leukocytes,Ua: NEGATIVE
Nitrite: NEGATIVE
Protein, ur: NEGATIVE mg/dL
Specific Gravity, Urine: 1.005 (ref 1.005–1.030)
pH: 6 (ref 5.0–8.0)

## 2020-05-27 LAB — TROPONIN I (HIGH SENSITIVITY): Troponin I (High Sensitivity): 3 ng/L (ref <18)

## 2020-05-27 MED ORDER — ISOSORBIDE DINITRATE 20 MG PO TABS
20.0000 mg | ORAL_TABLET | Freq: Once | ORAL | Status: AC
  Administered 2020-05-27: 04:00:00 20 mg via ORAL
  Filled 2020-05-27: qty 1

## 2020-05-27 MED ORDER — HYDRALAZINE HCL 25 MG PO TABS
100.0000 mg | ORAL_TABLET | Freq: Once | ORAL | Status: AC
  Administered 2020-05-27: 04:00:00 50 mg via ORAL
  Filled 2020-05-27: qty 4

## 2020-05-27 NOTE — Discharge Instructions (Addendum)
Take your blood pressure medications as prescribed.  Your labs, head CT, urine, EKG today were reassuring.

## 2020-05-27 NOTE — ED Notes (Signed)
E-signature pad unavailable at time of pt discharge. This RN discussed discharge materials with pt and answered all pt questions. Pt stated understanding of discharge material. ? ?

## 2020-05-27 NOTE — ED Provider Notes (Signed)
TIME SEEN: 3:15 AM  CHIEF COMPLAINT: Hypertension  HPI: Patient is an 79 year old female with history of hypertension, hyperlipidemia, diabetes, TIA who presents to the emergency department with not feeling well today.  She states that at 4am she woke up feeling fine.  At 6am felt abnormal and states BP elevated but wasn't sure what the reading was but states "it was too high for that early in the morning."  Took Isosorbide and hydralazine at 7:45am.  Took BP again at noon.  Felt bad at 1:25 PM and checked BP again.  At 3pm BP was 165/95 which was the highest.  Tongue felt "strange on left side", neck was felling "badly" and face on right was feeling "not right" that she describes as a tightness.  She denies numbness. Took clonidine at 8pm.  Also on spironolactone (last took yesterday morning).  She denies any chest pain or chest discomfort.  No shortness of breath.  She denies any tingling or focal weakness.  She has been to the emergency department multiple times for similar complaints.  She states she called her PCP who instructed her to come to the emergency department.   ROS: See HPI Constitutional: no fever  Eyes: no drainage  ENT: no runny nose   Cardiovascular:  no chest pain  Resp: no SOB  GI: no vomiting GU: no dysuria Integumentary: no rash  Allergy: no hives  Musculoskeletal: no leg swelling  Neurological: no slurred speech ROS otherwise negative  PAST MEDICAL HISTORY/PAST SURGICAL HISTORY:  Past Medical History:  Diagnosis Date  . Allergic rhinitis   . Arthritis    "fingers, knees" (02/09/2018)  . Asthma    "stopped taking RX after dr said I don't have this" (02/09/2018)  . Carotid stenosis    ICA(L)  . Complication of anesthesia    "felt the cut w/one of my c-sections" (02/09/2018)  . Constipation   . GERD (gastroesophageal reflux disease)   . Headache    "years ago; before menopause" (02/09/2018)  . Hyperlipidemia   . Hypertension   . Neuropathy    face (R)  . OA  (osteoarthritis)   . Osteoporosis   . TIA (transient ischemic attack) ?2005  . Type II diabetes mellitus ([place])   . Vitamin D deficiency     MEDICATIONS:  Prior to Admission medications   Medication Sig Start Date End Date Taking? Authorizing Provider  albuterol (PROVENTIL HFA;VENTOLIN HFA) 108 (90 Base) MCG/ACT inhaler Inhale 1 puff into the lungs every 4 (four) hours as needed. 07/23/18   [provider]  alendronate (FOSAMAX) 70 MG tablet Take 1 tablet by mouth once a week. 08/09/18   [provider]  aspirin EC 81 MG tablet Take 81 mg by mouth daily.    [provider]  calcium citrate-vitamin D 500-400 MG-UNIT chewable tablet Chew 1 tablet by mouth 2 (two) times daily.     [provider]  carboxymethylcellulose (REFRESH PLUS) 0.5 % SOLN Place 1-2 drops into both eyes 3 (three) times daily.     [provider]  Cholecalciferol (VITAMIN D3) 10 MCG (400 UNIT) CAPS Take by mouth daily. 4 daily    [provider]  clobetasol cream (TEMOVATE) 0.05 % APPLY TO AFFECTED AREA DAILY 03/17/19   [provider]  Clobetasol Prop Emollient Base (CLOBETASOL PROPIONATE E) 0.05 % emollient cream Apply 1 application topically 2 (two) times daily as needed. 12/08/19   Lavonna Monarch, MD  cloNIDine (CATAPRES) 0.1 MG tablet Take 0.1 mg by mouth  at bedtime and may repeat dose one time if needed.  10/01/17   [provider]  Coenzyme Q10 (COQ10) 200 MG CAPS Take 200 mg by mouth as needed.     [provider]  cycloSPORINE (RESTASIS) 0.05 % ophthalmic emulsion Place 1 drop into both eyes 2 (two) times daily.     [provider]  Emollient (CERAVE EX) Apply 1 application topically See admin instructions. Apply to affected areas daily as directed or as needed for dryness    [provider]  fluticasone (FLONASE) 50 MCG/ACT nasal spray Place 1 spray into both nostrils at bedtime as needed for allergies or rhinitis.     [provider]  Garlic 8182 MG CAPS Take 1 capsule by mouth daily.    [provider]  hydrALAZINE (APRESOLINE) 50 MG tablet Take 2 tablets (100 mg total) by mouth 3 (three) times daily. Patient taking differently: Take 50 mg by mouth 3 (three) times daily.  09/10/19 04/21/20  Patwardhan, Reynold Bowen, MD  isosorbide dinitrate (ISORDIL) 20 MG tablet TAKE 1 TABLET BY MOUTH THREE TIMES A DAY 03/27/20   Patwardhan, Manish J, MD  Magnesium 250 MG TABS Take 1 tablet by mouth daily.    [provider]  Multiple Vitamins-Minerals (MULTIVITAMIN WITH MINERALS) tablet Take 1 tablet by mouth daily. Centrum for Women    [provider]  omeprazole (PRILOSEC OTC) 20 MG tablet Take 20 mg by mouth daily as needed (for reflux).     [provider]  Pitavastatin Calcium (LIVALO) 1 MG TABS Take 1 mg by mouth daily.    [provider]  polyethylene glycol (MIRALAX / GLYCOLAX) packet Take 17 g by mouth at bedtime.     [provider]  pyridOXINE (VITAMIN B-6) 100 MG tablet Take 100 mg by mouth daily.     [provider]  spironolactone (ALDACTONE) 50 MG tablet Take 50 mg by mouth daily.     [provider]  triamcinolone cream (KENALOG) 0.1 % Apply 1 application topically 2 (two) times daily as needed (rash/ eczema).  11/01/14   [provider]    ALLERGIES:  Allergies  Allergen Reactions  . Naproxen Other (See Comments)    Patient preference   . Prednisone Other (See Comments)    Patient Preference   . Amlodipine Hives  . Losartan Rash    SOCIAL HISTORY:  Social History   Tobacco Use  . Smoking status: Never Smoker  . Smokeless tobacco: Never Used  Substance Use Topics  . Alcohol use: Never    FAMILY HISTORY: Family History  Problem Relation Age of Onset  . Diabetes Mellitus II Mother   . Osteoporosis Mother   . Hypertension Mother   . Diabetes Mellitus II Father   . CVA Sister        4  . CAD Sister         4  . Hypertension Sister        4  . Osteoarthritis Sister        4  . Alcoholism Brother   . Breast cancer Cousin 75    EXAM: BP (!) 156/71 (BP Location: Right Arm)   Pulse (!) 43   Temp 97.6 F (36.4 C) (Oral)   Resp 18   Ht 5\' 6"  (1.676 m)   Wt 60.3 kg   SpO2 100%   BMI 21.47 kg/m  CONSTITUTIONAL: Alert and oriented and responds appropriately to questions. Well-appearing; well-nourished HEAD: Normocephalic EYES: Conjunctivae  clear, pupils appear equal, EOM appear intact ENT: normal nose; moist mucous membranes NECK: Supple, normal ROM CARD: RRR; S1 and S2 appreciated; no murmurs, no clicks, no rubs, no gallops RESP: Normal chest excursion without splinting or tachypnea; breath sounds clear and equal bilaterally; no wheezes, no rhonchi, no rales, no hypoxia or respiratory distress, speaking full sentences ABD/GI: Normal bowel sounds; non-distended; soft, non-tender, no rebound, no guarding, no peritoneal signs, no hepatosplenomegaly BACK:  The back appears normal EXT: Normal ROM in all joints; no deformity noted, no edema; no cyanosis SKIN: Normal color for age and race; warm; no rash on exposed skin NEURO: Moves all extremities equally, cranial nerves II through XII intact, no pronator drift, sensation to light touch intact diffusely PSYCH: The patient's mood and manner are appropriate.   MEDICAL DECISION MAKING: Patient here with very vague, atypical symptoms.  Here frequently with concerns for her blood pressure being elevated.  Currently in the 160s/90s.  Will give her doses of her home hydralazine and isosorbide and monitor her blood pressure.  She has no focal neurologic deficits and denies any numbness, tingling or focal weakness despite nursing triage notes.  Stroke work-up here is unremarkable.  She does describe having some tightness in her face and an uncomfortable feeling in her neck.  We will add on one troponin in case this is her anginal equivalent but she denies  any chest pain or shortness of breath.  Her EKG shows no ischemia.  She has had some intermittent heart rates in the 40s that she states is a chronic issue for her.  Repeat EKG shows no interval abnormalities, AV blocks, ischemia.  Will check urinalysis as well given she is elderly and complains of just not feeling right.  Anticipate that if her work-up is unremarkable today that she will be able to be discharged home.  Otherwise labs, head CT unremarkable today.  ED PROGRESS: Patient high-sensitivity troponin is 3.  I do not feel she needs repeat.  Her blood pressure is now in the 130s/70s.  Urine shows no infection or signs of proteinuria, hematuria.  She reports feeling better.  Will discharge home.   At this time, I do not feel there is any life-threatening condition present. I have reviewed, interpreted and discussed all results (EKG, imaging, lab, urine as appropriate) and exam findings with patient/family. I have reviewed nursing notes and appropriate previous records.  I feel the patient is safe to be discharged home without further emergent workup and can continue workup as an outpatient as needed. Discussed usual and customary return precautions. Patient/family verbalize understanding and are comfortable with this plan.  Outpatient follow-up has been provided as needed. All questions have been answered.    EKG Interpretation  Date/Time:  Friday May 26 2020 17:31:22 EST Ventricular Rate:  63 PR Interval:  182 QRS Duration: 82 QT Interval:  396 QTC Calculation: 405 R Axis:   -32 Text Interpretation: Normal sinus rhythm Left axis deviation Anterior infarct , age undetermined Abnormal ECG No significant change since last tracing Confirmed by Pryor Curia [phone]) on 05/27/2020 2:50:59 AM        EKG Interpretation  Date/Time:  Saturday May 27 2020 03:35:41 EST Ventricular Rate:  42 PR Interval:  182 QRS Duration: 103 QT Interval:  483 QTC Calculation: 404 R  Axis:   -5 Text Interpretation: Sinus bradycardia Consider anterior infarct Confirmed by Pryor Curia [phone]) on 05/27/2020 3:51:45 AM        Sara Brown was evaluated  in Emergency Department on 05/27/2020 for the symptoms described in the history of present illness. She was evaluated in the context of the global COVID-19 pandemic, which necessitated consideration that the patient might be at risk for infection with the SARS-CoV-2 virus that causes COVID-19. Institutional protocols and algorithms that pertain to the evaluation of patients at risk for COVID-19 are in a state of rapid change based on information released by regulatory bodies including the CDC and federal and state organizations. These policies and algorithms were followed during the patient's care in the ED.      Mckoy Bhakta, Delice Bison, DO 05/27/20 [phone]

## 2020-05-29 ENCOUNTER — Telehealth: Payer: Self-pay

## 2020-05-29 DIAGNOSIS — I10 Essential (primary) hypertension: Secondary | ICD-10-CM

## 2020-05-29 NOTE — Telephone Encounter (Signed)
Telephone encounter:  Reason for call: pt was seen in ER on Friday for her BP , she said it is still high, she also wanted you to review her labs from hospital specifically her Troponin   Usual provider: MP   Last office visit: 04/21/20 Next office visit: 07/24/20  Last hospitalization: 05/26/20  Current Outpatient Medications on File Prior to Visit  Medication Sig Dispense Refill  . albuterol (PROVENTIL HFA;VENTOLIN HFA) 108 (90 Base) MCG/ACT inhaler Inhale 1 puff into the lungs every 4 (four) hours as needed.    Marland Kitchen alendronate (FOSAMAX) 70 MG tablet Take 1 tablet by mouth once a week.    Marland Kitchen aspirin EC 81 MG tablet Take 81 mg by mouth daily.    . calcium citrate-vitamin D 500-400 MG-UNIT chewable tablet Chew 1 tablet by mouth 2 (two) times daily.     . carboxymethylcellulose (REFRESH PLUS) 0.5 % SOLN Place 1-2 drops into both eyes 3 (three) times daily.     . Cholecalciferol (VITAMIN D3) 10 MCG (400 UNIT) CAPS Take by mouth daily. 4 daily    . clobetasol cream (TEMOVATE) 0.05 % APPLY TO AFFECTED AREA DAILY    . Clobetasol Prop Emollient Base (CLOBETASOL PROPIONATE E) 0.05 % emollient cream Apply 1 application topically 2 (two) times daily as needed. 60 g 6  . cloNIDine (CATAPRES) 0.1 MG tablet Take 0.1 mg by mouth at bedtime and may repeat dose one time if needed.   3  . Coenzyme Q10 (COQ10) 200 MG CAPS Take 200 mg by mouth as needed.     . cycloSPORINE (RESTASIS) 0.05 % ophthalmic emulsion Place 1 drop into both eyes 2 (two) times daily.     . Emollient (CERAVE EX) Apply 1 application topically See admin instructions. Apply to affected areas daily as directed or as needed for dryness    . fluticasone (FLONASE) 50 MCG/ACT nasal spray Place 1 spray into both nostrils at bedtime as needed for allergies or rhinitis.    . Garlic 5277 MG CAPS Take 1 capsule by mouth daily.    . hydrALAZINE (APRESOLINE) 50 MG tablet Take 2 tablets (100 mg total) by mouth 3 (three) times daily. (Patient taking  differently: Take 50 mg by mouth 3 (three) times daily. ) 360 tablet 3  . isosorbide dinitrate (ISORDIL) 20 MG tablet TAKE 1 TABLET BY MOUTH THREE TIMES A DAY 270 tablet 1  . Magnesium 250 MG TABS Take 1 tablet by mouth daily.    . Multiple Vitamins-Minerals (MULTIVITAMIN WITH MINERALS) tablet Take 1 tablet by mouth daily. Centrum for Women    . omeprazole (PRILOSEC OTC) 20 MG tablet Take 20 mg by mouth daily as needed (for reflux).     . Pitavastatin Calcium (LIVALO) 1 MG TABS Take 1 mg by mouth daily.    . polyethylene glycol (MIRALAX / GLYCOLAX) packet Take 17 g by mouth at bedtime.     . pyridOXINE (VITAMIN B-6) 100 MG tablet Take 100 mg by mouth daily.     Marland Kitchen spironolactone (ALDACTONE) 50 MG tablet Take 50 mg by mouth daily.     Marland Kitchen triamcinolone cream (KENALOG) 0.1 % Apply 1 application topically 2 (two) times daily as needed (rash/ eczema).   2   No current facility-administered medications on file prior to visit.

## 2020-05-29 NOTE — Telephone Encounter (Signed)
Reviewed hospital chart. No evidence of heart attack. She nay take 1-3 tablets of hydralazine three times a day, if SBP >150 mmHg.  Time spent: 5 min   Nigel Mormon, MD Pager: [phone] Office: [phone]

## 2020-05-30 DIAGNOSIS — I10 Essential (primary) hypertension: Secondary | ICD-10-CM | POA: Diagnosis not present

## 2020-06-09 DIAGNOSIS — I10 Essential (primary) hypertension: Secondary | ICD-10-CM | POA: Diagnosis not present

## 2020-06-16 ENCOUNTER — Ambulatory Visit: Payer: Medicare Other | Admitting: Podiatry

## 2020-07-04 DIAGNOSIS — I10 Essential (primary) hypertension: Secondary | ICD-10-CM | POA: Diagnosis not present

## 2020-07-04 DIAGNOSIS — R0602 Shortness of breath: Secondary | ICD-10-CM | POA: Diagnosis not present

## 2020-07-04 DIAGNOSIS — R232 Flushing: Secondary | ICD-10-CM | POA: Diagnosis not present

## 2020-07-10 DIAGNOSIS — I10 Essential (primary) hypertension: Secondary | ICD-10-CM | POA: Diagnosis not present

## 2020-07-11 ENCOUNTER — Telehealth: Payer: Self-pay

## 2020-07-11 DIAGNOSIS — R059 Cough, unspecified: Secondary | ICD-10-CM | POA: Diagnosis not present

## 2020-07-11 NOTE — Telephone Encounter (Signed)
Patient called that she has been having chest pain/discomfort since last week it is mainly on her right side today she is going to see her pcp but she would like to be seen soon I forward patient to front desk to get her schedule is that fine with you please advise

## 2020-07-11 NOTE — Telephone Encounter (Signed)
I can see her today.

## 2020-07-12 ENCOUNTER — Encounter: Payer: Self-pay | Admitting: Cardiology

## 2020-07-12 ENCOUNTER — Other Ambulatory Visit: Payer: Self-pay

## 2020-07-12 ENCOUNTER — Ambulatory Visit: Payer: Medicare Other | Admitting: Cardiology

## 2020-07-12 VITALS — BP 151/84 | HR 71 | Temp 97.8°F | Resp 16 | Ht 66.0 in | Wt 138.0 lb

## 2020-07-12 DIAGNOSIS — I2510 Atherosclerotic heart disease of native coronary artery without angina pectoris: Secondary | ICD-10-CM | POA: Diagnosis not present

## 2020-07-12 DIAGNOSIS — I10 Essential (primary) hypertension: Secondary | ICD-10-CM

## 2020-07-12 NOTE — Progress Notes (Signed)
Follow up visit  Subjective:   Sara Brown, female    DOB: 31-Oct-1940, 80 y.o.   MRN: [id number]   Chief Complaint  Patient presents with  . Hypertension  . Chest Pain  . Follow-up    80 y/o African-American female with hypertension, diet controlled diabetes, hyperlipidemia, GERD and remote history of TIA, mild nonobstructive coronary artery disease (Cath 04/01/2018).  Reviewed home monitoring log, BP well controlled.Her BP is elevated today. She does have some headache. Also, she has mild tongue numbness, that she has every time with elevated blood pressure. For last few weeks, she has been on Breo Ellipta due to cough, with associated right sided chest pain.   Current Outpatient Medications on File Prior to Visit  Medication Sig Dispense Refill  . albuterol (PROVENTIL HFA;VENTOLIN HFA) 108 (90 Base) MCG/ACT inhaler Inhale 1 puff into the lungs every 4 (four) hours as needed.    Marland Kitchen alendronate (FOSAMAX) 70 MG tablet Take 1 tablet by mouth once a week.    Marland Kitchen aspirin EC 81 MG tablet Take 81 mg by mouth daily.    . calcium citrate-vitamin D 500-400 MG-UNIT chewable tablet Chew 1 tablet by mouth 2 (two) times daily.     . carboxymethylcellulose (REFRESH PLUS) 0.5 % SOLN Place 1-2 drops into both eyes 3 (three) times daily.     . Cholecalciferol (VITAMIN D3) 10 MCG (400 UNIT) CAPS Take by mouth daily. 4 daily    . clobetasol cream (TEMOVATE) 0.05 % APPLY TO AFFECTED AREA DAILY    . Clobetasol Prop Emollient Base (CLOBETASOL PROPIONATE E) 0.05 % emollient cream Apply 1 application topically 2 (two) times daily as needed. 60 g 6  . cloNIDine (CATAPRES) 0.1 MG tablet Take 0.1 mg by mouth at bedtime and may repeat dose one time if needed.   3  . Coenzyme Q10 (COQ10) 200 MG CAPS Take 200 mg by mouth as needed.     . cycloSPORINE (RESTASIS) 0.05 % ophthalmic emulsion Place 1 drop into both eyes 2 (two) times daily.     . Emollient (CERAVE EX) Apply 1 application topically See admin  instructions. Apply to affected areas daily as directed or as needed for dryness    . fluticasone (FLONASE) 50 MCG/ACT nasal spray Place 1 spray into both nostrils at bedtime as needed for allergies or rhinitis.    . Garlic 8177 MG CAPS Take 1 capsule by mouth daily.    . hydrALAZINE (APRESOLINE) 50 MG tablet Take 2 tablets (100 mg total) by mouth 3 (three) times daily. (Patient taking differently: Take 50 mg by mouth 3 (three) times daily. ) 360 tablet 3  . isosorbide dinitrate (ISORDIL) 20 MG tablet TAKE 1 TABLET BY MOUTH THREE TIMES A DAY 270 tablet 1  . Magnesium 250 MG TABS Take 1 tablet by mouth daily.    . Multiple Vitamins-Minerals (MULTIVITAMIN WITH MINERALS) tablet Take 1 tablet by mouth daily. Centrum for Women    . omeprazole (PRILOSEC OTC) 20 MG tablet Take 20 mg by mouth daily as needed (for reflux).     . Pitavastatin Calcium (LIVALO) 1 MG TABS Take 1 mg by mouth daily.    . polyethylene glycol (MIRALAX / GLYCOLAX) packet Take 17 g by mouth at bedtime.     . pyridOXINE (VITAMIN B-6) 100 MG tablet Take 100 mg by mouth daily.     Marland Kitchen spironolactone (ALDACTONE) 50 MG tablet Take 50 mg by mouth daily.     Marland Kitchen triamcinolone cream (KENALOG)  0.1 % Apply 1 application topically 2 (two) times daily as needed (rash/ eczema).   2   No current facility-administered medications on file prior to visit.    Cardiovascular & other pertient studies:  EKG 07/12/2020: Sinus rhythm 65 bpm Low voltage in precordial leads Cannot exclude old anteroseptal infarct  Coronary angiogram 04/01/2018: LM: Normal LAD: Prox-mid 40% stenosis. iFR 0.99 (Physiologically nonsignificant) LCx: Dominant. Small branches with mild disease RCA: Nondominant. Normal   LVEDP mildly elevated.   Carotid artery duplex 02/23/2018: No hemodynamically significant arterial disease in the internal carotid artery bilaterally. No significant plaque burden. Antegrade right vertebral artery flow. Antegrade left vertebral artery  flow.  Echocardiogram 10/29/2017: Left ventricle cavity is normal in size. Mild concentric hypertrophy of the left ventricle. Normal global wall motion. Normal diastolic filling pattern. Calculated EF 69%. No significant valvular abnormality.  Insignificant pericardial effusion.  Exercise myoview stress 10/20/2017: 1. The resting electrocardiogram demonstrated normal sinus rhythm, normal resting conduction and no resting arrhythmias.  The stress electrocardiogram was normal.  Patient exercised on Bruce protocol for 7:43 minutes and achieved 8.54 METS. Stress test terminated due to mid sternal chest pain and 90% % MPHR achieved (Target HR >85%). Normal BP. 2.  Myocardial perfusion imaging is normal. Overall left ventricular systolic function was normal without regional wall motion abnormalities. The left ventricular ejection fraction was 76%.  This is a low risk study.  Recent labs: 05/26/2020: Glucose 115, BUN/Cr 16/1.19. EGFR 46. Na/K 139/4.0 . Rest of the CMP normal H/H 12/36. MCV 88. Platelets 237  10/11/2019: Glucose 123, BUN/Cr 15/0.86. EGFR 60. Na/K 136/4.8. Rest of the CMP normal H/H 12.8/40. MCV 91.5. Platelets 252.  09/20/2019: A1C 6.3% Chol 185, TG 168, HDL 60, LDL 96.  12/31/2018: TSH 1.710   Review of Systems  Cardiovascular: Negative for chest pain, dyspnea on exertion, leg swelling, palpitations and syncope.  Neurological: Positive for headaches.  Psychiatric/Behavioral: The patient is nervous/anxious.         Vitals:   07/12/20 1450  BP: (!) 151/84  Pulse: 71  Resp: 16  Temp: 97.8 F (36.6 C)  SpO2: 98%     Body mass index is 22.27 kg/m. Filed Weights   07/12/20 1450  Weight: 138 lb (62.6 kg)     Objective:   Physical Exam Vitals and nursing note reviewed.  Constitutional:      Appearance: She is well-developed.  Neck:     Vascular: No JVD.  Cardiovascular:     Rate and Rhythm: Normal rate and regular rhythm.     Pulses: Intact distal pulses.      Heart sounds: Normal heart sounds. No murmur heard.   Pulmonary:     Effort: Pulmonary effort is normal.     Breath sounds: Normal breath sounds. No wheezing or rales.         Assessment & Recommendations:   80 y/o African-American female with hypertension, diet controlled diabetes, hyperlipidemia, GERD and remote history of TIA, mild nonobstructive coronary artery disease (Cath 04/01/2018).  Hypertension: Generally well controlled. If SBP does not come down below 140 mmHg, she will take an additional dose of hydralazine 50 mg this morning. Continue hydralazine 50 mg tid, Isordil 20 mg tid, spironolactone 50 mg daily, clonidine 0.1 mg bid  Coronary artery disease involving native coronary artery of native heart without angina pectoris Continue risk factor modification with BP and lipid lowering therapy. EKG shows no ischemia.  Continue aspirin 81 mg daily, pitvastatin. She has not tolerated other statins  in the past.   F/u in 2 weeks   Nigel Mormon, MD [facility] Pager: [phone] Office: [phone]

## 2020-07-14 ENCOUNTER — Other Ambulatory Visit: Payer: Self-pay | Admitting: Internal Medicine

## 2020-07-21 NOTE — Progress Notes (Signed)
Follow up visit  Subjective:   Sara Brown, female    DOB: 05-22-1941, 80 y.o.   MRN: [id number]   Chief Complaint  Patient presents with  . Hypertension  . Follow-up    3 month    80 y/o African-American female with hypertension, diet controlled diabetes, hyperlipidemia, GERD and remote history of TIA, mild nonobstructive coronary artery disease (Cath 04/01/2018).  Blood pressure elevated in the office today, but has been lower at home. Reviewed home blood pressure log, blood pressure average 125/68 mmHg.  She is currently experiencing bilateral revision, which is reportedly a side effect of her pulmonary medication Breo.  Patient has regular follow-up with her PCP, pulmonology as well as ophthalmology.  Current Outpatient Medications on File Prior to Visit  Medication Sig Dispense Refill  . albuterol (PROVENTIL HFA;VENTOLIN HFA) 108 (90 Base) MCG/ACT inhaler Inhale 1 puff into the lungs every 4 (four) hours as needed.    Marland Kitchen alendronate (FOSAMAX) 70 MG tablet Take 1 tablet by mouth once a week.    Marland Kitchen aspirin EC 81 MG tablet Take 81 mg by mouth daily.    Marland Kitchen BREO ELLIPTA 100-25 MCG/INH AEPB Inhale 1 puff into the lungs daily.    . calcium citrate-vitamin D 500-400 MG-UNIT chewable tablet Chew 1 tablet by mouth 2 (two) times daily.     . carboxymethylcellulose (REFRESH PLUS) 0.5 % SOLN Place 1-2 drops into both eyes 3 (three) times daily.    . Cholecalciferol (VITAMIN D3) 10 MCG (400 UNIT) CAPS Take by mouth daily. 4 daily    . Clobetasol Prop Emollient Base (CLOBETASOL PROPIONATE E) 0.05 % emollient cream Apply 1 application topically 2 (two) times daily as needed. 60 g 6  . cloNIDine (CATAPRES) 0.1 MG tablet Take 0.1 mg by mouth at bedtime and may repeat dose one time if needed.   3  . Coenzyme Q10 (COQ10) 200 MG CAPS Take 200 mg by mouth as needed.     . cycloSPORINE (RESTASIS) 0.05 % ophthalmic emulsion Place 1 drop into both eyes 2 (two) times daily.     . Emollient  (CERAVE EX) Apply 1 application topically See admin instructions. Apply to affected areas daily as directed or as needed for dryness    . fluticasone (FLONASE) 50 MCG/ACT nasal spray Place 1 spray into both nostrils at bedtime as needed for allergies or rhinitis.    . Garlic 4315 MG CAPS Take 1 capsule by mouth daily.    . hydrALAZINE (APRESOLINE) 50 MG tablet Take 2 tablets (100 mg total) by mouth 3 (three) times daily. (Patient taking differently: Take 50 mg by mouth 3 (three) times daily.) 360 tablet 3  . isosorbide dinitrate (ISORDIL) 20 MG tablet TAKE 1 TABLET BY MOUTH THREE TIMES A DAY 270 tablet 1  . Magnesium 250 MG TABS Take 1 tablet by mouth daily.    . Multiple Vitamins-Minerals (MULTIVITAMIN WITH MINERALS) tablet Take 1 tablet by mouth daily. Centrum for Women    . omeprazole (PRILOSEC OTC) 20 MG tablet Take 20 mg by mouth daily as needed (for reflux).     . Pitavastatin Calcium 1 MG TABS Take 1 mg by mouth daily.    . polyethylene glycol (MIRALAX / GLYCOLAX) packet Take 17 g by mouth at bedtime.    . pyridOXINE (VITAMIN B-6) 100 MG tablet Take 100 mg by mouth daily.     Marland Kitchen spironolactone (ALDACTONE) 50 MG tablet Take 50 mg by mouth daily.     Marland Kitchen triamcinolone  cream (KENALOG) 0.1 % Apply 1 application topically 2 (two) times daily as needed (rash/ eczema).   2   No current facility-administered medications on file prior to visit.    Cardiovascular & other pertient studies:  EKG 07/12/2020: Sinus rhythm 65 bpm Low voltage in precordial leads Cannot exclude old anteroseptal infarct  Coronary angiogram 04/01/2018: LM: Normal LAD: Prox-mid 40% stenosis. iFR 0.99 (Physiologically nonsignificant) LCx: Dominant. Small branches with mild disease RCA: Nondominant. Normal   LVEDP mildly elevated.   Carotid artery duplex 02/23/2018: No hemodynamically significant arterial disease in the internal carotid artery bilaterally. No significant plaque burden. Antegrade right vertebral artery  flow. Antegrade left vertebral artery flow.  Echocardiogram 10/29/2017: Left ventricle cavity is normal in size. Mild concentric hypertrophy of the left ventricle. Normal global wall motion. Normal diastolic filling pattern. Calculated EF 69%. No significant valvular abnormality.  Insignificant pericardial effusion.  Exercise myoview stress 10/20/2017: 1. The resting electrocardiogram demonstrated normal sinus rhythm, normal resting conduction and no resting arrhythmias.  The stress electrocardiogram was normal.  Patient exercised on Bruce protocol for 7:43 minutes and achieved 8.54 METS. Stress test terminated due to mid sternal chest pain and 90% % MPHR achieved (Target HR >85%). Normal BP. 2.  Myocardial perfusion imaging is normal. Overall left ventricular systolic function was normal without regional wall motion abnormalities. The left ventricular ejection fraction was 76%.  This is a low risk study.  Recent labs: 05/26/2020: Glucose 115, BUN/Cr 16/1.19. EGFR 46. Na/K 139/4.0 . Rest of the CMP normal H/H 12/36. MCV 88. Platelets 237  10/11/2019: Glucose 123, BUN/Cr 15/0.86. EGFR 60. Na/K 136/4.8. Rest of the CMP normal H/H 12.8/40. MCV 91.5. Platelets 252.  09/20/2019: A1C 6.3% Chol 185, TG 168, HDL 60, LDL 96.  12/31/2018: TSH 1.710   Review of Systems  Cardiovascular: Negative for chest pain, dyspnea on exertion, leg swelling, palpitations and syncope.  Neurological: Positive for headaches.  Psychiatric/Behavioral: The patient is nervous/anxious.         Vitals:   07/24/20 0952  BP: (!) 147/76  Pulse: 71  Resp: 16  Temp: (!) 97.4 F (36.3 C)  SpO2: 97%     Body mass index is 22.5 kg/m. Filed Weights   07/24/20 0952  Weight: 139 lb 6.4 oz (63.2 kg)     Objective:   Physical Exam Vitals and nursing note reviewed.  Constitutional:      Appearance: She is well-developed.  Neck:     Vascular: No JVD.  Cardiovascular:     Rate and Rhythm: Normal rate and  regular rhythm.     Pulses: Intact distal pulses.     Heart sounds: Normal heart sounds. No murmur heard.   Pulmonary:     Effort: Pulmonary effort is normal.     Breath sounds: Normal breath sounds. No wheezing or rales.         Assessment & Recommendations:   80 y/o African-American female with hypertension, diet controlled diabetes, hyperlipidemia, GERD and remote history of TIA, mild nonobstructive coronary artery disease (Cath 04/01/2018).  Hypertension: Generally well controlled. If SBP does not come down below 140 mmHg, she will take an additional dose of hydralazine 50 mg this morning. Continue hydralazine 50 mg tid, Isordil 20 mg tid, spironolactone 50 mg daily, clonidine 0.1 mg bid  Coronary artery disease involving native coronary artery of native heart without angina pectoris Continue risk factor modification with BP and lipid lowering therapy. EKG shows no ischemia.  Continue aspirin 81 mg daily, pitvastatin. She  has not tolerated other statins in the past.   F/u in 3 months   Esther Hardy, MD [facility] Pager: [phone] Office: [phone]

## 2020-07-24 ENCOUNTER — Encounter: Payer: Self-pay | Admitting: Cardiology

## 2020-07-24 ENCOUNTER — Ambulatory Visit: Payer: Medicare Other | Admitting: Cardiology

## 2020-07-24 ENCOUNTER — Other Ambulatory Visit: Payer: Self-pay

## 2020-07-24 VITALS — BP 147/76 | HR 71 | Temp 97.4°F | Resp 16 | Ht 66.0 in | Wt 139.4 lb

## 2020-07-24 DIAGNOSIS — I2510 Atherosclerotic heart disease of native coronary artery without angina pectoris: Secondary | ICD-10-CM | POA: Diagnosis not present

## 2020-07-24 DIAGNOSIS — I10 Essential (primary) hypertension: Secondary | ICD-10-CM | POA: Diagnosis not present

## 2020-08-10 DIAGNOSIS — I10 Essential (primary) hypertension: Secondary | ICD-10-CM | POA: Diagnosis not present

## 2020-08-14 ENCOUNTER — Telehealth: Payer: Self-pay

## 2020-08-14 NOTE — Telephone Encounter (Signed)
Patient stated that she normally takes her Isosorbide and Hydralazine 4 hours apart. Patient states that her BP has been elevated frequently for several weeks and she upped her dosage and it is still high, not everyday, but to frequently, and is concerned that she may run out of the Hydralazine too soon. She wants to know what she should do at this point? Please advise.    MA's please call her at : [phone] or [phone]

## 2020-08-14 NOTE — Telephone Encounter (Signed)
-----   Message from Kem Boroughs sent at 08/14/2020  9:46 AM EST ----- Regarding: blood pressure Patient states that her BP has been elevated frequently for several weeks and she upped her dosage and it is still high. Please call her [phone] or [phone]

## 2020-08-14 NOTE — Telephone Encounter (Signed)
How are the numbers looking, Teny?

## 2020-08-14 NOTE — Telephone Encounter (Signed)
Called and reviewed readings with pt. Avg BP readings over the last week of 136/74. SBP ranging from 124-155/61-85. Called and reviewed with pt. Pt reports that she has been taking extra hydralazine when SBP>150. Used it twice over the past week. Reports that she continues to have headache complains that has been chronic, no recent worsening. Reviewed that it was okay to take the extra hydralazine. Will send in updated SIG directions when pt needs refills. Pt to keep a log of her headache symptoms over the next few days to identify potential triggers. May consider holding isosorbide if HA symptoms associated with BP and/or isosorbide or other causes.

## 2020-08-18 ENCOUNTER — Ambulatory Visit (INDEPENDENT_AMBULATORY_CARE_PROVIDER_SITE_OTHER): Payer: Medicare Other | Admitting: Podiatry

## 2020-08-18 ENCOUNTER — Telehealth: Payer: Self-pay | Admitting: Pharmacist

## 2020-08-18 ENCOUNTER — Other Ambulatory Visit: Payer: Self-pay

## 2020-08-18 ENCOUNTER — Encounter: Payer: Self-pay | Admitting: Podiatry

## 2020-08-18 VITALS — BP 152/80

## 2020-08-18 DIAGNOSIS — M79674 Pain in right toe(s): Secondary | ICD-10-CM | POA: Diagnosis not present

## 2020-08-18 DIAGNOSIS — E7800 Pure hypercholesterolemia, unspecified: Secondary | ICD-10-CM

## 2020-08-18 DIAGNOSIS — K5909 Other constipation: Secondary | ICD-10-CM

## 2020-08-18 DIAGNOSIS — L84 Corns and callosities: Secondary | ICD-10-CM | POA: Diagnosis not present

## 2020-08-18 DIAGNOSIS — M810 Age-related osteoporosis without current pathological fracture: Secondary | ICD-10-CM

## 2020-08-18 DIAGNOSIS — E1142 Type 2 diabetes mellitus with diabetic polyneuropathy: Secondary | ICD-10-CM

## 2020-08-18 DIAGNOSIS — M2041 Other hammer toe(s) (acquired), right foot: Secondary | ICD-10-CM

## 2020-08-18 DIAGNOSIS — M2042 Other hammer toe(s) (acquired), left foot: Secondary | ICD-10-CM

## 2020-08-18 DIAGNOSIS — B351 Tinea unguium: Secondary | ICD-10-CM | POA: Diagnosis not present

## 2020-08-18 DIAGNOSIS — M79675 Pain in left toe(s): Secondary | ICD-10-CM

## 2020-08-18 HISTORY — DX: Other constipation: K59.09

## 2020-08-18 HISTORY — DX: Pure hypercholesterolemia, unspecified: E78.00

## 2020-08-18 HISTORY — DX: Age-related osteoporosis without current pathological fracture: M81.0

## 2020-08-18 NOTE — Telephone Encounter (Signed)
Pt called the office this morning with concerns of elevated BP readings and concerns of chronic/recurrent health symptoms associated with pt reported BP abnormalities. Remote BP noted to still remain stable, with rare BP elevations. In office BP 134/78, HR:80. BP elevations associated with episodes of health associated anxiety, abnormal sensations in pt's tongue, and vision. Has been chronic with no noted change in frequency or severity of symptoms. Pt to continue keeping a symptom diary to identify any potential patterns. Symptoms have improved since.   Dosing schedule discussed:   8 AM: Hydralazine 50 mg, Isosorbide 20 mg, spironolactone 50 mg  1 PM: Hydralazine 50 mg, Isosorbide 20 mg,   6 PM: Hydralazine 50 mg, Isosorbide 20 mg,   9 PM: Hydralazine 50 mg, Clonidine 0.1 mg

## 2020-08-20 NOTE — Progress Notes (Signed)
Subjective: Sara Brown presents today for follow up of preventative diabetic foot care, painful mycotic nails b/l that are difficult to trim. Pain interferes with ambulation. Aggravating factors include wearing enclosed shoe gear. Pain is relieved with periodic professional debridement and painful plantar lesions b/l feet.  Pain prevent comfortable ambulation. Aggravating factor is weightbearing with or without shoegear.   She states she is not feeling well in clinic today. She is on the phone with the pharmacist from her [place] office. She is not having chest pain, but just doesn't feel right. She will go to Cardiologist's office when she leaves here today.  Allergies  Allergen Reactions  . Naproxen Other (See Comments)    Patient preference  Other reaction(s): upset stomach  . Prednisone Other (See Comments)    Patient Preference  Other reaction(s): upset stomach  . Amlodipine Hives  . Losartan Rash     Objective: Vitals:   08/18/20 0919  BP: (!) 152/80   Pt is a pleasant 80 y.o. year old AA female WD, WN in NAD. AAO x 3.   Vascular Examination:  Capillary refill time to digits immediate b/l. Palpable DP pulses b/l. Palpable PT pulses b/l. Pedal hair absent b/l Skin temperature gradient within normal limits b/l. No edema noted b/l. No pain with calf compression b/l.  Dermatological Examination: Pedal skin with normal turgor, texture and tone bilaterally. No open wounds bilaterally. No interdigital macerations bilaterally. Toenails 1-5 b/l elongated, dystrophic, thickened, crumbly with subungual debris and tenderness to dorsal palpation. Hyperkeratotic lesion(s) submet head 1 right foot.  No erythema, no edema, no drainage, no flocculence. Porokeratotic lesion(s) plantarlateral arch left foot. No erythema, no edema, no drainage, no flocculence.  Musculoskeletal: Normal muscle strength 5/5 to all lower extremity muscle groups bilaterally. No pain crepitus or joint  limitation noted with ROM b/l. Hammertoes noted to the 2-5 bilaterally.  Neurological: Protective sensation diminished with 10g monofilament b/l. Vibratory sensation intact b/l.  Assessment: 1. Pain due to onychomycosis of toenails of both feet   2. Callus   3. Hammer toes of both feet   4. Diabetic peripheral neuropathy associated with type 2 diabetes mellitus ([place])    Plan: -We did take her vitals today. -No new findings. No new orders. -Continue diabetic foot care principles. Literature dispensed on today.  -Toenails 1-5 b/l were debrided in length and girth with sterile nail nippers and dremel without iatrogenic bleeding.  -Callus(es) submet head 1 right foot pared utilizing sterile scalpel blade without complication or incident. Total number debrided =1. -Painful porokeratotic lesion(s) plantarlateral arch left foot pared and enucleated with sterile scalpel blade without incident. Total number debrided=1. -Patient to continue soft, supportive shoe gear daily. -Patient to report any pedal injuries to medical professional immediately. -Patient/POA to call should there be question/concern in the interim.  Return in about 3 months (around 11/18/2020).

## 2020-08-30 ENCOUNTER — Ambulatory Visit (INDEPENDENT_AMBULATORY_CARE_PROVIDER_SITE_OTHER): Payer: Medicare Other | Admitting: Dermatology

## 2020-08-30 ENCOUNTER — Encounter: Payer: Self-pay | Admitting: Dermatology

## 2020-08-30 ENCOUNTER — Other Ambulatory Visit: Payer: Self-pay

## 2020-08-30 DIAGNOSIS — I2510 Atherosclerotic heart disease of native coronary artery without angina pectoris: Secondary | ICD-10-CM

## 2020-08-30 DIAGNOSIS — L309 Dermatitis, unspecified: Secondary | ICD-10-CM | POA: Diagnosis not present

## 2020-09-04 ENCOUNTER — Emergency Department (HOSPITAL_COMMUNITY): Payer: Medicare Other

## 2020-09-04 ENCOUNTER — Telehealth: Payer: Self-pay

## 2020-09-04 ENCOUNTER — Other Ambulatory Visit: Payer: Self-pay

## 2020-09-04 ENCOUNTER — Observation Stay (HOSPITAL_COMMUNITY)
Admission: EM | Admit: 2020-09-04 | Discharge: 2020-09-06 | Disposition: A | Discharge status: Home / Self Care | Payer: Medicare Other | Attending: Internal Medicine | Admitting: Internal Medicine

## 2020-09-04 ENCOUNTER — Encounter (HOSPITAL_COMMUNITY): Payer: Self-pay | Admitting: Emergency Medicine

## 2020-09-04 DIAGNOSIS — G459 Transient cerebral ischemic attack, unspecified: Secondary | ICD-10-CM | POA: Diagnosis not present

## 2020-09-04 DIAGNOSIS — E119 Type 2 diabetes mellitus without complications: Secondary | ICD-10-CM

## 2020-09-04 DIAGNOSIS — I10 Essential (primary) hypertension: Secondary | ICD-10-CM | POA: Diagnosis not present

## 2020-09-04 DIAGNOSIS — J45909 Unspecified asthma, uncomplicated: Secondary | ICD-10-CM | POA: Diagnosis not present

## 2020-09-04 DIAGNOSIS — Z7984 Long term (current) use of oral hypoglycemic drugs: Secondary | ICD-10-CM | POA: Diagnosis not present

## 2020-09-04 DIAGNOSIS — Z79899 Other long term (current) drug therapy: Secondary | ICD-10-CM | POA: Diagnosis not present

## 2020-09-04 DIAGNOSIS — R202 Paresthesia of skin: Secondary | ICD-10-CM | POA: Diagnosis not present

## 2020-09-04 DIAGNOSIS — I6522 Occlusion and stenosis of left carotid artery: Secondary | ICD-10-CM | POA: Diagnosis not present

## 2020-09-04 DIAGNOSIS — R079 Chest pain, unspecified: Secondary | ICD-10-CM | POA: Diagnosis present

## 2020-09-04 DIAGNOSIS — R200 Anesthesia of skin: Secondary | ICD-10-CM | POA: Diagnosis present

## 2020-09-04 DIAGNOSIS — Z8673 Personal history of transient ischemic attack (TIA), and cerebral infarction without residual deficits: Secondary | ICD-10-CM | POA: Diagnosis not present

## 2020-09-04 DIAGNOSIS — I2510 Atherosclerotic heart disease of native coronary artery without angina pectoris: Secondary | ICD-10-CM | POA: Diagnosis not present

## 2020-09-04 DIAGNOSIS — R9431 Abnormal electrocardiogram [ECG] [EKG]: Secondary | ICD-10-CM | POA: Diagnosis not present

## 2020-09-04 DIAGNOSIS — Z20822 Contact with and (suspected) exposure to covid-19: Secondary | ICD-10-CM | POA: Diagnosis not present

## 2020-09-04 DIAGNOSIS — Z7982 Long term (current) use of aspirin: Secondary | ICD-10-CM | POA: Diagnosis not present

## 2020-09-04 DIAGNOSIS — J9811 Atelectasis: Secondary | ICD-10-CM | POA: Diagnosis not present

## 2020-09-04 DIAGNOSIS — K219 Gastro-esophageal reflux disease without esophagitis: Secondary | ICD-10-CM | POA: Diagnosis present

## 2020-09-04 DIAGNOSIS — E7800 Pure hypercholesterolemia, unspecified: Secondary | ICD-10-CM | POA: Diagnosis present

## 2020-09-04 LAB — BASIC METABOLIC PANEL
Anion gap: 4 — ABNORMAL LOW (ref 5–15)
BUN: 20 mg/dL (ref 8–23)
CO2: 29 mmol/L (ref 22–32)
Calcium: 9.9 mg/dL (ref 8.9–10.3)
Chloride: 103 mmol/L (ref 98–111)
Creatinine, Ser: 0.92 mg/dL (ref 0.44–1.00)
GFR, Estimated: >60 mL/min (ref >60)
Glucose, Bld: 110 mg/dL — ABNORMAL HIGH (ref 70–99)
Potassium: 4.1 mmol/L (ref 3.5–5.1)
Sodium: 136 mmol/L (ref 135–145)

## 2020-09-04 LAB — CBC
HCT: 37.3 % (ref 36.0–46.0)
Hemoglobin: 12.5 g/dL (ref 12.0–15.0)
MCH: 30.1 pg (ref 26.0–34.0)
MCHC: 33.5 g/dL (ref 30.0–36.0)
MCV: 89.9 fL (ref 80.0–100.0)
Platelets: 246 10*3/uL (ref 150–400)
RBC: 4.15 MIL/uL (ref 3.87–5.11)
RDW: 14.1 % (ref 11.5–15.5)
WBC: 6.3 10*3/uL (ref 4.0–10.5)
nRBC: 0 % (ref 0.0–0.2)

## 2020-09-04 LAB — TROPONIN I (HIGH SENSITIVITY)
Troponin I (High Sensitivity): 3 ng/L
Troponin I (High Sensitivity): 3 ng/L

## 2020-09-04 NOTE — Telephone Encounter (Signed)
Patient called, stated that she is having numbness and tingling and pain in her whole left side of her body, and it feels way different than the right side. Patient advised to go to the ER, per Dr. Virgina Jock, patient is agreeable.

## 2020-09-04 NOTE — ED Triage Notes (Signed)
Pt. Stated, I feel like its a sensation in my legs. No arm drift symmetrical smile Bilateral = grips

## 2020-09-04 NOTE — ED Triage Notes (Signed)
Pt. Stated, today my legs felt abnormal and felt like I couldn't walk, and my BP was up. I also having some chest pain and dont know if its related to my asthma. Earlier today my face felt tight.

## 2020-09-05 ENCOUNTER — Emergency Department (HOSPITAL_COMMUNITY): Payer: Medicare Other

## 2020-09-05 ENCOUNTER — Other Ambulatory Visit: Payer: Self-pay

## 2020-09-05 ENCOUNTER — Observation Stay (HOSPITAL_COMMUNITY): Payer: Medicare Other

## 2020-09-05 DIAGNOSIS — E118 Type 2 diabetes mellitus with unspecified complications: Secondary | ICD-10-CM | POA: Diagnosis not present

## 2020-09-05 DIAGNOSIS — K219 Gastro-esophageal reflux disease without esophagitis: Secondary | ICD-10-CM

## 2020-09-05 DIAGNOSIS — I2510 Atherosclerotic heart disease of native coronary artery without angina pectoris: Secondary | ICD-10-CM

## 2020-09-05 DIAGNOSIS — R202 Paresthesia of skin: Secondary | ICD-10-CM

## 2020-09-05 DIAGNOSIS — I10 Essential (primary) hypertension: Secondary | ICD-10-CM

## 2020-09-05 DIAGNOSIS — R079 Chest pain, unspecified: Secondary | ICD-10-CM

## 2020-09-05 DIAGNOSIS — E7800 Pure hypercholesterolemia, unspecified: Secondary | ICD-10-CM

## 2020-09-05 DIAGNOSIS — G459 Transient cerebral ischemic attack, unspecified: Secondary | ICD-10-CM | POA: Diagnosis not present

## 2020-09-05 DIAGNOSIS — I639 Cerebral infarction, unspecified: Secondary | ICD-10-CM | POA: Diagnosis not present

## 2020-09-05 HISTORY — DX: Paresthesia of skin: R20.2

## 2020-09-05 LAB — TSH: TSH: 3.258 u[IU]/mL (ref 0.350–4.500)

## 2020-09-05 LAB — SARS CORONAVIRUS 2 (TAT 6-24 HRS): SARS Coronavirus 2: NEGATIVE

## 2020-09-05 LAB — HEMOGLOBIN A1C
Hgb A1c MFr Bld: 6.4 % — ABNORMAL HIGH (ref 4.8–5.6)
Mean Plasma Glucose: 136.98 mg/dL

## 2020-09-05 LAB — MAGNESIUM: Magnesium: 2.1 mg/dL (ref 1.7–2.4)

## 2020-09-05 MED ORDER — CLOBETASOL PROPIONATE 0.05 % EX CREA
1.0000 "application " | TOPICAL_CREAM | Freq: Two times a day (BID) | CUTANEOUS | Status: DC | PRN
  Filled 2020-09-05: qty 15

## 2020-09-05 MED ORDER — MOMETASONE FURO-FORMOTEROL FUM 100-5 MCG/ACT IN AERO
2.0000 | INHALATION_SPRAY | Freq: Two times a day (BID) | RESPIRATORY_TRACT | Status: DC
  Filled 2020-09-05: qty 8.8

## 2020-09-05 MED ORDER — LEVETIRACETAM 500 MG PO TABS
500.0000 mg | ORAL_TABLET | Freq: Two times a day (BID) | ORAL | Status: DC
  Administered 2020-09-05 – 2020-09-06 (×3): 500 mg via ORAL
  Filled 2020-09-05 (×3): qty 1

## 2020-09-05 MED ORDER — SODIUM CHLORIDE 0.9 % IV SOLN: Freq: Once | INTRAVENOUS | Status: AC

## 2020-09-05 MED ORDER — GADOBUTROL 1 MMOL/ML IV SOLN
6.0000 mL | Freq: Once | INTRAVENOUS | Status: AC | PRN
  Administered 2020-09-05: 6 mL via INTRAVENOUS

## 2020-09-05 MED ORDER — HYDRALAZINE HCL 50 MG PO TABS
50.0000 mg | ORAL_TABLET | Freq: Three times a day (TID) | ORAL | Status: DC
  Administered 2020-09-05 – 2020-09-06 (×4): 50 mg via ORAL
  Filled 2020-09-05 (×4): qty 1

## 2020-09-05 MED ORDER — SENNOSIDES-DOCUSATE SODIUM 8.6-50 MG PO TABS: 1.0000 | ORAL_TABLET | Freq: Every evening | ORAL | Status: DC | PRN

## 2020-09-05 MED ORDER — ACETAMINOPHEN 650 MG RE SUPP: 650.0000 mg | RECTAL | Status: DC | PRN

## 2020-09-05 MED ORDER — OMEPRAZOLE MAGNESIUM 20 MG PO TBEC: 20.0000 mg | DELAYED_RELEASE_TABLET | Freq: Every day | ORAL | Status: DC | PRN

## 2020-09-05 MED ORDER — CYCLOSPORINE 0.05 % OP EMUL
1.0000 [drp] | Freq: Two times a day (BID) | OPHTHALMIC | Status: DC
  Administered 2020-09-05: 1 [drp] via OPHTHALMIC
  Filled 2020-09-05 (×4): qty 1

## 2020-09-05 MED ORDER — PRAVASTATIN SODIUM 10 MG PO TABS
20.0000 mg | ORAL_TABLET | Freq: Every day | ORAL | Status: DC
  Administered 2020-09-06: 20 mg via ORAL
  Filled 2020-09-05: qty 2

## 2020-09-05 MED ORDER — SPIRONOLACTONE 25 MG PO TABS
50.0000 mg | ORAL_TABLET | Freq: Every day | ORAL | Status: DC
  Administered 2020-09-06: 50 mg via ORAL
  Filled 2020-09-05: qty 2

## 2020-09-05 MED ORDER — ACETAMINOPHEN 160 MG/5ML PO SOLN: 650.0000 mg | ORAL | Status: DC | PRN

## 2020-09-05 MED ORDER — ACETAMINOPHEN 325 MG PO TABS: 650.0000 mg | ORAL_TABLET | ORAL | Status: DC | PRN

## 2020-09-05 MED ORDER — FLUTICASONE PROPIONATE 50 MCG/ACT NA SUSP
1.0000 | Freq: Every evening | NASAL | Status: DC | PRN
  Filled 2020-09-05: qty 16

## 2020-09-05 MED ORDER — CLONIDINE HCL 0.1 MG PO TABS
0.1000 mg | ORAL_TABLET | Freq: Every day | ORAL | Status: DC
  Administered 2020-09-05: 0.1 mg via ORAL
  Filled 2020-09-05: qty 1

## 2020-09-05 MED ORDER — ASPIRIN 81 MG PO CHEW
324.0000 mg | CHEWABLE_TABLET | Freq: Once | ORAL | Status: AC
  Administered 2020-09-05: 324 mg via ORAL
  Filled 2020-09-05: qty 4

## 2020-09-05 MED ORDER — LEVETIRACETAM 500 MG PO TABS: 500.0000 mg | ORAL_TABLET | Freq: Two times a day (BID) | ORAL | Status: DC

## 2020-09-05 MED ORDER — ASPIRIN EC 81 MG PO TBEC
81.0000 mg | DELAYED_RELEASE_TABLET | Freq: Every day | ORAL | Status: DC
  Administered 2020-09-06: 81 mg via ORAL
  Filled 2020-09-05: qty 1

## 2020-09-05 MED ORDER — HYPROMELLOSE (GONIOSCOPIC) 2.5 % OP SOLN
1.0000 [drp] | Freq: Three times a day (TID) | OPHTHALMIC | Status: DC
  Filled 2020-09-05 (×2): qty 15

## 2020-09-05 MED ORDER — STROKE: EARLY STAGES OF RECOVERY BOOK: Freq: Once | Status: DC

## 2020-09-05 MED ORDER — ISOSORBIDE DINITRATE 20 MG PO TABS
20.0000 mg | ORAL_TABLET | Freq: Three times a day (TID) | ORAL | Status: DC
  Administered 2020-09-05 – 2020-09-06 (×3): 20 mg via ORAL
  Filled 2020-09-05 (×7): qty 1

## 2020-09-05 MED ORDER — CARBOXYMETHYLCELLULOSE SODIUM 0.5 % OP SOLN: 1.0000 [drp] | Freq: Three times a day (TID) | OPHTHALMIC | Status: DC

## 2020-09-05 MED ORDER — PANTOPRAZOLE SODIUM 40 MG PO TBEC
40.0000 mg | DELAYED_RELEASE_TABLET | Freq: Every day | ORAL | Status: DC
Start: 2020-09-05 — End: 2020-09-07
  Administered 2020-09-06: 40 mg via ORAL
  Filled 2020-09-05: qty 1

## 2020-09-05 MED ORDER — ENOXAPARIN SODIUM 40 MG/0.4ML ~~LOC~~ SOLN
40.0000 mg | SUBCUTANEOUS | Status: DC
  Administered 2020-09-05 – 2020-09-06 (×2): 40 mg via SUBCUTANEOUS
  Filled 2020-09-05 (×2): qty 0.4

## 2020-09-05 NOTE — ED Notes (Signed)
ED Provider at bedside. 

## 2020-09-05 NOTE — Plan of Care (Signed)
No charge note  Neurology plan of care  EEG negative for epileptiform discharges, but positive for left temporal lobe focal cerebral dysfunction.   Given stereotyped spells in past coupled with left temporal lobe focal cerebral dysfunction, will treat.   Keppra 500mg  po q12 hours. SEs, timing of dosing, watching for depression, having an additional seizure, along with [place] driving laws and safety seizure at home precautions were discussed with patient. Questions asked and answered.   She already has a neurologist, Dr. Rexene Alberts, at [facility]. Medicine, please have patient f/up in 2-4 weeks with neurologist.   Per [facility] statutes, patients with seizures are not allowed to drive until they have been seizure-free for six months.   Use caution when using heavy equipment or power tools. Avoid working on ladders or at heights. Take showers instead of baths. Ensure the water temperature is not too high on the home water heater. Do not go swimming alone. Do not lock yourself in a room alone (i.e. bathroom). When caring for infants or small children, sit down when holding, feeding, or changing them to minimize risk of injury to the child in the event you have a seizure. Maintain good sleep hygiene. Avoid alcohol.   If patient has another seizure, call 911 and bring them back to the ED if: A. The seizure lasts longer than 5 minutes.  B. The patient doesn't wake shortly after the seizure or has new problems such as difficulty seeing, speaking or moving following the seizure C. The patient was injured during the seizure D. The patient has a temperature over 102 F (39C) E. The patient vomited during the seizure and now is having trouble breathing  Neurology without any further recommendations.   Clance Boll, MSN, APN-BC Neurology Nurse Practitioner Pager [phone]

## 2020-09-05 NOTE — ED Provider Notes (Signed)
[facility] EMERGENCY DEPARTMENT Provider Note   CSN: [id number] Arrival date & time: 09/04/20  1627   History Chief Complaint  Patient presents with  . Leg Pain  . Chest Pain    Sara Brown is a 80 y.o. female.  The history is provided by the patient.  Leg Pain Chest Pain She has history of hypertension, diabetes, hyperlipidemia, coronary artery disease and comes in because of a funny feeling in her face and legs.  She states that about 2:30 PM, she noted a numbness in the left side of her face and some aching in the left side of her neck posteriorly.  She then noted that she was having some difficulty walking which she related slightly some difficulty with her left leg but she has difficulty describing it.  Symptoms have mostly resolved.  When she got to the emergency department, she was able to walk back to her room, and she states that her gait was improved over what it was at home but not back to its baseline.  The numbness in her face has resolved.  She is not sure how long it was there in toto.  She denies any headache.  She denies any numbness in her arms or legs.  There is no nausea or vomiting.  She told triage nurse that she had had some chest pain but denied that to me.  Past Medical History:  Diagnosis Date  . Allergic rhinitis   . Arthritis    "fingers, knees" (02/09/2018)  . Asthma    "stopped taking RX after dr said I don't have this" (02/09/2018)  . Carotid stenosis    ICA(L)  . Complication of anesthesia    "felt the cut w/one of my c-sections" (02/09/2018)  . Constipation   . GERD (gastroesophageal reflux disease)   . Headache    "years ago; before menopause" (02/09/2018)  . Hyperlipidemia   . Hypertension   . Neuropathy    face (R)  . OA (osteoarthritis)   . Osteoporosis   . TIA (transient ischemic attack) ?2005  . Type II diabetes mellitus ([place])   . Vitamin D deficiency     Patient Active Problem List   Diagnosis Date Noted  .  Age-related osteoporosis without current pathological fracture 08/18/2020  . Pure hypercholesterolemia 08/18/2020  . Chronic constipation 08/18/2020  . Right ear impacted cerumen 10/26/2019  . Coronary artery disease involving native coronary artery of native heart without angina pectoris 09/17/2018  . Diabetes mellitus type 2 in nonobese ([place]) 03/31/2018  . Essential hypertension 03/31/2018  . Dyslipidemia 03/31/2018  . Chest pain 02/10/2018  . Chest pain due to coronary artery disease ([place]) 02/09/2018  . Angina at rest [facility]) 02/09/2018  . Bradycardia 02/09/2018  . Skin laxity 06/12/2017  . Hirsutism 06/12/2017  . Hyperpigmentation 02/13/2017  . Neck mass 01/17/2017  . 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 Ear pain, left 02/21/2016  . GERD (gastroesophageal reflux disease)   . Shortness of breath 07/30/2007  . Cough 07/30/2007    Past Surgical History:  Procedure Laterality Date  . CATARACT EXTRACTION W/ INTRAOCULAR LENS  IMPLANT, BILATERAL Bilateral   . CESAREAN SECTION  1971; 1975  . INTRAVASCULAR PRESSURE WIRE/FFR STUDY N/A 04/01/2018   Procedure: INTRAVASCULAR PRESSURE WIRE/FFR STUDY;  Surgeon: Nigel Mormon, MD;  Location: [facility];  Service: Cardiovascular;  Laterality: N/A;  . LEFT HEART CATH AND CORONARY ANGIOGRAPHY N/A 04/01/2018  Procedure: LEFT HEART CATH AND CORONARY ANGIOGRAPHY;  Surgeon: Nigel Mormon, MD;  Location: [facility];  Service: Cardiovascular;  Laterality: N/A;  . TUBAL LIGATION  1975     OB History   No obstetric history on file.     Family History  Problem Relation Age of Onset  . Diabetes Mellitus II Mother   . Osteoporosis Mother   . Hypertension Mother   . Diabetes Mellitus II Father   . CVA Sister        4  . CAD Sister        4  . Hypertension Sister        4  . Osteoarthritis Sister        4  . Alcoholism Brother   . Breast cancer Cousin 21     Social History   Tobacco Use  . Smoking status: Never Smoker  . Smokeless tobacco: Never Used  Vaping Use  . Vaping Use: Never used  Substance Use Topics  . Alcohol use: Never  . Drug use: Never    Home Medications Prior to Admission medications   Medication Sig Start Date End Date Taking? Authorizing Provider  ADVAIR DISKUS 100-50 MCG/DOSE AEPB 1 puff 2 (two) times daily. 07/26/20   [provider]  albuterol (PROVENTIL HFA;VENTOLIN HFA) 108 (90 Base) MCG/ACT inhaler Inhale 1 puff into the lungs every 4 (four) hours as needed. 07/23/18   [provider]  alendronate (FOSAMAX) 70 MG tablet Take 1 tablet by mouth once a week. 08/09/18   [provider]  aspirin EC 81 MG tablet Take 81 mg by mouth daily.    [provider]  BREO ELLIPTA 100-25 MCG/INH AEPB Inhale 1 puff into the lungs daily. 07/11/20   [provider]  calcium citrate-vitamin D 500-400 MG-UNIT chewable tablet Chew 1 tablet by mouth 2 (two) times daily.     [provider]  carboxymethylcellulose (REFRESH PLUS) 0.5 % SOLN Place 1-2 drops into both eyes 3 (three) times daily.    [provider]  Cholecalciferol (VITAMIN D3) 10 MCG (400 UNIT) CAPS Take by mouth daily. 4 daily    [provider]  Clobetasol Prop Emollient Base (CLOBETASOL PROPIONATE E) 0.05 % emollient cream Apply 1 application topically 2 (two) times daily as needed. 12/08/19   Lavonna Monarch, MD  cloNIDine (CATAPRES) 0.1 MG tablet Take 0.1 mg by mouth at bedtime and may repeat dose one time if needed.  10/01/17   [provider]  Coenzyme Q10 (COQ10) 200 MG CAPS Take 200 mg by mouth as needed.     [provider]  cycloSPORINE (RESTASIS) 0.05 % ophthalmic emulsion Place 1 drop into both eyes 2 (two) times daily.     [provider]  fluticasone (FLONASE) 50 MCG/ACT nasal spray Place 1 spray into both nostrils at bedtime as needed for allergies or rhinitis.     [provider]  Fluticasone-Salmeterol (ADVAIR) 100-50 MCG/DOSE AEPB 1 puff 07/26/20   [provider]  Garlic 1660 MG CAPS Take 1 capsule by mouth daily.    [provider]  hydrALAZINE (APRESOLINE) 50 MG tablet Take 2 tablets (100 mg total) by mouth 3 (three) times daily. Patient taking differently: Take 50 mg by mouth 4 (four) times daily. 09/10/19 04/21/20  Patwardhan, Reynold Bowen, MD  isosorbide dinitrate (ISORDIL) 20 MG tablet TAKE 1 TABLET BY MOUTH THREE TIMES A DAY 03/27/20   Patwardhan, Reynold Bowen, MD  Magnesium 250 MG TABS  Take 1 tablet by mouth daily.    [provider]  Multiple Vitamins-Minerals (MULTIVITAMIN WITH MINERALS) tablet Take 1 tablet by mouth daily. Centrum for Women    [provider]  omeprazole (PRILOSEC OTC) 20 MG tablet Take 20 mg by mouth daily as needed (for reflux).     [provider]  Pitavastatin Calcium (LIVALO) 1 MG TABS Take 1 tablet by mouth daily.    [provider]  polyethylene glycol (MIRALAX / GLYCOLAX) packet Take 17 g by mouth at bedtime.    [provider]  pyridOXINE (VITAMIN B-6) 100 MG tablet Take 100 mg by mouth daily.     [provider]  spironolactone (ALDACTONE) 50 MG tablet Take 50 mg by mouth daily.     [provider]  triamcinolone cream (KENALOG) 0.1 % Apply 1 application topically 2 (two) times daily as needed (rash/ eczema).  11/01/14   [provider]    Allergies    Naproxen, Prednisone, Amlodipine, and Losartan  Review of Systems   Review of Systems  Cardiovascular: Positive for chest pain.  All other systems reviewed and are negative.   Physical Exam Updated Vital Signs BP 128/71   Pulse (!) 51   Temp (!) 97.5 F (36.4 C) (Oral)   Resp 14   SpO2 98%   Physical Exam Vitals and nursing note reviewed.   80 year old female, resting comfortably and in no acute distress. Vital signs are significant for slightly slow heart rate.  Oxygen saturation is 98%, which is normal. Head is normocephalic and atraumatic. PERRLA, EOMI. Oropharynx is clear. Neck is nontender and supple without adenopathy or JVD.  There are no carotid bruits. Back is nontender and there is no CVA tenderness. Lungs are clear without rales, wheezes, or rhonchi. Chest is nontender. Heart has regular rate and rhythm without murmur. Abdomen is soft, flat, nontender without masses or hepatosplenomegaly and peristalsis is normoactive. Extremities have no cyanosis or edema, full range of motion is present. Skin is warm and dry without rash. Neurologic: Awake, alert, oriented.  Speech is normal.  Cranial nerves are intact, there is no facial droop.  Tongue protrudes in the midline.  Sensation is equal throughout the face.  Shoulder shrug is symmetric.  There is no pronator drift.  Strength is 5/5 in all 4 extremities (muscles tested were elbow flexion, elbow extension, hip flexion, plantarflexion, dorsiflexion).  There is no extinction on double simultaneous stimulation.  Sensation is symmetric in both arms and both legs.  ED Results / Procedures / Treatments   Labs (all labs ordered are listed, but only abnormal results are displayed) Labs Reviewed  BASIC METABOLIC PANEL - Abnormal; Notable for the following components:      Result Value   Glucose, Bld 110 (*)    Anion gap 4 (*)    All other components within normal limits  CBC  TROPONIN I (HIGH SENSITIVITY)  TROPONIN I (HIGH SENSITIVITY)    EKG ED ECG REPORT   Date: 09/05/2020  Rate: 66  Rhythm: normal sinus rhythm  QRS Axis: left  Intervals: normal  ST/T Wave abnormalities: normal  Conduction Disutrbances:none  Narrative Interpretation: Left axis deviation, otherwise normal ECG.  When compared with ECG of 07/12/2020, no significant changes are seen.  Old EKG Reviewed: unchanged  I have personally reviewed the EKG tracing and agree with the computerized printout as noted.   Radiology DG  Chest 2 View  Result Date: 09/04/2020 CLINICAL DATA:  Chest pain. EXAM:  CHEST - 2 VIEW COMPARISON:  10/11/2019 FINDINGS: The cardiomediastinal contours are normal. Subsegmental atelectasis in the lung bases. Pulmonary vasculature is normal. No consolidation, pleural effusion, or pneumothorax. No acute osseous abnormalities are seen. IMPRESSION: Subsegmental bibasilar atelectasis. Electronically Signed   By: Keith Rake M.D.   On: 09/04/2020 19:46   CT Head Wo Contrast  Result Date: 09/05/2020 CLINICAL DATA:  TIA EXAM: CT HEAD WITHOUT CONTRAST TECHNIQUE: Contiguous axial images were obtained from the base of the skull through the vertex without intravenous contrast. COMPARISON:  05/26/2020 FINDINGS: Brain: No evidence of acute infarction, hemorrhage, hydrocephalus, extra-axial collection or mass lesion/mass effect. Age normal brain volume and white matter appearance Vascular: No hyperdense vessel or unexpected calcification. Skull: Normal. Negative for fracture or focal lesion. Sinuses/Orbits: No acute finding. IMPRESSION: No acute finding.  Unremarkable study for age. Electronically Signed   By: Monte Fantasia M.D.   On: 09/05/2020 05:17    Procedures Procedures   Medications Ordered in ED Medications  aspirin chewable tablet 324 mg (has no administration in time range)    ED Course  I have reviewed the triage vital signs and the nursing notes.  Pertinent labs & imaging results that were available during my care of the patient were reviewed by me and considered in my medical decision making (see chart for details).  MDM Rules/Calculators/A&P Episode of numbness and left leg weakness concerning for transient ischemic attack.  Possible episode of chest pain which has resolved.  ECG shows no acute changes, chest x-ray was significant only for subsegmental atelectasis.  Labs are essentially normal, including troponin x2.  Patient will be sent for CT of the head, and will need to be admitted  for TIA work-up.  Old records are reviewed, and she had cardiac catheterization April 01, 2018 which showed nonocclusive coronary artery disease (worst obstruction was 40% of the LAD).  CT of the head is unremarkable.  Case is discussed with Dr. Cyd Silence of [facility], who agrees to admit the patient.  Case also discussed with Dr. Leonel Ramsay of neurology service who agrees to see the patient in consultation.  Final Clinical Impression(s) / ED Diagnoses Final diagnoses:  TIA (transient ischemic attack)    Rx / DC Orders ED Discharge Orders    None       Delora Fuel, MD 32/67/12 [phone]

## 2020-09-05 NOTE — H&P (Incomplete)
History and Physical    [name] QAS:[id number] DOB: [birth year] DOA: 09/04/2020  Referring MD/NP/PA: Marvis Repress, MD PCP: Seward Carol, MD  Patient coming from: home  Chief Complaint: Weird feeling on my left side  I have personally briefly reviewed patient's old medical records in [place]   HPI: Sara Brown is a 80 y.o. female with medical history significant of hypertension, hyperlipidemia, nonobstructive CAD, left carotid stenosis, TIA, diabetes mellitus type 2, asthma, vitamin D deficiency, and GERD who with complaints of a weird sensation on her left side.  Symptoms started yesterday afternoon after she reported that her systolic blood pressure was elevated up into the 150s.  Symptoms started on the left side of her face most notably on her lip and tongue. Thereafter noted that her left leg also felt abnormal sensation of something moving in her left leg, but it made her feel as though she could not walk.  Patient now reports sensation may have also occurred in the right leg, but was not as pronounced as on the left.  Denied any headache at that time.  Noted associated symptoms of substernal chest discomfort that she thought may have been related to her asthma and malaise.  She needs she has been intermittently similar symptoms for many years and sometimes will get headache in left temple and posterior of her head.  Patient also reports having intermittent cough, but relates it to postnasal drip she had not taken her Flonase.  Review of records shows patient had been seen by [facility] for mostly right-sided facial paresthesias at that time.  Work-up included EEG and MRI which were negative for any acute abnormalities.  ED Course: Upon admission into the emergency department patient was seen to be afebrile, pulse 51-71 blood pressures 128/71-162/95, and all other vital signs stable.  CT scan of the head without contrast showed no  acute abnormalities.  CBC, BMP, and high-sensitivity troponin x2 were unremarkable. Chest x-ray noted subsegmental atelectasis.  Neurology has been consulted and recommended admission for completion of TIA/stroke work-up.  Patient had been given full dose aspirin.  COVID-19 screening was still pending.   Review of Systems  Constitutional: Positive for malaise/fatigue. Negative for fever.  HENT: Positive for congestion. Negative for sore throat.   Eyes: Negative for photophobia and pain.  Respiratory: Positive for cough. Negative for sputum production.   Cardiovascular: Positive for chest pain. Negative for leg swelling.  Gastrointestinal: Negative for abdominal pain, diarrhea, nausea and vomiting.  Genitourinary: Negative for dysuria and hematuria.  Musculoskeletal: Negative for joint pain and myalgias.  Neurological: Positive for sensory change. Negative for focal weakness and loss of consciousness.  Endo/Heme/Allergies: Negative for environmental allergies. Does not bruise/bleed easily.  Psychiatric/Behavioral: Negative for substance abuse. The patient is not nervous/anxious.     Past Medical History:  Diagnosis Date  . Allergic rhinitis   . Arthritis    "fingers, knees" (02/09/2018)  . Asthma    "stopped taking RX after dr said I don't have this" (02/09/2018)  . Carotid stenosis    ICA(L)  . Complication of anesthesia    "felt the cut w/one of my c-sections" (02/09/2018)  . Constipation   . GERD (gastroesophageal reflux disease)   . Headache    "years ago; before menopause" (02/09/2018)  . Hyperlipidemia   . Hypertension   . Neuropathy    face (R)  . OA (osteoarthritis)   . Osteoporosis   . TIA (transient ischemic attack) ?2005  . Type  II diabetes mellitus ([place])   . Vitamin D deficiency     Past Surgical History:  Procedure Laterality Date  . CATARACT EXTRACTION W/ INTRAOCULAR LENS  IMPLANT, BILATERAL Bilateral   . CESAREAN SECTION  1971; 1975  . INTRAVASCULAR PRESSURE  WIRE/FFR STUDY N/A 04/01/2018   Procedure: INTRAVASCULAR PRESSURE WIRE/FFR STUDY;  Surgeon: Nigel Mormon, MD;  Location: [facility];  Service: Cardiovascular;  Laterality: N/A;  . LEFT HEART CATH AND CORONARY ANGIOGRAPHY N/A 04/01/2018   Procedure: LEFT HEART CATH AND CORONARY ANGIOGRAPHY;  Surgeon: Nigel Mormon, MD;  Location: [facility];  Service: Cardiovascular;  Laterality: N/A;  . TUBAL LIGATION  1975     reports that she has never smoked. She has never used smokeless tobacco. She reports that she does not drink alcohol and does not use drugs.  Allergies  Allergen Reactions  . Naproxen Other (See Comments)    Patient preference  Other reaction(s): upset stomach  . Prednisone Other (See Comments)    Patient Preference  Other reaction(s): upset stomach  . Amlodipine Hives  . Losartan Rash    Family History  Problem Relation Age of Onset  . Diabetes Mellitus II Mother   . Osteoporosis Mother   . Hypertension Mother   . Diabetes Mellitus II Father   . CVA Sister        4  . CAD Sister        4  . Hypertension Sister        4  . Osteoarthritis Sister        4  . Alcoholism Brother   . Breast cancer Cousin 75    Prior to Admission medications   Medication Sig Start Date End Date Taking? Authorizing Provider  ADVAIR DISKUS 100-50 MCG/DOSE AEPB 1 puff 2 (two) times daily. 07/26/20   [provider]  albuterol (PROVENTIL HFA;VENTOLIN HFA) 108 (90 Base) MCG/ACT inhaler Inhale 1 puff into the lungs every 4 (four) hours as needed. 07/23/18   [provider]  alendronate (FOSAMAX) 70 MG tablet Take 1 tablet by mouth once a week. 08/09/18   [provider]  aspirin EC 81 MG tablet Take 81 mg by mouth daily.    [provider]  BREO ELLIPTA 100-25 MCG/INH AEPB Inhale 1 puff into the lungs daily. 07/11/20   [provider]  calcium citrate-vitamin D 500-400 MG-UNIT chewable tablet Chew 1 tablet by mouth 2 (two)  times daily.     [provider]  carboxymethylcellulose (REFRESH PLUS) 0.5 % SOLN Place 1-2 drops into both eyes 3 (three) times daily.    [provider]  Cholecalciferol (VITAMIN D3) 10 MCG (400 UNIT) CAPS Take by mouth daily. 4 daily    [provider]  Clobetasol Prop Emollient Base (CLOBETASOL PROPIONATE E) 0.05 % emollient cream Apply 1 application topically 2 (two) times daily as needed. 12/08/19   Lavonna Monarch, MD  cloNIDine (CATAPRES) 0.1 MG tablet Take 0.1 mg by mouth at bedtime and may repeat dose one time if needed.  10/01/17   [provider]  Coenzyme Q10 (COQ10) 200 MG CAPS Take 200 mg by mouth as needed.     [provider]  cycloSPORINE (RESTASIS) 0.05 % ophthalmic emulsion Place 1 drop into both eyes 2 (two) times daily.     [provider]  fluticasone (FLONASE) 50 MCG/ACT nasal spray Place 1 spray into both nostrils at bedtime as needed for allergies or rhinitis.  [provider]  Fluticasone-Salmeterol (ADVAIR) 100-50 MCG/DOSE AEPB 1 puff 07/26/20   [provider]  Garlic 1937 MG CAPS Take 1 capsule by mouth daily.    [provider]  hydrALAZINE (APRESOLINE) 50 MG tablet Take 2 tablets (100 mg total) by mouth 3 (three) times daily. Patient taking differently: Take 50 mg by mouth 4 (four) times daily. 09/10/19 04/21/20  Patwardhan, Reynold Bowen, MD  isosorbide dinitrate (ISORDIL) 20 MG tablet TAKE 1 TABLET BY MOUTH THREE TIMES A DAY 03/27/20   Patwardhan, Manish J, MD  Magnesium 250 MG TABS Take 1 tablet by mouth daily.    [provider]  Multiple Vitamins-Minerals (MULTIVITAMIN WITH MINERALS) tablet Take 1 tablet by mouth daily. Centrum for Women    [provider]  omeprazole (PRILOSEC OTC) 20 MG tablet Take 20 mg by mouth daily as needed (for reflux).     [provider]  Pitavastatin Calcium (LIVALO) 1 MG TABS Take 1 tablet by mouth daily.    [provider]   polyethylene glycol (MIRALAX / GLYCOLAX) packet Take 17 g by mouth at bedtime.    [provider]  pyridOXINE (VITAMIN B-6) 100 MG tablet Take 100 mg by mouth daily.     [provider]  spironolactone (ALDACTONE) 50 MG tablet Take 50 mg by mouth daily.     [provider]  triamcinolone cream (KENALOG) 0.1 % Apply 1 application topically 2 (two) times daily as needed (rash/ eczema).  11/01/14   [provider]    Physical Exam:  Constitutional: Elderly female currently in no acute distress Vitals:   09/05/20 0255 09/05/20 0400 09/05/20 0430 09/05/20 0545  BP: (!) 146/87 134/81 128/71 139/77  Pulse: (!) 57 (!) 52 (!) 51 (!) 53  Resp: 16 14 14 12   Temp:      TempSrc:      SpO2: 99% 99% 98% 97%   Eyes: PERRL, lids and conjunctivae normal ENMT: Mucous membranes are moist. Posterior pharynx clear of any exudate or lesions.  Neck: normal, supple, no masses, no thyromegaly Respiratory: clear to auscultation bilaterally, no wheezing, no crackles. Normal respiratory effort. No accessory muscle use.  Cardiovascular: Regular rate and rhythm, no murmurs / rubs / gallops. No extremity edema. 2+ pedal pulses. No carotid bruits.  Abdomen: no tenderness, no masses palpated. No hepatosplenomegaly. Bowel sounds positive.  Musculoskeletal: no clubbing / cyanosis. No joint deformity upper and lower extremities. Good ROM, no contractures. Normal muscle tone.  Skin: no rashes, lesions, ulcers. No induration Neurologic: CN 2-12 grossly intact.  Patient reports normal sensation on the left lips and tongue currently.  Speech intact and no upper or lower extremity weakness appreciated. Psychiatric: Normal judgment and insight. Alert and oriented x 3. Normal mood.     Labs on Admission: I have personally reviewed following labs and imaging studies  CBC: Recent Labs  Lab 09/04/20 1839  WBC 6.3  HGB 12.5  HCT 37.3  MCV 89.9  PLT 902   Basic Metabolic Panel: Recent  Labs  Lab 09/04/20 1839  NA 136  K 4.1  CL 103  CO2 29  GLUCOSE 110*  BUN 20  CREATININE 0.92  CALCIUM 9.9   GFR: CrCl cannot be calculated (Unknown ideal weight.). Liver Function Tests: No results for input(s): AST, ALT, ALKPHOS, BILITOT, PROT, ALBUMIN in the last 168 hours. No results for input(s): LIPASE, AMYLASE in the last 168 hours. No results for input(s): AMMONIA in the last 168 hours. Coagulation Profile: No  results for input(s): INR, PROTIME in the last 168 hours. Cardiac Enzymes: No results for input(s): CKTOTAL, CKMB, CKMBINDEX, TROPONINI in the last 168 hours. BNP (last 3 results) No results for input(s): PROBNP in the last 8760 hours. HbA1C: No results for input(s): HGBA1C in the last 72 hours. CBG: No results for input(s): GLUCAP in the last 168 hours. Lipid Profile: No results for input(s): CHOL, HDL, LDLCALC, TRIG, CHOLHDL, LDLDIRECT in the last 72 hours. Thyroid Function Tests: No results for input(s): TSH, T4TOTAL, FREET4, T3FREE, THYROIDAB in the last 72 hours. Anemia Panel: No results for input(s): VITAMINB12, FOLATE, FERRITIN, TIBC, IRON, RETICCTPCT in the last 72 hours. Urine analysis:    Component Value Date/Time   COLORURINE STRAW (A) 05/27/2020 0513   APPEARANCEUR CLEAR 05/27/2020 0513   LABSPEC 1.005 05/27/2020 0513   PHURINE 6.0 05/27/2020 0513   GLUCOSEU NEGATIVE 05/27/2020 0513   HGBUR NEGATIVE 05/27/2020 0513   BILIRUBINUR NEGATIVE 05/27/2020 0513   KETONESUR NEGATIVE 05/27/2020 0513   PROTEINUR NEGATIVE 05/27/2020 0513   UROBILINOGEN 0.2 11/27/2014 0311   NITRITE NEGATIVE 05/27/2020 0513   LEUKOCYTESUR NEGATIVE 05/27/2020 0513   Sepsis Labs: No results found for this or any previous visit (from the past 240 hour(s)).   Radiological Exams on Admission: DG Chest 2 View  Result Date: 09/04/2020 CLINICAL DATA:  Chest pain. EXAM: CHEST - 2 VIEW COMPARISON:  10/11/2019 FINDINGS: The cardiomediastinal contours are normal. Subsegmental  atelectasis in the lung bases. Pulmonary vasculature is normal. No consolidation, pleural effusion, or pneumothorax. No acute osseous abnormalities are seen. IMPRESSION: Subsegmental bibasilar atelectasis. Electronically Signed   By: Keith Rake M.D.   On: 09/04/2020 19:46   CT Head Wo Contrast  Result Date: 09/05/2020 CLINICAL DATA:  TIA EXAM: CT HEAD WITHOUT CONTRAST TECHNIQUE: Contiguous axial images were obtained from the base of the skull through the vertex without intravenous contrast. COMPARISON:  05/26/2020 FINDINGS: Brain: No evidence of acute infarction, hemorrhage, hydrocephalus, extra-axial collection or mass lesion/mass effect. Age normal brain volume and white matter appearance Vascular: No hyperdense vessel or unexpected calcification. Skull: Normal. Negative for fracture or focal lesion. Sinuses/Orbits: No acute finding. IMPRESSION: No acute finding.  Unremarkable study for age. Electronically Signed   By: Monte Fantasia M.D.   On: 09/05/2020 05:17    EKG: Independently reviewed.  Normal sinus rhythm at 66 bpm with left axis deviation  Assessment/Plan Left-sided paresthesia: Acute on chronic.  Patient reports having weird sensation on the left side of face and left leg started yesterday afternoon.  Symptoms initially resolved, but currently reporting frequent symptoms.  Records note prior negative work-up for right-sided facial paresthesias back in 2016.  Neurology evaluated patient and currently question the possibility of migraine vs. seizure vs. less likely TIA/stroke. -Admit to a medical telemetry bed -Neurochecks -Follow-up MRI brain along with MRA of the head and neck -Follow-up EEG -Check magnesium and TSH -Continue aspirin -PT/OT to eval -Appreciate neurology consultative services, we will follow-up for any further recommendations  Essential hypertension: Blood pressures currently stable. -Continue home medication regimen if MRI negative  Chest discomfort  CAD:  Patient was reported to complain of chest discomfort, but currently denies any complaints and thought symptoms may have been related to her as.  EKG appeared to be nonischemic and high-sensitivity troponins were negative x2.  Last cardiac cath 2019 showed nonocclusive coronary artery disease with 40% obstruction noted of the LAD. -Continue aspirin and statin  Sinus bradycardia: Heart rates were noted to be as low as 51  bpm on admission. -Follow-up telemetry overnight  Diabetes mellitus type 2, controlled: Last available hemoglobin A1c was 6.2 in 03/2018.  Patient does not appear to be on any medication for treatment. -Add on a hemoglobin A1c  Mild intermittent asthma: Lungs sound clear at this time without wheeze.  Patient maintaining O2 saturations on room air. -Albuterol as needed for shortness of breath/wheeze  Hyperlipidemia -Continue statin  GERD -Continue PPI  DVT prophylaxis: Lovenox   Code Status: Full Family Communication: husband updatded over the phone Disposition Plan: likely discharge home if work-up Consults called: Neurology Admission status: Observation  Norval Morton MD [facility]   If 7PM-7AM, please contact night-coverage   09/05/2020, 7:04 AM

## 2020-09-05 NOTE — Progress Notes (Signed)
Routine EEG complete. Results pending.

## 2020-09-05 NOTE — H&P (Addendum)
History and Physical    [name] SFK:[id number] DOB: [birth year] DOA: 09/04/2020  Referring MD/NP/PA: Marvis Repress, MD PCP: Seward Carol, MD  Patient coming from: home  Chief Complaint: Weird feeling on my left side  I have personally briefly reviewed patient's old medical records in [place]   HPI: Sara Brown is a 80 y.o. female with medical history significant of hypertension, hyperlipidemia, nonobstructive CAD, left carotid stenosis, TIA, diabetes mellitus type 2, asthma, vitamin D deficiency, and GERD who with complaints of a weird sensation on her left side.  Symptoms started yesterday afternoon after she reported that her systolic blood pressure was elevated up into the 150s.  Symptoms started on the left side of her face most notably on her lip and tongue. Thereafter noted that her left leg also felt abnormal sensation of something moving in her left leg, but it made her feel as though she could not walk.  Patient now reports sensation may have also occurred in the right leg, but was not as pronounced as on the left.  Denied any headache at that time.  Noted associated symptoms of substernal chest discomfort that she thought may have been related to her asthma and malaise.  She needs she has been intermittently similar symptoms for many years and sometimes will get headache in left temple and posterior of her head.  Patient also reports having intermittent cough, but relates it to postnasal drip she had not taken her Flonase.  Review of records shows patient had been seen by [facility] for mostly right-sided facial paresthesias at that time.  Work-up included EEG and MRI which were negative for any acute abnormalities.  ED Course: Upon admission into the emergency department patient was seen to be afebrile, pulse 51-71 blood pressures 128/71-162/95, and all other vital signs stable.  CT scan of the head without contrast showed no  acute abnormalities.  CBC, BMP, and high-sensitivity troponin x2 were unremarkable. Chest x-ray noted subsegmental atelectasis.  Neurology has been consulted and recommended admission for completion of TIA/stroke work-up.  Patient had been given full dose aspirin.  COVID-19 screening was still pending.   Review of Systems  Constitutional: Positive for malaise/fatigue. Negative for fever.  HENT: Positive for congestion. Negative for sore throat.   Eyes: Negative for photophobia and pain.  Respiratory: Positive for cough. Negative for sputum production.   Cardiovascular: Positive for chest pain. Negative for leg swelling.  Gastrointestinal: Negative for abdominal pain, diarrhea, nausea and vomiting.  Genitourinary: Negative for dysuria and hematuria.  Musculoskeletal: Negative for joint pain and myalgias.  Neurological: Positive for sensory change. Negative for focal weakness and loss of consciousness.  Endo/Heme/Allergies: Negative for environmental allergies. Does not bruise/bleed easily.  Psychiatric/Behavioral: Negative for substance abuse. The patient is not nervous/anxious.     Past Medical History:  Diagnosis Date  . Allergic rhinitis   . Arthritis    "fingers, knees" (02/09/2018)  . Asthma    "stopped taking RX after dr said I don't have this" (02/09/2018)  . Carotid stenosis    ICA(L)  . Complication of anesthesia    "felt the cut w/one of my c-sections" (02/09/2018)  . Constipation   . GERD (gastroesophageal reflux disease)   . Headache    "years ago; before menopause" (02/09/2018)  . Hyperlipidemia   . Hypertension   . Neuropathy    face (R)  . OA (osteoarthritis)   . Osteoporosis   . TIA (transient ischemic attack) ?2005  . Type  II diabetes mellitus ([place])   . Vitamin D deficiency     Past Surgical History:  Procedure Laterality Date  . CATARACT EXTRACTION W/ INTRAOCULAR LENS  IMPLANT, BILATERAL Bilateral   . CESAREAN SECTION  1971; 1975  . INTRAVASCULAR PRESSURE  WIRE/FFR STUDY N/A 04/01/2018   Procedure: INTRAVASCULAR PRESSURE WIRE/FFR STUDY;  Surgeon: Nigel Mormon, MD;  Location: [facility];  Service: Cardiovascular;  Laterality: N/A;  . LEFT HEART CATH AND CORONARY ANGIOGRAPHY N/A 04/01/2018   Procedure: LEFT HEART CATH AND CORONARY ANGIOGRAPHY;  Surgeon: Nigel Mormon, MD;  Location: [facility];  Service: Cardiovascular;  Laterality: N/A;  . TUBAL LIGATION  1975     reports that she has never smoked. She has never used smokeless tobacco. She reports that she does not drink alcohol and does not use drugs.  Allergies  Allergen Reactions  . Naproxen Other (See Comments)    Patient preference  Other reaction(s): upset stomach  . Prednisone Other (See Comments)    Patient Preference  Other reaction(s): upset stomach  . Amlodipine Hives  . Losartan Rash    Family History  Problem Relation Age of Onset  . Diabetes Mellitus II Mother   . Osteoporosis Mother   . Hypertension Mother   . Diabetes Mellitus II Father   . CVA Sister        4  . CAD Sister        4  . Hypertension Sister        4  . Osteoarthritis Sister        4  . Alcoholism Brother   . Breast cancer Cousin 75    Prior to Admission medications   Medication Sig Start Date End Date Taking? Authorizing Provider  ADVAIR DISKUS 100-50 MCG/DOSE AEPB 1 puff 2 (two) times daily. 07/26/20   [provider]  albuterol (PROVENTIL HFA;VENTOLIN HFA) 108 (90 Base) MCG/ACT inhaler Inhale 1 puff into the lungs every 4 (four) hours as needed. 07/23/18   [provider]  alendronate (FOSAMAX) 70 MG tablet Take 1 tablet by mouth once a week. 08/09/18   [provider]  aspirin EC 81 MG tablet Take 81 mg by mouth daily.    [provider]  BREO ELLIPTA 100-25 MCG/INH AEPB Inhale 1 puff into the lungs daily. 07/11/20   [provider]  calcium citrate-vitamin D 500-400 MG-UNIT chewable tablet Chew 1 tablet by mouth 2 (two)  times daily.     [provider]  carboxymethylcellulose (REFRESH PLUS) 0.5 % SOLN Place 1-2 drops into both eyes 3 (three) times daily.    [provider]  Cholecalciferol (VITAMIN D3) 10 MCG (400 UNIT) CAPS Take by mouth daily. 4 daily    [provider]  Clobetasol Prop Emollient Base (CLOBETASOL PROPIONATE E) 0.05 % emollient cream Apply 1 application topically 2 (two) times daily as needed. 12/08/19   Lavonna Monarch, MD  cloNIDine (CATAPRES) 0.1 MG tablet Take 0.1 mg by mouth at bedtime and may repeat dose one time if needed.  10/01/17   [provider]  Coenzyme Q10 (COQ10) 200 MG CAPS Take 200 mg by mouth as needed.     [provider]  cycloSPORINE (RESTASIS) 0.05 % ophthalmic emulsion Place 1 drop into both eyes 2 (two) times daily.     [provider]  fluticasone (FLONASE) 50 MCG/ACT nasal spray Place 1 spray into both nostrils at bedtime as needed for allergies or rhinitis.  [provider]  Fluticasone-Salmeterol (ADVAIR) 100-50 MCG/DOSE AEPB 1 puff 07/26/20   [provider]  Garlic 9326 MG CAPS Take 1 capsule by mouth daily.    [provider]  hydrALAZINE (APRESOLINE) 50 MG tablet Take 2 tablets (100 mg total) by mouth 3 (three) times daily. Patient taking differently: Take 50 mg by mouth 4 (four) times daily. 09/10/19 04/21/20  Patwardhan, Reynold Bowen, MD  isosorbide dinitrate (ISORDIL) 20 MG tablet TAKE 1 TABLET BY MOUTH THREE TIMES A DAY 03/27/20   Patwardhan, Manish J, MD  Magnesium 250 MG TABS Take 1 tablet by mouth daily.    [provider]  Multiple Vitamins-Minerals (MULTIVITAMIN WITH MINERALS) tablet Take 1 tablet by mouth daily. Centrum for Women    [provider]  omeprazole (PRILOSEC OTC) 20 MG tablet Take 20 mg by mouth daily as needed (for reflux).     [provider]  Pitavastatin Calcium (LIVALO) 1 MG TABS Take 1 tablet by mouth daily.    [provider]   polyethylene glycol (MIRALAX / GLYCOLAX) packet Take 17 g by mouth at bedtime.    [provider]  pyridOXINE (VITAMIN B-6) 100 MG tablet Take 100 mg by mouth daily.     [provider]  spironolactone (ALDACTONE) 50 MG tablet Take 50 mg by mouth daily.     [provider]  triamcinolone cream (KENALOG) 0.1 % Apply 1 application topically 2 (two) times daily as needed (rash/ eczema).  11/01/14   [provider]    Physical Exam:  Constitutional: Elderly female currently in no acute distress Vitals:   09/05/20 0255 09/05/20 0400 09/05/20 0430 09/05/20 0545  BP: (!) 146/87 134/81 128/71 139/77  Pulse: (!) 57 (!) 52 (!) 51 (!) 53  Resp: 16 14 14 12   Temp:      TempSrc:      SpO2: 99% 99% 98% 97%   Eyes: PERRL, lids and conjunctivae normal ENMT: Mucous membranes are moist. Posterior pharynx clear of any exudate or lesions.  Neck: normal, supple, no masses, no thyromegaly Respiratory: clear to auscultation bilaterally, no wheezing, no crackles. Normal respiratory effort. No accessory muscle use.  Cardiovascular: Regular rate and rhythm, no murmurs / rubs / gallops. No extremity edema. 2+ pedal pulses. No carotid bruits.  Abdomen: no tenderness, no masses palpated. No hepatosplenomegaly. Bowel sounds positive.  Musculoskeletal: no clubbing / cyanosis. No joint deformity upper and lower extremities. Good ROM, no contractures. Normal muscle tone.  Skin: no rashes, lesions, ulcers. No induration Neurologic: CN 2-12 grossly intact.  Patient reports normal sensation on the left lips and tongue currently.  Speech intact and no upper or lower extremity weakness appreciated. Psychiatric: Normal judgment and insight. Alert and oriented x 3. Normal mood.     Labs on Admission: I have personally reviewed following labs and imaging studies  CBC: Recent Labs  Lab 09/04/20 1839  WBC 6.3  HGB 12.5  HCT 37.3  MCV 89.9  PLT 712   Basic Metabolic Panel: Recent  Labs  Lab 09/04/20 1839  NA 136  K 4.1  CL 103  CO2 29  GLUCOSE 110*  BUN 20  CREATININE 0.92  CALCIUM 9.9   GFR: CrCl cannot be calculated (Unknown ideal weight.). Liver Function Tests: No results for input(s): AST, ALT, ALKPHOS, BILITOT, PROT, ALBUMIN in the last 168 hours. No results for input(s): LIPASE, AMYLASE in the last 168 hours. No results for input(s): AMMONIA in the last 168 hours. Coagulation Profile: No  results for input(s): INR, PROTIME in the last 168 hours. Cardiac Enzymes: No results for input(s): CKTOTAL, CKMB, CKMBINDEX, TROPONINI in the last 168 hours. BNP (last 3 results) No results for input(s): PROBNP in the last 8760 hours. HbA1C: No results for input(s): HGBA1C in the last 72 hours. CBG: No results for input(s): GLUCAP in the last 168 hours. Lipid Profile: No results for input(s): CHOL, HDL, LDLCALC, TRIG, CHOLHDL, LDLDIRECT in the last 72 hours. Thyroid Function Tests: No results for input(s): TSH, T4TOTAL, FREET4, T3FREE, THYROIDAB in the last 72 hours. Anemia Panel: No results for input(s): VITAMINB12, FOLATE, FERRITIN, TIBC, IRON, RETICCTPCT in the last 72 hours. Urine analysis:    Component Value Date/Time   COLORURINE STRAW (A) 05/27/2020 0513   APPEARANCEUR CLEAR 05/27/2020 0513   LABSPEC 1.005 05/27/2020 0513   PHURINE 6.0 05/27/2020 0513   GLUCOSEU NEGATIVE 05/27/2020 0513   HGBUR NEGATIVE 05/27/2020 0513   BILIRUBINUR NEGATIVE 05/27/2020 0513   KETONESUR NEGATIVE 05/27/2020 0513   PROTEINUR NEGATIVE 05/27/2020 0513   UROBILINOGEN 0.2 11/27/2014 0311   NITRITE NEGATIVE 05/27/2020 0513   LEUKOCYTESUR NEGATIVE 05/27/2020 0513   Sepsis Labs: No results found for this or any previous visit (from the past 240 hour(s)).   Radiological Exams on Admission: DG Chest 2 View  Result Date: 09/04/2020 CLINICAL DATA:  Chest pain. EXAM: CHEST - 2 VIEW COMPARISON:  10/11/2019 FINDINGS: The cardiomediastinal contours are normal. Subsegmental  atelectasis in the lung bases. Pulmonary vasculature is normal. No consolidation, pleural effusion, or pneumothorax. No acute osseous abnormalities are seen. IMPRESSION: Subsegmental bibasilar atelectasis. Electronically Signed   By: Keith Rake M.D.   On: 09/04/2020 19:46   CT Head Wo Contrast  Result Date: 09/05/2020 CLINICAL DATA:  TIA EXAM: CT HEAD WITHOUT CONTRAST TECHNIQUE: Contiguous axial images were obtained from the base of the skull through the vertex without intravenous contrast. COMPARISON:  05/26/2020 FINDINGS: Brain: No evidence of acute infarction, hemorrhage, hydrocephalus, extra-axial collection or mass lesion/mass effect. Age normal brain volume and white matter appearance Vascular: No hyperdense vessel or unexpected calcification. Skull: Normal. Negative for fracture or focal lesion. Sinuses/Orbits: No acute finding. IMPRESSION: No acute finding.  Unremarkable study for age. Electronically Signed   By: Monte Fantasia M.D.   On: 09/05/2020 05:17    EKG: Independently reviewed.  Normal sinus rhythm at 66 bpm with left axis deviation  Assessment/Plan Left-sided paresthesia: Acute on chronic.  Patient reports having weird sensation on the left side of face and left leg started yesterday afternoon.  Symptoms initially resolved, but currently reporting frequent symptoms.  Records note prior negative work-up for right-sided facial paresthesias back in 2016.  Neurology evaluated patient and currently question the possibility of migraine vs. seizure vs. less likely TIA/stroke. -Admit to a medical telemetry bed -Neurochecks -Follow-up MRI brain along with MRA of the head and neck -Follow-up EEG -Check magnesium and TSH -Continue aspirin -PT/OT to eval -Appreciate neurology consultative services, we will follow-up for any further recommendations  Essential hypertension: Blood pressures currently stable. -Continue home medication regimen if MRI negative  Chest discomfort  CAD:  Patient was reported to complain of chest discomfort, but currently denies any complaints and thought symptoms may have been related to her as.  EKG appeared to be nonischemic and high-sensitivity troponins were negative x2.  Last cardiac cath 2019 showed nonocclusive coronary artery disease with 40% obstruction noted of the LAD. -Continue aspirin and statin  Sinus bradycardia: Heart rates were noted to be as low as 51  bpm on admission. -Follow-up telemetry overnight  Diabetes mellitus type 2, controlled: Last available hemoglobin A1c was 6.2 in 03/2018.  Patient does not appear to be on any medication for treatment. -Add on a hemoglobin A1c  Mild intermittent asthma: Lungs sound clear at this time without wheeze.  Patient maintaining O2 saturations on room air. -Pharmacy substitution for Advair -Albuterol as needed for shortness of breath/wheeze  Hyperlipidemia -Continue statin  GERD -Continue PPI  DVT prophylaxis: Lovenox   Code Status: Full Family Communication: none Disposition Plan: likely discharge home if work-up Consults called: Neurology Admission status: Observation  Norval Morton MD [facility]   If 7PM-7AM, please contact night-coverage   09/05/2020, 7:04 AM

## 2020-09-05 NOTE — Consult Note (Signed)
Neurology Consultation Reason for Consult: Left sided ? paresthesias Referring Physician: Roxanne Mins, D  CC: Weird feeling on the left  History is obtained from: Patient  HPI: Sara Brown is a 80 y.o. female who has recurrent episodes that have been going on for years and occurring at least weekly of left facial paresthesia.  She states that the first thing that she notices will be her lip and tongue start feeling abnormal, and then the whole side of her face feels "abnormal."  This is sometimes associated with a headache as well, which is holocephalic and not associated with photophobia.  She associates the sensation with her blood pressure being high.  It will sometimes involve the left side and sometimes the right side.  She has been worked up for this in the past with EEG/MRI which were negative in 2016.  Due to being concerned about her blood pressure she called her PCPs office, and when she described the symptoms that she was having she was told to present to the emergency department.  ROS: A 14 point ROS was performed and is negative except as noted in the HPI.   Past Medical History:  Diagnosis Date  . Allergic rhinitis   . Arthritis    "fingers, knees" (02/09/2018)  . Asthma    "stopped taking RX after dr said I don't have this" (02/09/2018)  . Carotid stenosis    ICA(L)  . Complication of anesthesia    "felt the cut w/one of my c-sections" (02/09/2018)  . Constipation   . GERD (gastroesophageal reflux disease)   . Headache    "years ago; before menopause" (02/09/2018)  . Hyperlipidemia   . Hypertension   . Neuropathy    face (R)  . OA (osteoarthritis)   . Osteoporosis   . TIA (transient ischemic attack) ?2005  . Type II diabetes mellitus ([place])   . Vitamin D deficiency      Family History  Problem Relation Age of Onset  . Diabetes Mellitus II Mother   . Osteoporosis Mother   . Hypertension Mother   . Diabetes Mellitus II Father   . CVA Sister        4  . CAD  Sister        4  . Hypertension Sister        4  . Osteoarthritis Sister        4  . Alcoholism Brother   . Breast cancer Cousin 15     Social History:  reports that she has never smoked. She has never used smokeless tobacco. She reports that she does not drink alcohol and does not use drugs.   Exam: Current vital signs: BP 139/77   Pulse (!) 53   Temp (!) 97.5 F (36.4 C) (Oral)   Resp 12   SpO2 97%  Vital signs in last 24 hours: Temp:  [97.5 F (36.4 C)-98.2 F (36.8 C)] 97.5 F (36.4 C) (03/28 2358) Pulse Rate:  [51-71] 53 (03/29 0545) Resp:  [12-17] 12 (03/29 0545) BP: (128-162)/(69-95) 139/77 (03/29 0545) SpO2:  [95 %-100 %] 97 % (03/29 0545)   Physical Exam  Constitutional: Appears well-developed and well-nourished.  Psych: Affect appropriate to situation Eyes: No scleral injection HENT: No OP obstruction MSK: no joint deformities.  Cardiovascular: Normal rate and regular rhythm.  Respiratory: Effort normal, non-labored breathing GI: Soft.  No distension. There is no tenderness.  Skin: WDI  Neuro: Mental Status: Patient is awake, alert, oriented to person, place, month, year,  and situation. Patient is able to give a clear and coherent history. No signs of aphasia or neglect Cranial Nerves: II: Visual Fields are full. Pupils are equal, round, and reactive to light.   III,IV, VI: EOMI without ptosis or diploplia.  V: Facial sensation is symmetric to temperature VII: Facial movement is symmetric.  VIII: hearing is intact to voice X: Uvula elevates symmetrically XI: Shoulder shrug is symmetric. XII: tongue is midline without atrophy or fasciculations.  Motor: Tone is normal. Bulk is normal. 5/5 strength was present in all four extremities.  Sensory: Sensation is symmetric to light touch and temperature in the arms and legs. Cerebellar: FNF  intact bilaterally   I have reviewed labs in epic and the results pertinent to this consultation  are: Creatinine 1.19  I have reviewed the images obtained: CT head-negative  Impression: 80 year old female with recurrent stereotyped spells occurring at least weekly for many years.  I think that TIA is extremely unlikely given the duration of the symptoms.  She has been worked up for it in the past, with negative results.  Possible etiologies include migraine(which I think is actually most likely), seizure, or much less likely recurrent TIA.  I would not treated as TIA unless MRI reveals evidence of ischemia.  Recommendations: 1) MRI/MRA head and neck 2) EEG 3) continue antiplatelet therapy with aspirin 81 mg 4) if MRI and EEG are negative, no further recommendations at this time.   Roland Rack, MD [facility] [phone]  If 7pm- 7am, please page neurology on call as listed in [place].

## 2020-09-05 NOTE — ED Notes (Signed)
Called to given report; RN to call back in 5 min

## 2020-09-05 NOTE — ED Notes (Signed)
Pt to MRI with personal belongings.

## 2020-09-05 NOTE — Procedures (Signed)
Telespecialist EEG Report  EEG Date 09/05/20  Duration: 22.35  Clinical Indication: seizure r/o epileptiform discharges  EEG Procedure: This is a digitally recorded routine electroencephalogram. The international 10-20 electrode placement system is used for scalp electrode placement. Eighteen channels of scalp EEG are recorded The data are stored digitally and reviewed in reformatted montages for optimal display.  EEG Description: This EEG is well organized. There was a well formed, well sustained and symmetrical 9-10hz  alpha rhythm that attenuated with eye opening and drowsiness. The background consisted of a mixture of alpha and beta frequencies of medium voltage.  During the brief period of drowsiness, background slowing appeared.  Stage 2 sleep was not recorded. Rare intermittent polymorphic delta slowing was present in the left temporal region.  There were no epileptiform discharges.  Activating procedures: These were not performed.  EKG: The EKG rhythm strip showed sinus bradycardia down to 54bpm.  EEG Classification: Abnormal 1.  Intermittent polymorphic delta slowing, left, temporal  EEG Interpretation: This routine EEG recorded in the awake and drowsy states is abnormal. The focal slowing present in the left temporal region suggest focal cerebral dysfunction in this region.  There were no epileptiform discharges present.

## 2020-09-05 NOTE — ED Notes (Signed)
NA for roll call

## 2020-09-06 ENCOUNTER — Observation Stay (HOSPITAL_BASED_OUTPATIENT_CLINIC_OR_DEPARTMENT_OTHER): Payer: Medicare Other

## 2020-09-06 DIAGNOSIS — E7800 Pure hypercholesterolemia, unspecified: Secondary | ICD-10-CM | POA: Diagnosis not present

## 2020-09-06 DIAGNOSIS — M79662 Pain in left lower leg: Secondary | ICD-10-CM | POA: Diagnosis not present

## 2020-09-06 DIAGNOSIS — R079 Chest pain, unspecified: Secondary | ICD-10-CM | POA: Diagnosis not present

## 2020-09-06 DIAGNOSIS — M79661 Pain in right lower leg: Secondary | ICD-10-CM | POA: Diagnosis not present

## 2020-09-06 DIAGNOSIS — E118 Type 2 diabetes mellitus with unspecified complications: Secondary | ICD-10-CM | POA: Diagnosis not present

## 2020-09-06 DIAGNOSIS — I10 Essential (primary) hypertension: Secondary | ICD-10-CM | POA: Diagnosis not present

## 2020-09-06 DIAGNOSIS — K219 Gastro-esophageal reflux disease without esophagitis: Secondary | ICD-10-CM | POA: Diagnosis not present

## 2020-09-06 DIAGNOSIS — R202 Paresthesia of skin: Secondary | ICD-10-CM | POA: Diagnosis not present

## 2020-09-06 DIAGNOSIS — I2510 Atherosclerotic heart disease of native coronary artery without angina pectoris: Secondary | ICD-10-CM | POA: Diagnosis not present

## 2020-09-06 LAB — COMPREHENSIVE METABOLIC PANEL
ALT: 20 U/L (ref 0–44)
AST: 22 U/L (ref 15–41)
Albumin: 3.6 g/dL (ref 3.5–5.0)
Alkaline Phosphatase: 39 U/L (ref 38–126)
Anion gap: 3 — ABNORMAL LOW (ref 5–15)
BUN: 13 mg/dL (ref 8–23)
CO2: 26 mmol/L (ref 22–32)
Calcium: 9 mg/dL (ref 8.9–10.3)
Chloride: 109 mmol/L (ref 98–111)
Creatinine, Ser: 0.83 mg/dL (ref 0.44–1.00)
GFR, Estimated: >60 mL/min (ref >60)
Glucose, Bld: 101 mg/dL — ABNORMAL HIGH (ref 70–99)
Potassium: 3.9 mmol/L (ref 3.5–5.1)
Sodium: 138 mmol/L (ref 135–145)
Total Bilirubin: 0.8 mg/dL (ref 0.3–1.2)
Total Protein: 6.4 g/dL — ABNORMAL LOW (ref 6.5–8.1)

## 2020-09-06 MED ORDER — LEVETIRACETAM 500 MG PO TABS: 500.0000 mg | ORAL_TABLET | Freq: Two times a day (BID) | ORAL | 1 refills | Status: DC

## 2020-09-06 NOTE — Progress Notes (Signed)
Discharge instructions reviewed and given to pt. Pt states she takes hydralazine 50 mg tid. Discharge instructions are for hydralazine 100 mg tid. Pt instructed to  f/u with prescribing md for clarification. Pt acknowledge understanding of all discharge instructions

## 2020-09-06 NOTE — Discharge Summary (Signed)
Physician Discharge Summary  Sara Brown [facility] EPP:[id number] DOB: [birth year] DOA: 09/04/2020  PCP: Seward Carol, MD  Admit date: 09/04/2020 Discharge date: 09/06/2020  Time spent: 45 minutes  Recommendations for Outpatient Follow-up:  Patient will be discharged to home with outpatient physical therapy.  Patient will need to follow up with primary care provider within one week of discharge.  Follow-up with outpatient neurology, Dr. Rexene Alberts in 2-4 weeks.  Patient should continue medications as prescribed.  Patient should follow a heart healthy/carb modified diet.   Discharge Diagnoses:  Left sided paresthesia, acute on chronic/ Possible seizure Essential hypertension Chest discomfort/CAD Bradycardia Diabetes mellitus, type II Hyperlipidemia GERD Mild intermittent asthma Lower extremity knee and leg pain  Discharge Condition: Stable  Diet recommendation: Heart healthy/carb modified  There were no vitals filed for this visit.  History of present illness:  On 09/05/2020 by Dr. Fuller Plan Sara Brown is a 80 y.o. female with medical history significant of hypertension, hyperlipidemia, nonobstructive CAD, left carotid stenosis, TIA, diabetes mellitus type 2, asthma, vitamin D deficiency, and GERD who with complaints of a weird sensation on her left side.  Symptoms started yesterday afternoon after she reported that her systolic blood pressure was elevated up into the 150s.  Symptoms started on the left side of her face most notably on her lip and tongue. Thereafter noted that her left leg also felt abnormal sensation of something moving in her left leg, but it made her feel as though she could not walk.  Patient now reports sensation may have also occurred in the right leg, but was not as pronounced as on the left.  Denied any headache at that time.  Noted associated symptoms of substernal chest discomfort that she thought may have been related to her asthma and malaise.  She  needs she has been intermittently similar symptoms for many years and sometimes will get headache in left temple and posterior of her head.  Patient also reports having intermittent cough, but relates it to postnasal drip she had not taken her Flonase.  Review of records shows patient had been seen by [facility] for mostly right-sided facial paresthesias at that time.  Work-up included EEG and MRI which were negative for any acute abnormalities.  Hospital Course:  Left sided paresthesia, acute on chronic/ Possible seizure -Patient presented with a weird sensation on the left side of her face as well as leg.  Symptoms had initially resolved but she states that she is still having some strange feeling in her lips and tongue.  She also states that she has had some of these strange feelings on the right side of her face. -Per chart review, patient was having similar symptoms on the right side of her face back in 2016 in which work-up was unremarkable -Neurology consulted and appreciated thought that this could be possibility of migraine versus seizure versus TIA (although TIA less likely) -CT head showed no acute finding -MRI brain showed no evidence of acute intercranial abnormality, no acute infarct. -MRA showed no emergent large vessel occlusion or evidence of proximal hemodynamically significant stenosis in the head or neck -EEG was abnormal showing focal slowing present in the left temporal region suggesting focal cerebral dysfunction in this region.  No elective form discharges present. -Discussed with neurology, patient was started on Keppra and should follow up with her neurologist as an outpatient.  Patient is allowed to drive for 6 months -PT recommended outpatient physical therapy -OT, no further needs  Essential hypertension -Continue  home medications  Chest discomfort/CAD -On admission, patient had complained of chest discomfort however denies any this  morning -EKG as well as high-sensitivity troponins unremarkable -Last catheterization in 2019 showed nonocclusive coronary artery disease with 40% obstruction noted in the LAD -Continue aspirin, statin  Bradycardia -Patient asymptomatic  Diabetes mellitus, type II -Hemoglobin A1c 6.4 -Patient does not appear to be on any medication for diabetes at this time-possibly diet controlled  Hyperlipidemia -Continue statin  GERD -Continue PPI   Mild intermittent asthma -Stable, currently on room air.  No wheezing noted on exam -Continue home medications   Lower extremity knee and leg pain -Patient has been having lower extremity and knee pain for quite some time. -Told physical therapy that she was experiencing pulsing type feeling in her lower extremities particular her gastrocs. -Lower extremity Doppler unremarkable for DVT  Procedures: EEG Lower extremity doppler  Consultations: Neurology  Discharge Exam: Vitals:   09/06/20 1219 09/06/20 1516  BP: 109/69 124/70  Pulse: 76 87  Resp: 16   Temp: 97.8 F (36.6 C)   SpO2: 98%      General: Well developed, elderly, NAD  HEENT: NCAT, mucous membranes moist  Cardiovascular: S1 S2 auscultated, RRR, no murmur  Respiratory: Clear to auscultation bilaterally with equal chest rise  Abdomen: Soft, nontender, nondistended, + bowel sounds  Extremities: warm dry without cyanosis clubbing or edema  Neuro: AAOx3, nonfocal  Psych: Appropriate mood and affect, pleasant   Discharge Instructions Discharge Instructions    Ambulatory referral to Physical Therapy   Complete by: As directed    Iontophoresis - 4 mg/ml of dexamethasone: No   T.E.N.S. Unit Evaluation and Dispense as Indicated: No   Discharge instructions   Complete by: As directed    Patient will be discharged to home with outpatient physical therapy.  Patient will need to follow up with primary care provider within one week of discharge.  Follow-up with outpatient  neurology, Dr. Rexene Alberts in 2-4 weeks.  Patient should continue medications as prescribed.  Patient should follow a heart healthy/carb modified diet.   Do not drive for 6 months or until cleared by your neurologist.   Increase activity slowly   Complete by: As directed      Allergies as of 09/06/2020      Reactions   Naproxen Other (See Comments)   Patient preference  Other reaction(s): upset stomach   Prednisone Other (See Comments)   Patient Preference  Other reaction(s): upset stomach   Amlodipine Hives   Losartan Rash      Medication List    TAKE these medications   Advair Diskus 100-50 MCG/DOSE Aepb Generic drug: Fluticasone-Salmeterol Inhale 1 puff into the lungs 2 (two) times daily.   albuterol 108 (90 Base) MCG/ACT inhaler Commonly known as: VENTOLIN HFA Inhale 1 puff into the lungs every 4 (four) hours as needed for wheezing.   alendronate 70 MG tablet Commonly known as: FOSAMAX Take 70 mg by mouth every Saturday.   aspirin EC 81 MG tablet Take 81 mg by mouth daily.   Calcium Citrate-Vitamin D 315-250 MG-UNIT Tabs Take 2 tablets by mouth daily.   carboxymethylcellulose 0.5 % Soln Commonly known as: REFRESH PLUS Place 1-2 drops into both eyes 3 (three) times daily.   Clobetasol Prop Emollient Base 0.05 % emollient cream Commonly known as: Clobetasol Propionate E Apply 1 application topically 2 (two) times daily as needed. What changed: reasons to take this   cloNIDine 0.1 MG tablet Commonly known as: CATAPRES Take 0.1  mg by mouth See admin instructions. Take 1 tablet by mouth every night at bedtime. May take additional 1 tablet if blood pressure is high   CoQ10 200 MG Caps Take 200 mg by mouth daily.   cycloSPORINE 0.05 % ophthalmic emulsion Commonly known as: RESTASIS Place 1 drop into both eyes 2 (two) times daily.   fluticasone 50 MCG/ACT nasal spray Commonly known as: FLONASE Place 1 spray into both nostrils at bedtime as needed for allergies or  rhinitis.   Garlic 6659 MG Caps Take 1,000 mg by mouth daily.   hydrALAZINE 50 MG tablet Commonly known as: APRESOLINE Take 2 tablets (100 mg total) by mouth 3 (three) times daily. What changed:   how much to take  when to take this  additional instructions   isosorbide dinitrate 20 MG tablet Commonly known as: ISORDIL TAKE 1 TABLET BY MOUTH THREE TIMES A DAY   levETIRAcetam 500 MG tablet Commonly known as: KEPPRA Take 1 tablet (500 mg total) by mouth every 12 (twelve) hours.   Livalo 1 MG Tabs Generic drug: Pitavastatin Calcium Take 1 mg by mouth daily.   Magnesium 250 MG Tabs Take 250 mg by mouth daily.   multivitamin with minerals tablet Take 1 tablet by mouth daily. Centrum for Women   omeprazole 20 MG tablet Commonly known as: PRILOSEC OTC Take 20 mg by mouth daily as needed (for reflux).   polyethylene glycol 17 g packet Commonly known as: MIRALAX / GLYCOLAX Take 17 g by mouth at bedtime.   pyridOXINE 100 MG tablet Commonly known as: VITAMIN B-6 Take 100 mg by mouth daily.   spironolactone 50 MG tablet Commonly known as: ALDACTONE Take 50 mg by mouth daily.   triamcinolone 0.1 % Commonly known as: KENALOG Apply 1 application topically 2 (two) times daily as needed (rash/ eczema).   Vitamin D3 10 MCG (400 UNIT) Caps Take 800 Units by mouth daily.      Allergies  Allergen Reactions  . Naproxen Other (See Comments)    Patient preference  Other reaction(s): upset stomach  . Prednisone Other (See Comments)    Patient Preference  Other reaction(s): upset stomach  . Amlodipine Hives  . Losartan Rash    Follow-up Information    [facility].   Specialty: Rehabilitation Contact information: [address] [place] [id number] [place]       Seward Carol, MD. Schedule an appointment as soon as possible for a visit in 1 week(s).   Specialty: Internal  Medicine Why: Hospital follow up Contact information: [address] [place] [address] [place] [address] [phone]        Star Age, MD. Schedule an appointment as soon as possible for a visit in 2 week(s).   Specialties: Neurology, Radiology Why: Hospital follow up Contact information: [address] [place] [address] [phone]                The results of significant diagnostics from this hospitalization (including imaging, microbiology, ancillary and laboratory) are listed below for reference.    Significant Diagnostic Studies: DG Chest 2 View  Result Date: 09/04/2020 CLINICAL DATA:  Chest pain. EXAM: CHEST - 2 VIEW COMPARISON:  10/11/2019 FINDINGS: The cardiomediastinal contours are normal. Subsegmental atelectasis in the lung bases. Pulmonary vasculature is normal. No consolidation, pleural effusion, or pneumothorax. No acute osseous abnormalities are seen. IMPRESSION: Subsegmental bibasilar atelectasis. Electronically Signed   By: Aurther Loft.D.  On: 09/04/2020 19:46   CT Head Wo Contrast  Result Date: 09/05/2020 CLINICAL DATA:  TIA EXAM: CT HEAD WITHOUT CONTRAST TECHNIQUE: Contiguous axial images were obtained from the base of the skull through the vertex without intravenous contrast. COMPARISON:  05/26/2020 FINDINGS: Brain: No evidence of acute infarction, hemorrhage, hydrocephalus, extra-axial collection or mass lesion/mass effect. Age normal brain volume and white matter appearance Vascular: No hyperdense vessel or unexpected calcification. Skull: Normal. Negative for fracture or focal lesion. Sinuses/Orbits: No acute finding. IMPRESSION: No acute finding.  Unremarkable study for age. Electronically Signed   By: Monte Fantasia M.D.   On: 09/05/2020 05:17   MR ANGIO HEAD WO CONTRAST  Result Date: 09/05/2020 CLINICAL DATA:  Stroke, follow-up. EXAM: MRI HEAD WITHOUT CONTRAST MRA HEAD WITH AND WITHOUT CONTRAST MRA NECK WITHOUT  CONTRAST TECHNIQUE: Multiplanar, multiecho pulse sequences of the brain and surrounding structures were obtained without intravenous contrast. Angiographic images of the Circle of Willis were obtained using MRA technique with and without intravenous contrast. Angiographic images of the neck were obtained using MRA technique without intravenous contrast. Carotid stenosis measurements (when applicable) are obtained utilizing NASCET criteria, using the distal internal carotid diameter as the denominator. COMPARISON:  Same day CT head. MRI head without report September 15, 2019. MRA 03/09/2004 FINDINGS: MRI HEAD FINDINGS Brain: No acute infarct. No hydrocephalus. Patchy white matter T2/FLAIR hyperintensities, mild for age and most likely related to chronic microvascular ischemic disease. No acute hemorrhage. No mass lesion or abnormal mass effect. No extra-axial fluid collection. Vascular: Described below. Skull and upper cervical spine: Normal marrow signal. Sinuses/Orbits: Mild mucosal thickening left sphenoid sinus and scattered ethmoid air cells. Unremarkable orbits. MRA HEAD FINDINGS Anterior circulation: Bilateral ICAs are widely patent. Bilateral MCAs and ACAs are patent without evidence of hemodynamically significant proximal stenosis. Conical outpouching arising from the left posterior communicating artery origin, compatible with an infundibulum. No aneurysm identified. Posterior circulation: Dominant intradural left vertebral artery. Bilateral vertebral arteries and the basilar artery are patent without evidence of hemodynamically significant stenosis. Patent bilateral posterior cerebral arteries without evidence of hemodynamically significant stenosis. No aneurysm identified. MRA NECK FINDINGS Bilateral carotid arteries are widely patent without evidence of greater than 50% stenosis. Left dominant vertebral artery. Similar marked tortuosity of the left vertebral artery without evidence of hemodynamically  significant stenosis. The left vertebral artery is distended throughout its course, similar to prior. IMPRESSION: 1. No evidence of acute intracranial abnormality.  No acute infarct. 2. No emergent large vessel occlusion or evidence of proximal hemodynamically significant stenosis in the head or neck. 3. In comparison to 9201, similar dolichoectatic left vertebral artery. Electronically Signed   By: Margaretha Sheffield MD   On: 09/05/2020 09:04   MR ANGIO NECK W WO CONTRAST  Result Date: 09/05/2020 CLINICAL DATA:  Stroke, follow-up. EXAM: MRI HEAD WITHOUT CONTRAST MRA HEAD WITH AND WITHOUT CONTRAST MRA NECK WITHOUT CONTRAST TECHNIQUE: Multiplanar, multiecho pulse sequences of the brain and surrounding structures were obtained without intravenous contrast. Angiographic images of the Circle of Willis were obtained using MRA technique with and without intravenous contrast. Angiographic images of the neck were obtained using MRA technique without intravenous contrast. Carotid stenosis measurements (when applicable) are obtained utilizing NASCET criteria, using the distal internal carotid diameter as the denominator. COMPARISON:  Same day CT head. MRI head without report September 15, 2019. MRA 03/09/2004 FINDINGS: MRI HEAD FINDINGS Brain: No acute infarct. No hydrocephalus. Patchy white matter T2/FLAIR hyperintensities, mild for age and most likely related to chronic microvascular  ischemic disease. No acute hemorrhage. No mass lesion or abnormal mass effect. No extra-axial fluid collection. Vascular: Described below. Skull and upper cervical spine: Normal marrow signal. Sinuses/Orbits: Mild mucosal thickening left sphenoid sinus and scattered ethmoid air cells. Unremarkable orbits. MRA HEAD FINDINGS Anterior circulation: Bilateral ICAs are widely patent. Bilateral MCAs and ACAs are patent without evidence of hemodynamically significant proximal stenosis. Conical outpouching arising from the left posterior communicating  artery origin, compatible with an infundibulum. No aneurysm identified. Posterior circulation: Dominant intradural left vertebral artery. Bilateral vertebral arteries and the basilar artery are patent without evidence of hemodynamically significant stenosis. Patent bilateral posterior cerebral arteries without evidence of hemodynamically significant stenosis. No aneurysm identified. MRA NECK FINDINGS Bilateral carotid arteries are widely patent without evidence of greater than 50% stenosis. Left dominant vertebral artery. Similar marked tortuosity of the left vertebral artery without evidence of hemodynamically significant stenosis. The left vertebral artery is distended throughout its course, similar to prior. IMPRESSION: 1. No evidence of acute intracranial abnormality.  No acute infarct. 2. No emergent large vessel occlusion or evidence of proximal hemodynamically significant stenosis in the head or neck. 3. In comparison to 0086, similar dolichoectatic left vertebral artery. Electronically Signed   By: Margaretha Sheffield MD   On: 09/05/2020 09:04   MR BRAIN WO CONTRAST  Result Date: 09/05/2020 CLINICAL DATA:  Stroke, follow-up. EXAM: MRI HEAD WITHOUT CONTRAST MRA HEAD WITH AND WITHOUT CONTRAST MRA NECK WITHOUT CONTRAST TECHNIQUE: Multiplanar, multiecho pulse sequences of the brain and surrounding structures were obtained without intravenous contrast. Angiographic images of the Circle of Willis were obtained using MRA technique with and without intravenous contrast. Angiographic images of the neck were obtained using MRA technique without intravenous contrast. Carotid stenosis measurements (when applicable) are obtained utilizing NASCET criteria, using the distal internal carotid diameter as the denominator. COMPARISON:  Same day CT head. MRI head without report September 15, 2019. MRA 03/09/2004 FINDINGS: MRI HEAD FINDINGS Brain: No acute infarct. No hydrocephalus. Patchy white matter T2/FLAIR hyperintensities,  mild for age and most likely related to chronic microvascular ischemic disease. No acute hemorrhage. No mass lesion or abnormal mass effect. No extra-axial fluid collection. Vascular: Described below. Skull and upper cervical spine: Normal marrow signal. Sinuses/Orbits: Mild mucosal thickening left sphenoid sinus and scattered ethmoid air cells. Unremarkable orbits. MRA HEAD FINDINGS Anterior circulation: Bilateral ICAs are widely patent. Bilateral MCAs and ACAs are patent without evidence of hemodynamically significant proximal stenosis. Conical outpouching arising from the left posterior communicating artery origin, compatible with an infundibulum. No aneurysm identified. Posterior circulation: Dominant intradural left vertebral artery. Bilateral vertebral arteries and the basilar artery are patent without evidence of hemodynamically significant stenosis. Patent bilateral posterior cerebral arteries without evidence of hemodynamically significant stenosis. No aneurysm identified. MRA NECK FINDINGS Bilateral carotid arteries are widely patent without evidence of greater than 50% stenosis. Left dominant vertebral artery. Similar marked tortuosity of the left vertebral artery without evidence of hemodynamically significant stenosis. The left vertebral artery is distended throughout its course, similar to prior. IMPRESSION: 1. No evidence of acute intracranial abnormality.  No acute infarct. 2. No emergent large vessel occlusion or evidence of proximal hemodynamically significant stenosis in the head or neck. 3. In comparison to 7619, similar dolichoectatic left vertebral artery. Electronically Signed   By: Margaretha Sheffield MD   On: 09/05/2020 09:04   EEG adult  Result Date: 09/05/2020 Mickie Hillier, MD     09/05/2020  1:05 PM Telespecialist EEG Report  EEG Date 09/05/20  Duration: 22.35  Clinical Indication: seizure r/o epileptiform discharges  EEG Procedure: This is a digitally recorded routine  electroencephalogram. The international 10-20 electrode placement system is used for scalp electrode placement. Eighteen channels of scalp EEG are recorded The data are stored digitally and reviewed in reformatted montages for optimal display.  EEG Description: This EEG is well organized. There was a well formed, well sustained and symmetrical 9-10hz  alpha rhythm that attenuated with eye opening and drowsiness. The background consisted of a mixture of alpha and beta frequencies of medium voltage.  During the brief period of drowsiness, background slowing appeared.  Stage 2 sleep was not recorded. Rare intermittent polymorphic delta slowing was present in the left temporal region.  There were no epileptiform discharges. Activating procedures: These were not performed. EKG: The EKG rhythm strip showed sinus bradycardia down to 54bpm.  EEG Classification: Abnormal 1.  Intermittent polymorphic delta slowing, left, temporal  EEG Interpretation: This routine EEG recorded in the awake and drowsy states is abnormal. The focal slowing present in the left temporal region suggest focal cerebral dysfunction in this region.  There were no epileptiform discharges present.   VAS [place] LOWER EXTREMITY VENOUS (DVT)  Result Date: 09/06/2020  Lower Venous DVT Study Indications: Pain in calves and behind knee.  Comparison Study: No prior studies. Performing Technologist: Darlin Coco RDMS,RVT  Examination Guidelines: A complete evaluation includes B-mode imaging, spectral Doppler, color Doppler, and power Doppler as needed of all accessible portions of each vessel. Bilateral testing is considered an integral part of a complete examination. Limited examinations for reoccurring indications may be performed as noted. The reflux portion of the exam is performed with the patient in reverse Trendelenburg.  +---------+---------------+---------+-----------+----------+--------------+ RIGHT     CompressibilityPhasicitySpontaneityPropertiesThrombus Aging +---------+---------------+---------+-----------+----------+--------------+ CFV      Full           Yes      Yes                                 +---------+---------------+---------+-----------+----------+--------------+ SFJ      Full                                                        +---------+---------------+---------+-----------+----------+--------------+ FV Prox  Full                                                        +---------+---------------+---------+-----------+----------+--------------+ FV Mid   Full                                                        +---------+---------------+---------+-----------+----------+--------------+ FV DistalFull                                                        +---------+---------------+---------+-----------+----------+--------------+ PFV  Full                                                        +---------+---------------+---------+-----------+----------+--------------+ POP      Full           Yes      Yes                                 +---------+---------------+---------+-----------+----------+--------------+ PTV      Full                                                        +---------+---------------+---------+-----------+----------+--------------+ PERO     Full                                                        +---------+---------------+---------+-----------+----------+--------------+ Gastroc  Full                                                        +---------+---------------+---------+-----------+----------+--------------+   +---------+---------------+---------+-----------+----------+--------------+ LEFT     CompressibilityPhasicitySpontaneityPropertiesThrombus Aging +---------+---------------+---------+-----------+----------+--------------+ CFV      Full           Yes      Yes                                  +---------+---------------+---------+-----------+----------+--------------+ SFJ      Full                                                        +---------+---------------+---------+-----------+----------+--------------+ FV Prox  Full                                                        +---------+---------------+---------+-----------+----------+--------------+ FV Mid   Full                                                        +---------+---------------+---------+-----------+----------+--------------+ FV DistalFull                                                        +---------+---------------+---------+-----------+----------+--------------+  PFV      Full                                                        +---------+---------------+---------+-----------+----------+--------------+ POP      Full           Yes      Yes                                 +---------+---------------+---------+-----------+----------+--------------+ PTV      Full                                                        +---------+---------------+---------+-----------+----------+--------------+ PERO     Full                                                        +---------+---------------+---------+-----------+----------+--------------+ Gastroc  Full                                                        +---------+---------------+---------+-----------+----------+--------------+     Summary: RIGHT: - There is no evidence of deep vein thrombosis in the lower extremity.  - No cystic structure found in the popliteal fossa.  LEFT: - There is no evidence of deep vein thrombosis in the lower extremity.  - No cystic structure found in the popliteal fossa.  *See table(s) above for measurements and observations.    Preliminary     Microbiology: Recent Results (from the past 240 hour(s))  SARS CORONAVIRUS 2 (TAT 6-24 HRS) Nasopharyngeal Nasopharyngeal Swab      Status: None   Collection Time: 09/05/20  6:30 AM   Specimen: Nasopharyngeal Swab  Result Value Ref Range Status   SARS Coronavirus 2 NEGATIVE NEGATIVE Final    Comment: (NOTE) SARS-CoV-2 target nucleic acids are NOT DETECTED.  The SARS-CoV-2 RNA is generally detectable in upper and lower respiratory specimens during the acute phase of infection. Negative results do not preclude SARS-CoV-2 infection, do not rule out co-infections with other pathogens, and should not be used as the sole basis for treatment or other patient management decisions. Negative results must be combined with clinical observations, patient history, and epidemiological information. The expected result is Negative.  Fact Sheet for Patients: SugarRoll.be  Fact Sheet for Healthcare Providers: https://www.woods-mathews.com/  This test is not yet approved or cleared by the [place] FDA and  has been authorized for detection and/or diagnosis of SARS-CoV-2 by FDA under an Emergency Use Authorization (EUA). This EUA will remain  in effect (meaning this test can be used) for the duration of the COVID-19 declaration under Se ction 564(b)(1) of the Act, 21 U.S.C. section 360bbb-3(b)(1), unless the authorization is terminated or revoked sooner.  Performed at [facility], [place]  [address], [place], [place] [address]      Labs: Basic Metabolic Panel: Recent Labs  Lab 09/04/20 1839 09/05/20 0900 09/06/20 0229  NA 136  --  138  K 4.1  --  3.9  CL 103  --  109  CO2 29  --  26  GLUCOSE 110*  --  101*  BUN 20  --  13  CREATININE 0.92  --  0.83  CALCIUM 9.9  --  9.0  MG  --  2.1  --    Liver Function Tests: Recent Labs  Lab 09/06/20 0229  AST 22  ALT 20  ALKPHOS 39  BILITOT 0.8  PROT 6.4*  ALBUMIN 3.6   No results for input(s): LIPASE, AMYLASE in the last 168 hours. No results for input(s): AMMONIA in the last 168 hours. CBC: Recent Labs  Lab  09/04/20 1839  WBC 6.3  HGB 12.5  HCT 37.3  MCV 89.9  PLT 246   Cardiac Enzymes: No results for input(s): CKTOTAL, CKMB, CKMBINDEX, TROPONINI in the last 168 hours. BNP: BNP (last 3 results) No results for input(s): BNP in the last 8760 hours.  ProBNP (last 3 results) No results for input(s): PROBNP in the last 8760 hours.  CBG: No results for input(s): GLUCAP in the last 168 hours.     Signed:  Cristal Ford  [facility] 09/06/2020, 5:24 PM

## 2020-09-06 NOTE — Evaluation (Signed)
Occupational Therapy Evaluation and Discharge Summary Patient Details Name: Sara Brown MRN: [id number] DOB: 1941/01/02 Today's Date: 09/06/2020    History of Present Illness Pt is an 80 yo female admitted with L side facial numbness and "funny feeling in face and L leg."  Pt has these symptoms frequently with work ups that have been negative.  When in ER, pt's symptoms resolved.  Pt had EEG to r/o sz and L temporal lobe cerberal dysfunction was noted. Pt started on seizure medications and driving restrictions reviewed.   Clinical Impression   Pt admitted with the above diagnosis and has the few deficits listed below. Pt overall is completing adls at her baseline and has husband at home to assist.  Seizure activity noted on EEG.  Spoke with pt about driving restrictions that MD had also discussed with her.  No further acute OT needs at this time as pt is performing adls at baseline in room.     Follow Up Recommendations  Supervision - Intermittent;No OT follow up    Equipment Recommendations  None recommended by OT    Recommendations for Other Services       Precautions / Restrictions Precautions Precautions: Other (comment) (sz) Precaution Comments: sz precautions Restrictions Weight Bearing Restrictions: No      Mobility Bed Mobility Overal bed mobility: Modified Independent             General bed mobility comments: HOB raised    Transfers Overall transfer level: Modified independent Equipment used: None             General transfer comment: No physical assist needed. Cues given to move slowly and stand a few seconds before walking to get balance.    Balance Overall balance assessment: Mild deficits observed, not formally tested                                         ADL either performed or assessed with clinical judgement   ADL Overall ADL's : At baseline                                       General ADL  Comments: Pt overall at baseline with adls. Pt walked in hallway c/o knee issues.  Spoke with PT about checking in with pt about her knee pain.  pt safe when walking in hallway.     Vision Baseline Vision/History: No visual deficits Patient Visual Report: No change from baseline Vision Assessment?: No apparent visual deficits     Perception Perception Perception Tested?: No   Praxis Praxis Praxis tested?: Within functional limits    Pertinent Vitals/Pain Pain Assessment: Faces Faces Pain Scale: Hurts a little bit Pain Location: knees Pain Descriptors / Indicators: Aching Pain Intervention(s): Monitored during session     Hand Dominance Right   Extremity/Trunk Assessment Upper Extremity Assessment Upper Extremity Assessment: Overall WFL for tasks assessed   Lower Extremity Assessment Lower Extremity Assessment: Defer to PT evaluation   Cervical / Trunk Assessment Cervical / Trunk Assessment: Normal   Communication Communication Communication: No difficulties   Cognition Arousal/Alertness: Awake/alert Behavior During Therapy: WFL for tasks assessed/performed Overall Cognitive Status: Within Functional Limits for tasks assessed  General Comments  Pt appears safe to return home with husband.  Knee issues discussed with PT.  Discussed driving restrictions due to sz like activity noted.    Exercises     Shoulder Instructions      Home Living Family/patient expects to be discharged to:: Private residence Living Arrangements: Spouse/significant other Available Help at Discharge: Family;Available 24 hours/day Type of Home: House Home Access: Stairs to enter [employer] of Steps: 6 Entrance Stairs-Rails: None Home Layout: One level     Bathroom Shower/Tub: Occupational psychologist: Standard Bathroom Accessibility: No   Home Equipment: None   Additional Comments: Pt has never walked with cane  or walker and does not plan on it.      Prior Functioning/Environment Level of Independence: Independent        Comments: Pt likes to work in yard and is very independent.        OT Problem List: Pain      OT Treatment/Interventions:      OT Goals(Current goals can be found in the care plan section) Acute Rehab OT Goals Patient Stated Goal: to go home OT Goal Formulation: All assessment and education complete, DC therapy  OT Frequency:     Barriers to D/C:            Co-evaluation              AM-PAC OT "6 Clicks" Daily Activity     Outcome Measure Help from another person eating meals?: None Help from another person taking care of personal grooming?: None Help from another person toileting, which includes using toliet, bedpan, or urinal?: None Help from another person bathing (including washing, rinsing, drying)?: None Help from another person to put on and taking off regular upper body clothing?: None Help from another person to put on and taking off regular lower body clothing?: None 6 Click Score: 24   End of Session Nurse Communication: Mobility status  Activity Tolerance: Patient tolerated treatment well Patient left: in chair  OT Visit Diagnosis: Unsteadiness on feet (R26.81)                Time: 0100-7121 OT Time Calculation (min): 18 min Charges:  OT General Charges $OT Visit: 1 Visit OT Evaluation $OT Eval Low Complexity: 1 Low  Glenford Peers 09/06/2020, 12:09 PM

## 2020-09-06 NOTE — Evaluation (Signed)
Physical Therapy Evaluation Patient Details Name: Sara Sara Brown MRN: [id number] DOB: [birth year] Today's Date: 09/06/2020   History of Present Illness  Pt is an 80 yo female admitted with L side facial numbness and "funny feeling in face and L leg."  Pt has these symptoms frequently with work ups that have been negative.  When in ER, pt's symptoms resolved.  Pt had EEG to r/o sz and L temporal lobe cerberal dysfunction was noted. Pt started on seizure medications and driving restrictions reviewed.  Clinical Impression  Pt presents with condition above and deficits mentioned below, see PT Problem List. PTA she was independent without AD/AE for all functional mobility. Pt denies any falls in past year. Currently, pt is demonstrating slight generalized, but fairly symmetrical, lower extremity weakness and bil knee pain (L worse than R). These deficits impact her balance, resulting in occasional knee buckling, primarily on her L. She is able to recover her balance with min guard but is at risk for falls, per her DGI score of 17 this date. Her posterior knee pain was unable to be reproduced with palpation at posterior knee or surrounding musculature or with stretching of posterior musculature. However, she may have a positive McConnell's test on the L. Recommending follow-up with Outpatient PT to further address these deficits. Will continue to follow acutely.     Follow Up Recommendations Outpatient PT    Equipment Recommendations  None recommended by PT    Recommendations for Other Services       Precautions / Restrictions Precautions Precautions: Other (comment) (sz) Precaution Comments: sz precautions Restrictions Weight Bearing Restrictions: No      Mobility  Bed Mobility Overal bed mobility: Modified Independent             General bed mobility comments: Pt sitting up in recliner upon arrival.    Transfers Overall transfer level: Needs assistance Equipment used:  None Transfers: Sit to/from Stand Sit to Stand: Min guard         General transfer comment: Pt taking extra steps immediately upon coming to stand due to L knee buckling/instability reported by pt, but able to recover her balance with min guard.  Ambulation/Gait Ambulation/Gait assistance: Min guard Gait Distance (Feet): 250 Feet Assistive device: None Gait Pattern/deviations: Step-through pattern (intermittent knee buckling) Gait velocity: WFL Gait velocity interpretation: >4.37 ft/sec, indicative of normal walking speed (when cued to increase speed) General Gait Details: Pt able to ambulate at various speeds, but demonstrates intermittent knee buckling that impacts her balance and safety. Pt able to recover her balance with min guard though. extra steps laterally, staggering on occasion, with changes in head position.  Stairs Stairs: Yes Stairs assistance: Min guard Stair Management: No rails;Alternating pattern;Step to pattern Number of Stairs: 3 General stair comments: Ascends quickly with reciprocal step pattern, descends more slowly with step-to, min guard for safety. Intermittent knee instability noted.  Wheelchair Mobility    Modified Rankin (Stroke Patients Only) Modified Rankin (Stroke Patients Only) Pre-Morbid Rankin Score: No symptoms Modified Rankin: Slight disability     Balance Overall balance assessment: Mild deficits observed, not formally tested                               Standardized Balance Assessment Standardized Balance Assessment : Dynamic Gait Index   Dynamic Gait Index Level Surface: Normal Change in Gait Speed: Normal Gait with Horizontal Head Turns: Moderate Impairment Gait with Vertical Head Turns: Moderate  Impairment Gait and Pivot Turn: Normal Step Over Obstacle: Mild Impairment Step Around Obstacles: Mild Impairment Steps: Mild Impairment Total Score: 17       Pertinent Vitals/Pain Pain Assessment: Faces Faces Pain  Scale: Hurts a Sara Brown bit Pain Location: knees Pain Descriptors / Indicators: Aching Pain Intervention(s): Limited activity within patient's tolerance;Monitored during session;Repositioned    Home Living Family/patient expects to be discharged to:: Private residence Living Arrangements: Spouse/significant other Available Help at Discharge: Family;Available 24 hours/day Type of Home: House Home Access: Stairs to enter Entrance Stairs-Rails: None Entrance Stairs-Number of Steps: 6 Home Layout: One level Home Equipment: None Additional Comments: Pt has never walked with cane or walker and does not plan on it.    Prior Function Level of Independence: Independent         Comments: Pt likes to work in yard and is very independent. Denies any falls in past year.     Hand Dominance   Dominant Hand: Right    Extremity/Trunk Assessment   Upper Extremity Assessment Upper Extremity Assessment: Defer to OT evaluation    Lower Extremity Assessment Lower Extremity Assessment: Generalized weakness (hx of peripheral neuropathy but no acute numbness/tingling reported by pt; gross incoordination; MMT scores of 4+ to 5 bil)    Cervical / Trunk Assessment Cervical / Trunk Assessment: Normal  Communication   Communication: No difficulties  Cognition Arousal/Alertness: Awake/alert Behavior During Therapy: WFL for tasks assessed/performed Overall Cognitive Status: Within Functional Limits for tasks assessed                                        General Comments General comments (skin integrity, edema, etc.): Possible positive McConnell's Test L knee?; no pain reproduction with palpation; pt noted pain in posterior aspect of knees, esp on L; educated pt verbally and with demonstration on moderate intensity lower extremity exercises with sit <> stand, step-ups/downs, LAQ, standing 3-way hip with safe placement of chairs and non-moving counters to hold onto along with  instructions to have husband present for safety.    Exercises     Assessment/Plan    PT Assessment Patient needs continued PT services  PT Problem List Decreased strength;Decreased balance;Decreased mobility;Decreased coordination;Impaired sensation       PT Treatment Interventions DME instruction;Gait training;Stair training;Functional mobility training;Therapeutic activities;Therapeutic exercise;Balance training;Neuromuscular re-education;Patient/family education    PT Goals (Current goals can be found in the Care Plan section)  Acute Rehab PT Goals Patient Stated Goal: to have her knees improve PT Goal Formulation: With patient Time For Goal Achievement: 09/20/20 Potential to Achieve Goals: Good    Frequency Min 3X/week   Barriers to discharge        Co-evaluation               AM-PAC PT "6 Clicks" Mobility  Outcome Measure Help needed turning from your back to your side while in a flat bed without using bedrails?: None Help needed moving from lying on your back to sitting on the side of a flat bed without using bedrails?: None Help needed moving to and from a bed to a chair (including a wheelchair)?: A Sara Brown Help needed standing up from a chair using your arms (e.g., wheelchair or bedside chair)?: A Sara Brown Help needed to walk in hospital room?: A Sara Brown Help needed climbing 3-5 steps with a railing? : A Sara Brown 6 Click Score: 20    End of Session  Equipment Utilized During Treatment: Gait belt Activity Tolerance: Patient tolerated treatment well Patient left: in chair;with call bell/phone within reach Nurse Communication: Mobility status PT Visit Diagnosis: Unsteadiness on feet (R26.81);Other abnormalities of gait and mobility (R26.89);Muscle weakness (generalized) (M62.81);Difficulty in walking, not elsewhere classified (R26.2)    Time: 0938-1829 PT Time Calculation (min) (ACUTE ONLY): 18 min   Charges:   PT Evaluation $PT Eval Low Complexity: 1 Low           Moishe Spice, PT, DPT Acute Rehabilitation Services  Pager: [phone] Office: [phone]   Orvan Falconer 09/06/2020, 12:55 PM

## 2020-09-06 NOTE — Discharge Instructions (Signed)
Paresthesia Paresthesia is a burning or prickling feeling. This feeling can happen in any part of the body. It often happens in the hands, arms, legs, or feet. Usually, it is not painful. In most cases, the feeling goes away in a short time and is not a sign of a serious problem. If you have paresthesia that lasts a long time, you need to be seen by your doctor. Follow these instructions at home: Alcohol use  Do not drink alcohol if: ? Your doctor tells you not to drink. ? You are pregnant, may be pregnant, or are planning to become pregnant.  If you drink alcohol: ? Limit how much you use to:  0-1 drink a day for women.  0-2 drinks a day for men. ? Be aware of how much alcohol is in your drink. In the [place] one drink equals one 12 oz bottle of beer (355 mL), one 5 oz glass of wine (148 mL), or one 1 oz glass of hard liquor (44 mL).   Nutrition  Eat a healthy diet. This includes: ? Eating foods that have a lot of fiber in them, such as fresh fruits and vegetables, whole grains, and beans. ? Limiting foods that have a lot of fat and processed sugars in them, such as fried or sweet foods.   General instructions  Take over-the-counter and prescription medicines only as told by your doctor.  Do not use any products that have nicotine or tobacco in them, such as cigarettes and e-cigarettes. If you need help quitting, ask your doctor.  If you have diabetes, work with your doctor to make sure your blood sugar stays in a healthy range.  If your feet feel numb: ? Check for redness, warmth, and swelling every day. ? Wear padded socks and comfortable shoes. These help protect your feet.  Keep all follow-up visits as told by your doctor. This is important. Contact a doctor if:  You have paresthesia that gets worse or does not go away.  Lose feeling (have numbness) after an injury.  Your burning or prickling feeling gets worse when you walk.  You have pain or cramps.  You feel dizzy  or pass out (faint).  You have a rash. Get help right away if you:  Feel weak or have new weakness in an arm or leg.  Have trouble walking or moving.  Have problems speaking, understanding, or seeing.  Feel confused.  Cannot control when you pee (urinate) or poop (have a bowel movement). Summary  Paresthesia is a burning or prickling feeling. It often happens in the hands, arms, legs, or feet.  In most cases, the feeling goes away in a short time and is not a sign of a serious problem.  If you have paresthesia that lasts a long time, you need to be seen by your doctor. This information is not intended to replace advice given to you by your health care provider. Make sure you discuss any questions you have with your health care provider. Document Revised: 03/07/2020 Document Reviewed: 03/07/2020 Elsevier Patient Education  2021 [employer].  Seizure, Adult A seizure is a sudden burst of abnormal electrical and chemical activity in the brain. Seizures usually last from 30 seconds to 2 minutes.  What are the causes? Common causes of this condition include:  Fever or infection.  Problems that affect the brain. These may include: ? A brain or head injury. ? Bleeding in the brain. ? A brain tumor.  Low levels of blood sugar  or salt.  Kidney problems or liver problems.  Conditions that are passed from parent to child (are inherited).  Problems with a substance, such as: ? Having a reaction to a drug or a medicine. ? Stopping the use of a substance all of a sudden (withdrawal).  A stroke.  Disorders that affect how you develop. Sometimes, the cause may not be known.  What increases the risk?  Having someone in your family who has epilepsy. In this condition, seizures happen again and again over time. They have no clear cause.  Having had a tonic-clonic seizure before. This type of seizure causes you to: ? Tighten the muscles of the whole body. ? Lose  consciousness.  Having had a head injury or strokes before.  Having had a lack of oxygen at birth. What are the signs or symptoms? There are many types of seizures. The symptoms vary depending on the type of seizure you have. Symptoms during a seizure  Shaking that you cannot control (convulsions) with fast, jerky movements of muscles.  Stiffness of the body.  Breathing problems.  Feeling mixed up (confused).  Staring or not responding to sound or touch.  Head nodding.  Eyes that blink, flutter, or move fast.  Drooling, grunting, or making clicking sounds with your mouth  Losing control of when you pee or poop. Symptoms before a seizure  Feeling afraid, nervous, or worried.  Feeling like you may vomit.  Feeling like: ? You are moving when you are not. ? Things around you are moving when they are not.  Feeling like you saw or heard something before (dj vu).  Odd tastes or smells.  Changes in how you see. You may see flashing lights or spots. Symptoms after a seizure  Feeling confused.  Feeling sleepy.  Headache.  Sore muscles. How is this treated? If your seizure stops on its own, you will not need treatment. If your seizure lasts longer than 5 minutes, you will normally need treatment. Treatment may include:  Medicines given through an IV tube.  Avoiding things, such as medicines, that are known to cause your seizures.  Medicines to prevent seizures.  A device to prevent or control seizures.  Surgery.  A diet low in carbohydrates and high in fat (ketogenic diet). Follow these instructions at home: Medicines  Take over-the-counter and prescription medicines only as told by your doctor.  Avoid foods or drinks that may keep your medicine from working, such as alcohol. Activity  Follow instructions about driving, swimming, or doing things that would be dangerous if you had another seizure. Wait until your doctor says it is safe for you to do these  things.  If you live in the [place] ask your local department of motor vehicles when you can drive.  Get a lot of rest. Teaching others  Teach friends and family what to do when you have a seizure. They should: ? Help you get down to the ground. ? Protect your head and body. ? Loosen any clothing around your neck. ? Turn you on your side. ? Know whether or not you need emergency care. ? Stay with you until you are better.  Also, tell them what not to do if you have a seizure. Tell them: ? They should not hold you down. ? They should not put anything in your mouth.   General instructions  Avoid anything that gives you seizures.  Keep a seizure diary. Write down: ? What you remember about each seizure. ? What  you think caused each seizure.  Keep all follow-up visits. Contact a doctor if:  You have another seizure or seizures. Call the doctor each time you have a seizure.  The pattern of your seizures changes.  You keep having seizures with treatment.  You have symptoms of being sick or having an infection.  You are not able to take your medicine. Get help right away if:  You have any of these problems: ? A seizure that lasts longer than 5 minutes. ? Many seizures in a row and you do not feel better between seizures. ? A seizure that makes it harder to breathe. ? A seizure and you can no longer speak or use part of your body.  You do not wake up right after a seizure.  You get hurt during a seizure.  You feel confused or have pain right after a seizure. These symptoms may be an emergency. Get help right away. Call your local emergency services (911 in the [place]  Do not wait to see if the symptoms will go away.  Do not drive yourself to the hospital. Summary  A seizure is a sudden burst of abnormal electrical and chemical activity in the brain. Seizures normally last from 30 seconds to 2 minutes.  Causes of seizures include illness, injury to the head, low  levels of blood sugar or salt, and certain conditions.  Most seizures will stop on their own in less than 5 minutes. Seizures that last longer than 5 minutes are a medical emergency and need treatment right away.  Many medicines are used to treat seizures. Take over-the-counter and prescription medicines only as told by your doctor. This information is not intended to replace advice given to you by your health care provider. Make sure you discuss any questions you have with your health care provider. Document Revised: 12/03/2019 Document Reviewed: 12/03/2019 Elsevier Patient Education  [place].

## 2020-09-06 NOTE — TOC Initial Note (Signed)
Transition of Care [facility] - Initial/Assessment Note    Patient Details  Name: Sara Brown MRN: [id number] Date of Birth: Aug 12, 1940  Transition of Care [facility]) CM/SW Contact:    Marilu Favre, RN Phone Number: 09/06/2020, 2:35 PM  Clinical Narrative:                 Patient from home with  with husband.   Confirmed face sheet information.   Discussed OP PT at [facility]. Patient in agreement and husband can provide transportation.   Ordered placed and information placed on AVS. They will call patient to schedule visit.   Expected Discharge Plan: Home/Self Care     Patient Goals and CMS Choice Patient states their goals for this hospitalization and ongoing recovery are:: to return to home CMS Medicare.gov Compare Post Acute Care list provided to:: Patient Choice offered to / list presented to : Patient  Expected Discharge Plan and Services Expected Discharge Plan: Home/Self Care     Post Acute Care Choice:  (OP PT) Living arrangements for the past 2 months: Single Family Home                 DME Arranged: N/A DME Agency: NA       HH Arranged: NA          Prior Living Arrangements/Services Living arrangements for the past 2 months: Single Family Home Lives with:: Spouse Patient language and need for interpreter reviewed:: Yes Do you feel safe going back to the place where you live?: Yes      Need for Family Participation in Patient Care: Yes (Comment) Care giver support system in place?: Yes (comment)   Criminal Activity/Legal Involvement Pertinent to Current Situation/Hospitalization: No - Comment as needed  Activities of Daily Living      Permission Sought/Granted   Permission granted to share information with : No              Emotional Assessment Appearance:: Appears stated age Attitude/Demeanor/Rapport: Engaged Affect (typically observed): Accepting Orientation: : Oriented to Self,Oriented to Place,Oriented to  Time,Oriented  to Situation Alcohol / Substance Use: Not Applicable Psych Involvement: No (comment)  Admission diagnosis:  TIA (transient ischemic attack) [G45.9] Patient Active Problem List   Diagnosis Date Noted  . Paresthesias 09/05/2020  . Age-related osteoporosis without current pathological fracture 08/18/2020  . Pure hypercholesterolemia 08/18/2020  . Chronic constipation 08/18/2020  . Right ear impacted cerumen 10/26/2019  . CAD (coronary artery disease) 09/17/2018  . Diabetes mellitus type 2, controlled ([place]) 03/31/2018  . Essential hypertension 03/31/2018  . Dyslipidemia 03/31/2018  . Chest pain 02/10/2018  . Chest pain due to coronary artery disease ([place]) 02/09/2018  . Angina at rest [facility]) 02/09/2018  . Bradycardia 02/09/2018  . Skin laxity 06/12/2017  . Hirsutism 06/12/2017  . Hyperpigmentation 02/13/2017  . Neck mass 01/17/2017  . 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 Ear pain, left 02/21/2016  . GERD (gastroesophageal reflux disease)   . Shortness of breath 07/30/2007  . Cough 07/30/2007   PCP:  Seward Carol, MD Pharmacy:   [facility], [place] [address] [place] [address] Phone: [phone] Fax: [phone]  [facility] Ferrier [address] [place] [address] [name] [address] Phone: [phone] Fax: [phone]     Social Determinants of Health ([place]) Interventions    Readmission Risk Interventions No flowsheet data  found.  

## 2020-09-07 ENCOUNTER — Telehealth: Payer: Self-pay

## 2020-09-07 NOTE — Telephone Encounter (Signed)
Recent hospitalization reviewed. Continue current antihypertensive therapy for now. IF continues paresthesia symptoms, need to be evaluated by neurology.

## 2020-09-07 NOTE — Telephone Encounter (Signed)
Called and reviewed with pt. Home BP continues to remain stable around ~140s/80s. Reviewed recent hospital admission notes. Pt was noted to have concerning EEG which showed focal slowing present in the left temporal region suggesting focal cerebral dysfunction in this region.  No elective form discharges present. Pt was started on Keppra and was asked to follow up with Neurology. Appt scheduled for 10/10/20. All other workup negative for any significant findings. Pt with chronic symptoms with no recent changes. Hydralazine dose also changed from 50 mg QID to 100 mg TID. No other relevant changes. Pt continues to state that she doesn't feel well. Continues to have non-specific symptoms that have been chronic with no acute changes. Denis any complains of CP, HA, SOB, palpitations. Reviewed with pt and recommended continued monitoring.

## 2020-09-07 NOTE — Telephone Encounter (Signed)
Patient called this monring and said that her BP has been running high and she does not feel well. 143/79/ HR 71

## 2020-09-08 ENCOUNTER — Ambulatory Visit: Payer: Medicare Other | Attending: Internal Medicine

## 2020-09-08 ENCOUNTER — Other Ambulatory Visit: Payer: Self-pay

## 2020-09-08 VITALS — BP 140/78 | HR 70

## 2020-09-08 DIAGNOSIS — R2689 Other abnormalities of gait and mobility: Secondary | ICD-10-CM | POA: Diagnosis not present

## 2020-09-08 DIAGNOSIS — M6281 Muscle weakness (generalized): Secondary | ICD-10-CM | POA: Diagnosis not present

## 2020-09-08 DIAGNOSIS — R2681 Unsteadiness on feet: Secondary | ICD-10-CM | POA: Insufficient documentation

## 2020-09-08 NOTE — Therapy (Signed)
[facility] [address] [place], [name], [address] Phone: [phone]   Fax:  [phone]  Physical Therapy Evaluation  Patient Details  Name: Sara Brown MRN: [id number] Date of Birth: [birth year] Referring Provider (PT): Cristal Ford (hospitalist) but PCP is Seward Carol   Encounter Date: 09/08/2020   PT End of Session - 09/08/20 1105    Visit Number 1    Number of Visits 11    Date for PT Re-Evaluation 12/07/20   60 day poc, 90 day cert   Authorization Type Medicare so 10th visit progress note    PT Start Time 1102    PT Stop Time 1152    PT Time Calculation (min) 50 min    Equipment Utilized During Treatment Gait belt    Activity Tolerance Patient tolerated treatment well    Behavior During Therapy [facility] for tasks assessed/performed           Past Medical History:  Diagnosis Date  . Allergic rhinitis   . Arthritis    "fingers, knees" (02/09/2018)  . Asthma    "stopped taking RX after dr said I don't have this" (02/09/2018)  . Carotid stenosis    ICA(L)  . Complication of anesthesia    "felt the cut w/one of my c-sections" (02/09/2018)  . Constipation   . GERD (gastroesophageal reflux disease)   . Headache    "years ago; before menopause" (02/09/2018)  . Hyperlipidemia   . Hypertension   . Neuropathy    face (R)  . OA (osteoarthritis)   . Osteoporosis   . TIA (transient ischemic attack) ?2005  . Type II diabetes mellitus ([place])   . Vitamin D deficiency     Past Surgical History:  Procedure Laterality Date  . CATARACT EXTRACTION W/ INTRAOCULAR LENS  IMPLANT, BILATERAL Bilateral   . CESAREAN SECTION  1971; 1975  . INTRAVASCULAR PRESSURE WIRE/FFR STUDY N/A 04/01/2018   Procedure: INTRAVASCULAR PRESSURE WIRE/FFR STUDY;  Surgeon: Nigel Mormon, MD;  Location: [facility];  Service: Cardiovascular;  Laterality: N/A;  . LEFT HEART CATH AND CORONARY ANGIOGRAPHY N/A 04/01/2018    Procedure: LEFT HEART CATH AND CORONARY ANGIOGRAPHY;  Surgeon: Nigel Mormon, MD;  Location: [facility];  Service: Cardiovascular;  Laterality: N/A;  . TUBAL LIGATION  1975    Vitals:   09/08/20 1120 09/08/20 1147  BP: 138/82 140/78  Pulse: 70   SpO2: 98%       Subjective Assessment - 09/08/20 1107    Subjective Pt was referred for parasthesias. She reports that she is having trouble with her legs and not walking right. She reports that she has been stumbling a bit more the last week. Was hospitalized 3/28-3/30 for left sided paresthesia, acute on chronic/ Possible seizure.  Pt reports that she is just feeling funny today. Thinks it could be her blood pressure.    Patient is accompained by: Family member   husband   Pertinent History 80 y.o. female with medical history significant of hypertension, hyperlipidemia, nonobstructive CAD, left carotid stenosis, TIA, diabetes mellitus type 2, asthma, vitamin D deficiency, and GERD who with complaints of a weird sensation on her left side.    Patient Stated Goals Pt would like to be able to walk better as she likes to garden.    Currently in Pain? No/denies              [facility] PT Assessment - 09/08/20 1114  Assessment   Medical Diagnosis paresthesias    Referring Provider (PT) Cristal Ford (hospitalist) but PCP is Seward Carol    Onset Date/Surgical Date 09/04/20    Hand Dominance Right    Prior Therapy hospital      Precautions   Precautions Fall      Balance Screen   Has the patient fallen in the past 6 months No    Has the patient had a decrease in activity level because of a fear of falling?  Yes    Is the patient reluctant to leave their home because of a fear of falling?  No      Home Environment   Living Environment Private residence    Living Arrangements Spouse/significant other    Available Help at Discharge Family    Type of [place] to enter    Entrance Stairs-Number of  Steps 1 step    Entrance Stairs-Rails None    Home Layout One level   has one step inside   Chief Financial Officer seat;Cane - single point      Prior Function   Level of [place];Independent with basic ADLs;Independent with community mobility without device    Vocation Retired    Leisure garden, sing and play piano      Cognition   Overall Cognitive Status Within Functional Limits for tasks assessed      Sensation   Light Touch Appears Intact    Additional Comments intact light touch in arms and legs. Pt reports right arm just has been feeling funny all morning.      Coordination   Gross Motor Movements are Fluid and Coordinated Yes    Fine Motor Movements are Fluid and Coordinated Yes      ROM / Strength   AROM / PROM / Strength Strength      Strength   Overall Strength Comments Strength grossly 4+/5 throughout except hip flexion 4/5 bilateral and shoulder flexion=4/5      Transfers   Transfers Sit to Stand;Stand to Sit    Sit to Stand 5: Supervision    Five time sit to stand comments  12.34 sec without hands on mat    Stand to Sit 5: Supervision      Ambulation/Gait   Ambulation/Gait Yes    Ambulation/Gait Assistance 5: Supervision    Ambulation/Gait Assistance Details Pt did have a couple episodes of knees buckling during testing when performing balance tasks. Reports knees just bother her sometimes.    Ambulation Distance (Feet) 200 Feet    Assistive device None    Gait Pattern Step-through pattern    Ambulation Surface Level;Indoor    Gait velocity 9.07 sec=1.10 m/s    Stairs Yes    Stairs Assistance 5: Supervision    Stair Management Technique Two rails;Alternating pattern    Number of Stairs 4    Height of Stairs 6      Functional Gait  Assessment   Gait assessed  Yes    Gait Level Surface Walks 20 ft in less than 7 sec but greater than 5.5 sec, uses assistive device, slower speed, mild gait deviations, or deviates 6-10 in outside of the 12 in  walkway width.    Change in Gait Speed Able to smoothly change walking speed without loss of balance or gait deviation. Deviate no more than 6 in outside of the 12 in walkway width.    Gait with Horizontal Head Turns Performs head turns smoothly  with slight change in gait velocity (eg, minor disruption to smooth gait path), deviates 6-10 in outside 12 in walkway width, or uses an assistive device.    Gait with Vertical Head Turns Performs task with moderate change in gait velocity, slows down, deviates 10-15 in outside 12 in walkway width but recovers, can continue to walk.   had a couple episodes of left knee almost buckling   Gait and Pivot Turn Pivot turns safely within 3 sec and stops quickly with no loss of balance.    Step Over Obstacle Is able to step over 2 stacked shoe boxes taped together (9 in total height) without changing gait speed. No evidence of imbalance.    Gait with Narrow Base of Support Ambulates 4-7 steps.    Gait with Eyes Closed Walks 20 ft, slow speed, abnormal gait pattern, evidence for imbalance, deviates 10-15 in outside 12 in walkway width. Requires more than 9 sec to ambulate 20 ft.   would bend excessivly at left knee during stance and kick out right leg to try to balance   Ambulating Backwards Walks 20 ft, uses assistive device, slower speed, mild gait deviations, deviates 6-10 in outside 12 in walkway width.    Steps Alternating feet, must use rail.    Total Score 20                      Objective measurements completed on examination: See above findings.               PT Education - 09/08/20 1845    Education Details Discussed PT plan of care    Person(s) Educated Patient;Spouse    Methods Explanation    Comprehension Verbalized understanding            PT Short Term Goals - 09/08/20 1858      PT SHORT TERM GOAL #1   Title Pt will be independent with initial HEP for balance.    Time 4    Period Weeks    Status New    Target  Date 10/08/20      PT SHORT TERM GOAL #2   Title Pt will decrease 5 x sit to stand from 12.34 sec to <11 sec for improved balance and functional strength.    Baseline 09/08/20 12.34 sec from mat without hands    Time 4    Period Weeks    Status New    Target Date 10/08/20             PT Long Term Goals - 09/08/20 1901      PT LONG TERM GOAL #1   Title Pt will ambulate >1000' on varied surfaces independently for improved community mobility.    Time 8    Period Weeks    Status New    Target Date 11/07/20      PT LONG TERM GOAL #2   Title Pt will increase FGA from 20 to >24/30 for improved balance and gait safety.    Baseline 09/08/20 20/30    Time 8    Period Weeks    Status New    Target Date 11/07/20      PT LONG TERM GOAL #3   Title Pt will be able to perform gardening simulation activities without LOB for improved function and functional strength.    Time 8    Period Weeks    Status New    Target Date 11/07/20  Plan - 09/08/20 1847    Clinical Impression Statement Pt is 80 y/o female with recent hospitalization for left sided paresthesia, acute on chronic/ possible seizure. Her strength is grossly 4+/5 other than hip flexion 4/5 in BLE. Pt presents with continued reports of parasthesias today involving right arm more. Vitals were WNL. Pt's walking varied throughout session independent at times and then supervision/CGA other times. She has some episodes of knees buckling when performing dynamic gait activities. Pt is fall risk based on 5 x sit to stand of 12.34 sec and FGA of 20/30. She is walking at safe community ambulator speed of 1.68m/s. Pt will benefit from skilled PT to address balance, strength and functional mobility deficits.    Personal Factors and Comorbidities Comorbidity 3+    Comorbidities hypertension, hyperlipidemia, nonobstructive CAD, left carotid stenosis, TIA, diabetes mellitus type 2, asthma, vitamin D deficiency, and GERD     Examination-Activity Limitations Locomotion Level;Stairs    Examination-Participation Restrictions Community Activity;Yard Work    Merchant navy officer Evolving/Moderate complexity    Clinical Decision Making Moderate    Rehab Potential Good    PT Frequency 2x / week   followed by 1x/week for 6 weeks   PT Duration 2 weeks    PT Treatment/Interventions ADLs/Self Care Home Management;DME Instruction;Gait training;Stair training;Functional mobility training;Therapeutic activities;Balance training;Neuromuscular re-education    PT Next Visit Plan Monitor vitals with activity. Start balance training with high level activities. Watch how knees do as had some random buckling noted during eval.    Consulted and Agree with Plan of Care Patient;Family member/caregiver    Family Member Consulted husband           Patient will benefit from skilled therapeutic intervention in order to improve the following deficits and impairments:  Abnormal gait,Decreased balance,Decreased mobility,Decreased strength  Visit Diagnosis: Other abnormalities of gait and mobility  Muscle weakness (generalized)  Unsteadiness on feet     Problem List Patient Active Problem List   Diagnosis Date Noted  . Paresthesias 09/05/2020  . Age-related osteoporosis without current pathological fracture 08/18/2020  . Pure hypercholesterolemia 08/18/2020  . Chronic constipation 08/18/2020  . Right ear impacted cerumen 10/26/2019  . CAD (coronary artery disease) 09/17/2018  . Diabetes mellitus type 2, controlled ([place]) 03/31/2018  . Essential hypertension 03/31/2018  . Dyslipidemia 03/31/2018  . Chest pain 02/10/2018  . Chest pain due to coronary artery disease ([place]) 02/09/2018  . Angina at rest [facility]) 02/09/2018  . Bradycardia 02/09/2018  . Skin laxity 06/12/2017  . Hirsutism 06/12/2017  . Hyperpigmentation 02/13/2017  . Neck mass 01/17/2017  . 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 Asymmetrical hearing  loss of left ear 02/21/2016  . Ear pain, left 02/21/2016  . GERD (gastroesophageal reflux disease)   . Shortness of breath 07/30/2007  . Cough 07/30/2007    Electa Sniff, PT, DPT, NCS 09/08/2020, 7:05 PM  [facility] [address] [place], [name], [address] Phone: [phone]   Fax:  [phone]  Name: Sara Brown MRN: [id number] Date of Birth: 05-Feb-1941

## 2020-09-09 DIAGNOSIS — I10 Essential (primary) hypertension: Secondary | ICD-10-CM | POA: Diagnosis not present

## 2020-09-11 ENCOUNTER — Encounter: Payer: Self-pay | Admitting: Dermatology

## 2020-09-11 NOTE — Progress Notes (Signed)
   Follow-Up Visit   Subjective  Sara Brown is a 80 y.o. female who presents for the following: Skin Problem (Blotches or red patches/spots - comes and go on back & sides- the skin peels off -  tx- none).  Breaking out on sides and back. Location:  Duration:  Quality:  Associated Signs/Symptoms: Modifying Factors:  Severity:  Timing: Context:   Objective  Well appearing patient in no apparent distress; mood and affect are within normal limits. Objective  Left Lower Back, Right Lower Back: Nonspecific patchy fine scale with minimal inflammation and postinflammatory hyperpigmentation.  Sara Brown your had been seen previously both by me and dermatologist at [facility] and a diagnosis of possible drug allergy had been made.  Today this looks more like an endogenous process.    A focused examination was performed including Torso, head and, neck.. Relevant physical exam findings are noted in the Assessment and Plan.   Assessment & Plan    Dermatitis (2) Left Lower Back; Right Lower Back  Currently not very bothersome to patient so we have held on initiating any intervention, but she knows if this gets worse she can contact me.      I, Lavonna Monarch, MD, have reviewed Brown documentation for this visit.  The documentation on 09/11/20 for the exam, diagnosis, procedures, and orders are Brown accurate and complete.

## 2020-09-12 DIAGNOSIS — I10 Essential (primary) hypertension: Secondary | ICD-10-CM | POA: Diagnosis not present

## 2020-09-12 DIAGNOSIS — R200 Anesthesia of skin: Secondary | ICD-10-CM | POA: Diagnosis not present

## 2020-09-12 DIAGNOSIS — R9401 Abnormal electroencephalogram [EEG]: Secondary | ICD-10-CM | POA: Diagnosis not present

## 2020-09-12 DIAGNOSIS — E7800 Pure hypercholesterolemia, unspecified: Secondary | ICD-10-CM | POA: Diagnosis not present

## 2020-09-12 DIAGNOSIS — G459 Transient cerebral ischemic attack, unspecified: Secondary | ICD-10-CM | POA: Diagnosis not present

## 2020-09-12 DIAGNOSIS — E1169 Type 2 diabetes mellitus with other specified complication: Secondary | ICD-10-CM | POA: Diagnosis not present

## 2020-09-12 DIAGNOSIS — I700 Atherosclerosis of aorta: Secondary | ICD-10-CM | POA: Diagnosis not present

## 2020-09-13 ENCOUNTER — Other Ambulatory Visit: Payer: Self-pay

## 2020-09-13 ENCOUNTER — Ambulatory Visit: Payer: Medicare Other

## 2020-09-13 VITALS — BP 158/60

## 2020-09-13 DIAGNOSIS — R2689 Other abnormalities of gait and mobility: Secondary | ICD-10-CM

## 2020-09-13 DIAGNOSIS — R2681 Unsteadiness on feet: Secondary | ICD-10-CM

## 2020-09-13 DIAGNOSIS — M6281 Muscle weakness (generalized): Secondary | ICD-10-CM

## 2020-09-13 DIAGNOSIS — Z23 Encounter for immunization: Secondary | ICD-10-CM | POA: Diagnosis not present

## 2020-09-13 NOTE — Therapy (Signed)
[facility] [address] [place], [name], [address] Phone: [phone]   Fax:  [phone]  Physical Therapy Treatment  Patient Details  Name: Sara Brown MRN: [id number] Date of Birth: [birth year] Referring Provider (PT): Cristal Ford (hospitalist) but PCP is Seward Carol   Encounter Date: 09/13/2020   PT End of Session - 09/13/20 1030    Visit Number 2    Number of Visits 11    Date for PT Re-Evaluation 12/07/20   60 day poc, 90 day cert   Authorization Type Medicare so 10th visit progress note    PT Start Time 0852    PT Stop Time 0932    PT Time Calculation (min) 40 min    Equipment Utilized During Treatment Gait belt    Activity Tolerance Patient tolerated treatment well    Behavior During Therapy [facility] for tasks assessed/performed           Past Medical History:  Diagnosis Date  . Allergic rhinitis   . Arthritis    "fingers, knees" (02/09/2018)  . Asthma    "stopped taking RX after dr said I don't have this" (02/09/2018)  . Carotid stenosis    ICA(L)  . Complication of anesthesia    "felt the cut w/one of my c-sections" (02/09/2018)  . Constipation   . GERD (gastroesophageal reflux disease)   . Headache    "years ago; before menopause" (02/09/2018)  . Hyperlipidemia   . Hypertension   . Neuropathy    face (R)  . OA (osteoarthritis)   . Osteoporosis   . TIA (transient ischemic attack) ?2005  . Type II diabetes mellitus ([place])   . Vitamin D deficiency     Past Surgical History:  Procedure Laterality Date  . CATARACT EXTRACTION W/ INTRAOCULAR LENS  IMPLANT, BILATERAL Bilateral   . CESAREAN SECTION  1971; 1975  . INTRAVASCULAR PRESSURE WIRE/FFR STUDY N/A 04/01/2018   Procedure: INTRAVASCULAR PRESSURE WIRE/FFR STUDY;  Surgeon: Nigel Mormon, MD;  Location: [facility];  Service: Cardiovascular;  Laterality: N/A;  . LEFT HEART CATH AND CORONARY ANGIOGRAPHY N/A 04/01/2018    Procedure: LEFT HEART CATH AND CORONARY ANGIOGRAPHY;  Surgeon: Nigel Mormon, MD;  Location: [facility];  Service: Cardiovascular;  Laterality: N/A;  . TUBAL LIGATION  1975    Vitals:   09/13/20 0858 09/13/20 0932  BP: (!) 152/62 (!) 158/60     Subjective Assessment - 09/13/20 0856    Subjective Pt reports her knees "do not feel right" but she is not currently in any pain.  She says she was able to be in her garden a few hours the other day and says that felt great.  She is still having those parasthesia feelings currnetly on her tongue and lips.    Patient is accompained by: Family member   husband   Pertinent History 80 y.o. female with medical history significant of hypertension, hyperlipidemia, nonobstructive CAD, left carotid stenosis, TIA, diabetes mellitus type 2, asthma, vitamin D deficiency, and GERD who with complaints of a weird sensation on her left side.    Patient Stated Goals Pt would like to be able to walk better as she likes to garden.    Currently in Pain? No/denies                             [facility] Adult PT Treatment/Exercise - 09/13/20 1025  Ambulation/Gait   Ambulation/Gait Yes    Ambulation/Gait Assistance 5: Supervision;4: Min guard    Ambulation/Gait Assistance Details without AD; pt wanted to walk fast; no notes of instability or knee buckling    Ambulation Distance (Feet) 230 Feet    Assistive device None    Gait Pattern Step-through pattern    Ambulation Surface Level;Indoor               Balance Exercises - 09/13/20 0907      Balance Exercises: Standing   SLS with Vectors Solid surface;Limitations   stepping to 3 squishy colored dots, 90,60, 45; 2x5 each leg;   SLS with Vectors Limitations verbal ceuing to slow down between each step, maintain balance and maintain neutral pelvic alignment; intermitent instability when pt rushed exercise, most difficulty with 45deg dot    Turning Both;Limitations   Therapist  cueing to turn R or L on command as pt walks normal gait speed   Turning Limitations Pt able to turn easier to L compared to R; no significant instability noted with turns, narrow turning radius and slight foot shuffling turning turns    Step Over Hurdles / Cones x6 hurdles of various sizes, x2 laps forward, x4 laps sideways; verbal cueing needed to maintain neutral pelvic alignment    Marching Solid surface;Dynamic;Limitations   x115'; CGA   Marching Limitations verbal cueing to slow down and be conscious about movements; slight instability noted, no knee buckling but R hip IR noted with some steps    Other Standing Exercises Backwards walking 4x50ft - pt able to maintain proper step length and maintain neutral hip and trunk alignment             PT Education - 09/13/20 1030    Education Details Maintaining neutral pelvic alignment with exercises, slowing down to maintain balance and control over movements    Person(s) Educated Patient    Methods Explanation    Comprehension Verbalized understanding            PT Short Term Goals - 09/08/20 1858      PT SHORT TERM GOAL #1   Title Pt will be independent with initial HEP for balance.    Time 4    Period Weeks    Status New    Target Date 10/08/20      PT SHORT TERM GOAL #2   Title Pt will decrease 5 x sit to stand from 12.34 sec to <11 sec for improved balance and functional strength.    Baseline 09/08/20 12.34 sec from mat without hands    Time 4    Period Weeks    Status New    Target Date 10/08/20             PT Long Term Goals - 09/08/20 1901      PT LONG TERM GOAL #1   Title Pt will ambulate >1000' on varied surfaces independently for improved community mobility.    Time 8    Period Weeks    Status New    Target Date 11/07/20      PT LONG TERM GOAL #2   Title Pt will increase FGA from 20 to >24/30 for improved balance and gait safety.    Baseline 09/08/20 20/30    Time 8    Period Weeks    Status New     Target Date 11/07/20      PT LONG TERM GOAL #3   Title Pt will be able to perform  gardening simulation activities without LOB for improved function and functional strength.    Time 8    Period Weeks    Status New    Target Date 11/07/20                 Plan - 09/13/20 1031    Clinical Impression Statement Began exercises to challenge balance, single leg stance time, and improve LE alignment.  Pt displayed intermittent bouts of instability during exercises and required sitting rest breaks between exercises.  No excessive amount of knee buckling noted during session today although pt reported knees felt abnormal compared to other days.  Verbal cueing required to maintain pelvic alignment as pt displayed excessive hip ER during stepping exercises.  Constant verbal cueing needed to slow movements and be conscious of body control.  Pt able to self correct after discussion of proper LE alignment and slowing to be efficient with movements.    Personal Factors and Comorbidities Comorbidity 3+    Comorbidities hypertension, hyperlipidemia, nonobstructive CAD, left carotid stenosis, TIA, diabetes mellitus type 2, asthma, vitamin D deficiency, and GERD    Examination-Activity Limitations Locomotion Level;Stairs    Examination-Participation Restrictions Community Activity;Yard Work    Merchant navy officer Evolving/Moderate complexity    Rehab Potential Good    PT Frequency 2x / week   followed by 1x/week for 6 weeks   PT Duration 2 weeks    PT Treatment/Interventions ADLs/Self Care Home Management;DME Instruction;Gait training;Stair training;Functional mobility training;Therapeutic activities;Balance training;Neuromuscular re-education    PT Next Visit Plan Monitor vitals with activity. Start balance training with high level activities. Watch how knees do as had some random buckling noted during eval.    PT Home Exercise Plan Continue with higher level balance, SL and stepping  exercises. Hip and glute strengthening - stairs/steps    Consulted and Agree with Plan of Care Patient;Family member/caregiver    Family Member Consulted husband           Patient will benefit from skilled therapeutic intervention in order to improve the following deficits and impairments:  Abnormal gait,Decreased balance,Decreased mobility,Decreased strength  Visit Diagnosis: Other abnormalities of gait and mobility  Muscle weakness (generalized)  Unsteadiness on feet     Problem List Patient Active Problem List   Diagnosis Date Noted  . Paresthesias 09/05/2020  . Age-related osteoporosis without current pathological fracture 08/18/2020  . Pure hypercholesterolemia 08/18/2020  . Chronic constipation 08/18/2020  . Right ear impacted cerumen 10/26/2019  . CAD (coronary artery disease) 09/17/2018  . Diabetes mellitus type 2, controlled ([place]) 03/31/2018  . Essential hypertension 03/31/2018  . Dyslipidemia 03/31/2018  . Chest pain 02/10/2018  . Chest pain due to coronary artery disease ([place]) 02/09/2018  . Angina at rest [facility]) 02/09/2018  . Bradycardia 02/09/2018  . Skin laxity 06/12/2017  . Hirsutism 06/12/2017  . Hyperpigmentation 02/13/2017  . Neck mass 01/17/2017  . 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 Ear pain, left 02/21/2016  . GERD (gastroesophageal reflux disease)   . Shortness of breath 07/30/2007  . Cough 07/30/2007    Yetta Numbers, SPT 09/13/2020, 10:38 AM  [facility] [address] [place], [name], [address] Phone: [phone]   Fax:  [phone]  Name: Sara Brown MRN: [id number] Date of Birth: [birth year]

## 2020-09-15 ENCOUNTER — Ambulatory Visit: Payer: Medicare Other

## 2020-09-15 ENCOUNTER — Other Ambulatory Visit: Payer: Self-pay

## 2020-09-15 VITALS — BP 128/72

## 2020-09-15 DIAGNOSIS — M6281 Muscle weakness (generalized): Secondary | ICD-10-CM

## 2020-09-15 DIAGNOSIS — R2681 Unsteadiness on feet: Secondary | ICD-10-CM

## 2020-09-15 DIAGNOSIS — R2689 Other abnormalities of gait and mobility: Secondary | ICD-10-CM

## 2020-09-15 NOTE — Therapy (Signed)
[facility] [address] [place], [name], [address] Phone: [phone]   Fax:  [phone]  Physical Therapy Treatment  Patient Details  Name: Sara Brown MRN: [id number] Date of Birth: 1940/11/16 Referring Provider (PT): Cristal Ford (hospitalist) but PCP is Seward Carol   Encounter Date: 09/15/2020   PT End of Session - 09/15/20 0936    Visit Number 3    Number of Visits 11    Date for PT Re-Evaluation 12/07/20   60 day poc, 90 day cert   Authorization Type Medicare so 10th visit progress note    PT Start Time 0933    PT Stop Time 1015    PT Time Calculation (min) 42 min    Equipment Utilized During Treatment Gait belt    Activity Tolerance Patient tolerated treatment well    Behavior During Therapy [facility] for tasks assessed/performed           Past Medical History:  Diagnosis Date  . Allergic rhinitis   . Arthritis    "fingers, knees" (02/09/2018)  . Asthma    "stopped taking RX after dr said I don't have this" (02/09/2018)  . Carotid stenosis    ICA(L)  . Complication of anesthesia    "felt the cut w/one of my c-sections" (02/09/2018)  . Constipation   . GERD (gastroesophageal reflux disease)   . Headache    "years ago; before menopause" (02/09/2018)  . Hyperlipidemia   . Hypertension   . Neuropathy    face (R)  . OA (osteoarthritis)   . Osteoporosis   . TIA (transient ischemic attack) ?2005  . Type II diabetes mellitus ([place])   . Vitamin D deficiency     Past Surgical History:  Procedure Laterality Date  . CATARACT EXTRACTION W/ INTRAOCULAR LENS  IMPLANT, BILATERAL Bilateral   . CESAREAN SECTION  1971; 1975  . INTRAVASCULAR PRESSURE WIRE/FFR STUDY N/A 04/01/2018   Procedure: INTRAVASCULAR PRESSURE WIRE/FFR STUDY;  Surgeon: Nigel Mormon, MD;  Location: [facility];  Service: Cardiovascular;  Laterality: N/A;  . LEFT HEART CATH AND CORONARY ANGIOGRAPHY N/A 04/01/2018    Procedure: LEFT HEART CATH AND CORONARY ANGIOGRAPHY;  Surgeon: Nigel Mormon, MD;  Location: [facility];  Service: Cardiovascular;  Laterality: N/A;  . TUBAL LIGATION  1975    Vitals:   09/15/20 0939  BP: 128/72     Subjective Assessment - 09/15/20 0936    Subjective Pt reports that her left leg has some weird sensations after she does activity. Seems better when she is doing activity. She has started taking her hydralazine 2x/day versus 3 as 3rd dose was so close to clonidine time. PT advised to monitor BP closely and consult with MD.    Patient is accompained by: Family member   husband   Pertinent History 80 y.o. female with medical history significant of hypertension, hyperlipidemia, nonobstructive CAD, left carotid stenosis, TIA, diabetes mellitus type 2, asthma, vitamin D deficiency, and GERD who with complaints of a weird sensation on her left side.    Patient Stated Goals Pt would like to be able to walk better as she likes to garden.    Currently in Pain? No/denies                             [facility] Adult PT Treatment/Exercise - 09/15/20 0942      Ambulation/Gait   Ambulation/Gait Yes  Ambulation/Gait Assistance 5: Supervision    Ambulation/Gait Assistance Details around in [place] between activities    Assistive device None    Gait Pattern Step-through pattern    Ambulation Surface Level;Indoor      Neuro Re-ed    Neuro Re-ed Details  Dynamic gait activities in hallway: marching gait 40' x 2, tandem gait 40'x 2 with some extraneous movements on LLE with some difficulty with placement, gait with head turns horizontal. Over blue mat reciprocal steps over 3 cones x 3 laps down and back with tapping cone prior to stepping over to increase SLS time. Pt had some give on LLE at times. Side stepping over 3 cones x 3 laps down and back. Sit to stand 5 x 2 from elevated mat without hands standing on airex getting balance each time. Reported some pain in  knees and noted audible crepitus upon rising. Helped some with raising mat up some. Standing on airex feet together: eyes closed x 30 sec with increased sway, eyes open with head turns horizontal x 10 and vertical x 10. BP=140/78 at end                    PT Short Term Goals - 09/08/20 1858      PT SHORT TERM GOAL #1   Title Pt will be independent with initial HEP for balance.    Time 4    Period Weeks    Status New    Target Date 10/08/20      PT SHORT TERM GOAL #2   Title Pt will decrease 5 x sit to stand from 12.34 sec to <11 sec for improved balance and functional strength.    Baseline 09/08/20 12.34 sec from mat without hands    Time 4    Period Weeks    Status New    Target Date 10/08/20             PT Long Term Goals - 09/08/20 1901      PT LONG TERM GOAL #1   Title Pt will ambulate >1000' on varied surfaces independently for improved community mobility.    Time 8    Period Weeks    Status New    Target Date 11/07/20      PT LONG TERM GOAL #2   Title Pt will increase FGA from 20 to >24/30 for improved balance and gait safety.    Baseline 09/08/20 20/30    Time 8    Period Weeks    Status New    Target Date 11/07/20      PT LONG TERM GOAL #3   Title Pt will be able to perform gardening simulation activities without LOB for improved function and functional strength.    Time 8    Period Weeks    Status New    Target Date 11/07/20                 Plan - 09/15/20 1955    Clinical Impression Statement PT noted some weakness and decreased control on LLE that varied throughout session. Pt was challenged with increasing SLS on compliant surfaces as well as gait with head turns.    Personal Factors and Comorbidities Comorbidity 3+    Comorbidities hypertension, hyperlipidemia, nonobstructive CAD, left carotid stenosis, TIA, diabetes mellitus type 2, asthma, vitamin D deficiency, and GERD    Examination-Activity Limitations Locomotion Level;Stairs     Examination-Participation Restrictions Community Activity;Yard Work    Production manager  complexity    Rehab Potential Good    PT Frequency 2x / week   followed by 1x/week for 6 weeks   PT Duration 2 weeks    PT Treatment/Interventions ADLs/Self Care Home Management;DME Instruction;Gait training;Stair training;Functional mobility training;Therapeutic activities;Balance training;Neuromuscular re-education    PT Next Visit Plan Monitor vitals with activity. Continue balance training with high level activities. Watch how knees do as had some random buckling epecially on left noted at times. Continue corner balance exercise and add to HEP if does well next session.    Consulted and Agree with Plan of Care Patient;Family member/caregiver    Family Member Consulted husband           Patient will benefit from skilled therapeutic intervention in order to improve the following deficits and impairments:  Abnormal gait,Decreased balance,Decreased mobility,Decreased strength  Visit Diagnosis: Other abnormalities of gait and mobility  Unsteadiness on feet  Muscle weakness (generalized)     Problem List Patient Active Problem List   Diagnosis Date Noted  . Paresthesias 09/05/2020  . Age-related osteoporosis without current pathological fracture 08/18/2020  . Pure hypercholesterolemia 08/18/2020  . Chronic constipation 08/18/2020  . Right ear impacted cerumen 10/26/2019  . CAD (coronary artery disease) 09/17/2018  . Diabetes mellitus type 2, controlled ([place]) 03/31/2018  . Essential hypertension 03/31/2018  . Dyslipidemia 03/31/2018  . Chest pain 02/10/2018  . Chest pain due to coronary artery disease ([place]) 02/09/2018  . Angina at rest [facility]) 02/09/2018  . Bradycardia 02/09/2018  . Skin laxity 06/12/2017  . Hirsutism 06/12/2017  . Hyperpigmentation 02/13/2017  . Neck mass 01/17/2017  . 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 Asymmetrical hearing loss of  left ear 02/21/2016  . Ear pain, left 02/21/2016  . GERD (gastroesophageal reflux disease)   . Shortness of breath 07/30/2007  . Cough 07/30/2007    Electa Sniff, PT, DPT, NCS 09/15/2020, 7:57 PM  [facility] [address] [place], [name], [address] Phone: [phone]   Fax:  [phone]  Name: Tavionna Grout MRN: [id number] Date of Birth: 1941-02-09

## 2020-09-15 NOTE — Patient Instructions (Signed)
Access Code: [email or web address] URL: [email or web address] Date: 09/15/2020 Prepared by: Cherly Anderson  Exercises Walking March - 2 x daily - 7 x weekly - 1 sets - 3-4 reps Tandem Walking with Counter Support - 2 x daily - 7 x weekly - 1 sets - 3-4 reps

## 2020-09-18 ENCOUNTER — Ambulatory Visit: Payer: Medicare Other

## 2020-09-20 ENCOUNTER — Ambulatory Visit: Payer: Medicare Other

## 2020-09-20 DIAGNOSIS — R200 Anesthesia of skin: Secondary | ICD-10-CM | POA: Diagnosis not present

## 2020-09-20 DIAGNOSIS — Z1389 Encounter for screening for other disorder: Secondary | ICD-10-CM | POA: Diagnosis not present

## 2020-09-20 DIAGNOSIS — M810 Age-related osteoporosis without current pathological fracture: Secondary | ICD-10-CM | POA: Diagnosis not present

## 2020-09-20 DIAGNOSIS — I10 Essential (primary) hypertension: Secondary | ICD-10-CM | POA: Diagnosis not present

## 2020-09-20 DIAGNOSIS — Z0000 Encounter for general adult medical examination without abnormal findings: Secondary | ICD-10-CM | POA: Diagnosis not present

## 2020-09-20 DIAGNOSIS — H40023 Open angle with borderline findings, high risk, bilateral: Secondary | ICD-10-CM | POA: Diagnosis not present

## 2020-09-20 DIAGNOSIS — H04123 Dry eye syndrome of bilateral lacrimal glands: Secondary | ICD-10-CM | POA: Diagnosis not present

## 2020-09-20 DIAGNOSIS — I700 Atherosclerosis of aorta: Secondary | ICD-10-CM | POA: Diagnosis not present

## 2020-09-20 DIAGNOSIS — E1169 Type 2 diabetes mellitus with other specified complication: Secondary | ICD-10-CM | POA: Diagnosis not present

## 2020-09-20 DIAGNOSIS — R9401 Abnormal electroencephalogram [EEG]: Secondary | ICD-10-CM | POA: Diagnosis not present

## 2020-09-20 DIAGNOSIS — F419 Anxiety disorder, unspecified: Secondary | ICD-10-CM | POA: Diagnosis not present

## 2020-09-20 DIAGNOSIS — E7800 Pure hypercholesterolemia, unspecified: Secondary | ICD-10-CM | POA: Diagnosis not present

## 2020-09-20 DIAGNOSIS — H16223 Keratoconjunctivitis sicca, not specified as Sjogren's, bilateral: Secondary | ICD-10-CM | POA: Diagnosis not present

## 2020-09-20 DIAGNOSIS — H35033 Hypertensive retinopathy, bilateral: Secondary | ICD-10-CM | POA: Diagnosis not present

## 2020-09-21 ENCOUNTER — Ambulatory Visit: Payer: Medicare Other

## 2020-09-21 ENCOUNTER — Other Ambulatory Visit: Payer: Self-pay

## 2020-09-21 VITALS — BP 102/53 | HR 76

## 2020-09-21 DIAGNOSIS — R2689 Other abnormalities of gait and mobility: Secondary | ICD-10-CM

## 2020-09-21 DIAGNOSIS — M6281 Muscle weakness (generalized): Secondary | ICD-10-CM

## 2020-09-21 DIAGNOSIS — R2681 Unsteadiness on feet: Secondary | ICD-10-CM | POA: Diagnosis not present

## 2020-09-21 NOTE — Patient Instructions (Signed)
Access Code: [email or web address] URL: [email or web address] Date: 09/21/2020 Prepared by: Baldomero Lamy  Exercises Walking March - 2 x daily - 7 x weekly - 1 sets - 3-4 reps Tandem Walking with Counter Support - 2 x daily - 7 x weekly - 1 sets - 3-4 reps Romberg Stance on Foam Pad with Head Rotation - 1 x daily - 7 x weekly - 2 sets - 10 reps Romberg Stance with Head Nods on Foam Pad - 1 x daily - 7 x weekly - 2 sets - 10 reps Standing Balance with Eyes Closed on Foam - 1 x daily - 7 x weekly - 1 sets - 3 reps - 30 hold

## 2020-09-21 NOTE — Therapy (Signed)
Sara Brown [address] [place], [name], [address] Phone: [phone]   Fax:  [phone]  Physical Therapy Treatment  Patient Details  Name: Sara Brown MRN: [id number] Date of Birth: [birth year] Referring Provider (PT): Sara Brown (hospitalist) but PCP is Sara Brown   Encounter Date: 09/21/2020   PT End of Session - 09/21/20 0933    Visit Number 4    Number of Visits 11    Date for PT Re-Evaluation 12/07/20   60 day poc, 90 day cert   Authorization Type Medicare so 10th visit progress note    PT Start Time 0934    PT Stop Time 1015    PT Time Calculation (min) 41 min    Equipment Utilized During Treatment Gait belt    Activity Tolerance Patient tolerated treatment well    Behavior During Therapy [facility] for tasks assessed/performed           Past Medical History:  Diagnosis Date  . Allergic rhinitis   . Arthritis    "fingers, knees" (02/09/2018)  . Asthma    "stopped taking RX after dr said I don't have this" (02/09/2018)  . Carotid stenosis    ICA(L)  . Complication of anesthesia    "felt the cut w/one of my c-sections" (02/09/2018)  . Constipation   . GERD (gastroesophageal reflux disease)   . Headache    "years ago; before menopause" (02/09/2018)  . Hyperlipidemia   . Hypertension   . Neuropathy    face (R)  . OA (osteoarthritis)   . Osteoporosis   . TIA (transient ischemic attack) ?2005  . Type II diabetes mellitus ([place])   . Vitamin D deficiency     Past Surgical History:  Procedure Laterality Date  . CATARACT EXTRACTION W/ INTRAOCULAR LENS  IMPLANT, BILATERAL Bilateral   . CESAREAN SECTION  1971; 1975  . INTRAVASCULAR PRESSURE WIRE/FFR STUDY N/A 04/01/2018   Procedure: INTRAVASCULAR PRESSURE WIRE/FFR STUDY;  Surgeon: Sara Mormon, MD;  Location: [facility];  Service: Cardiovascular;  Laterality: N/A;  . LEFT HEART CATH AND CORONARY ANGIOGRAPHY N/A 04/01/2018    Procedure: LEFT HEART CATH AND CORONARY ANGIOGRAPHY;  Surgeon: Sara Mormon, MD;  Location: [facility];  Service: Cardiovascular;  Laterality: N/A;  . TUBAL LIGATION  1975    Vitals:   09/21/20 0940  BP: (!) 102/53  Pulse: 76     Subjective Assessment - 09/21/20 0936    Subjective Patient reports that her BP was slightly elevated this morning.  Did take BP medications this morning. Patient report that other than that no other new changes/complaints.    Patient is accompained by: Family member   husband   Pertinent History 80 y.o. female with medical history significant of hypertension, hyperlipidemia, nonobstructive CAD, left carotid stenosis, TIA, diabetes mellitus type 2, asthma, vitamin D deficiency, and GERD who with complaints of a weird sensation on her left side.    Patient Stated Goals Pt would like to be able to walk better as she likes to garden.    Currently in Pain? Yes    Pain Score 3     Pain Location Knee    Pain Orientation Right;Left    Pain Descriptors / Indicators Aching    Pain Type Chronic pain    Pain Onset In the past 7 days    Aggravating Factors  weather    Pain Relieving Factors rest  [facility] Adult PT Treatment/Exercise - 09/21/20 0001      Ambulation/Gait   Ambulation/Gait Yes    Ambulation/Gait Assistance 5: Supervision    Ambulation/Gait Assistance Details around in clinic with activities    Ambulation Distance (Feet) --   clinic distances   Assistive device None    Gait Pattern Step-through pattern    Ambulation Surface Level;Indoor      High Level Balance   High Level Balance Activities Backward walking;Tandem walking;Marching forwards    High Level Balance Comments In hallway (approx 40' each way): completed forward tandem walking x 2 laps with intermittent CGA and UE support from wall.  Completed marching forward x 1 lap, and followed by backwards walking x 2 laps. PT providing vebral cues for improved step length  with backward walking as patient demonstrating step to pattern but unable to properly completed with cues. intermittent imbalance requiring close CGA.      Neuro Re-ed    Neuro Re-ed Details  In hallway: with 4 cones completed laternating step over with toe to promote SLS x 4 laps down and back. Verbal cues to step over with trailing leg vs. circumduction. CGA throughout completion. One instance of imbalance.             Balance Exercises - 09/21/20 0001      Balance Exercises: Standing   Standing Eyes Opened Narrow base of support (BOS);Head turns;Foam/compliant surface;Limitations;Wide (BOA)    Standing Eyes Opened Limitations compelted standing wide BOS and narrow BOS, completed horizontal/vertical head turns x 10 reps each direction. increased postural sway with narros BOS    Standing Eyes Closed Wide (BOA);Foam/compliant surface;3 reps;30 secs;Limitations    Standing Eyes Closed Limitations completed standing with wide BOS completed eyes closed 3 x 30 seconds.    Other Standing Exercises Comments BP after balance activities: 102/53, HR: 73. PT educating patient to complete balance exercises in a corner in home for safety with completion.             PT Education - 09/21/20 1108    Education Details Added Corner Balance to [employer]) Educated Patient;Spouse    Methods Explanation;Demonstration;Handout    Comprehension Returned demonstration;Verbalized understanding           ccess Code: [email or web address] URL: [email or web address] Date: 09/21/2020 Prepared by: Sara Brown  Exercises Walking March - 2 x daily - 7 x weekly - 1 sets - 3-4 reps Tandem Walking with Counter Support - 2 x daily - 7 x weekly - 1 sets - 3-4 reps Romberg Stance on Foam Pad with Head Rotation - 1 x daily - 7 x weekly - 2 sets - 10 reps Romberg Stance with Head Nods on Foam Pad - 1 x daily - 7 x weekly - 2 sets - 10 reps Standing Balance with Eyes Closed on Foam - 1 x daily - 7 x  weekly - 1 sets - 3 reps - 30 hold    PT Short Term Goals - 09/08/20 1858      PT SHORT TERM GOAL #1   Title Pt will be independent with initial HEP for balance.    Time 4    Period Weeks    Status New    Target Date 10/08/20      PT SHORT TERM GOAL #2   Title Pt will decrease 5 x sit to stand from 12.34 sec to <11 sec for improved balance and functional strength.    Baseline 09/08/20 12.34 sec from mat  without hands    Time 4    Period Weeks    Status New    Target Date 10/08/20             PT Long Term Goals - 09/08/20 1901      PT LONG TERM GOAL #1   Title Pt will ambulate >1000' on varied surfaces independently for improved community mobility.    Time 8    Period Weeks    Status New    Target Date 11/07/20      PT LONG TERM GOAL #2   Title Pt will increase FGA from 20 to >24/30 for improved balance and gait safety.    Baseline 09/08/20 20/30    Time 8    Period Weeks    Status New    Target Date 11/07/20      PT LONG TERM GOAL #3   Title Pt will be able to perform gardening simulation activities without LOB for improved function and functional strength.    Time 8    Period Weeks    Status New    Target Date 11/07/20                 Plan - 09/21/20 1109    Clinical Impression Statement Continued corner balance activities with focus on head turns and vision removed. Increased postural sway noted, added to HEP and educated on proper completion for safety. Continued activites working on improved SLS and high level balance, intermittent CGA required due to imbalance. Will continue to progress toward all LTGs.    Personal Factors and Comorbidities Comorbidity 3+    Comorbidities hypertension, hyperlipidemia, nonobstructive CAD, left carotid stenosis, TIA, diabetes mellitus type 2, asthma, vitamin D deficiency, and GERD    Examination-Activity Limitations Locomotion Level;Stairs    Examination-Participation Restrictions Community Activity;Yard Work     Merchant navy officer Evolving/Moderate complexity    Rehab Potential Good    PT Frequency 2x / week   followed by 1x/week for 6 weeks   PT Duration 2 weeks    PT Treatment/Interventions ADLs/Self Care Home Management;DME Instruction;Gait training;Stair training;Functional mobility training;Therapeutic activities;Balance training;Neuromuscular re-education    PT Next Visit Plan How was addition of corner balance activities? Continue balance training with high level activities. Watch how knees do as had some random buckling epecially on left noted at times.    Consulted and Agree with Plan of Care Patient;Family member/caregiver    Family Member Consulted husband           Patient will benefit from skilled therapeutic intervention in order to improve the following deficits and impairments:  Abnormal gait,Decreased balance,Decreased mobility,Decreased strength  Visit Diagnosis: Other abnormalities of gait and mobility  Unsteadiness on feet  Muscle weakness (generalized)     Problem List Patient Active Problem List   Diagnosis Date Noted  . Paresthesias 09/05/2020  . Age-related osteoporosis without current pathological fracture 08/18/2020  . Pure hypercholesterolemia 08/18/2020  . Chronic constipation 08/18/2020  . Right ear impacted cerumen 10/26/2019  . CAD (coronary artery disease) 09/17/2018  . Diabetes mellitus type 2, controlled ([place]) 03/31/2018  . Essential hypertension 03/31/2018  . Dyslipidemia 03/31/2018  . Chest pain 02/10/2018  . Chest pain due to coronary artery disease ([place]) 02/09/2018  . Angina at rest [facility]) 02/09/2018  . Bradycardia 02/09/2018  . Skin laxity 06/12/2017  . Hirsutism 06/12/2017  . Hyperpigmentation 02/13/2017  . Neck mass 01/17/2017  . 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 Asymmetrical hearing loss of  left ear 02/21/2016  . Ear pain, left 02/21/2016  . GERD (gastroesophageal reflux disease)   . Shortness of breath 07/30/2007   . Cough 07/30/2007    Jones Bales, PT, DPT 09/21/2020, 11:15 AM  [facility] [address] [place], [name], [address] Phone: [phone]   Fax:  [phone]  Name: Sara Brown MRN: [id number] Date of Birth: Dec 06, 1940

## 2020-09-22 ENCOUNTER — Telehealth: Payer: Self-pay | Admitting: Student

## 2020-09-22 DIAGNOSIS — I10 Essential (primary) hypertension: Secondary | ICD-10-CM

## 2020-09-22 NOTE — Telephone Encounter (Signed)
Last night 11:59 pm took clonidine  Check BP this morning 149/86 mmHg - took hydralazine, spironolactone, isosorbide, ASA  10:58 am 145/81 mmHg  Tongue and mouth sensation   ON-CALL CARDIOLOGY 09/22/20 12:14 PM  Patient's name: Sara Brown [facility].   MRN: [id number].    DOB: 05-07-1941 Primary care provider: Seward Carol, MD. Primary cardiologist: Dr. Jorge Ny regarding this patient's care today: Patient called with concerns of elevated blood pressure readings with home monitor reading 149/86 mmHg this morning.  Patient also reports abnormal sensations of her tongue and vision.  Patient has had frequent episodes of similar symptoms and concerns.  Blood pressure elevations tend to be associated with episodes of health associated anxiety.  Patient denies chest pain, palpitations, dizziness, syncope, near syncope.  Denies symptoms suggestive of TIA or CVA.  Notably since hospitalization patient's hydralazine has been changed from 50 mg 4 times daily to 100 mg 3 times daily.  Patient took hydralazine 100 mg, spironolactone, isosorbide dinitrate, and aspirin this morning at approximately 8 AM.  Impression:   ICD-10-CM   1. Essential hypertension  I10     No orders of the defined types were placed in this encounter.   No orders of the defined types were placed in this encounter.   Recommendations: Advised patient to take hydralazine 50 mg now and another 50 mg of hydralazine with isosorbide at her usual afternoon dosing time, which is approximately 2:00 PM.  Patient will then take hydralazine 100 mg this evening as directed at discharge from the hospital.  Patient has follow-up appointment scheduled with Dr. Virgina Jock in the office on Monday.  Advised patient to bring medications with her for review.  Telephone encounter total time: 11 minutes     Alethia Berthold, PA-C 09/22/2020, 12:18 PM Office: [phone]

## 2020-09-24 NOTE — Progress Notes (Signed)
Follow up visit  Subjective:   Sara Brown, female    DOB: 17-Dec-1940, 80 y.o.   MRN: [id number]   Chief Complaint  Patient presents with  . Hypertension  . Follow-up    3 month    80 y/o African-American female with hypertension, diet controlled diabetes, hyperlipidemia, GERD and remote history of TIA, mild nonobstructive coronary artery disease (Cath 04/01/2018).  Patient continues to have episodes of headaches, paresthesias.  She was admitted to [facility] in March 2022 with left-sided paresthesia, acute on chronic, as well as possible seizure.  She was started on Keppra and was recommended outpatient follow-up with neurologist Dr. Rexene Alberts.  Appointment is scheduled for first week of May 2022.  Patient remains distressed about her symptoms as well as blood pressure numbers.  I carefully reviewed all her home blood pressure readings.  Her average blood pressure is 129/75 MNG with low systolic blood pressure reading less than 100 mmHg, or greater than 150 mmHg.  She denies any chest pain or shortness of breath.  Current Outpatient Medications on File Prior to Visit  Medication Sig Dispense Refill  . ADVAIR DISKUS 100-50 MCG/DOSE AEPB Inhale 1 puff into the lungs 2 (two) times daily.    Marland Kitchen albuterol (PROVENTIL HFA;VENTOLIN HFA) 108 (90 Base) MCG/ACT inhaler Inhale 1 puff into the lungs every 4 (four) hours as needed for wheezing.    Marland Kitchen alendronate (FOSAMAX) 70 MG tablet Take 70 mg by mouth every Saturday.    Marland Kitchen aspirin EC 81 MG tablet Take 81 mg by mouth daily.    . Calcium Citrate-Vitamin D 315-250 MG-UNIT TABS Take 2 tablets by mouth daily.    . carboxymethylcellulose (REFRESH PLUS) 0.5 % SOLN Place 1-2 drops into both eyes 3 (three) times daily.    . Cholecalciferol (VITAMIN D3) 10 MCG (400 UNIT) CAPS Take 800 Units by mouth daily.    . Clobetasol Prop Emollient Base (CLOBETASOL PROPIONATE E) 0.05 % emollient cream Apply 1 application topically 2 (two) times daily as  needed. (Patient taking differently: Apply 1 application topically 2 (two) times daily as needed (rash under breast).) 60 g 6  . cloNIDine (CATAPRES) 0.1 MG tablet Take 0.1 mg by mouth See admin instructions. Take 1 tablet by mouth every night at bedtime. May take additional 1 tablet if blood pressure is high  3  . Coenzyme Q10 (COQ10) 200 MG CAPS Take 200 mg by mouth daily.    . cycloSPORINE (RESTASIS) 0.05 % ophthalmic emulsion Place 1 drop into both eyes 2 (two) times daily.     . fluticasone (FLONASE) 50 MCG/ACT nasal spray Place 1 spray into both nostrils at bedtime as needed for allergies or rhinitis.    . Garlic 9485 MG CAPS Take 1,000 mg by mouth daily.    . hydrALAZINE (APRESOLINE) 50 MG tablet Take 2 tablets (100 mg total) by mouth 3 (three) times daily. (Patient taking differently: Take 50 mg by mouth See admin instructions. Take 1 tablet by mouth 3 times daily. Take 2 tablets by mouth 3 times daily when blood pressure top number is above 140.) 360 tablet 3  . isosorbide dinitrate (ISORDIL) 20 MG tablet TAKE 1 TABLET BY MOUTH THREE TIMES A DAY (Patient taking differently: Take 20 mg by mouth 3 (three) times daily.) 270 tablet 1  . levETIRAcetam (KEPPRA) 500 MG tablet Take 1 tablet (500 mg total) by mouth every 12 (twelve) hours. 60 tablet 1  . Magnesium 250 MG TABS Take 250 mg by  mouth daily.    . Multiple Vitamins-Minerals (MULTIVITAMIN WITH MINERALS) tablet Take 1 tablet by mouth daily. Centrum for Women    . omeprazole (PRILOSEC OTC) 20 MG tablet Take 20 mg by mouth daily as needed (for reflux).     . Pitavastatin Calcium (LIVALO) 1 MG TABS Take 1 mg by mouth daily.    . polyethylene glycol (MIRALAX / GLYCOLAX) packet Take 17 g by mouth at bedtime.    . pyridOXINE (VITAMIN B-6) 100 MG tablet Take 100 mg by mouth daily.     Marland Kitchen spironolactone (ALDACTONE) 50 MG tablet Take 50 mg by mouth daily.     Marland Kitchen triamcinolone cream (KENALOG) 0.1 % Apply 1 application topically 2 (two) times daily as  needed (rash/ eczema).   2   No current facility-administered medications on file prior to visit.    Cardiovascular & other pertient studies:  EKG 07/12/2020: Sinus rhythm 65 bpm Low voltage in precordial leads Cannot exclude old anteroseptal infarct  Coronary angiogram 04/01/2018: LM: Normal LAD: Prox-mid 40% stenosis. iFR 0.99 (Physiologically nonsignificant) LCx: Dominant. Small branches with mild disease RCA: Nondominant. Normal   LVEDP mildly elevated.   Carotid artery duplex 02/23/2018: No hemodynamically significant arterial disease in the internal carotid artery bilaterally. No significant plaque burden. Antegrade right vertebral artery flow. Antegrade left vertebral artery flow.  Echocardiogram 10/29/2017: Left ventricle cavity is normal in size. Mild concentric hypertrophy of the left ventricle. Normal global wall motion. Normal diastolic filling pattern. Calculated EF 69%. No significant valvular abnormality.  Insignificant pericardial effusion.  Exercise myoview stress 10/20/2017: 1. The resting electrocardiogram demonstrated normal sinus rhythm, normal resting conduction and no resting arrhythmias.  The stress electrocardiogram was normal.  Patient exercised on Bruce protocol for 7:43 minutes and achieved 8.54 METS. Stress test terminated due to mid sternal chest pain and 90% % MPHR achieved (Target HR >85%). Normal BP. 2.  Myocardial perfusion imaging is normal. Overall left ventricular systolic function was normal without regional wall motion abnormalities. The left ventricular ejection fraction was 76%.  This is a low risk study.  Recent labs: 05/26/2020: Glucose 115, BUN/Cr 16/1.19. EGFR 46. Na/K 139/4.0 . Rest of the CMP normal H/H 12/36. MCV 88. Platelets 237  10/11/2019: Glucose 123, BUN/Cr 15/0.86. EGFR 60. Na/K 136/4.8. Rest of the CMP normal H/H 12.8/40. MCV 91.5. Platelets 252.  09/20/2019: A1C 6.3% Chol 185, TG 168, HDL 60, LDL 96.  12/31/2018: TSH  1.710   Review of Systems  Cardiovascular: Negative for chest pain, dyspnea on exertion, leg swelling, palpitations and syncope.  Neurological: Positive for headaches and paresthesias.  Psychiatric/Behavioral: The patient is nervous/anxious.         Vitals:   09/25/20 0949  BP: (!) 142/78  Pulse: 77  Resp: 16  Temp: 97.8 F (36.6 C)  SpO2: 97%     Body mass index is 21.47 kg/m. There were no vitals filed for this visit.   Objective:   Physical Exam Vitals and nursing note reviewed.  Constitutional:      Appearance: She is well-developed.  Neck:     Vascular: No JVD.  Cardiovascular:     Rate and Rhythm: Normal rate and regular rhythm.     Pulses: Intact distal pulses.     Heart sounds: Normal heart sounds. No murmur heard.   Pulmonary:     Effort: Pulmonary effort is normal.     Breath sounds: Normal breath sounds. No wheezing or rales.  Musculoskeletal:     Right lower leg:  No edema.     Left lower leg: No edema.         Assessment & Recommendations:   80 y/o African-American female with hypertension, diet controlled diabetes, hyperlipidemia, GERD and remote history of TIA, mild nonobstructive coronary artery disease (Cath 04/01/2018).  Hypertension: Generally well controlled.  I do not think her headaches are all attributable to her hypertension. Continue hydralazine 100 mg tid, Isordil 20 mg tid, spironolactone 50 mg daily, clonidine 0.1 mg bid Reported intermittent 6 rash on left flank attributed by patient to Isordil.  This may be a fixed drug reaction.  However, in absence of any severe allergic reaction such as angioedema, lip swelling, tongue swelling, shortness of breath, I will continue Isordil.  Patient had difficulty identifying blood pressure medications the patient tolerates and we are currently in a relatively stable situation with her blood pressure at this point. Recommend follow-up with neurology regarding headaches and  paresthesias.  Coronary artery disease involving native coronary artery of native heart without angina pectoris Continue risk factor modification with BP and lipid lowering therapy. EKG shows no ischemia.  Continue aspirin 81 mg daily, pitvastatin. She has not tolerated other statins in the past.   F/u in 3 months  Sameena Artus Esther Hardy, MD [facility] [place] Pager: [phone] Office: [phone]

## 2020-09-25 ENCOUNTER — Encounter: Payer: Self-pay | Admitting: Cardiology

## 2020-09-25 ENCOUNTER — Ambulatory Visit: Payer: Medicare Other | Admitting: Cardiology

## 2020-09-25 ENCOUNTER — Other Ambulatory Visit: Payer: Self-pay

## 2020-09-25 VITALS — BP 142/78 | HR 77 | Temp 97.8°F | Resp 16 | Ht 66.0 in | Wt 133.0 lb

## 2020-09-25 DIAGNOSIS — I2510 Atherosclerotic heart disease of native coronary artery without angina pectoris: Secondary | ICD-10-CM | POA: Diagnosis not present

## 2020-09-25 DIAGNOSIS — I10 Essential (primary) hypertension: Secondary | ICD-10-CM | POA: Diagnosis not present

## 2020-09-27 ENCOUNTER — Other Ambulatory Visit: Payer: Self-pay | Admitting: Cardiology

## 2020-09-29 ENCOUNTER — Other Ambulatory Visit: Payer: Self-pay

## 2020-09-29 ENCOUNTER — Ambulatory Visit: Payer: Medicare Other

## 2020-09-29 VITALS — BP 99/51 | HR 71

## 2020-09-29 DIAGNOSIS — M6281 Muscle weakness (generalized): Secondary | ICD-10-CM | POA: Diagnosis not present

## 2020-09-29 DIAGNOSIS — R2689 Other abnormalities of gait and mobility: Secondary | ICD-10-CM

## 2020-09-29 DIAGNOSIS — R2681 Unsteadiness on feet: Secondary | ICD-10-CM | POA: Diagnosis not present

## 2020-09-29 NOTE — Therapy (Signed)
[facility] [place] [address] [place], [name], [address] Phone: [phone]   Fax:  [phone]  Physical Therapy Treatment  Patient Details  Name: Sara Brown MRN: [id number] Date of Birth: 23-Feb-1941 Referring Provider (PT): Cristal Ford (hospitalist) but PCP is Seward Carol   Encounter Date: 09/29/2020   PT End of Session - 09/29/20 0934    Visit Number 5    Number of Visits 11    Date for PT Re-Evaluation 12/07/20   60 day poc, 90 day cert   Authorization Type Medicare so 10th visit progress note    PT Start Time 0931    PT Stop Time 1013    PT Time Calculation (min) 42 min    Equipment Utilized During Treatment Gait belt    Activity Tolerance Patient tolerated treatment well    Behavior During Therapy [facility] for tasks assessed/performed           Past Medical History:  Diagnosis Date  . Allergic rhinitis   . Arthritis    "fingers, knees" (02/09/2018)  . Asthma    "stopped taking RX after dr said I don't have this" (02/09/2018)  . Carotid stenosis    ICA(L)  . Complication of anesthesia    "felt the cut w/one of my c-sections" (02/09/2018)  . Constipation   . GERD (gastroesophageal reflux disease)   . Headache    "years ago; before menopause" (02/09/2018)  . Hyperlipidemia   . Hypertension   . Neuropathy    face (R)  . OA (osteoarthritis)   . Osteoporosis   . TIA (transient ischemic attack) ?2005  . Type II diabetes mellitus ([place])   . Vitamin D deficiency     Past Surgical History:  Procedure Laterality Date  . CATARACT EXTRACTION W/ INTRAOCULAR LENS  IMPLANT, BILATERAL Bilateral   . CESAREAN SECTION  1971; 1975  . INTRAVASCULAR PRESSURE WIRE/FFR STUDY N/A 04/01/2018   Procedure: INTRAVASCULAR PRESSURE WIRE/FFR STUDY;  Surgeon: Nigel Mormon, MD;  Location: [facility];  Service: Cardiovascular;  Laterality: N/A;  . LEFT HEART CATH AND CORONARY ANGIOGRAPHY N/A 04/01/2018    Procedure: LEFT HEART CATH AND CORONARY ANGIOGRAPHY;  Surgeon: Nigel Mormon, MD;  Location: [facility];  Service: Cardiovascular;  Laterality: N/A;  . TUBAL LIGATION  1975    Vitals:   09/29/20 0938 09/29/20 1006  BP: (!) 102/52 (!) 99/51  Pulse: 70 71     Subjective Assessment - 09/29/20 0935    Subjective Patient reports had appointment with Cardiologist earlier this week, no med changes. BP has been doing well. No falls or changes since complaint last visit.    Patient is accompained by: Family member   husband   Pertinent History 80 y.o. female with medical history significant of hypertension, hyperlipidemia, nonobstructive CAD, left carotid stenosis, TIA, diabetes mellitus type 2, asthma, vitamin D deficiency, and GERD who with complaints of a weird sensation on her left side.    Patient Stated Goals Pt would like to be able to walk better as she likes to garden.    Currently in Pain? No/denies    Pain Onset In the past 7 days             [facility] Adult PT Treatment/Exercise - 09/29/20 0001      Transfers   Transfers Sit to Stand;Stand to Sit    Sit to Stand 5: Supervision    Stand to Sit 5: Supervision    Number  of Reps 10 reps;1 set   completed with BLE on airex     Ambulation/Gait   Ambulation/Gait Yes    Ambulation/Gait Assistance 5: Supervision    Ambulation/Gait Assistance Details around in clinic with activities    Ambulation Distance (Feet) --   clinic distances   Assistive device None    Gait Pattern Step-through pattern    Ambulation Surface Level;Indoor      High Level Balance   High Level Balance Activities Head turns    High Level Balance Comments Completed ambulation x 200 ft with horiz/vertical head turns on command, no imbalance noted.Supervision throughout               Balance Exercises - 09/29/20 0001      Balance Exercises: Standing   Standing Eyes Opened Narrow base of support (BOS);Head turns;Foam/compliant  surface;Limitations;Wide (BOA)    Standing Eyes Opened Limitations completed on pillows to simulate how patient is completing at home, completed x 10 reps horiz/vertical head turns    Standing Eyes Closed Wide (BOA);Foam/compliant surface;3 reps;30 secs;Limitations    Standing Eyes Closed Limitations completed on pillows to simulate how patient is completing at home, completed vision removed 3 x 30 seconds.    Stepping Strategy Anterior;Posterior;Foam/compliant surface;10 reps;Limitations    Stepping Strategy Limitations standing across blue balance beam without UE support completed x 10 reps of anterior/posterior stepping strategy, increased challenge with posterior noted.    Rockerboard Anterior/posterior;EO;Head turns;EC;30 seconds;Intermittent UE support;Limitations    Rockerboard Limitations standing across rockerboard positioned A/P completed: completed horizontal/vertical head turns x 10 reps, then with eyes closed 3 x 30 seconds. increased challenge noted with vertical > horizontal head turns. CGA intermittently.    Tandem Gait Forward;Retro;Foam/compliant surface;4 reps;Limitations    Tandem Gait Limitations on blue balance in // bars, light UE support required with retro tandem.    Sidestepping Foam/compliant support;3 reps;Limitations    Sidestepping Limitations completed x 3 laps down and back, without UE support on blue balance beam.               PT Short Term Goals - 09/08/20 1858      PT SHORT TERM GOAL #1   Title Pt will be independent with initial HEP for balance.    Time 4    Period Weeks    Status New    Target Date 10/08/20      PT SHORT TERM GOAL #2   Title Pt will decrease 5 x sit to stand from 12.34 sec to <11 sec for improved balance and functional strength.    Baseline 09/08/20 12.34 sec from mat without hands    Time 4    Period Weeks    Status New    Target Date 10/08/20             PT Long Term Goals - 09/08/20 1901      PT LONG TERM GOAL #1    Title Pt will ambulate >1000' on varied surfaces independently for improved community mobility.    Time 8    Period Weeks    Status New    Target Date 11/07/20      PT LONG TERM GOAL #2   Title Pt will increase FGA from 20 to >24/30 for improved balance and gait safety.    Baseline 09/08/20 20/30    Time 8    Period Weeks    Status New    Target Date 11/07/20      PT LONG TERM GOAL #  3   Title Pt will be able to perform gardening simulation activities without LOB for improved function and functional strength.    Time 8    Period Weeks    Status New    Target Date 11/07/20                 Plan - 09/29/20 1014    Clinical Impression Statement Reviewed corner balance, as patient reports difficulty. Educated to reduce pillows to one. Continued balance activities working on improved stepping strategy and balance with narrow BOS. Patient tolerating well. Will continue per POC and progress toward all LTGs.    Personal Factors and Comorbidities Comorbidity 3+    Comorbidities hypertension, hyperlipidemia, nonobstructive CAD, left carotid stenosis, TIA, diabetes mellitus type 2, asthma, vitamin D deficiency, and GERD    Examination-Activity Limitations Locomotion Level;Stairs    Examination-Participation Restrictions Community Activity;Yard Work    [employer] Evolving/Moderate complexity    Rehab Potential Good    PT Frequency 2x / week   followed by 1x/week for 6 weeks   PT Duration 2 weeks    PT Treatment/Interventions ADLs/Self Care Home Management;DME Instruction;Gait training;Stair training;Functional mobility training;Therapeutic activities;Balance training;Neuromuscular re-education    PT Next Visit Plan Check STGs. Continue balance training with high level activities. Watch how knees do as had some random buckling epecially on left noted at times.    Consulted and Agree with Plan of Care Patient;Family member/caregiver    Family Member Consulted husband            Patient will benefit from skilled therapeutic intervention in order to improve the following deficits and impairments:  Abnormal gait,Decreased balance,Decreased mobility,Decreased strength  Visit Diagnosis: Other abnormalities of gait and mobility  Unsteadiness on feet  Muscle weakness (generalized)     Problem List Patient Active Problem List   Diagnosis Date Noted  . Paresthesias 09/05/2020  . Age-related osteoporosis without current pathological fracture 08/18/2020  . Pure hypercholesterolemia 08/18/2020  . Chronic constipation 08/18/2020  . Right ear impacted cerumen 10/26/2019  . Coronary artery disease involving native coronary artery of native heart without angina pectoris 09/17/2018  . Diabetes mellitus type 2, controlled ([place]) 03/31/2018  . Essential hypertension 03/31/2018  . Dyslipidemia 03/31/2018  . Chest pain 02/10/2018  . Chest pain due to coronary artery disease ([place]) 02/09/2018  . Angina at rest [facility]) 02/09/2018  . Bradycardia 02/09/2018  . Skin laxity 06/12/2017  . Hirsutism 06/12/2017  . Hyperpigmentation 02/13/2017  . Neck mass 01/17/2017  . 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 Ear pain, left 02/21/2016  . GERD (gastroesophageal reflux disease)   . Shortness of breath 07/30/2007  . Cough 07/30/2007    Jones Bales, PT, DPT 09/29/2020, 10:37 AM  [place] [facility] [address] [place], [name], [address] Phone: [phone]   Fax:  [phone]  Name: Shilee Biggs MRN: [id number] Date of Birth: [birth year]

## 2020-10-04 DIAGNOSIS — F419 Anxiety disorder, unspecified: Secondary | ICD-10-CM | POA: Diagnosis not present

## 2020-10-06 ENCOUNTER — Other Ambulatory Visit: Payer: Self-pay

## 2020-10-06 ENCOUNTER — Ambulatory Visit: Payer: Medicare Other

## 2020-10-06 VITALS — BP 140/72 | HR 73

## 2020-10-06 DIAGNOSIS — R2689 Other abnormalities of gait and mobility: Secondary | ICD-10-CM | POA: Diagnosis not present

## 2020-10-06 DIAGNOSIS — M6281 Muscle weakness (generalized): Secondary | ICD-10-CM | POA: Diagnosis not present

## 2020-10-06 DIAGNOSIS — R2681 Unsteadiness on feet: Secondary | ICD-10-CM | POA: Diagnosis not present

## 2020-10-06 NOTE — Therapy (Signed)
[facility] [address] [place], [name], [address] Phone: [phone]   Fax:  [phone]  Physical Therapy Treatment  Patient Details  Name: Sara Brown MRN: [id number] Date of Birth: [birth year] Referring Provider (PT): Cristal Ford (hospitalist) but PCP is Seward Carol   Encounter Date: 10/06/2020   PT End of Session - 10/06/20 0852    Visit Number 6    Number of Visits 11    Date for PT Re-Evaluation 12/07/20   60 day poc, 90 day cert   Authorization Type Medicare so 10th visit progress note    PT Start Time 0850    PT Stop Time 0930    PT Time Calculation (min) 40 min    Equipment Utilized During Treatment Gait belt    Activity Tolerance Patient tolerated treatment well    Behavior During Therapy [facility] for tasks assessed/performed           Past Medical History:  Diagnosis Date  . Allergic rhinitis   . Arthritis    "fingers, knees" (02/09/2018)  . Asthma    "stopped taking RX after dr said I don't have this" (02/09/2018)  . Carotid stenosis    ICA(L)  . Complication of anesthesia    "felt the cut w/one of my c-sections" (02/09/2018)  . Constipation   . GERD (gastroesophageal reflux disease)   . Headache    "years ago; before menopause" (02/09/2018)  . Hyperlipidemia   . Hypertension   . Neuropathy    face (R)  . OA (osteoarthritis)   . Osteoporosis   . TIA (transient ischemic attack) ?2005  . Type II diabetes mellitus ([place])   . Vitamin D deficiency     Past Surgical History:  Procedure Laterality Date  . CATARACT EXTRACTION W/ INTRAOCULAR LENS  IMPLANT, BILATERAL Bilateral   . CESAREAN SECTION  1971; 1975  . INTRAVASCULAR PRESSURE WIRE/FFR STUDY N/A 04/01/2018   Procedure: INTRAVASCULAR PRESSURE WIRE/FFR STUDY;  Surgeon: Nigel Mormon, MD;  Location: [facility];  Service: Cardiovascular;  Laterality: N/A;  . LEFT HEART CATH AND CORONARY ANGIOGRAPHY N/A 04/01/2018    Procedure: LEFT HEART CATH AND CORONARY ANGIOGRAPHY;  Surgeon: Nigel Mormon, MD;  Location: [facility];  Service: Cardiovascular;  Laterality: N/A;  . TUBAL LIGATION  1975    Vitals:   10/06/20 0858 10/06/20 0913  BP: 115/69 140/72  Pulse: 75 73     Subjective Assessment - 10/06/20 0853    Subjective Patient reports yesterday she felt bad, knew her BP was high. tried to call cardiologist but was unable to get in touch.    Patient is accompained by: Family member   husband   Pertinent History 80 y.o. female with medical history significant of hypertension, hyperlipidemia, nonobstructive CAD, left carotid stenosis, TIA, diabetes mellitus type 2, asthma, vitamin D deficiency, and GERD who with complaints of a weird sensation on her left side.    Patient Stated Goals Pt would like to be able to walk better as she likes to garden.    Currently in Pain? No/denies    Pain Onset In the past 7 days                   [facility] Adult PT Treatment/Exercise - 10/06/20 0001      Transfers   Transfers Sit to Stand;Stand to Sit    Sit to Stand 5: Supervision    Five time sit to stand comments  10.59  secs without hands on mat    Stand to Sit 5: Supervision      Ambulation/Gait   Ambulation/Gait Yes    Ambulation/Gait Assistance 5: Supervision    Ambulation/Gait Assistance Details around clinic with activiites    Assistive device None    Gait Pattern Step-through pattern    Ambulation Surface Level;Indoor           Completed all of the following exercises as review/progression of current HEP. Bolded additions are new/progression of current HEP. Intermittent seated rest breaks required due to "wooziness".  PT educated patient on how to set timer on phone for exercises.    Access Code: [email or web address] URL: [email or web address] Date: 10/06/2020 Prepared by: Baldomero Lamy  Exercises Walking March - 2 x daily - 7 x weekly - 1 sets - 3-4 reps Tandem Walking with  Counter Support - 2 x daily - 7 x weekly - 1 sets - 3-4 reps Backward Tandem Walking with Counter Support - 2 x daily - 7 x weekly - 1 sets - 3-4 reps Romberg Stance on Foam Pad with Head Rotation - 1 x daily - 7 x weekly - 2 sets - 10 reps Romberg Stance with Head Nods on Foam Pad - 1 x daily - 7 x weekly - 2 sets - 10 reps Romberg Stance Eyes Closed on Foam Pad - 1 x daily - 7 x weekly - 1 sets - 3 reps - 30 hold       PT Education - 10/06/20 0852    Education Details Progress toward STGs; Updated HEP    Person(s) Educated Patient    Methods Explanation    Comprehension Verbalized understanding            PT Short Term Goals - 10/06/20 0859      PT SHORT TERM GOAL #1   Title Pt will be independent with initial HEP for balance.    Baseline reports independence, only completing 2x/week currently    Time 4    Period Weeks    Status Achieved    Target Date 10/08/20      PT SHORT TERM GOAL #2   Title Pt will decrease 5 x sit to stand from 12.34 sec to <11 sec for improved balance and functional strength.    Baseline 09/08/20 12.34 sec from mat without hands; 10.59 secs without UE support    Time 4    Period Weeks    Status Achieved    Target Date 10/08/20             PT Long Term Goals - 09/08/20 1901      PT LONG TERM GOAL #1   Title Pt will ambulate >1000' on varied surfaces independently for improved community mobility.    Time 8    Period Weeks    Status New    Target Date 11/07/20      PT LONG TERM GOAL #2   Title Pt will increase FGA from 20 to >24/30 for improved balance and gait safety.    Baseline 09/08/20 20/30    Time 8    Period Weeks    Status New    Target Date 11/07/20      PT LONG TERM GOAL #3   Title Pt will be able to perform gardening simulation activities without LOB for improved function and functional strength.    Time 8    Period Weeks    Status New    Target Date 11/07/20  Plan - 10/06/20 0904    Clinical  Impression Statement Today's skilled PT session included assessment of patient's progress toward STGs. Patient able to meet all STG today. Patient improved 5x sit <> stand to seconds  10.59 secs w/o UE support. Rest of session spent reviewing/progressing HEP. Will continue to progress toward alL LTGs.    Personal Factors and Comorbidities Comorbidity 3+    Comorbidities hypertension, hyperlipidemia, nonobstructive CAD, left carotid stenosis, TIA, diabetes mellitus type 2, asthma, vitamin D deficiency, and GERD    Examination-Activity Limitations Locomotion Level;Stairs    Examination-Participation Restrictions Community Activity;Yard Work    [employer] officer Evolving/Moderate complexity    Rehab Potential Good    PT Frequency 2x / week   followed by 1x/week for 6 weeks   PT Duration 2 weeks    PT Treatment/Interventions ADLs/Self Care Home Management;DME Instruction;Gait training;Stair training;Functional mobility training;Therapeutic activities;Balance training;Neuromuscular re-education    PT Next Visit Plan How was updated HEP? Continue balance training with high level activities. Watch how knees do as had some random buckling epecially on left noted at times.    Consulted and Agree with Plan of Care Patient;Family member/caregiver    Family Member Consulted husband           Patient will benefit from skilled therapeutic intervention in order to improve the following deficits and impairments:  Abnormal gait,Decreased balance,Decreased mobility,Decreased strength  Visit Diagnosis: Other abnormalities of gait and mobility  Unsteadiness on feet  Muscle weakness (generalized)     Problem List Patient Active Problem List   Diagnosis Date Noted  . Paresthesias 09/05/2020  . Age-related osteoporosis without current pathological fracture 08/18/2020  . Pure hypercholesterolemia 08/18/2020  . Chronic constipation 08/18/2020  . Right ear impacted cerumen 10/26/2019  .  Coronary artery disease involving native coronary artery of native heart without angina pectoris 09/17/2018  . Diabetes mellitus type 2, controlled ([place]) 03/31/2018  . Essential hypertension 03/31/2018  . Dyslipidemia 03/31/2018  . Chest pain 02/10/2018  . Chest pain due to coronary artery disease ([place]) 02/09/2018  . Angina at rest [facility]) 02/09/2018  . Bradycardia 02/09/2018  . Skin laxity 06/12/2017  . Hirsutism 06/12/2017  . Hyperpigmentation 02/13/2017  . Neck mass 01/17/2017  . 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 Ear pain, left 02/21/2016  . GERD (gastroesophageal reflux disease)   . Shortness of breath 07/30/2007  . Cough 07/30/2007    Jones Bales, PT, DPT 10/06/2020, 9:31 AM  [facility] [address] [place], [name], [address] Phone: [phone]   Fax:  [phone]  Name: Lillia Lengel MRN: [id number] Date of Birth: 1940/09/03

## 2020-10-06 NOTE — Patient Instructions (Signed)
Access Code: [email or web address] URL: [email or web address] Date: 10/06/2020 Prepared by: Baldomero Lamy  Exercises Walking March - 2 x daily - 7 x weekly - 1 sets - 3-4 reps Tandem Walking with Counter Support - 2 x daily - 7 x weekly - 1 sets - 3-4 reps Backward Tandem Walking with Counter Support - 2 x daily - 7 x weekly - 1 sets - 3-4 reps Romberg Stance on Foam Pad with Head Rotation - 1 x daily - 7 x weekly - 2 sets - 10 reps Romberg Stance with Head Nods on Foam Pad - 1 x daily - 7 x weekly - 2 sets - 10 reps Romberg Stance Eyes Closed on Foam Pad - 1 x daily - 7 x weekly - 1 sets - 3 reps - 30 hold

## 2020-10-09 DIAGNOSIS — I10 Essential (primary) hypertension: Secondary | ICD-10-CM | POA: Diagnosis not present

## 2020-10-10 ENCOUNTER — Institutional Professional Consult (permissible substitution): Payer: Medicare Other | Admitting: Neurology

## 2020-10-11 ENCOUNTER — Other Ambulatory Visit: Payer: Self-pay

## 2020-10-11 ENCOUNTER — Encounter: Payer: Self-pay | Admitting: Physical Therapy

## 2020-10-11 ENCOUNTER — Ambulatory Visit: Payer: Medicare Other | Attending: Internal Medicine | Admitting: Physical Therapy

## 2020-10-11 VITALS — BP 130/68 | HR 69

## 2020-10-11 DIAGNOSIS — R2689 Other abnormalities of gait and mobility: Secondary | ICD-10-CM | POA: Diagnosis not present

## 2020-10-11 DIAGNOSIS — R2681 Unsteadiness on feet: Secondary | ICD-10-CM | POA: Insufficient documentation

## 2020-10-11 DIAGNOSIS — M6281 Muscle weakness (generalized): Secondary | ICD-10-CM | POA: Insufficient documentation

## 2020-10-12 ENCOUNTER — Encounter: Payer: Self-pay | Admitting: Neurology

## 2020-10-12 ENCOUNTER — Ambulatory Visit (INDEPENDENT_AMBULATORY_CARE_PROVIDER_SITE_OTHER): Payer: Medicare Other | Admitting: Neurology

## 2020-10-12 ENCOUNTER — Telehealth: Payer: Self-pay

## 2020-10-12 VITALS — BP 132/62 | HR 71 | Ht 66.5 in | Wt 133.3 lb

## 2020-10-12 DIAGNOSIS — R519 Headache, unspecified: Secondary | ICD-10-CM | POA: Diagnosis not present

## 2020-10-12 DIAGNOSIS — R9401 Abnormal electroencephalogram [EEG]: Secondary | ICD-10-CM | POA: Diagnosis not present

## 2020-10-12 DIAGNOSIS — G459 Transient cerebral ischemic attack, unspecified: Secondary | ICD-10-CM | POA: Diagnosis not present

## 2020-10-12 DIAGNOSIS — I2510 Atherosclerotic heart disease of native coronary artery without angina pectoris: Secondary | ICD-10-CM

## 2020-10-12 MED ORDER — LEVETIRACETAM 500 MG PO TABS: 500.0000 mg | ORAL_TABLET | Freq: Two times a day (BID) | ORAL | 5 refills | Status: DC

## 2020-10-12 NOTE — Telephone Encounter (Signed)
Ok. Will check with [facility].

## 2020-10-12 NOTE — Patient Instructions (Signed)
You had extensive work-up in the hospital recently in March.  Your headaches may be related to medication side effect or blood pressure fluctuations.  Your exam does not suggest any new findings.  I would recommend that you continue with the Keppra twice daily and I renewed your prescription.  We can consider repeating your brain electrical test called EEG at the next appointment.  Please follow-up in 6 months to see one of our nurse practitioners. As advised in the hospital and as discussed during today's visit, please do not drive for at least 6 months so long as you stay symptom-free of your spells of numbness.
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
## 2020-10-12 NOTE — Progress Notes (Signed)
Subjective:    Patient ID: Ramisa Duman is a 80 y.o. female.  HPI     Star Age, MD, PhD [facility] [address] [place], [place] [address]  I saw Ms. Linna Darner as a referral from the hospital after recent hospitalization for suspected TIA.  The patient is accompanied by her husband today.  Ms. Reek is an 74 -year-old right-handed woman with an underlying medical history of allergic rhinitis, carotid artery stenosis, reflux disease, hypertension hyperlipidemia, vitamin D deficiency, paresthesias, diabetes, and osteoarthritis, who reports having recurrent hand pains.  These are not new.  She has blood pressure fluctuations, sometimes her headache hurts when the blood pressure is low and sometimes it hurts when it is up.  She is tolerating the Keppra.  She is in physical therapy.  She takes the Keppra twice daily.  She has had no new symptoms since her hospital stay.  She is currently not driving and was instructed not to drive for 6 months when she was in the hospital.  She tries to hydrate well with water.  She has not fallen recently.   She presented to the emergency room on 09/04/2020 with left-sided paresthesias.  These were noted primarily in her face as well as leg.  Work-up included head imaging tests: She had a head CT without contrast on 09/05/2020 and I reviewed the results: IMPRESSION: No acute finding.  Unremarkable study for age.  She had a brain MRI without contrast as well as MRA neck and MRA head on 09/05/2020 and I reviewed the results:  IMPRESSION: 1. No evidence of acute intracranial abnormality.  No acute infarct. 2. No emergent large vessel occlusion or evidence of proximal hemodynamically significant stenosis in the head or neck. 3. In comparison to 1031, similar dolichoectatic left vertebral artery.  She had an EEG on 09/05/2020 and I reviewed the results:  EEG Interpretation: This routine EEG recorded in  the awake and drowsy states is abnormal.  The focal slowing present in the left temporal region suggest focal cerebral dysfunction in this region.  There were no epileptiform discharges present.   She was started on Keppra for the possibility of seizure.    Previously:   08/29/19: 80 year old right-handed woman with an underlying medical history of hypertension, hyperlipidemia, osteoporosis, arthritis, reflux disease, allergic rhinitis, vitamin D deficiency, carotid artery stenosis on the left, and chronic constipation, who reports recurrent headaches in the past 2 months approximately.  She has fairly short-lived headaches in the top of the head and sometimes in the back of the head or parietal areas.  No temporal tenderness, no jaw pain, no temporal headaches.  She wonders if these could be sinus related, she has a history of postnasal drip and chronic sinus issues.  She takes Flonase every day.  She feels that the headache may also be related to blood pressure elevations.  She has no history of migraine headaches and headaches are not associated with nausea or vomiting or light or sound sensitivity.  She has a eye examination pending for this month.  I reviewed your office note from 08/02/2019.   I saw her in January of last year for a longer standing history of facial paresthesias.  She had a brain MRI without contrast on 06/18/2018 and I reviewed the results: IMPRESSION: This MRI of the brain without contrast shows the following: 1.    Mild chronic microvascular ischemic changes, essentially unchanged compared to the 07/28/2014 MRI. 2.  There are no acute findings.     Of note, she missed an appointment on 04/20/2018. The patient is a 80 year old right-handed woman with an underlying medical history of hypertension, hyperlipidemia, reflux disease, allergic rhinitis, osteoporosis, osteoarthritis, asthma, vitamin D deficiency, carotid stenosis on the left, and chronic constipation, who presents for  evaluation of  facial paresthesias. She was seen in the emergency room on 02/09/2018 and reported facial paresthesias and discomfort. She had a head CT without contrast on 02/09/2018 and I reviewed the results: IMPRESSION: No acute abnormality.   Chronic microvascular ischemic change.   Of note, she had interim emergency room visits and hospitalization for chest pain.   She has had elevated BP values, she sees cardiology for this. She went to the ER in [place] on 01/07/18 for HTN, she had a CTH wo contrast at the time, which was neg.  She reports intermittent tingling sensation affecting the left arm and sometimes the left side of her face, sometimes affecting her tongue.    She reports intermittent pain in the left arm, of note, when she was seen in the emergency room she had right-sided symptoms.    10/17/2014: She reports no new symptoms. She feels that sometimes she has a drawing sensation at the left side of her mouth. She has no one-sided numbness or tingling or weakness. She has improved symptoms on the right side. She had recent blood work with her primary care physician last month. We will request test results from his PCP.    I first met her on 07/15/2014 at the request of her primary care physician, at which time she reported a several year history of intermittent abnormal sensations in her right face, also affecting her right arm with pulling sensation in the right arm and shoulders as well as tingling noted. I suggested further evaluation with an EEG and MRI brain. We called her with all her test results and also she was seen in the interim by her nurse practitioner, Ms. Clabe Seal on 08/30/2014 at which time we discussed all her test results as well and I also saw her at the time. She felt she may have a facial droop on the left. On examination, she did not have any obvious facial droop on either side. Her exam was nonfocal.    She had an EEG on 07/26/2014 which was reported as normal in the  awake state. 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 No enhancing lesions are noted. No significant change compared with CT head dated 05/27/2014. In addition, I personally reviewed the images through the PACS system and agree with the findings.   07/15/2014: (She) has a several year history of intermittent abnormal sensations in her right face and R arm, with pulling sensation in the right arm and shoulder with tingling noted. She was told she may have a right facial droop when she saw physical therapy.  She is currently in outpatient physical therapy. She has had intermittent problems with abnormal sensation in her right face and arm for years. She may go days without symptoms. She wonders if this is a stress-related reaction. She was rushing to get to this appointment and while driving she had a sensation of tingling that started in her right mid face and went to her right shoulder. She continues to have very mild symptoms currently. Very occasionally will she have symptoms on the  left side. She has no left or right lower body symptoms. Strength-wise sometimes she feels a little weak during the episode which can last minutes to hours. It is usually not just seconds. She does not lose consciousness or awareness. She has never noted any abnormal involuntary twitching. She has not noted any muscle wasting. Symptoms started years ago. She had a recent head CT and remote MRI brain. She never had an EEG. There is no overt triggers or alleviating factors. She does not have any associated headaches with this but certainly does report occasional left parietal headaches which are not debilitating.   She had a CT head without contrast on 05/27/2014: 1. No acute intracranial findings. 2. Mild microvascular disease. In addition, personally reviewed the images through the  PACS system.   She had 2D echo on 10/18/13: this was unremarkable with EF 60-65%.    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 No other significant ischemic changes are noted. The enhancement pattern is normal and there is no mass lesion. No intracranial hemorrhage is identified.   In addition, personally reviewed the images through the PACS system. She had a MRA head without contrast and MRA neck with contrast on 03/09/2004 because of dizziness reported: MR angiography of the intracranial circulation shows no significant proximal stenosis or intracranial atherosclerotic change.   Advanced dolichoectasia of the craniocervical vessels without significant proximal stenosis. She had a BMP on 06/15/2014: This was unremarkable.  Her Past Medical History Is Significant For: Past Medical History:  Diagnosis Date  . Allergic rhinitis   . Arthritis    "fingers, knees" (02/09/2018)  . Asthma    "stopped taking RX after dr said I don't have this" (02/09/2018)  . Carotid stenosis    ICA(L)  . Complication of anesthesia    "felt the cut w/one of my c-sections" (02/09/2018)  . Constipation   . GERD (gastroesophageal reflux disease)   . Headache    "years ago; before menopause" (02/09/2018)  . Hyperlipidemia   . Hypertension   . Neuropathy    face (R)  . OA (osteoarthritis)   . Osteoporosis   . TIA (transient ischemic attack) ?2005  . Type II diabetes mellitus ([place])   . Vitamin D deficiency     Her Past Surgical History Is Significant For: Past Surgical History:  Procedure Laterality Date  . CATARACT EXTRACTION W/ INTRAOCULAR LENS  IMPLANT, BILATERAL Bilateral   . CESAREAN SECTION  1971; 1975  . INTRAVASCULAR PRESSURE WIRE/FFR STUDY N/A 04/01/2018   Procedure: INTRAVASCULAR PRESSURE WIRE/FFR STUDY;  Surgeon: Nigel Mormon, MD;  Location: [facility];  Service:  Cardiovascular;  Laterality: N/A;  . LEFT HEART CATH AND CORONARY ANGIOGRAPHY N/A 04/01/2018   Procedure: LEFT HEART CATH AND CORONARY ANGIOGRAPHY;  Surgeon: Nigel Mormon, MD;  Location: [facility];  Service: Cardiovascular;  Laterality: N/A;  . TUBAL LIGATION  1975    Her Family History Is Significant For: Family History  Problem Relation Age of Onset  . Diabetes Mellitus II Mother   . Osteoporosis Mother   . Hypertension Mother   . Diabetes Mellitus II Father   . CVA Sister        4  . CAD Sister        4  . Hypertension Sister        4  . Osteoarthritis Sister  4  . Alcoholism Brother   . Breast cancer Cousin 75    Her Social History Is Significant For: Social History   Socioeconomic History  . Marital status: Married    Spouse name: Bland Span  . Number of children: 2  . Years of education: 16  . Highest education level: Bachelor's degree (e.g., BA, AB, BS)  Occupational History    Comment: employed at home  Tobacco Use  . Smoking status: Never Smoker  . Smokeless tobacco: Never Used  Vaping Use  . Vaping Use: Never used  Substance and Sexual Activity  . Alcohol use: Never  . Drug use: Never  . Sexual activity: Not Currently  Other Topics Concern  . Not on file  Social History Narrative   Consumes no caffeine   Social Determinants of Health   Financial Resource Strain: Not on file  Food Insecurity: Not on file  Transportation Needs: Not on file  Physical Activity: Not on file  Stress: Not on file  Social Connections: Not on file    Her Allergies Are:  Allergies  Allergen Reactions  . Naproxen Other (See Comments)    Patient preference  Other reaction(s): upset stomach  . Prednisone Other (See Comments)    Patient Preference  Other reaction(s): upset stomach  . Amlodipine Hives  . Losartan Rash  :   Her Current Medications Are:  Outpatient Encounter Medications as of 10/12/2020  Medication Sig  . acetaminophen (TYLENOL) 650  MG CR tablet Take 650 mg by mouth every 8 (eight) hours as needed for pain.  Marland Kitchen ADVAIR DISKUS 100-50 MCG/DOSE AEPB Inhale 1 puff into the lungs 2 (two) times daily.  Marland Kitchen albuterol (PROVENTIL HFA;VENTOLIN HFA) 108 (90 Base) MCG/ACT inhaler Inhale 1 puff into the lungs every 4 (four) hours as needed for wheezing.  Marland Kitchen alendronate (FOSAMAX) 70 MG tablet Take 70 mg by mouth every Saturday.  Marland Kitchen aspirin EC 81 MG tablet Take 81 mg by mouth daily.  . Calcium Citrate-Vitamin D 315-250 MG-UNIT TABS Take 2 tablets by mouth daily.  . carboxymethylcellulose (REFRESH PLUS) 0.5 % SOLN Place 1-2 drops into both eyes 3 (three) times daily.  . Cholecalciferol (VITAMIN D3) 10 MCG (400 UNIT) CAPS Take 800 Units by mouth daily.  . Clobetasol Prop Emollient Base (CLOBETASOL PROPIONATE E) 0.05 % emollient cream Apply 1 application topically 2 (two) times daily as needed. (Patient taking differently: Apply 1 application topically 2 (two) times daily as needed (rash under breast).)  . cloNIDine (CATAPRES) 0.1 MG tablet Take 0.1 mg by mouth See admin instructions. Take 1 tablet by mouth every night at bedtime. May take additional 1 tablet if blood pressure is high  . Coenzyme Q10 (COQ10) 200 MG CAPS Take 200 mg by mouth daily.  . cycloSPORINE (RESTASIS) 0.05 % ophthalmic emulsion Place 1 drop into both eyes 2 (two) times daily.   . fluticasone (FLONASE) 50 MCG/ACT nasal spray Place 1 spray into both nostrils at bedtime as needed for allergies or rhinitis.  . Garlic 1610 MG CAPS Take 1,000 mg by mouth daily.  . isosorbide dinitrate (ISORDIL) 20 MG tablet TAKE 1 TABLET BY MOUTH THREE TIMES A DAY  . levETIRAcetam (KEPPRA) 500 MG tablet Take 1 tablet (500 mg total) by mouth every 12 (twelve) hours.  . Magnesium 250 MG TABS Take 250 mg by mouth daily.  . Multiple Vitamins-Minerals (MULTIVITAMIN WITH MINERALS) tablet Take 1 tablet by mouth daily. Centrum for Women  . omeprazole (PRILOSEC OTC) 20 MG tablet Take 20  mg by mouth daily as  needed (for reflux).   . Pitavastatin Calcium (LIVALO) 1 MG TABS Take 1 mg by mouth daily.  . polyethylene glycol (MIRALAX / GLYCOLAX) packet Take 17 g by mouth at bedtime.  . pyridOXINE (VITAMIN B-6) 100 MG tablet Take 100 mg by mouth daily.   Marland Kitchen spironolactone (ALDACTONE) 50 MG tablet Take 50 mg by mouth daily.   Marland Kitchen triamcinolone cream (KENALOG) 0.1 % Apply 1 application topically 2 (two) times daily as needed (rash/ eczema).   . hydrALAZINE (APRESOLINE) 50 MG tablet Take 2 tablets (100 mg total) by mouth 3 (three) times daily. (Patient taking differently: Take 50 mg by mouth See admin instructions. Take 1 tablet by mouth 3 times daily. Take 2 tablets by mouth 3 times daily when blood pressure top number is above 140.)   No facility-administered encounter medications on file as of 10/12/2020.  :   Review of Systems:  Out of a complete 14 point review of systems, all are reviewed and negative with the exception of these symptoms as listed below: Review of Systems  Neurological:       Here to f/u from the ED on 09/04/20.     Objective:  Neurological Exam  Physical Exam Physical Examination:   Vitals:   10/12/20 1229  BP: 132/62  Pulse: 71  SpO2: 98%    General Examination: The patient is a very pleasant 80 y.o. female in no acute distress. She appears well-developed and well-nourished and well groomed.   HEENT:Normocephalic, atraumatic, pupils are equal, round and reactive to light, extraocular tracking is good, face is symmetric, no facial asymmetry noted, no dysarthria, no abnormal tongue movements.  Airway examination reveals moderate mouth dryness.  Tongue protrudes centrally and palate elevates symmetrically.  Chest:Clear to auscultation without wheezing, rhonchi or crackles noted.  Heart:S1+S2+0, regular and normal without murmurs, rubs or gallops noted.   Abdomen:Soft, non-tender and non-distended.  Extremities:There isnopitting edema in the distal lower  extremities bilaterally.  Skin: Warm and dry without trophic changes noted.  Musculoskeletal: exam reveals no obvious joint deformities, tenderness or joint swelling.    Neurologically:  Mental status: The patient is awake, alert and oriented in all 4 spheres.Herimmediate and remote memory, attention, language skills and fund of knowledge are appropriate. There is no evidence of aphasia, agnosia, apraxia or anomia. Speech is clear with normal prosody and enunciation. Thought process is linear. Mood is normaland affect is normal.  Cranial nerves II - XII are as described above under HEENT exam. Motor exam: Normal bulk, strength and tone is noted. There is no drift, tremor or rebound. Reflexes are1+ throughout. Fine motor skills and coordination:grosslyintact. Cerebellar testing: No dysmetria or intention tremor.  Sensory exam: intact to light touch.  Gait, station and balance:Shestands easily. No veering to one side is noted. No leaning to one side is noted. Posture is age-appropriate and stance is narrow based. Gait showsnormalstride length and normalpace. No problems turning are noted.   Assessmentand Plan:   [place] an 80 year old right-handed woman with an underlying medical history of allergic rhinitis, carotid artery stenosis, reflux disease, hypertension hyperlipidemia, vitamin D deficiency, paresthesias, diabetes, and osteoarthritis, who presents for follow-up consultation after recent hospitalization for TIA versus migraine headache, versus suspected seizure activity.  She had work-up in the hospital including multiple imaging tests.  No obvious abnormality was found, EEG had mild nonspecific changes, no obvious epileptiform discharges but she was empirically started on Keppra 500 mg twice daily generic.  She seems to tolerate this well.  She was advised not to drive for 6 months when she was hospitalized and I reiterated this recommendation to her.   I advised her that it is difficult to give her a definitive diagnosis as she has had intermittent paresthesias for quite some time.  She presented with left-sided paresthesias.  She is advised to talk to her pharmacist about possible interaction and side effects from medication regarding recurrent headaches.  She is advised to continue with Keppra and I renewed her prescription.  We will consider repeat EEG in about 6 months, she is advised to follow-up with one of our nurse practitioners.  I answered all the questions today and she and her husband were in agreement.

## 2020-10-17 ENCOUNTER — Ambulatory Visit: Payer: Medicare Other

## 2020-10-17 ENCOUNTER — Other Ambulatory Visit: Payer: Self-pay

## 2020-10-17 VITALS — BP 130/62

## 2020-10-17 DIAGNOSIS — R2689 Other abnormalities of gait and mobility: Secondary | ICD-10-CM | POA: Diagnosis not present

## 2020-10-17 DIAGNOSIS — R2681 Unsteadiness on feet: Secondary | ICD-10-CM | POA: Diagnosis not present

## 2020-10-17 DIAGNOSIS — M6281 Muscle weakness (generalized): Secondary | ICD-10-CM | POA: Diagnosis not present

## 2020-10-17 NOTE — Therapy (Signed)
[facility] [address] [place], [name], [address] Phone: [phone]   Fax:  [phone]  Physical Therapy Treatment  Patient Details  Name: Sara Brown MRN: [id number] Date of Birth: 1940-08-03 Referring Provider (PT): Cristal Ford (hospitalist) but PCP is Seward Carol   Encounter Date: 10/17/2020   PT End of Session - 10/17/20 0941    Visit Number 8    Number of Visits 11    Date for PT Re-Evaluation 12/07/20   60 day poc, 33 day cert   Authorization Type Medicare so 10th visit progress note    PT Start Time 0940   pt arrived late   PT Stop Time 1013    PT Time Calculation (min) 33 min    Equipment Utilized During Treatment Gait belt    Activity Tolerance Patient tolerated treatment well    Behavior During Therapy [facility] for tasks assessed/performed           Past Medical History:  Diagnosis Date  . Allergic rhinitis   . Arthritis    "fingers, knees" (02/09/2018)  . Asthma    "stopped taking RX after dr said I don't have this" (02/09/2018)  . Carotid stenosis    ICA(L)  . Complication of anesthesia    "felt the cut w/one of my c-sections" (02/09/2018)  . Constipation   . GERD (gastroesophageal reflux disease)   . Headache    "years ago; before menopause" (02/09/2018)  . Hyperlipidemia   . Hypertension   . Neuropathy    face (R)  . OA (osteoarthritis)   . Osteoporosis   . TIA (transient ischemic attack) ?2005  . Type II diabetes mellitus ([place])   . Vitamin D deficiency     Past Surgical History:  Procedure Laterality Date  . CATARACT EXTRACTION W/ INTRAOCULAR LENS  IMPLANT, BILATERAL Bilateral   . CESAREAN SECTION  1971; 1975  . INTRAVASCULAR PRESSURE WIRE/FFR STUDY N/A 04/01/2018   Procedure: INTRAVASCULAR PRESSURE WIRE/FFR STUDY;  Surgeon: Nigel Mormon, MD;  Location: [facility];  Service: Cardiovascular;  Laterality: N/A;  . LEFT HEART CATH AND CORONARY ANGIOGRAPHY N/A  04/01/2018   Procedure: LEFT HEART CATH AND CORONARY ANGIOGRAPHY;  Surgeon: Nigel Mormon, MD;  Location: [facility];  Service: Cardiovascular;  Laterality: N/A;  . TUBAL LIGATION  1975    Vitals:   10/17/20 0941  BP: 130/62     Subjective Assessment - 10/17/20 0941    Subjective Pt reports she is not feeling real great today. Took her meds this morning before eating but did have some crackers after. She reports that Saturday she as not feeling well at all. Left leg was moving slow and she was having those weird feelings in her tongue, face and head.    Pertinent History 80 y.o. female with medical history significant of hypertension, hyperlipidemia, nonobstructive CAD, left carotid stenosis, TIA, diabetes mellitus type 2, asthma, vitamin D deficiency, and GERD who with complaints of a weird sensation on her left side.    Currently in Pain? Yes    Pain Score 4     Pain Location Head    Pain Orientation --   top of head   Pain Descriptors / Indicators Headache    Pain Type Acute pain    Pain Onset More than a month ago    Pain Frequency Intermittent  [facility] Adult PT Treatment/Exercise - 10/17/20 0947      Ambulation/Gait   Ambulation/Gait Yes    Ambulation/Gait Assistance 5: Supervision    Ambulation/Gait Assistance Details around in clinic between activities.    Assistive device None      Neuro Re-ed    Neuro Re-ed Details  Standing on airex by counter: feet apart eyes open/eyes closed x 30 sec each, feet together eyes open/eyes closed x 30 sec each with increased sway eyes closed, feet together with head turns left/right x 10 then up/down x 10, staggered stance 30 sec each position. Marching in place on airex x 10 with cues to control steps to increase SLS time. Tapping 3 different colored cones on command x 10 each leg CGA. BP=130/62 after activities.                    PT Short Term Goals - 10/06/20 0859       PT SHORT TERM GOAL #1   Title Pt will be independent with initial HEP for balance.    Baseline reports independence, only completing 2x/week currently    Time 4    Period Weeks    Status Achieved    Target Date 10/08/20      PT SHORT TERM GOAL #2   Title Pt will decrease 5 x sit to stand from 12.34 sec to <11 sec for improved balance and functional strength.    Baseline 09/08/20 12.34 sec from mat without hands; 10.59 secs without UE support    Time 4    Period Weeks    Status Achieved    Target Date 10/08/20             PT Long Term Goals - 09/08/20 1901      PT LONG TERM GOAL #1   Title Pt will ambulate >1000' on varied surfaces independently for improved community mobility.    Time 8    Period Weeks    Status New    Target Date 11/07/20      PT LONG TERM GOAL #2   Title Pt will increase FGA from 20 to >24/30 for improved balance and gait safety.    Baseline 09/08/20 20/30    Time 8    Period Weeks    Status New    Target Date 11/07/20      PT LONG TERM GOAL #3   Title Pt will be able to perform gardening simulation activities without LOB for improved function and functional strength.    Time 8    Period Weeks    Status New    Target Date 11/07/20                 Plan - 10/17/20 1519    Clinical Impression Statement Pt was not feeling well when first arrived. Vitals good. Focused on balance activities today. Pt was challenged with eyes closed on compliant surface as well as increasing SLS time. Pt reported head feeling better at end of session.    Personal Factors and Comorbidities Comorbidity 3+    Comorbidities hypertension, hyperlipidemia, nonobstructive CAD, left carotid stenosis, TIA, diabetes mellitus type 2, asthma, vitamin D deficiency, and GERD    Examination-Activity Limitations Locomotion Level;Stairs    Examination-Participation Restrictions Community Activity;Yard Work    Stability/Clinical Decision Making Evolving/Moderate complexity     Rehab Potential Good    PT Frequency 2x / week   followed by 1x/week for 6 weeks   PT Duration 2 weeks  PT Treatment/Interventions ADLs/Self Care Home Management;DME Instruction;Gait training;Stair training;Functional mobility training;Therapeutic activities;Balance training;Neuromuscular re-education    PT Next Visit Plan Continue balance training with high level activities. Watch how knees do as had some random buckling epecially on left noted at times.    Consulted and Agree with Plan of Care Patient;Family member/caregiver    Family Member Consulted husband           Patient will benefit from skilled therapeutic intervention in order to improve the following deficits and impairments:  Abnormal gait,Decreased balance,Decreased mobility,Decreased strength  Visit Diagnosis: Other abnormalities of gait and mobility  Unsteadiness on feet     Problem List Patient Active Problem List   Diagnosis Date Noted  . Paresthesias 09/05/2020  . Age-related osteoporosis without current pathological fracture 08/18/2020  . Pure hypercholesterolemia 08/18/2020  . Chronic constipation 08/18/2020  . Right ear impacted cerumen 10/26/2019  . Coronary artery disease involving native coronary artery of native heart without angina pectoris 09/17/2018  . Diabetes mellitus type 2, controlled ([place]) 03/31/2018  . Essential hypertension 03/31/2018  . Dyslipidemia 03/31/2018  . Chest pain 02/10/2018  . Chest pain due to coronary artery disease ([place]) 02/09/2018  . Angina at rest [facility]) 02/09/2018  . Bradycardia 02/09/2018  . Skin laxity 06/12/2017  . Hirsutism 06/12/2017  . Hyperpigmentation 02/13/2017  . Neck mass 01/17/2017  . 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 Ear pain, left 02/21/2016  . GERD (gastroesophageal reflux disease)   . Shortness of breath 07/30/2007  . Cough 07/30/2007    Electa Sniff, PT, DPT, NCS 10/17/2020, 3:21  PM  [facility] [address] [place], [name], [address] Phone: [phone]   Fax:  [phone]  Name: Seleny Allbright MRN: [id number] Date of Birth: 03/25/1941

## 2020-10-19 ENCOUNTER — Other Ambulatory Visit: Payer: Self-pay | Admitting: Internal Medicine

## 2020-10-19 DIAGNOSIS — Z1231 Encounter for screening mammogram for malignant neoplasm of breast: Secondary | ICD-10-CM

## 2020-10-21 ENCOUNTER — Telehealth: Payer: Self-pay | Admitting: Neurology

## 2020-10-21 ENCOUNTER — Encounter: Payer: Self-pay | Admitting: Student

## 2020-10-21 NOTE — Telephone Encounter (Signed)
She is uncertain whether she took her morning dose of Keppra (on 500 mg po bid).   I advised her to just take an additional pill now and to continue this evening as scheduled

## 2020-10-22 ENCOUNTER — Other Ambulatory Visit: Payer: Self-pay | Admitting: Cardiology

## 2020-10-22 DIAGNOSIS — I10 Essential (primary) hypertension: Secondary | ICD-10-CM

## 2020-10-23 ENCOUNTER — Other Ambulatory Visit: Payer: Self-pay

## 2020-10-23 ENCOUNTER — Ambulatory Visit: Payer: PRIVATE HEALTH INSURANCE | Admitting: Cardiology

## 2020-10-23 ENCOUNTER — Ambulatory Visit: Payer: Medicare Other

## 2020-10-23 VITALS — BP 122/78

## 2020-10-23 DIAGNOSIS — R2681 Unsteadiness on feet: Secondary | ICD-10-CM

## 2020-10-23 DIAGNOSIS — M6281 Muscle weakness (generalized): Secondary | ICD-10-CM | POA: Diagnosis not present

## 2020-10-23 DIAGNOSIS — R2689 Other abnormalities of gait and mobility: Secondary | ICD-10-CM

## 2020-10-23 NOTE — Therapy (Signed)
[facility] [address] [place], [name], [address] Phone: [phone]   Fax:  [phone]  Physical Therapy Treatment  Patient Details  Name: Sara Brown MRN: [id number] Date of Birth: 12/15/1940 Referring Provider (PT): Cristal Ford (hospitalist) but PCP is Seward Carol   Encounter Date: 10/23/2020   PT End of Session - 10/23/20 0958    Visit Number 9    Number of Visits 11    Date for PT Re-Evaluation 12/07/20   60 day poc, 90 day cert   Authorization Type Medicare so 10th visit progress note    PT Start Time 0957    PT Stop Time 1038    PT Time Calculation (min) 41 min    Equipment Utilized During Treatment Gait belt    Activity Tolerance Patient tolerated treatment well    Behavior During Therapy [facility] for tasks assessed/performed           Past Medical History:  Diagnosis Date  . Allergic rhinitis   . Arthritis    "fingers, knees" (02/09/2018)  . Asthma    "stopped taking RX after dr said I don't have this" (02/09/2018)  . Carotid stenosis    ICA(L)  . Complication of anesthesia    "felt the cut w/one of my c-sections" (02/09/2018)  . Constipation   . GERD (gastroesophageal reflux disease)   . Headache    "years ago; before menopause" (02/09/2018)  . Hyperlipidemia   . Hypertension   . Neuropathy    face (R)  . OA (osteoarthritis)   . Osteoporosis   . TIA (transient ischemic attack) ?2005  . Type II diabetes mellitus ([place])   . Vitamin D deficiency     Past Surgical History:  Procedure Laterality Date  . CATARACT EXTRACTION W/ INTRAOCULAR LENS  IMPLANT, BILATERAL Bilateral   . CESAREAN SECTION  1971; 1975  . INTRAVASCULAR PRESSURE WIRE/FFR STUDY N/A 04/01/2018   Procedure: INTRAVASCULAR PRESSURE WIRE/FFR STUDY;  Surgeon: Nigel Mormon, MD;  Location: [facility];  Service: Cardiovascular;  Laterality: N/A;  . LEFT HEART CATH AND CORONARY ANGIOGRAPHY N/A 04/01/2018    Procedure: LEFT HEART CATH AND CORONARY ANGIOGRAPHY;  Surgeon: Nigel Mormon, MD;  Location: [facility];  Service: Cardiovascular;  Laterality: N/A;  . TUBAL LIGATION  1975    Vitals:   10/23/20 1002  BP: 122/78     Subjective Assessment - 10/23/20 0958    Subjective Pt reports she is feeling better today. She did have a fall last Wednesday. Reports that she bruised her right knee and is sore under her right breast. Reports that she was outside carrying a basin of water and tripped on something in the dark. She was able to get herself up.    Pertinent History 80 y.o. female with medical history significant of hypertension, hyperlipidemia, nonobstructive CAD, left carotid stenosis, TIA, diabetes mellitus type 2, asthma, vitamin D deficiency, and GERD who with complaints of a weird sensation on her left side.    Currently in Pain? No/denies    Pain Onset More than a month ago                             [facility] Adult PT Treatment/Exercise - 10/23/20 1004      Ambulation/Gait   Ambulation/Gait Yes    Ambulation/Gait Assistance 6: Modified independent (Device/Increase time);5: Supervision    Ambulation/Gait Assistance Details Verbal cues to increase  foot clearance as scuffs feet at times.    Ambulation Distance (Feet) 850 Feet    Assistive device None    Gait Pattern Step-through pattern    Ambulation Surface Level;Unlevel;Indoor;Outdoor;Paved;Grass      Neuro Re-ed    Neuro Re-ed Details  Dynamic gait over obstacle course: reciprocal steps over 6 hurdles of varied height, reciprocal steps over 4 cones tapping cone first to increase SLS time then marching over mat x 6 bouts through course CGA. Over obstacle course: side stepping over hurdles, weaving in and out of cones, backwards walking over mat x 4 bouts.  Standing on airex: feet together eyes closed x 30 sec x 2, head turns left/right and up/down x 10 each. Narrow staggered stance on airex with eyes closed  x 30 sec each position then with eyes open and horizontal had turns x 10 each position. Pt had increased sway with eyes closed and horizontal head turns but no physical assist needed just close SBA/CGA for safety. BP=120/76 after                  PT Education - 10/23/20 1044    Education Details PT discussed 1 visit remaining. Will reassess and determine if ready for d/c versus recert.    Person(s) Educated Patient    Methods Explanation    Comprehension Verbalized understanding            PT Short Term Goals - 10/06/20 0859      PT SHORT TERM GOAL #1   Title Pt will be independent with initial HEP for balance.    Baseline reports independence, only completing 2x/week currently    Time 4    Period Weeks    Status Achieved    Target Date 10/08/20      PT SHORT TERM GOAL #2   Title Pt will decrease 5 x sit to stand from 12.34 sec to <11 sec for improved balance and functional strength.    Baseline 09/08/20 12.34 sec from mat without hands; 10.59 secs without UE support    Time 4    Period Weeks    Status Achieved    Target Date 10/08/20             PT Long Term Goals - 09/08/20 1901      PT LONG TERM GOAL #1   Title Pt will ambulate >1000' on varied surfaces independently for improved community mobility.    Time 8    Period Weeks    Status New    Target Date 11/07/20      PT LONG TERM GOAL #2   Title Pt will increase FGA from 20 to >24/30 for improved balance and gait safety.    Baseline 09/08/20 20/30    Time 8    Period Weeks    Status New    Target Date 11/07/20      PT LONG TERM GOAL #3   Title Pt will be able to perform gardening simulation activities without LOB for improved function and functional strength.    Time 8    Period Weeks    Status New    Target Date 11/07/20                 Plan - 10/23/20 1045    Clinical Impression Statement Pt had no LOB with gait outside today. Does scuff feet at times needing cues to increase foot  clearance. She did better with SLS activities today with improving stability.  Personal Factors and Comorbidities Comorbidity 3+    Comorbidities hypertension, hyperlipidemia, nonobstructive CAD, left carotid stenosis, TIA, diabetes mellitus type 2, asthma, vitamin D deficiency, and GERD    Examination-Activity Limitations Locomotion Level;Stairs    Examination-Participation Restrictions Community Activity;Yard Work    [employer] Evolving/Moderate complexity    Rehab Potential Good    PT Frequency 2x / week   followed by 1x/week for 6 weeks   PT Duration 2 weeks    PT Treatment/Interventions ADLs/Self Care Home Management;DME Instruction;Gait training;Stair training;Functional mobility training;Therapeutic activities;Balance training;Neuromuscular re-education    PT Next Visit Plan Check goals next visit for possible d/c. 10th visit progress note    Consulted and Agree with Plan of Care Patient;Family member/caregiver    Family Member Consulted husband           Patient will benefit from skilled therapeutic intervention in order to improve the following deficits and impairments:  Abnormal gait,Decreased balance,Decreased mobility,Decreased strength  Visit Diagnosis: Other abnormalities of gait and mobility  Unsteadiness on feet     Problem List Patient Active Problem List   Diagnosis Date Noted  . Paresthesias 09/05/2020  . Age-related osteoporosis without current pathological fracture 08/18/2020  . Pure hypercholesterolemia 08/18/2020  . Chronic constipation 08/18/2020  . Right ear impacted cerumen 10/26/2019  . Coronary artery disease involving native coronary artery of native heart without angina pectoris 09/17/2018  . Diabetes mellitus type 2, controlled ([place]) 03/31/2018  . Essential hypertension 03/31/2018  . Dyslipidemia 03/31/2018  . Chest pain 02/10/2018  . Chest pain due to coronary artery disease ([place]) 02/09/2018  . Angina at rest [facility])  02/09/2018  . Bradycardia 02/09/2018  . Skin laxity 06/12/2017  . Hirsutism 06/12/2017  . Hyperpigmentation 02/13/2017  . Neck mass 01/17/2017  . 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 Ear pain, left 02/21/2016  . GERD (gastroesophageal reflux disease)   . Shortness of breath 07/30/2007  . Cough 07/30/2007    Electa Sniff, PT, DPT, NCS 10/23/2020, 10:50 AM  [facility] [address] [place], [name], [address] Phone: [phone]   Fax:  [phone]  Name: Sara Brown MRN: [id number] Date of Birth: [birth year]

## 2020-10-24 DIAGNOSIS — H6123 Impacted cerumen, bilateral: Secondary | ICD-10-CM | POA: Diagnosis not present

## 2020-10-25 ENCOUNTER — Other Ambulatory Visit: Payer: Self-pay

## 2020-10-25 ENCOUNTER — Ambulatory Visit
Admission: RE | Admit: 2020-10-25 | Discharge: 2020-10-25 | Disposition: A | Discharge status: Home / Self Care | Payer: Medicare Other | Source: Ambulatory Visit | Attending: Internal Medicine | Admitting: Internal Medicine

## 2020-10-25 DIAGNOSIS — Z1231 Encounter for screening mammogram for malignant neoplasm of breast: Secondary | ICD-10-CM

## 2020-10-30 ENCOUNTER — Ambulatory Visit: Payer: Medicare Other

## 2020-10-30 ENCOUNTER — Other Ambulatory Visit: Payer: Self-pay

## 2020-10-30 VITALS — BP 122/68

## 2020-10-30 DIAGNOSIS — R2681 Unsteadiness on feet: Secondary | ICD-10-CM

## 2020-10-30 DIAGNOSIS — M6281 Muscle weakness (generalized): Secondary | ICD-10-CM | POA: Diagnosis not present

## 2020-10-30 DIAGNOSIS — R2689 Other abnormalities of gait and mobility: Secondary | ICD-10-CM

## 2020-10-30 NOTE — Therapy (Signed)
[facility] [address] [place], [name], [address] Phone: [phone]   Fax:  [phone]  Physical Therapy Treatment/ Discharge visit  Patient Details  Name: Sara Brown MRN: [id number] Date of Birth: 17-May-1941 Referring Provider (PT): Cristal Ford (hospitalist) but PCP is Seward Carol  PHYSICAL THERAPY DISCHARGE SUMMARY  Visits from Start of Care: 10  Current functional level related to goals / functional outcomes: See clinical impression and goals for more information. Pt ambulating independently and is low fall risk.   Remaining deficits: High level balance/SLS tasks   Education / Equipment: HEP  Plan: Patient agrees to discharge.  Patient goals were met. Patient is being discharged due to meeting the stated rehab goals.  ?????           Progress Note  Reporting period 09/08/20 to 10/30/20  See Note below for Objective Data and Assessment of Progress/Goals   Encounter Date: 10/30/2020   PT End of Session - 10/30/20 0936    Visit Number 10    Number of Visits 11    Date for PT Re-Evaluation 12/07/20   60 day poc, 90 day cert   Authorization Type Medicare so 10th visit progress note    PT Start Time 0934    PT Stop Time 1003   discharge visit   PT Time Calculation (min) 29 min    Equipment Utilized During Treatment Gait belt    Activity Tolerance Patient tolerated treatment well    Behavior During Therapy WFL for tasks assessed/performed           Past Medical History:  Diagnosis Date  . Allergic rhinitis   . Arthritis    "fingers, knees" (02/09/2018)  . Asthma    "stopped taking RX after dr said I don't have this" (02/09/2018)  . Carotid stenosis    ICA(L)  . Complication of anesthesia    "felt the cut w/one of my c-sections" (02/09/2018)  . Constipation   . GERD (gastroesophageal reflux disease)   . Headache    "years ago; before menopause" (02/09/2018)  . Hyperlipidemia    . Hypertension   . Neuropathy    face (R)  . OA (osteoarthritis)   . Osteoporosis   . TIA (transient ischemic attack) ?2005  . Type II diabetes mellitus ([place])   . Vitamin D deficiency     Past Surgical History:  Procedure Laterality Date  . CATARACT EXTRACTION W/ INTRAOCULAR LENS  IMPLANT, BILATERAL Bilateral   . CESAREAN SECTION  1971; 1975  . INTRAVASCULAR PRESSURE WIRE/FFR STUDY N/A 04/01/2018   Procedure: INTRAVASCULAR PRESSURE WIRE/FFR STUDY;  Surgeon: Nigel Mormon, MD;  Location: [facility];  Service: Cardiovascular;  Laterality: N/A;  . LEFT HEART CATH AND CORONARY ANGIOGRAPHY N/A 04/01/2018   Procedure: LEFT HEART CATH AND CORONARY ANGIOGRAPHY;  Surgeon: Nigel Mormon, MD;  Location: [facility];  Service: Cardiovascular;  Laterality: N/A;  . TUBAL LIGATION  1975    Vitals:   10/30/20 0939  BP: 122/68     Subjective Assessment - 10/30/20 0936    Subjective Pt reports she was not feeling well last night. Reports BP was 145/80. She took a garlic pill. Also reports she was having pain in left arm that she had not experienced before. Reports that it lasted about an hour. Reports that she took all her BP meds already this morning. Also reports that she bumped her head on cabinet in laundry room this morning but no  longer hurting.    Pertinent History 80 y.o. female with medical history significant of hypertension, hyperlipidemia, nonobstructive CAD, left carotid stenosis, TIA, diabetes mellitus type 2, asthma, vitamin D deficiency, and GERD who with complaints of a weird sensation on her left side.    Currently in Pain? No/denies    Pain Onset More than a month ago              [facility] PT Assessment - 10/30/20 0941      Functional Gait  Assessment   Gait assessed  Yes    Gait Level Surface Walks 20 ft in less than 5.5 sec, no assistive devices, good speed, no evidence for imbalance, normal gait pattern, deviates no more than 6 in outside of  the 12 in walkway width.    Change in Gait Speed Able to smoothly change walking speed without loss of balance or gait deviation. Deviate no more than 6 in outside of the 12 in walkway width.    Gait with Horizontal Head Turns Performs head turns smoothly with no change in gait. Deviates no more than 6 in outside 12 in walkway width    Gait with Vertical Head Turns Performs head turns with no change in gait. Deviates no more than 6 in outside 12 in walkway width.    Gait and Pivot Turn Pivot turns safely within 3 sec and stops quickly with no loss of balance.    Step Over Obstacle Is able to step over 2 stacked shoe boxes taped together (9 in total height) without changing gait speed. No evidence of imbalance.    Gait with Narrow Base of Support Is able to ambulate for 10 steps heel to toe with no staggering.    Gait with Eyes Closed Walks 20 ft, no assistive devices, good speed, no evidence of imbalance, normal gait pattern, deviates no more than 6 in outside 12 in walkway width. Ambulates 20 ft in less than 7 sec.    Ambulating Backwards Walks 20 ft, no assistive devices, good speed, no evidence for imbalance, normal gait    Steps Alternating feet, no rail.    Total Score 30                         [facility] Adult PT Treatment/Exercise - 10/30/20 0941      Ambulation/Gait   Ambulation/Gait Yes    Ambulation/Gait Assistance 7: Independent    Ambulation/Gait Assistance Details Pt ambulated on sidewalk, grass and gravel with no LOB.    Ambulation Distance (Feet) 1200 Feet    Assistive device None    Gait Pattern Step-through pattern    Ambulation Surface Level;Unlevel;Outdoor;Paved;Gravel;Grass    Stairs Yes    Stairs Assistance 7: Independent    Stair Management Technique No rails;Alternating pattern    Number of Stairs 4    Height of Stairs 6      Neuro Re-ed    Neuro Re-ed Details  Garden simulation task on red mat picking up bean bags off mat and placing on cone x 6. Pt  was given cues to try to bend a little more from knees to keep back a little straighter to protect back. No LOB. Pt denies any issues with gardening at home.                  PT Education - 10/30/20 1006    Education Details Discussed proceeding with d/c as planned. Went over results of testing. Pt to  continue with current HEP    Person(s) Educated Patient    Methods Explanation            PT Short Term Goals - 10/06/20 0859      PT SHORT TERM GOAL #1   Title Pt will be independent with initial HEP for balance.    Baseline reports independence, only completing 2x/week currently    Time 4    Period Weeks    Status Achieved    Target Date 10/08/20      PT SHORT TERM GOAL #2   Title Pt will decrease 5 x sit to stand from 12.34 sec to <11 sec for improved balance and functional strength.    Baseline 09/08/20 12.34 sec from mat without hands; 10.59 secs without UE support    Time 4    Period Weeks    Status Achieved    Target Date 10/08/20             PT Long Term Goals - 10/30/20 0947      PT LONG TERM GOAL #1   Title Pt will ambulate >1000' on varied surfaces independently for improved community mobility.    Baseline 10/30/20 1200' independently on varied surfaces.    Time 8    Period Weeks    Status Achieved      PT LONG TERM GOAL #2   Title Pt will increase FGA from 20 to >24/30 for improved balance and gait safety.    Baseline 09/08/20 20/30. 10/30/20 30/30    Time 8    Period Weeks    Status Achieved      PT LONG TERM GOAL #3   Title Pt will be able to perform gardening simulation activities without LOB for improved function and functional strength.    Baseline 10/30/20 Pt able to perform simulation task in home and reports performing at home with no difficulty.    Time 8    Period Weeks    Status Achieved                 Plan - 10/30/20 1008    Clinical Impression Statement PT assessed LTGs today with pt meeting 3/3 goals. She improvined FGA to  30/30 indicating low fall risk with perfect score now. She was able to ambulate >1200' on varied surfaces with no LOB today. Pt is performing garden tasks at home and with simulation tasks with no LOB. Pt was instructed to continue to work on balance activities from HEP on compliant surfaces. PT proceeding with discharge as planned.    Personal Factors and Comorbidities Comorbidity 3+    Comorbidities hypertension, hyperlipidemia, nonobstructive CAD, left carotid stenosis, TIA, diabetes mellitus type 2, asthma, vitamin D deficiency, and GERD    Examination-Activity Limitations Locomotion Level;Stairs    Examination-Participation Restrictions Community Activity;Yard Work    Merchant navy officer Evolving/Moderate complexity    Rehab Potential Good    PT Frequency 2x / week   followed by 1x/week for 6 weeks   PT Duration 2 weeks    PT Treatment/Interventions ADLs/Self Care Home Management;DME Instruction;Gait training;Stair training;Functional mobility training;Therapeutic activities;Balance training;Neuromuscular re-education    PT Next Visit Plan Discharge PT today    Consulted and Agree with Plan of Care Patient;Family member/caregiver    Family Member Consulted husband           Patient will benefit from skilled therapeutic intervention in order to improve the following deficits and impairments:  Abnormal gait,Decreased balance,Decreased mobility,Decreased strength  Visit  Diagnosis: Other abnormalities of gait and mobility  Unsteadiness on feet     Problem List Patient Active Problem List   Diagnosis Date Noted  . Paresthesias 09/05/2020  . Age-related osteoporosis without current pathological fracture 08/18/2020  . Pure hypercholesterolemia 08/18/2020  . Chronic constipation 08/18/2020  . Right ear impacted cerumen 10/26/2019  . Coronary artery disease involving native coronary artery of native heart without angina pectoris 09/17/2018  . Diabetes mellitus type 2,  controlled ([place]) 03/31/2018  . Essential hypertension 03/31/2018  . Dyslipidemia 03/31/2018  . Chest pain 02/10/2018  . Chest pain due to coronary artery disease ([place]) 02/09/2018  . Angina at rest [facility]) 02/09/2018  . Bradycardia 02/09/2018  . Skin laxity 06/12/2017  . Hirsutism 06/12/2017  . Hyperpigmentation 02/13/2017  . Neck mass 01/17/2017  . 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 Ear pain, left 02/21/2016  . GERD (gastroesophageal reflux disease)   . Shortness of breath 07/30/2007  . Cough 07/30/2007    Electa Sniff, PT, DPT, NCS 10/30/2020, 10:10 AM  [facility] [address] [place], [name], [address] Phone: [phone]   Fax:  [phone]  Name: Sara Brown MRN: [id number] Date of Birth: [birth year]

## 2020-11-08 DIAGNOSIS — I10 Essential (primary) hypertension: Secondary | ICD-10-CM | POA: Diagnosis not present

## 2020-11-13 ENCOUNTER — Inpatient Hospital Stay (HOSPITAL_COMMUNITY)
Admission: EM | Admit: 2020-11-13 | Discharge: 2020-11-15 | DRG: 069 | Disposition: A | Discharge status: Home / Self Care | Payer: Medicare Other | Attending: Internal Medicine | Admitting: Internal Medicine

## 2020-11-13 ENCOUNTER — Encounter (HOSPITAL_COMMUNITY): Payer: Self-pay

## 2020-11-13 ENCOUNTER — Telehealth: Payer: Self-pay

## 2020-11-13 ENCOUNTER — Emergency Department (HOSPITAL_COMMUNITY): Payer: Medicare Other

## 2020-11-13 DIAGNOSIS — I6523 Occlusion and stenosis of bilateral carotid arteries: Secondary | ICD-10-CM | POA: Diagnosis not present

## 2020-11-13 DIAGNOSIS — G8191 Hemiplegia, unspecified affecting right dominant side: Secondary | ICD-10-CM | POA: Diagnosis present

## 2020-11-13 DIAGNOSIS — E119 Type 2 diabetes mellitus without complications: Secondary | ICD-10-CM

## 2020-11-13 DIAGNOSIS — R29818 Other symptoms and signs involving the nervous system: Secondary | ICD-10-CM | POA: Diagnosis not present

## 2020-11-13 DIAGNOSIS — M810 Age-related osteoporosis without current pathological fracture: Secondary | ICD-10-CM | POA: Diagnosis present

## 2020-11-13 DIAGNOSIS — Z888 Allergy status to other drugs, medicaments and biological substances status: Secondary | ICD-10-CM

## 2020-11-13 DIAGNOSIS — I639 Cerebral infarction, unspecified: Secondary | ICD-10-CM | POA: Diagnosis not present

## 2020-11-13 DIAGNOSIS — I10 Essential (primary) hypertension: Secondary | ICD-10-CM | POA: Diagnosis present

## 2020-11-13 DIAGNOSIS — E559 Vitamin D deficiency, unspecified: Secondary | ICD-10-CM | POA: Diagnosis present

## 2020-11-13 DIAGNOSIS — Z886 Allergy status to analgesic agent status: Secondary | ICD-10-CM

## 2020-11-13 DIAGNOSIS — E1140 Type 2 diabetes mellitus with diabetic neuropathy, unspecified: Secondary | ICD-10-CM | POA: Diagnosis present

## 2020-11-13 DIAGNOSIS — Z7982 Long term (current) use of aspirin: Secondary | ICD-10-CM

## 2020-11-13 DIAGNOSIS — Z833 Family history of diabetes mellitus: Secondary | ICD-10-CM

## 2020-11-13 DIAGNOSIS — Z8249 Family history of ischemic heart disease and other diseases of the circulatory system: Secondary | ICD-10-CM

## 2020-11-13 DIAGNOSIS — Z20822 Contact with and (suspected) exposure to covid-19: Secondary | ICD-10-CM | POA: Diagnosis present

## 2020-11-13 DIAGNOSIS — R202 Paresthesia of skin: Secondary | ICD-10-CM | POA: Diagnosis not present

## 2020-11-13 DIAGNOSIS — G40109 Localization-related (focal) (partial) symptomatic epilepsy and epileptic syndromes with simple partial seizures, not intractable, without status epilepticus: Secondary | ICD-10-CM | POA: Diagnosis present

## 2020-11-13 DIAGNOSIS — M1990 Unspecified osteoarthritis, unspecified site: Secondary | ICD-10-CM | POA: Diagnosis present

## 2020-11-13 DIAGNOSIS — J309 Allergic rhinitis, unspecified: Secondary | ICD-10-CM | POA: Diagnosis present

## 2020-11-13 DIAGNOSIS — G459 Transient cerebral ischemic attack, unspecified: Principal | ICD-10-CM

## 2020-11-13 DIAGNOSIS — Z79899 Other long term (current) drug therapy: Secondary | ICD-10-CM

## 2020-11-13 DIAGNOSIS — I2510 Atherosclerotic heart disease of native coronary artery without angina pectoris: Secondary | ICD-10-CM | POA: Diagnosis not present

## 2020-11-13 DIAGNOSIS — R200 Anesthesia of skin: Secondary | ICD-10-CM

## 2020-11-13 DIAGNOSIS — E785 Hyperlipidemia, unspecified: Secondary | ICD-10-CM | POA: Diagnosis present

## 2020-11-13 DIAGNOSIS — Z8262 Family history of osteoporosis: Secondary | ICD-10-CM

## 2020-11-13 DIAGNOSIS — R001 Bradycardia, unspecified: Secondary | ICD-10-CM | POA: Diagnosis not present

## 2020-11-13 DIAGNOSIS — K219 Gastro-esophageal reflux disease without esophagitis: Secondary | ICD-10-CM | POA: Diagnosis present

## 2020-11-13 DIAGNOSIS — Z7983 Long term (current) use of bisphosphonates: Secondary | ICD-10-CM

## 2020-11-13 DIAGNOSIS — K5909 Other constipation: Secondary | ICD-10-CM | POA: Diagnosis present

## 2020-11-13 DIAGNOSIS — R29701 NIHSS score 1: Secondary | ICD-10-CM | POA: Diagnosis present

## 2020-11-13 LAB — URINALYSIS, ROUTINE W REFLEX MICROSCOPIC
Bilirubin Urine: NEGATIVE
Glucose, UA: NEGATIVE mg/dL
Hgb urine dipstick: NEGATIVE
Ketones, ur: NEGATIVE mg/dL
Leukocytes,Ua: NEGATIVE
Nitrite: NEGATIVE
Protein, ur: NEGATIVE mg/dL
Specific Gravity, Urine: 1.005 (ref 1.005–1.030)
pH: 9 — ABNORMAL HIGH (ref 5.0–8.0)

## 2020-11-13 LAB — CBC
HCT: 37.3 % (ref 36.0–46.0)
Hemoglobin: 12.3 g/dL (ref 12.0–15.0)
MCH: 29.8 pg (ref 26.0–34.0)
MCHC: 33 g/dL (ref 30.0–36.0)
MCV: 90.3 fL (ref 80.0–100.0)
Platelets: 236 10*3/uL (ref 150–400)
RBC: 4.13 MIL/uL (ref 3.87–5.11)
RDW: 13.1 % (ref 11.5–15.5)
WBC: 4.5 10*3/uL (ref 4.0–10.5)
nRBC: 0 % (ref 0.0–0.2)

## 2020-11-13 LAB — I-STAT CHEM 8, ED
BUN: 16 mg/dL (ref 8–23)
Calcium, Ion: 1.26 mmol/L (ref 1.15–1.40)
Chloride: 102 mmol/L (ref 98–111)
Creatinine, Ser: 0.8 mg/dL (ref 0.44–1.00)
Glucose, Bld: 144 mg/dL — ABNORMAL HIGH (ref 70–99)
HCT: 39 % (ref 36.0–46.0)
Hemoglobin: 13.3 g/dL (ref 12.0–15.0)
Potassium: 4 mmol/L (ref 3.5–5.1)
Sodium: 139 mmol/L (ref 135–145)
TCO2: 25 mmol/L (ref 22–32)

## 2020-11-13 LAB — RAPID URINE DRUG SCREEN, HOSP PERFORMED
Amphetamines: NOT DETECTED
Barbiturates: NOT DETECTED
Benzodiazepines: NOT DETECTED
Cocaine: NOT DETECTED
Opiates: NOT DETECTED
Tetrahydrocannabinol: NOT DETECTED

## 2020-11-13 LAB — DIFFERENTIAL
Abs Immature Granulocytes: 0.02 10*3/uL (ref 0.00–0.07)
Basophils Absolute: 0 10*3/uL (ref 0.0–0.1)
Basophils Relative: 0 %
Eosinophils Absolute: 0.1 10*3/uL (ref 0.0–0.5)
Eosinophils Relative: 2 %
Immature Granulocytes: 0 %
Lymphocytes Relative: 24 %
Lymphs Abs: 1.1 10*3/uL (ref 0.7–4.0)
Monocytes Absolute: 0.4 10*3/uL (ref 0.1–1.0)
Monocytes Relative: 8 %
Neutro Abs: 3 10*3/uL (ref 1.7–7.7)
Neutrophils Relative %: 66 %

## 2020-11-13 LAB — RESP PANEL BY RT-PCR (FLU A&B, COVID) ARPGX2
Influenza A by PCR: NEGATIVE
Influenza B by PCR: NEGATIVE
SARS Coronavirus 2 by RT PCR: NEGATIVE

## 2020-11-13 LAB — COMPREHENSIVE METABOLIC PANEL
ALT: 32 U/L (ref 0–44)
AST: 29 U/L (ref 15–41)
Albumin: 3.9 g/dL (ref 3.5–5.0)
Alkaline Phosphatase: 53 U/L (ref 38–126)
Anion gap: 8 (ref 5–15)
BUN: 15 mg/dL (ref 8–23)
CO2: 25 mmol/L (ref 22–32)
Calcium: 10 mg/dL (ref 8.9–10.3)
Chloride: 102 mmol/L (ref 98–111)
Creatinine, Ser: 0.82 mg/dL (ref 0.44–1.00)
GFR, Estimated: >60 mL/min (ref >60)
Glucose, Bld: 149 mg/dL — ABNORMAL HIGH (ref 70–99)
Potassium: 3.8 mmol/L (ref 3.5–5.1)
Sodium: 135 mmol/L (ref 135–145)
Total Bilirubin: 0.5 mg/dL (ref 0.3–1.2)
Total Protein: 7.3 g/dL (ref 6.5–8.1)

## 2020-11-13 LAB — PROTIME-INR
INR: 1.1 (ref 0.8–1.2)
Prothrombin Time: 13.7 seconds (ref 11.4–15.2)

## 2020-11-13 LAB — ETHANOL: Alcohol, Ethyl (B): <10 mg/dL (ref <10)

## 2020-11-13 LAB — APTT: aPTT: 27 seconds (ref 24–36)

## 2020-11-13 MED ORDER — SPIRONOLACTONE 25 MG PO TABS
50.0000 mg | ORAL_TABLET | Freq: Every day | ORAL | Status: DC
  Administered 2020-11-14 – 2020-11-15 (×2): 50 mg via ORAL
  Filled 2020-11-13 (×2): qty 2

## 2020-11-13 MED ORDER — CYCLOSPORINE 0.05 % OP EMUL
1.0000 [drp] | Freq: Two times a day (BID) | OPHTHALMIC | Status: DC
  Administered 2020-11-14 – 2020-11-15 (×2): 1 [drp] via OPHTHALMIC
  Filled 2020-11-13 (×5): qty 1

## 2020-11-13 MED ORDER — HYDRALAZINE HCL 50 MG PO TABS: 100.0000 mg | ORAL_TABLET | Freq: Four times a day (QID) | ORAL | Status: DC | PRN

## 2020-11-13 MED ORDER — FLUTICASONE FUROATE-VILANTEROL 100-25 MCG/INH IN AEPB
1.0000 | INHALATION_SPRAY | Freq: Every day | RESPIRATORY_TRACT | Status: DC
  Filled 2020-11-13: qty 28

## 2020-11-13 MED ORDER — ALBUTEROL SULFATE (2.5 MG/3ML) 0.083% IN NEBU: 3.0000 mL | INHALATION_SOLUTION | RESPIRATORY_TRACT | Status: DC | PRN

## 2020-11-13 MED ORDER — CALCIUM CARBONATE-VITAMIN D 500-200 MG-UNIT PO TABS
2.0000 | ORAL_TABLET | Freq: Every day | ORAL | Status: DC
  Administered 2020-11-14 – 2020-11-15 (×2): 2 via ORAL
  Filled 2020-11-13 (×2): qty 2

## 2020-11-13 MED ORDER — POLYETHYLENE GLYCOL 3350 17 G PO PACK
17.0000 g | PACK | Freq: Every day | ORAL | Status: DC | PRN
  Administered 2020-11-14: 17 g via ORAL
  Filled 2020-11-13: qty 1

## 2020-11-13 MED ORDER — FLUTICASONE PROPIONATE 50 MCG/ACT NA SUSP
1.0000 | Freq: Every day | NASAL | Status: DC
  Administered 2020-11-13 – 2020-11-15 (×3): 1 via NASAL
  Filled 2020-11-13: qty 16

## 2020-11-13 MED ORDER — SENNOSIDES-DOCUSATE SODIUM 8.6-50 MG PO TABS: 1.0000 | ORAL_TABLET | Freq: Every evening | ORAL | Status: DC | PRN

## 2020-11-13 MED ORDER — VITAMIN D 25 MCG (1000 UNIT) PO TABS
1000.0000 [IU] | ORAL_TABLET | Freq: Every day | ORAL | Status: DC
  Administered 2020-11-13 – 2020-11-15 (×3): 1000 [IU] via ORAL
  Filled 2020-11-13 (×3): qty 1

## 2020-11-13 MED ORDER — CALCIUM CITRATE-VITAMIN D 315-250 MG-UNIT PO TABS: 2.0000 | ORAL_TABLET | Freq: Every day | ORAL | Status: DC

## 2020-11-13 MED ORDER — PANTOPRAZOLE SODIUM 40 MG PO TBEC: 40.0000 mg | DELAYED_RELEASE_TABLET | Freq: Every day | ORAL | Status: DC | PRN

## 2020-11-13 MED ORDER — LEVETIRACETAM 500 MG PO TABS
500.0000 mg | ORAL_TABLET | Freq: Two times a day (BID) | ORAL | Status: DC
  Administered 2020-11-13 – 2020-11-15 (×4): 500 mg via ORAL
  Filled 2020-11-13 (×4): qty 1

## 2020-11-13 MED ORDER — CLOBETASOL PROPIONATE 0.05 % EX CREA: 1.0000 "application " | TOPICAL_CREAM | Freq: Two times a day (BID) | CUTANEOUS | Status: DC | PRN

## 2020-11-13 MED ORDER — PRAVASTATIN SODIUM 10 MG PO TABS
20.0000 mg | ORAL_TABLET | Freq: Every day | ORAL | Status: DC
  Administered 2020-11-14: 20 mg via ORAL
  Filled 2020-11-13: qty 2

## 2020-11-13 MED ORDER — MAGNESIUM 250 MG PO TABS: 250.0000 mg | ORAL_TABLET | Freq: Every day | ORAL | Status: DC

## 2020-11-13 MED ORDER — MAGNESIUM OXIDE -MG SUPPLEMENT 400 (240 MG) MG PO TABS
200.0000 mg | ORAL_TABLET | Freq: Every day | ORAL | Status: DC
  Administered 2020-11-14 – 2020-11-15 (×2): 200 mg via ORAL
  Filled 2020-11-13 (×2): qty 1

## 2020-11-13 MED ORDER — OMEPRAZOLE MAGNESIUM 20 MG PO TBEC: 20.0000 mg | DELAYED_RELEASE_TABLET | Freq: Every day | ORAL | Status: DC | PRN

## 2020-11-13 MED ORDER — ENOXAPARIN SODIUM 30 MG/0.3ML IJ SOSY
30.0000 mg | PREFILLED_SYRINGE | INTRAMUSCULAR | Status: DC
  Administered 2020-11-13 – 2020-11-14 (×2): 30 mg via SUBCUTANEOUS
  Filled 2020-11-13 (×2): qty 0.3

## 2020-11-13 MED ORDER — STROKE: EARLY STAGES OF RECOVERY BOOK
Freq: Once | Status: AC
  Filled 2020-11-13: qty 1

## 2020-11-13 MED ORDER — ACETAMINOPHEN 160 MG/5ML PO SOLN: 650.0000 mg | ORAL | Status: DC | PRN

## 2020-11-13 MED ORDER — TRIAMCINOLONE ACETONIDE 0.1 % EX CREA
1.0000 "application " | TOPICAL_CREAM | Freq: Two times a day (BID) | CUTANEOUS | Status: DC | PRN
  Administered 2020-11-15: 1 via TOPICAL
  Filled 2020-11-13: qty 15

## 2020-11-13 MED ORDER — ACETAMINOPHEN 325 MG PO TABS: 650.0000 mg | ORAL_TABLET | ORAL | Status: DC | PRN

## 2020-11-13 MED ORDER — CLONIDINE HCL 0.1 MG PO TABS
0.1000 mg | ORAL_TABLET | Freq: Every day | ORAL | Status: DC
  Administered 2020-11-13: 0.1 mg via ORAL
  Filled 2020-11-13: qty 1

## 2020-11-13 MED ORDER — ACETAMINOPHEN 650 MG RE SUPP: 650.0000 mg | RECTAL | Status: DC | PRN

## 2020-11-13 MED ORDER — ASPIRIN EC 81 MG PO TBEC
81.0000 mg | DELAYED_RELEASE_TABLET | Freq: Every day | ORAL | Status: DC
  Administered 2020-11-14 – 2020-11-15 (×2): 81 mg via ORAL
  Filled 2020-11-13 (×2): qty 1

## 2020-11-13 NOTE — ED Notes (Signed)
Pt tray was verified to have been ordered & will soon be delivered.

## 2020-11-13 NOTE — Code Documentation (Signed)
Stroke Response Nurse Documentation Code Documentation  Laurita Peron is a 80 y.o. female arriving to [facility] ED via Private Vehicle on 11/13/2020 with past medical hx of HLP, HTN, Diabetes. Code stroke was activated by ED. Patient from home where she was LKW at 1000 and now complaining of right sided numbness. Pt reports that she work up at her baseline and then started to have some numbness and pain in her right leg and her right arm that has not gone away.   Stroke team at the bedside on patient activation. Labs drawn and patient cleared for CT by EDP. Patient to CT with team. NIHSS 1, see documentation for details and code stroke times. Patient with right decreased sensation on exam. The following imaging was completed:  CT.   Patient is not a candidate for tPA due to being too mild to treat. Care/Plan: q30 NIHSS/VS until 1430, then q2 mNIHSS/VS. Bedside handoff with ED RN Arby Barrette.    Kathrin Greathouse  Stroke Response RN

## 2020-11-13 NOTE — ED Triage Notes (Signed)
Pt arrived POV c/o numbness and weakness in left leg x 2 days  At 1000 pt started to feel numbness and a "strange feeling" down right side of body   Pt states hx of TIA

## 2020-11-13 NOTE — ED Notes (Signed)
Pt continues to state that the Lt side of her tongue feels numb with a small difference of sensation in the same side cheek. Speech clear, tongue movement symmetrical, no droop, passed swallow screen, A/Ox4. NIH score 1.

## 2020-11-13 NOTE — ED Notes (Signed)
Patient ambulated to and from bathroom with a steady gait. 

## 2020-11-13 NOTE — ED Provider Notes (Signed)
[facility] EMERGENCY DEPARTMENT Provider Note   CSN: [id number] Arrival date & time: 11/13/20  1210     History Chief Complaint  Patient presents with   Code Stroke    Sara Brown is a 80 y.o. female.  HPI Patient has had stuttering symptoms of weakness and numbness over the past 2 days.  First the left leg was involved.  Today she experienced an unusual sensation on the right side of her body with numbness at 10 AM onset.  Patient reports at the time of my evaluation, symptoms are improved but she still has a sensation about her tongue that does not seem quite right.  She no longer is perceiving weakness or numbness to either extremity.    Past Medical History:  Diagnosis Date   Allergic rhinitis    Arthritis    "fingers, knees" (02/09/2018)   Asthma    "stopped taking RX after dr said I don't have this" (02/09/2018)   Carotid stenosis    ICA(L)   Complication of anesthesia    "felt the cut w/one of my c-sections" (02/09/2018)   Constipation    GERD (gastroesophageal reflux disease)    Headache    "years ago; before menopause" (02/09/2018)   Hyperlipidemia    Hypertension    Neuropathy    face (R)   OA (osteoarthritis)    Osteoporosis    TIA (transient ischemic attack) ?2005   Type II diabetes mellitus ([place])    Vitamin D deficiency     Patient Active Problem List   Diagnosis Date Noted   Paresthesias 09/05/2020   Age-related osteoporosis without current pathological fracture 08/18/2020   Pure hypercholesterolemia 08/18/2020   Chronic constipation 08/18/2020   Right ear impacted cerumen 10/26/2019   Coronary artery disease involving native coronary artery of native heart without angina pectoris 09/17/2018   Diabetes mellitus type 2, controlled ([place]) 03/31/2018   Essential hypertension 03/31/2018   Dyslipidemia 03/31/2018   Chest pain 02/10/2018   Chest pain due to coronary artery disease ([place]) 02/09/2018   Angina at rest [facility]) 02/09/2018    Bradycardia 02/09/2018   Skin laxity 06/12/2017   Hirsutism 06/12/2017   Hyperpigmentation 02/13/2017   Neck mass 01/17/2017   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   Asymmetrical hearing loss of left ear 02/21/2016   Ear pain, left 02/21/2016   GERD (gastroesophageal reflux disease)    Shortness of breath 07/30/2007   Cough 07/30/2007    Past Surgical History:  Procedure Laterality Date   CATARACT EXTRACTION W/ INTRAOCULAR LENS  IMPLANT, BILATERAL Bilateral    CESAREAN SECTION  1971; 1975   INTRAVASCULAR PRESSURE WIRE/FFR STUDY N/A 04/01/2018   Procedure: INTRAVASCULAR PRESSURE WIRE/FFR STUDY;  Surgeon: Nigel Mormon, MD;  Location: [facility];  Service: Cardiovascular;  Laterality: N/A;   LEFT HEART CATH AND CORONARY ANGIOGRAPHY N/A 04/01/2018   Procedure: LEFT HEART CATH AND CORONARY ANGIOGRAPHY;  Surgeon: Nigel Mormon, MD;  Location: [facility];  Service: Cardiovascular;  Laterality: N/A;   TUBAL LIGATION  1975     OB History   No obstetric history on file.     Family History  Problem Relation Age of Onset   Diabetes Mellitus II Mother    Osteoporosis Mother    Hypertension Mother    Diabetes Mellitus II Father    CVA Sister        16   CAD Sister        83  Hypertension Sister        4   Osteoarthritis Sister        4   Alcoholism Brother    Breast cancer Cousin 75    Social History   Tobacco Use   Smoking status: Never Smoker   Smokeless tobacco: Never Used  Scientific laboratory technician Use: Never used  Substance Use Topics   Alcohol use: Never   Drug use: Never    Home Medications Prior to Admission medications   Medication Sig Start Date End Date Taking? Authorizing Provider  acetaminophen (TYLENOL) 650 MG CR tablet Take 650 mg by mouth every 8 (eight) hours as needed for pain.    [provider]  ADVAIR DISKUS 100-50 MCG/DOSE AEPB Inhale 1 puff into the lungs 2 (two) times daily. 07/26/20   [provider]   albuterol (PROVENTIL HFA;VENTOLIN HFA) 108 (90 Base) MCG/ACT inhaler Inhale 1 puff into the lungs every 4 (four) hours as needed for wheezing. 07/23/18   [provider]  alendronate (FOSAMAX) 70 MG tablet Take 70 mg by mouth every Saturday. 08/09/18   [provider]  aspirin EC 81 MG tablet Take 81 mg by mouth daily.    [provider]  Calcium Citrate-Vitamin D 315-250 MG-UNIT TABS Take 2 tablets by mouth daily.    [provider]  carboxymethylcellulose (REFRESH PLUS) 0.5 % SOLN Place 1-2 drops into both eyes 3 (three) times daily.    [provider]  Cholecalciferol (VITAMIN D3) 10 MCG (400 UNIT) CAPS Take 800 Units by mouth daily.    [provider]  Clobetasol Prop Emollient Base (CLOBETASOL PROPIONATE E) 0.05 % emollient cream Apply 1 application topically 2 (two) times daily as needed. Patient taking differently: Apply 1 application topically 2 (two) times daily as needed (rash under breast). 12/08/19   Lavonna Monarch, MD  cloNIDine (CATAPRES) 0.1 MG tablet Take 0.1 mg by mouth See admin instructions. Take 1 tablet by mouth every night at bedtime. May take additional 1 tablet if blood pressure is high 10/01/17   [provider]  Coenzyme Q10 (COQ10) 200 MG CAPS Take 200 mg by mouth daily.    [provider]  cycloSPORINE (RESTASIS) 0.05 % ophthalmic emulsion Place 1 drop into both eyes 2 (two) times daily.     [provider]  fluticasone (FLONASE) 50 MCG/ACT nasal spray Place 1 spray into both nostrils at bedtime as needed for allergies or rhinitis.    [provider]  Garlic 1779 MG CAPS Take 1,000 mg by mouth daily.    [provider]  hydrALAZINE (APRESOLINE) 50 MG tablet TAKE 1 TABLET BY MOUTH THREE TIMES A DAY 10/23/20   Patwardhan, Manish J, MD  isosorbide dinitrate (ISORDIL) 20 MG tablet TAKE 1 TABLET BY MOUTH THREE TIMES A DAY 09/27/20   Patwardhan, Manish J, MD  levETIRAcetam (KEPPRA) 500  MG tablet Take 1 tablet (500 mg total) by mouth every 12 (twelve) hours. 10/12/20   Star Age, MD  Magnesium 250 MG TABS Take 250 mg by mouth daily.    [provider]  Multiple Vitamins-Minerals (MULTIVITAMIN WITH MINERALS) tablet Take 1 tablet by mouth daily. Centrum for Women    [provider]  omeprazole (PRILOSEC OTC) 20 MG tablet Take 20 mg by mouth daily as needed (for reflux).     [provider]  Pitavastatin Calcium (LIVALO) 1 MG TABS Take 1 mg by mouth daily.    [provider]  polyethylene  glycol (MIRALAX / GLYCOLAX) packet Take 17 g by mouth at bedtime.    [provider]  pyridOXINE (VITAMIN B-6) 100 MG tablet Take 100 mg by mouth daily.     [provider]  spironolactone (ALDACTONE) 50 MG tablet Take 50 mg by mouth daily.     [provider]  triamcinolone cream (KENALOG) 0.1 % Apply 1 application topically 2 (two) times daily as needed (rash/ eczema).  11/01/14   [provider]    Allergies    Naproxen, Prednisone, Amlodipine, and Losartan  Review of Systems   Review of Systems 10 systems reviewed and negative except as per HPI Physical Exam Updated Vital Signs BP 134/75 (BP Location: Left Arm)   Pulse 61   Temp 98.9 F (37.2 C) (Oral)   Resp 17   Wt 61 kg   SpO2 98%   BMI 21.38 kg/m   Physical Exam Constitutional:      Appearance: She is well-developed.  HENT:     Head: Normocephalic and atraumatic.  Eyes:     Pupils: Pupils are equal, round, and reactive to light.  Cardiovascular:     Rate and Rhythm: Normal rate and regular rhythm.     Heart sounds: Normal heart sounds.  Pulmonary:     Effort: Pulmonary effort is normal.     Breath sounds: Normal breath sounds.  Abdominal:     General: Bowel sounds are normal. There is no distension.     Palpations: Abdomen is soft.     Tenderness: There is no abdominal tenderness.  Musculoskeletal:        General: Normal range of motion.      Cervical back: Neck supple.  Skin:    General: Skin is warm and dry.  Neurological:     Mental Status: She is alert and oriented to person, place, and time.     GCS: GCS eye subscore is 4. GCS verbal subscore is 5. GCS motor subscore is 6.     Coordination: Coordination normal.     Comments: At this time patient's mental status is clear.  Speech is clear.  She follows commands without difficulty.  No localizing motor symptoms.  Subjective perception of some tingling of the tongue.    ED Results / Procedures / Treatments   Labs (all labs ordered are listed, but only abnormal results are displayed) Labs Reviewed  I-STAT CHEM 8, ED - Abnormal; Notable for the following components:      Result Value   Glucose, Bld 144 (*)    All other components within normal limits  RESP PANEL BY RT-PCR (FLU A&B, COVID) ARPGX2  PROTIME-INR  APTT  CBC  DIFFERENTIAL  ETHANOL  COMPREHENSIVE METABOLIC PANEL  RAPID URINE DRUG SCREEN, HOSP PERFORMED  URINALYSIS, ROUTINE W REFLEX MICROSCOPIC    EKG EKG Interpretation  Date/Time:  Monday November 13 2020 13:06:09 EDT Ventricular Rate:  50 PR Interval:  181 QRS Duration: 100 QT Interval:  469 QTC Calculation: 428 R Axis:   -43 Text Interpretation: Sinus rhythm Left axis deviation Low voltage, precordial leads Confirmed by Veryl Speak [phone]) on 11/15/2020 12:47:17 AM  Radiology CT HEAD CODE STROKE WO CONTRAST  Result Date: 11/13/2020 CLINICAL DATA:  Code stroke. In neuro deficit, acute, stroke suspected. Numbness and weakness in the left lower extremity for 2 days. Abnormal feeling in the right side of the body beginning 10 o'clock today. EXAM: CT HEAD WITHOUT CONTRAST TECHNIQUE: Contiguous axial images were obtained from the base of the  skull through the vertex without intravenous contrast. COMPARISON:  CT head without contrast 09/05/2020 and 12/17/1 FINDINGS: Brain: No acute infarct, hemorrhage, or mass lesion is present. No significant white matter  lesions are present. The ventricles are of normal size. Acute or focal cortical abnormality is present. The brainstem and cerebellum are within normal limits. Vascular: Minimal atherosclerotic calcifications are present the cavernous internal carotid arteries. No hyperdense vessel is present. Skull: Calvarium is intact. No focal lytic or blastic lesions are present. No significant extracranial soft tissue lesion is present. Sinuses/Orbits: The paranasal sinuses and mastoid air cells are clear. Bilateral lens replacements are noted. Globes and orbits are otherwise unremarkable. ASPECTS [facility] Stroke Program Early CT Score) - Ganglionic level infarction (caudate, lentiform nuclei, internal capsule, insula, M1-M3 cortex): 3/3 - Supraganglionic infarction (M4-M6 cortex): 7/7 Total score (0-10 with 10 being normal): 10/10 IMPRESSION: Negative CT of the head. Aspects 10/10 The above was relayed via text pager to Dr. Quinn Axe on 11/13/2020 at 12:46 . Electronically Signed   By: San Morelle M.D.   On: 11/13/2020 12:46    Procedures Procedures   Medications Ordered in ED Medications - No data to display  ED Course  I have reviewed the triage vital signs and the nursing notes.  Pertinent labs & imaging results that were available during my care of the patient were reviewed by me and considered in my medical decision making (see chart for details).  Clinical Course as of 11/17/20 1652  Mon Nov 13, 2020  1253 Neurology recommendation is for neurochecks every 30 minutes until 2:30 PM. If patient does not develop stroke symptoms or have decrease in neurologic exam, admit for TIA work-up. [MP]  1616 Consult: Admit to Dr. Roosevelt Locks [MP]    Clinical Course User Index [MP] Charlesetta Shanks, MD   MDM Rules/Calculators/A&P                          Patient with weakness and paresthesias for 2 days. Plan to admit for TIA work up. Alert with nonfocal exam at time of admission.Not TPA candidate, symptoms to mild  to treat at time of evaluation. Symptoms however have stuttering quality concerning for TIA . Final Clinical Impression(s) / ED Diagnoses Final diagnoses:  Numbness  Paresthesia    Rx / DC Orders ED Discharge Orders     None        Charlesetta Shanks, MD 11/17/20 1700

## 2020-11-13 NOTE — ED Provider Notes (Signed)
Emergency Medicine Provider Triage Evaluation Note  Sara Brown , a 80 y.o. female  was evaluated in triage.  Right side.  Clinically she has just started her since 10:00.  She has had difficulty describing the event but states that she feels like it is removed has been or difference in sensation from her left side.  She is also describing some difficulty walking.  She denies other neurologic complaints but does have some longstanding symptoms on her left side which are old.  She is not on any Belaya for any medications except for aspirin.  Review of Systems  Positive: Sensory change on the right Negative: Headache  Physical Exam  BP 134/75 (BP Location: Left Arm)   Pulse 61   Temp 98.9 F (37.2 C) (Oral)   Resp 17   SpO2 98%  Gen:   Awake, no distress   Resp:  Normal effort  MSK:   Moves extremities without difficulty  Other:    Medical Decision Making  Medically screening exam initiated at 12:21 PM.  Appropriate orders placed.  [facility] was informed that the remainder of the evaluation will be completed by another provider, this initial triage assessment does not replace that evaluation, and the importance of remaining in the ED until their evaluation is complete.  Stroke initiated   Margarita Mail, Hershal Coria 11/13/20 1227    Charlesetta Shanks, MD 11/17/20 1700

## 2020-11-13 NOTE — H&P (Signed)
History and Physical    Sara Brown UXN:[id number] DOB: 1940-09-10 DOA: 11/13/2020  PCP: Seward Carol, MD (Confirm with patient/family/NH records and if not entered, this has to be entered at [facility] point of entry) Patient coming from: Home  I have personally briefly reviewed patient's old medical records in [place]  Chief Complaint: right sided numbness and right leg weakness  HPI: Sara Brown is a 80 y.o. female with medical history significant of seizure, HTN, HLD, TIA, mild intermittent asthma, CAD, presented with new onset of right-sided numbness and weakness.  This morning around 10 AM, patient started to feel sudden onset of right-sided numbness, including right forehead right face and lip and right-sided arm and leg and also feels clumsy and heaviness of the right leg.  She said that she has been having episodes of left-sided numbness in March but she never had similar numbness or weakness on the right side.  Denies any headache, no blurry vision.  In March this year patient developed left-sided numbness and weakness came to hospital hospital, MRI was negative.  EEG found patient had a left temporal intermittent polymorphic delta slowing, and patient was started on Keppra and suspension of driver license for 6 months.  ED Course: Code stroke called, NIHSS= 1, consider not candidate for tPA.  Review of Systems: As per HPI otherwise 14 point review of systems negative.    Past Medical History:  Diagnosis Date  . Allergic rhinitis   . Arthritis    "fingers, knees" (02/09/2018)  . Asthma    "stopped taking RX after dr said I don't have this" (02/09/2018)  . Carotid stenosis    ICA(L)  . Complication of anesthesia    "felt the cut w/one of my c-sections" (02/09/2018)  . Constipation   . GERD (gastroesophageal reflux disease)   . Headache    "years ago; before menopause" (02/09/2018)  . Hyperlipidemia   . Hypertension   . Neuropathy    face (R)  . OA  (osteoarthritis)   . Osteoporosis   . TIA (transient ischemic attack) ?2005  . Type II diabetes mellitus ([place])   . Vitamin D deficiency     Past Surgical History:  Procedure Laterality Date  . CATARACT EXTRACTION W/ INTRAOCULAR LENS  IMPLANT, BILATERAL Bilateral   . CESAREAN SECTION  1971; 1975  . INTRAVASCULAR PRESSURE WIRE/FFR STUDY N/A 04/01/2018   Procedure: INTRAVASCULAR PRESSURE WIRE/FFR STUDY;  Surgeon: Nigel Mormon, MD;  Location: [place] [facility];  Service: Cardiovascular;  Laterality: N/A;  . LEFT HEART CATH AND CORONARY ANGIOGRAPHY N/A 04/01/2018   Procedure: LEFT HEART CATH AND CORONARY ANGIOGRAPHY;  Surgeon: Nigel Mormon, MD;  Location: [place] [facility];  Service: Cardiovascular;  Laterality: N/A;  . TUBAL LIGATION  1975     reports that she has never smoked. She has never used smokeless tobacco. She reports that she does not drink alcohol and does not use drugs.  Allergies  Allergen Reactions  . Naproxen Other (See Comments)    Patient preference  Other reaction(s): upset stomach  . Prednisone Other (See Comments)    Patient Preference  Other reaction(s): upset stomach  . Amlodipine Hives  . Losartan Rash    Family History  Problem Relation Age of Onset  . Diabetes Mellitus II Mother   . Osteoporosis Mother   . Hypertension Mother   . Diabetes Mellitus II Father   . CVA Sister        4  . CAD  Sister        4  . Hypertension Sister        4  . Osteoarthritis Sister        4  . Alcoholism Brother   . Breast cancer Cousin 75     Prior to Admission medications   Medication Sig Start Date End Date Taking? Authorizing Provider  ADVAIR DISKUS 100-50 MCG/DOSE AEPB Inhale 1 puff into the lungs 2 (two) times daily. 07/26/20  Yes [provider]  albuterol (PROVENTIL HFA;VENTOLIN HFA) 108 (90 Base) MCG/ACT inhaler Inhale 1 puff into the lungs every 4 (four) hours as needed for wheezing. 07/23/18  Yes [provider]   alendronate (FOSAMAX) 70 MG tablet Take 70 mg by mouth every Saturday. 08/09/18  Yes [provider]  aspirin EC 81 MG tablet Take 81 mg by mouth daily.   Yes [provider]  Calcium Citrate-Vitamin D 315-250 MG-UNIT TABS Take 2 tablets by mouth daily.   Yes [provider]  carboxymethylcellulose (REFRESH PLUS) 0.5 % SOLN Place 1 drop into both eyes in the morning and at bedtime.   Yes [provider]  Cholecalciferol (VITAMIN D3) 10 MCG (400 UNIT) CAPS Take 800 Units by mouth daily.   Yes [provider]  Clobetasol Prop Emollient Base (CLOBETASOL PROPIONATE E) 0.05 % emollient cream Apply 1 application topically 2 (two) times daily as needed. Patient taking differently: Apply 1 application topically 2 (two) times daily as needed (rash under breast). 12/08/19  Yes Lavonna Monarch, MD  cloNIDine (CATAPRES) 0.1 MG tablet Take 0.1 mg by mouth See admin instructions. Take 1 tablet by mouth every night at bedtime. May take additional 1 tablet if blood pressure is high 10/01/17  Yes [provider]  Coenzyme Q10 (COQ10) 200 MG CAPS Take 200 mg by mouth daily.   Yes [provider]  cycloSPORINE (RESTASIS) 0.05 % ophthalmic emulsion Place 1 drop into both eyes 2 (two) times daily.    Yes [provider]  fluticasone (FLONASE) 50 MCG/ACT nasal spray Place 1 spray into both nostrils in the morning and at bedtime.   Yes [provider]  Garlic 2440 MG CAPS Take 1,000 mg by mouth daily as needed (high blood pressure).   Yes [provider]  hydrALAZINE (APRESOLINE) 100 MG tablet Take 100 mg by mouth 3 (three) times daily. 10/22/20  Yes [provider]  isosorbide dinitrate (ISORDIL) 20 MG tablet TAKE 1 TABLET BY MOUTH THREE TIMES A DAY 09/27/20  Yes Patwardhan, Manish J, MD  levETIRAcetam (KEPPRA) 500 MG tablet Take 1 tablet (500 mg total) by mouth every 12 (twelve) hours. 10/12/20  Yes Star Age, MD  Magnesium 250  MG TABS Take 250 mg by mouth daily.   Yes [provider]  Multiple Vitamins-Minerals (MULTIVITAMIN WITH MINERALS) tablet Take 1 tablet by mouth daily. Centrum for Women   Yes [provider]  omeprazole (PRILOSEC OTC) 20 MG tablet Take 20 mg by mouth daily as needed (for reflux).    Yes [provider]  Pitavastatin Calcium (LIVALO) 1 MG TABS Take 1 mg by mouth daily.   Yes [provider]  polyethylene glycol (MIRALAX / GLYCOLAX) packet Take 17 g by mouth daily as needed for moderate constipation.   Yes [provider]  pyridOXINE (VITAMIN B-6) 100 MG tablet Take 100 mg by mouth daily.    Yes [provider]  spironolactone (ALDACTONE) 50 MG tablet Take 50 mg by mouth daily.  Yes [provider]  triamcinolone cream (KENALOG) 0.1 % Apply 1 application topically 2 (two) times daily as needed (rash/ eczema).  11/01/14  Yes [provider]  hydrALAZINE (APRESOLINE) 50 MG tablet TAKE 1 TABLET BY MOUTH THREE TIMES A DAY Patient not taking: No sig reported 10/23/20   Nigel Mormon, MD    Physical Exam: Vitals:   11/13/20 1500 11/13/20 1515 11/13/20 1530 11/13/20 1622  BP: 135/77 (!) 154/85 (!) 147/77 (!) 151/80  Pulse: (!) 57 68 (!) 58 (!) 56  Resp: 19 20 (!) 21 18  Temp:      TempSrc:      SpO2: 99% 94% 98% 98%  Weight:        Constitutional: NAD, calm, comfortable Vitals:   11/13/20 1500 11/13/20 1515 11/13/20 1530 11/13/20 1622  BP: 135/77 (!) 154/85 (!) 147/77 (!) 151/80  Pulse: (!) 57 68 (!) 58 (!) 56  Resp: 19 20 (!) 21 18  Temp:      TempSrc:      SpO2: 99% 94% 98% 98%  Weight:       Eyes: PERRL, lids and conjunctivae normal ENMT: Mucous membranes are moist. Posterior pharynx clear of any exudate or lesions.Normal dentition.  Neck: normal, supple, no masses, no thyromegaly Respiratory: clear to auscultation bilaterally, no wheezing, no crackles. Normal respiratory effort. No accessory muscle use.   Cardiovascular: Regular rate and rhythm, no murmurs / rubs / gallops. No extremity edema. 2+ pedal pulses. No carotid bruits.  Abdomen: no tenderness, no masses palpated. No hepatosplenomegaly. Bowel sounds positive.  Musculoskeletal: no clubbing / cyanosis. No joint deformity upper and lower extremities. Good ROM, no contractures. Normal muscle tone.  Skin: no rashes, lesions, ulcers. No induration Neurologic: CN 2-12 grossly intact. Sensation intact, DTR normal. Strength 5/5 in all 4.  Psychiatric: Normal judgment and insight. Alert and oriented x 3. Normal mood.     Labs on Admission: I have personally reviewed following labs and imaging studies  CBC: Recent Labs  Lab 11/13/20 1226 11/13/20 1233  WBC 4.5  --   NEUTROABS 3.0  --   HGB 12.3 13.3  HCT 37.3 39.0  MCV 90.3  --   PLT 236  --    Basic Metabolic Panel: Recent Labs  Lab 11/13/20 1226 11/13/20 1233  NA 135 139  K 3.8 4.0  CL 102 102  CO2 25  --   GLUCOSE 149* 144*  BUN 15 16  CREATININE 0.82 0.80  CALCIUM 10.0  --    GFR: Estimated Creatinine Clearance: 52.7 mL/min (by C-G formula based on SCr of 0.8 mg/dL). Liver Function Tests: Recent Labs  Lab 11/13/20 1226  AST 29  ALT 32  ALKPHOS 53  BILITOT 0.5  PROT 7.3  ALBUMIN 3.9   No results for input(s): LIPASE, AMYLASE in the last 168 hours. No results for input(s): AMMONIA in the last 168 hours. Coagulation Profile: Recent Labs  Lab 11/13/20 1226  INR 1.1   Cardiac Enzymes: No results for input(s): CKTOTAL, CKMB, CKMBINDEX, TROPONINI in the last 168 hours. BNP (last 3 results) No results for input(s): PROBNP in the last 8760 hours. HbA1C: No results for input(s): HGBA1C in the last 72 hours. CBG: No results for input(s): GLUCAP in the last 168 hours. Lipid Profile: No results for input(s): CHOL, HDL, LDLCALC, TRIG, CHOLHDL, LDLDIRECT in the last 72 hours. Thyroid Function Tests: No results for input(s): TSH, T4TOTAL, FREET4, T3FREE,  THYROIDAB in the last 72 hours. Anemia Panel: No  results for input(s): VITAMINB12, FOLATE, FERRITIN, TIBC, IRON, RETICCTPCT in the last 72 hours. Urine analysis:    Component Value Date/Time   COLORURINE STRAW (A) 11/13/2020 1326   APPEARANCEUR CLEAR 11/13/2020 1326   LABSPEC 1.005 11/13/2020 1326   PHURINE 9.0 (H) 11/13/2020 1326   GLUCOSEU NEGATIVE 11/13/2020 1326   HGBUR NEGATIVE 11/13/2020 1326   BILIRUBINUR NEGATIVE 11/13/2020 1326   [place] 11/13/2020 1326   PROTEINUR NEGATIVE 11/13/2020 1326   UROBILINOGEN 0.2 11/27/2014 0311   NITRITE NEGATIVE 11/13/2020 1326   [place] 11/13/2020 1326    Radiological Exams on Admission: CT HEAD CODE STROKE WO CONTRAST  Result Date: 11/13/2020 CLINICAL DATA:  Code stroke. In neuro deficit, acute, stroke suspected. Numbness and weakness in the left lower extremity for 2 days. Abnormal feeling in the right side of the body beginning 10 o'clock today. EXAM: CT HEAD WITHOUT CONTRAST TECHNIQUE: Contiguous axial images were obtained from the base of the skull through the vertex without intravenous contrast. COMPARISON:  CT head without contrast 09/05/2020 and 12/17/1 FINDINGS: Brain: No acute infarct, hemorrhage, or mass lesion is present. No significant white matter lesions are present. The ventricles are of normal size. Acute or focal cortical abnormality is present. The brainstem and cerebellum are within normal limits. Vascular: Minimal atherosclerotic calcifications are present the cavernous internal carotid arteries. No hyperdense vessel is present. Skull: Calvarium is intact. No focal lytic or blastic lesions are present. No significant extracranial soft tissue lesion is present. Sinuses/Orbits: The paranasal sinuses and mastoid air cells are clear. Bilateral lens replacements are noted. Globes and orbits are otherwise unremarkable. ASPECTS [facility] Stroke Program Early CT Score) - Ganglionic level infarction (caudate, lentiform  nuclei, internal capsule, insula, M1-M3 cortex): 3/3 - Supraganglionic infarction (M4-M6 cortex): 7/7 Total score (0-10 with 10 being normal): 10/10 IMPRESSION: Negative CT of the head. Aspects 10/10 The above was relayed via text pager to Dr. Quinn Axe on 11/13/2020 at 12:46 . Electronically Signed   By: San Morelle M.D.   On: 11/13/2020 12:46    EKG: Independently reviewed.  Sinus bradycardia, no acute ST changes.  Assessment/Plan Active Problems:   TIA (transient ischemic attack)  (please populate well all problems here in Problem List. (For example, if patient is on BP meds at home and you resume or decide to hold them, it is a problem that needs to be her. Same for CAD, COPD, HLD and so on)  Right sided paresthesia -Probably TIA.  Order MRI, echocardiogram was done 39-month ago will not repeat this time.  Telemetry monitoring x24 hours, PT OT evaluation, likely can be discharged home tomorrow. -Continue aspirin, she is not on statin, check lipid panel -Allow permissive hypertension.  HTN -Allow permissive hypertension, consider discontinue clonidine due to bradycardia.  Bradycardia -Consider wean off Clonidine.  IIDM -A1C=6.4 two months ago, fairly controlled.  Hx of left carotic stenosis -Recent MRI showed less than 50% stenosis bilaterally.  Seizure -Today symptoms not compatible with seizure episode, will continue home dose of Keppra.  DVT prophylaxis: Lovenox Code Status: FulL code Family Communication: None at bedside Disposition Plan: Expect less than 2 midnight hospital stay Consults called: Neurology Admission status: Tele obs   Lequita Halt MD [facility] Pager [phone]  11/13/2020, 5:13 PM

## 2020-11-13 NOTE — Consult Note (Addendum)
NEUROLOGY CONSULTATION NOTE   Date of service: November 13, 2020 Patient Name: Sara Brown MRN:  [id number] DOB:  [birth year] Reason for consult: acute onset R sided sensory impairment _ _ _   _ __   _ __ _ _  __ __   _ __   __ _  History of Present Illness   Sara Brown is a 80 y.o. female with PMH significant for  has a past medical history of Allergic rhinitis, Arthritis, Asthma, Carotid stenosis, Complication of anesthesia, Constipation, GERD (gastroesophageal reflux disease), Headache, Hyperlipidemia, Hypertension, Neuropathy, OA (osteoarthritis), Osteoporosis, TIA (transient ischemic attack) (?2005), Type II diabetes mellitus ([place]), and Vitamin D deficiency.   This is an 80 year old woman with a history of hypertension, diabetes, hyperlipidemia who presented to the emergency department by private vehicle today complaining of right-sided numbness.  She woke up at her normal baseline and then just after 10 AM she developed right-sided numbness and paresthesias (right leg and right arm) that has persisted.  Stroke code was activated.  NIHSS equaled 1 for sensory impairment. CTH WNL. tPA not administered 2/2 v mild sx. She was observed closely in ED until she was outside tPA window and she did not worsen during that time and was then admitted to hospitalist service for stroke w/u. CTA H&N not performed during stroke code bc exam not c/w LVO.  CNS imaging personally reviewed.  She previously presented to ED on 09/04/20 with L sided paresthesias (face and leg). W/u that admission:  CTH: NAICP  MRI brain MRA H&N 1. No evidence of acute intracranial abnormality.  No acute infarct. 2. No emergent large vessel occlusion or evidence of proximal hemodynamically significant stenosis in the head or neck. 3. In comparison to 1572, similar dolichoectatic left vertebral artery.  EEG 09/05/20 Focal slowing in L temporal region, no IID, no sz  She was started on LEV that admission 2/2  c/f possible seizure. She is currently taking LEV 500mg  bid and reports compliance.     ROS   10 point review of systems was performed and was negative except as described in HPI.  Past History   Past Medical History:  Diagnosis Date  . Allergic rhinitis   . Arthritis    "fingers, knees" (02/09/2018)  . Asthma    "stopped taking RX after dr said I don't have this" (02/09/2018)  . Carotid stenosis    ICA(L)  . Complication of anesthesia    "felt the cut w/one of my c-sections" (02/09/2018)  . Constipation   . GERD (gastroesophageal reflux disease)   . Headache    "years ago; before menopause" (02/09/2018)  . Hyperlipidemia   . Hypertension   . Neuropathy    face (R)  . OA (osteoarthritis)   . Osteoporosis   . TIA (transient ischemic attack) ?2005  . Type II diabetes mellitus ([place])   . Vitamin D deficiency    Past Surgical History:  Procedure Laterality Date  . CATARACT EXTRACTION W/ INTRAOCULAR LENS  IMPLANT, BILATERAL Bilateral   . CESAREAN SECTION  1971; 1975  . INTRAVASCULAR PRESSURE WIRE/FFR STUDY N/A 04/01/2018   Procedure: INTRAVASCULAR PRESSURE WIRE/FFR STUDY;  Surgeon: Nigel Mormon, MD;  Location: [place] [facility] LAB;  Service: Cardiovascular;  Laterality: N/A;  . LEFT HEART CATH AND CORONARY ANGIOGRAPHY N/A 04/01/2018   Procedure: LEFT HEART CATH AND CORONARY ANGIOGRAPHY;  Surgeon: Nigel Mormon, MD;  Location: [place] [facility] LAB;  Service: Cardiovascular;  Laterality: N/A;  . TUBAL LIGATION  1975   Family History  Problem Relation Age of Onset  . Diabetes Mellitus II Mother   . Osteoporosis Mother   . Hypertension Mother   . Diabetes Mellitus II Father   . CVA Sister        4  . CAD Sister        4  . Hypertension Sister        4  . Osteoarthritis Sister        4  . Alcoholism Brother   . Breast cancer Cousin 31   Social History   Socioeconomic History  . Marital status: Married    Spouse name: Sara Brown  . Number of children: 2  . Years  of education: 16  . Highest education level: Bachelor's degree (e.g., BA, AB, BS)  Occupational History    Comment: employed at home  Tobacco Use  . Smoking status: Never Smoker  . Smokeless tobacco: Never Used  Vaping Use  . Vaping Use: Never used  Substance and Sexual Activity  . Alcohol use: Never  . Drug use: Never  . Sexual activity: Not Currently  Other Topics Concern  . Not on file  Social History Narrative   Consumes no caffeine   Social Determinants of Health   Financial Resource Strain: Not on file  Food Insecurity: Not on file  Transportation Needs: Not on file  Physical Activity: Not on file  Stress: Not on file  Social Connections: Not on file   Allergies  Allergen Reactions  . Naproxen Other (See Comments)    Patient preference  Other reaction(s): upset stomach  . Prednisone Other (See Comments)    Patient Preference  Other reaction(s): upset stomach  . Amlodipine Hives  . Losartan Rash    Medications   (Not in a hospital admission)    Vitals   Vitals:   11/13/20 1800 11/13/20 1815 11/13/20 1830 11/13/20 1845  BP: (!) 161/79 (!) 153/72 (!) 149/85 136/77  Pulse: (!) 51 (!) 50 (!) 55 (!) 50  Resp: (!) 23 16 17 17   Temp:      TempSrc:      SpO2: 99% 99% 100% 98%  Weight:         Body mass index is 21.38 kg/m.  Physical Exam   Physical Exam Gen: A&O x4, NAD HEENT: Atraumatic, normocephalic;mucous membranes moist; oropharynx clear, tongue without atrophy or fasciculations. Neck: Supple, trachea midline. Resp: CTAB, no w/r/r CV: RRR, no m/g/r; nml S1 and S2. 2+ symmetric peripheral pulses. Abd: soft/NT/ND; nabs x 4 quad Extrem: Nml bulk; no cyanosis, clubbing, or edema.  Neuro: *MS: A&O x4. Follows multi-step commands.  *Speech: fluid, nondysarthric, able to name and repeat *CN:    I: Deferred   II,III: PERRLA, VFF by confrontation, optic discs sharp   III,IV,VI: EOMI w/o nystagmus, no ptosis   V: Sensation intact from V1 to V3 to  LT   VII: Eyelid closure was full.  Smile symmetric.   VIII: Hearing intact to voice   IX,X: Voice normal, palate elevates symmetrically    XI: SCM/trap 5/5 bilat   XII: Tongue protrudes midline, no atrophy or fasciculations   *Motor:   Normal bulk.  No tremor, rigidity or bradykinesia. No pronator drift.    Strength: Dlt Bic Tri WrE WrF FgS Gr HF KnF KnE PlF DoF    Left 5 5 5 5 5 5 5 5 5 5 5 5     Right 5 5 5 5 5 5 5 5  5  5 5 5     *Sensory: Sensory impairment to LT on RUE and RLE. No double-simultaneous extinction.  *Coordination:  Finger-to-nose, heel-to-shin, rapid alternating motions were intact. *Reflexes:  2+ and symmetric throughout without clonus; toes down-going bilat *Gait: deferred  NIHSS = 1 for sensory impairment   Premorbid mRS = 3   Labs   CBC:  Recent Labs  Lab 11/13/20 1226 11/13/20 1233  WBC 4.5  --   NEUTROABS 3.0  --   HGB 12.3 13.3  HCT 37.3 39.0  MCV 90.3  --   PLT 236  --     Basic Metabolic Panel:  Lab Results  Component Value Date   NA 139 11/13/2020   K 4.0 11/13/2020   CO2 25 11/13/2020   GLUCOSE 144 (H) 11/13/2020   BUN 16 11/13/2020   CREATININE 0.80 11/13/2020   CALCIUM 10.0 11/13/2020   GFRNONAA >60 11/13/2020   GFRAA >60 10/11/2019   Lipid Panel:  Lab Results  Component Value Date   LDLCALC 114 (H) 02/10/2018   HgbA1c:  Lab Results  Component Value Date   HGBA1C 6.4 (H) 09/05/2020   Urine Drug Screen:     Component Value Date/Time   LABOPIA NONE DETECTED 11/13/2020 1325   COCAINSCRNUR NONE DETECTED 11/13/2020 1325   LABBENZ NONE DETECTED 11/13/2020 1325   AMPHETMU NONE DETECTED 11/13/2020 1325   THCU NONE DETECTED 11/13/2020 1325   LABBARB NONE DETECTED 11/13/2020 1325    Alcohol Level     Component Value Date/Time   ETH <10 11/13/2020 1226     Impression   This is an 80 year old woman with a history of hypertension, diabetes, hyperlipidemia who presented to the emergency department by private vehicle  today complaining of right-sided numbness c/f possible TIA vs stroke.  Recommendations   - Admit to hospitalist service for stroke w/u; stroke team will consult - Permissive HTN x48 hrs from sx onset or until stroke ruled out by MRI goal BP <220/110. PRN labetalol or hydralazine if BP above these parameters. Avoid oral antihypertensives. - MRI brain wo contrast - MRA H&N and TTE performed within past 3 mos, will not repeat now - Continue ASA 81mg  daily - Check A1c and LDL + add statin per guidelines - q4 hr neuro checks - STAT head CT for any change in neuro exam - Tele - PT/OT/SLP if indicated - Stroke education - Continue LEV 500mg  bid - EEG not ordered this admission bc sx not favored to be 2/2 seizure and would not change mgmt (AED mgmt deferred to outpatient neurologist) - F/u with established neurologist upon hospital discharge  Stroke team will f/u on MRI results in AM   ______________________________________________________________________   Thank you for the opportunity to take part in the care of this patient. If you have any further questions, please contact the neurology consultation attending.  Signed,  Su Monks, MD [facility] [phone] If 7pm- 7am, please page neurology on call as listed in [place].

## 2020-11-14 ENCOUNTER — Other Ambulatory Visit: Payer: Self-pay

## 2020-11-14 ENCOUNTER — Encounter (HOSPITAL_COMMUNITY): Payer: Self-pay | Admitting: Internal Medicine

## 2020-11-14 ENCOUNTER — Observation Stay (HOSPITAL_COMMUNITY): Payer: Medicare Other

## 2020-11-14 ENCOUNTER — Observation Stay (HOSPITAL_BASED_OUTPATIENT_CLINIC_OR_DEPARTMENT_OTHER)
Admit: 2020-11-14 | Discharge: 2020-11-14 | Disposition: A | Discharge status: Home / Self Care | Payer: Medicare Other | Attending: Neurology | Admitting: Neurology

## 2020-11-14 ENCOUNTER — Observation Stay (HOSPITAL_COMMUNITY)
Admit: 2020-11-14 | Discharge: 2020-11-14 | Disposition: A | Discharge status: Home / Self Care | Payer: Medicare Other | Attending: Nurse Practitioner | Admitting: Nurse Practitioner

## 2020-11-14 DIAGNOSIS — I10 Essential (primary) hypertension: Secondary | ICD-10-CM | POA: Diagnosis present

## 2020-11-14 DIAGNOSIS — G40109 Localization-related (focal) (partial) symptomatic epilepsy and epileptic syndromes with simple partial seizures, not intractable, without status epilepticus: Secondary | ICD-10-CM | POA: Diagnosis present

## 2020-11-14 DIAGNOSIS — E559 Vitamin D deficiency, unspecified: Secondary | ICD-10-CM | POA: Diagnosis present

## 2020-11-14 DIAGNOSIS — M810 Age-related osteoporosis without current pathological fracture: Secondary | ICD-10-CM | POA: Diagnosis present

## 2020-11-14 DIAGNOSIS — I639 Cerebral infarction, unspecified: Secondary | ICD-10-CM | POA: Diagnosis not present

## 2020-11-14 DIAGNOSIS — G9389 Other specified disorders of brain: Secondary | ICD-10-CM | POA: Diagnosis not present

## 2020-11-14 DIAGNOSIS — Z886 Allergy status to analgesic agent status: Secondary | ICD-10-CM | POA: Diagnosis not present

## 2020-11-14 DIAGNOSIS — J309 Allergic rhinitis, unspecified: Secondary | ICD-10-CM | POA: Diagnosis present

## 2020-11-14 DIAGNOSIS — Z888 Allergy status to other drugs, medicaments and biological substances status: Secondary | ICD-10-CM | POA: Diagnosis not present

## 2020-11-14 DIAGNOSIS — Z833 Family history of diabetes mellitus: Secondary | ICD-10-CM | POA: Diagnosis not present

## 2020-11-14 DIAGNOSIS — I2510 Atherosclerotic heart disease of native coronary artery without angina pectoris: Secondary | ICD-10-CM | POA: Diagnosis present

## 2020-11-14 DIAGNOSIS — E1140 Type 2 diabetes mellitus with diabetic neuropathy, unspecified: Secondary | ICD-10-CM | POA: Diagnosis present

## 2020-11-14 DIAGNOSIS — E118 Type 2 diabetes mellitus with unspecified complications: Secondary | ICD-10-CM | POA: Diagnosis not present

## 2020-11-14 DIAGNOSIS — G8191 Hemiplegia, unspecified affecting right dominant side: Secondary | ICD-10-CM | POA: Diagnosis present

## 2020-11-14 DIAGNOSIS — R29701 NIHSS score 1: Secondary | ICD-10-CM | POA: Diagnosis present

## 2020-11-14 DIAGNOSIS — M7989 Other specified soft tissue disorders: Secondary | ICD-10-CM

## 2020-11-14 DIAGNOSIS — Z7982 Long term (current) use of aspirin: Secondary | ICD-10-CM | POA: Diagnosis not present

## 2020-11-14 DIAGNOSIS — K5909 Other constipation: Secondary | ICD-10-CM | POA: Diagnosis present

## 2020-11-14 DIAGNOSIS — Z20822 Contact with and (suspected) exposure to covid-19: Secondary | ICD-10-CM | POA: Diagnosis present

## 2020-11-14 DIAGNOSIS — G459 Transient cerebral ischemic attack, unspecified: Secondary | ICD-10-CM | POA: Diagnosis present

## 2020-11-14 DIAGNOSIS — Z7983 Long term (current) use of bisphosphonates: Secondary | ICD-10-CM | POA: Diagnosis not present

## 2020-11-14 DIAGNOSIS — Z79899 Other long term (current) drug therapy: Secondary | ICD-10-CM | POA: Diagnosis not present

## 2020-11-14 DIAGNOSIS — K219 Gastro-esophageal reflux disease without esophagitis: Secondary | ICD-10-CM | POA: Diagnosis present

## 2020-11-14 DIAGNOSIS — R001 Bradycardia, unspecified: Secondary | ICD-10-CM | POA: Diagnosis not present

## 2020-11-14 DIAGNOSIS — E785 Hyperlipidemia, unspecified: Secondary | ICD-10-CM | POA: Diagnosis present

## 2020-11-14 DIAGNOSIS — M1990 Unspecified osteoarthritis, unspecified site: Secondary | ICD-10-CM | POA: Diagnosis present

## 2020-11-14 DIAGNOSIS — Z8262 Family history of osteoporosis: Secondary | ICD-10-CM | POA: Diagnosis not present

## 2020-11-14 DIAGNOSIS — Z8249 Family history of ischemic heart disease and other diseases of the circulatory system: Secondary | ICD-10-CM | POA: Diagnosis not present

## 2020-11-14 LAB — HEMOGLOBIN A1C
Hgb A1c MFr Bld: 5.9 % — ABNORMAL HIGH (ref 4.8–5.6)
Mean Plasma Glucose: 123 mg/dL

## 2020-11-14 LAB — LIPID PANEL
Cholesterol: 180 mg/dL (ref 0–200)
HDL: 62 mg/dL (ref >40)
LDL Cholesterol: 101 mg/dL — ABNORMAL HIGH (ref 0–99)
Total CHOL/HDL Ratio: 2.9 RATIO
Triglycerides: 85 mg/dL (ref <150)
VLDL: 17 mg/dL (ref 0–40)

## 2020-11-14 MED ORDER — CLOPIDOGREL BISULFATE 75 MG PO TABS
75.0000 mg | ORAL_TABLET | Freq: Every day | ORAL | Status: DC
  Administered 2020-11-14 – 2020-11-15 (×2): 75 mg via ORAL
  Filled 2020-11-14 (×2): qty 1

## 2020-11-14 NOTE — ED Notes (Signed)
Hooked patient back up to the monitor patient is resting with call bell in reach 

## 2020-11-14 NOTE — Progress Notes (Signed)
Carotid artery duplex and bilateral lower extremity venous duplex completed. Refer to "CV Proc" under chart review to view preliminary results.  11/14/2020 11:27 AM Kelby Aline., MHA, RVT, RDCS, RDMS

## 2020-11-14 NOTE — Progress Notes (Signed)
Patient arrived to unit via stretcher. Transferred to hospital bed with only SBA. She is a&o x4. Respirations even and unlabored. No s/s of distress. VSS. Patient is able to make needs known. Education provided to unit, bed buttons, and call light. Ambulated to bathroom with sba. Skin is intact. Patient placed on bedside monitor, tele monitor notified. She denies any pain or discomfort at this time. She is able to make needs known. Will continue to monitor. Call light and possessions in reach.

## 2020-11-14 NOTE — Progress Notes (Signed)
Attempted EEG. Pt will be going to MRI in a few. Will attempt later when our schedule permits

## 2020-11-14 NOTE — ED Notes (Signed)
Pt is at MRI

## 2020-11-14 NOTE — Evaluation (Signed)
Physical Therapy Evaluation Patient Details Name: Sara Brown MRN: [id number] DOB: 1940-06-15 Today's Date: 11/14/2020   History of Present Illness  Sara Brown is a 80 y.o. female came to ED on 11/13/20 with R sided numbness paresthesias (right leg and right arm) that has persisted. MRI pending PMH includes: Allergic rhinitis, Arthritis, Asthma, Carotid stenosis, Complication of anesthesia, Constipation, Headache, Hyperlipidemia, Hypertension, Neuropathy, OA , Osteoporosis, TIA (2005), Type II diabetes mellitus.  Clinical Impression  Patient evaluated by Physical Therapy with no further acute PT needs identified. All education has been completed and the patient has no further questions. Pt complaining of R ankle pain and "funny" feeling in calf that was difficult for pt to describe. Did not seem to affect mobility tolerance. No overt LOB noted. Overall at a supervision to mod I level. Reports husband can assist at home. See below for any follow-up Physical Therapy or equipment needs. PT is signing off. Thank you for this referral. If needs change, please re-consult.      Follow Up Recommendations No PT follow up    Equipment Recommendations  None recommended by PT    Recommendations for Other Services       Precautions / Restrictions Precautions Precautions: Fall Restrictions Weight Bearing Restrictions: No      Mobility  Bed Mobility Overal bed mobility: Modified Independent                  Transfers Overall transfer level: Modified independent                  Ambulation/Gait Ambulation/Gait assistance: Supervision Gait Distance (Feet): 200 Feet Assistive device: None Gait Pattern/deviations: Step-through pattern;Decreased stride length Gait velocity: Decreased   General Gait Details: Supervision for safety. Pt reporting "funny" feeling in R calf that got worse with longer distance ambulation. Did not seem to affect mobility  tolerance.  Stairs            Wheelchair Mobility    Modified Rankin (Stroke Patients Only) Modified Rankin (Stroke Patients Only) Pre-Morbid Rankin Score: No symptoms Modified Rankin: No symptoms     Balance Overall balance assessment: No apparent balance deficits (not formally assessed)                                           Pertinent Vitals/Pain Pain Assessment: Faces Faces Pain Scale: Hurts little more Pain Location: R medial ankle, R calf Pain Descriptors / Indicators: Guarding;Grimacing Pain Intervention(s): Limited activity within patient's tolerance;Monitored during session;Repositioned    Home Living Family/patient expects to be discharged to:: Private residence Living Arrangements: Spouse/significant other Available Help at Discharge: Family;Available 24 hours/day Type of Home: House Home Access: Level entry     Home Layout: One level Home Equipment: Shower seat      Prior Function Level of Independence: Independent               Hand Dominance   Dominant Hand: Right    Extremity/Trunk Assessment   Upper Extremity Assessment Upper Extremity Assessment: Overall WFL for tasks assessed    Lower Extremity Assessment Lower Extremity Assessment: RLE deficits/detail RLE Deficits / Details: Reporting medial R ankle pain and "funny" feeling in R calf that was difficult to describe.    Cervical / Trunk Assessment Cervical / Trunk Assessment: Normal  Communication   Communication: No difficulties  Cognition Arousal/Alertness: Awake/alert Behavior During Therapy: [facility]  for tasks assessed/performed Overall Cognitive Status: Within Functional Limits for tasks assessed                                        General Comments      Exercises     Assessment/Plan    PT Assessment Patent does not need any further PT services  PT Problem List         PT Treatment Interventions      PT Goals (Current  goals can be found in the Care Plan section)  Acute Rehab PT Goals Patient Stated Goal: to go home PT Goal Formulation: With patient Time For Goal Achievement: 11/28/20 Potential to Achieve Goals: Good    Frequency     Barriers to discharge        Co-evaluation               AM-PAC PT "6 Clicks" Mobility  Outcome Measure Help needed turning from your back to your side while in a flat bed without using bedrails?: None Help needed moving from lying on your back to sitting on the side of a flat bed without using bedrails?: None Help needed moving to and from a bed to a chair (including a wheelchair)?: None Help needed standing up from a chair using your arms (e.g., wheelchair or bedside chair)?: None Help needed to walk in hospital room?: None Help needed climbing 3-5 steps with a railing? : A Little 6 Click Score: 23    End of Session Equipment Utilized During Treatment: Gait belt Activity Tolerance: Patient tolerated treatment well Patient left: in bed;with call bell/phone within reach (on stretcher in ED) Nurse Communication: Mobility status PT Visit Diagnosis: Other symptoms and signs involving the nervous system (K59.977)    Time: 4142-3953 PT Time Calculation (min) (ACUTE ONLY): 16 min   Charges:   PT Evaluation $PT Eval Low Complexity: 1 Low          Lou Miner, DPT  Acute Rehabilitation Services  Pager: [phone] Office: [phone]   Rudean Hitt 11/14/2020, 10:14 AM

## 2020-11-14 NOTE — Progress Notes (Addendum)
STROKE TEAM PROGRESS NOTE   INTERVAL HISTORY Still in ER awaiting a bed upstairs. She reports improved right side sensation. Stroke/TIA work up underway. She also c/o pain, tenderness in both calfs that limited her ambulation. Will check Bilat [place] of LEs to r/o DVTs.  Vitals:   11/14/20 1140 11/14/20 1143 11/14/20 1330 11/14/20 1345  BP: 127/76  135/78   Pulse: (!) 48 (!) 51 (!) 51 65  Resp: 18  19 14   Temp:      TempSrc:      SpO2: 100% 100% 100% 100%  Weight:       CBC:  Recent Labs  Lab 11/13/20 1226 11/13/20 1233  WBC 4.5  --   NEUTROABS 3.0  --   HGB 12.3 13.3  HCT 37.3 39.0  MCV 90.3  --   PLT 236  --    Basic Metabolic Panel:  Recent Labs  Lab 11/13/20 1226 11/13/20 1233  NA 135 139  K 3.8 4.0  CL 102 102  CO2 25  --   GLUCOSE 149* 144*  BUN 15 16  CREATININE 0.82 0.80  CALCIUM 10.0  --    Lipid Panel:  Recent Labs  Lab 11/14/20 0501  CHOL 180  TRIG 85  HDL 62  CHOLHDL 2.9  VLDL 17  LDLCALC 101*   HgbA1c: No results for input(s): HGBA1C in the last 168 hours. Urine Drug Screen:  Recent Labs  Lab 11/13/20 1325  LABOPIA NONE DETECTED  COCAINSCRNUR NONE DETECTED  LABBENZ NONE DETECTED  AMPHETMU NONE DETECTED  THCU NONE DETECTED  LABBARB NONE DETECTED    Alcohol Level  Recent Labs  Lab 11/13/20 1226  ETH <10    IMAGING past 24 hours MR ANGIO HEAD WO CONTRAST  Result Date: 11/14/2020 CLINICAL DATA:  Stroke follow-up. EXAM: MRA HEAD WITHOUT CONTRAST TECHNIQUE: Angiographic images of the Circle of Willis were acquired using MRA technique without intravenous contrast. COMPARISON:  MRI head from the same day.  MRA September 05, 2020. FINDINGS: Motion limited exam.  Within this limitation: Anterior circulation: No large vessel occlusion, proximal hemodynamically significant stenosis, or visible aneurysm. Posterior circulation: Similar left dominant vertebral artery. Similar prominent intradural left vertebral artery without focal aneurysm. No large  vessel occlusion or proximal hemodynamically significant stenosis. IMPRESSION: No large vessel occlusion or proximal hemodynamically significant stenosis on this motion limited MRA. Electronically Signed   By: Margaretha Sheffield MD   On: 11/14/2020 12:45   MR BRAIN WO CONTRAST  Result Date: 11/14/2020 CLINICAL DATA:  TIA. EXAM: MRI HEAD WITHOUT CONTRAST TECHNIQUE: Multiplanar, multiecho pulse sequences of the brain and surrounding structures were obtained without intravenous contrast. COMPARISON:  Head CT from earlier today FINDINGS: Brain: No acute infarction, hemorrhage, hydrocephalus, extra-axial collection or mass lesion. Age congruent cerebral brain volume and white matter appearance with few remote microvascular type insults. Vascular: Normal flow voids. Skull and upper cervical spine: Normal marrow signal. Sinuses/Orbits: Negative IMPRESSION: No acute finding.  Unremarkable study for age. Electronically Signed   By: Monte Fantasia M.D.   On: 11/14/2020 10:43   VAS US CAROTID  Result Date: 11/14/2020 Carotid Arterial Duplex Study Patient Name:  Sara Brown [facility]  Date of Exam:   11/14/2020 Medical Rec #: [id number]               Accession #:    [id number] Date of Birth: 1940/08/11               Patient Gender: F Patient Age:  89W Exam Location:  [facility] Procedure:      VAS US CAROTID Referring Phys: [id number] [place] --------------------------------------------------------------------------------  Indications:       Parasthesia. Risk Factors:      Hypertension, hyperlipidemia, Diabetes. Comparison Study:  No prior study Performing Technologist: Maudry Mayhew MHA, RDMS, RVT, RDCS  Examination Guidelines: A complete evaluation includes B-mode imaging, spectral Doppler, color Doppler, and power Doppler as needed of all accessible portions of each vessel. Bilateral testing is considered an integral part of a complete examination. Limited examinations for reoccurring  indications may be performed as noted.  Right Carotid Findings: +----------+--------+--------+--------+------------------+------------------+           PSV cm/sEDV cm/sStenosisPlaque DescriptionComments           +----------+--------+--------+--------+------------------+------------------+ CCA Prox  83      13                                intimal thickening +----------+--------+--------+--------+------------------+------------------+ CCA Distal72      14                                                   +----------+--------+--------+--------+------------------+------------------+ ICA Prox  64      14                                                   +----------+--------+--------+--------+------------------+------------------+ ICA Distal60      17                                                   +----------+--------+--------+--------+------------------+------------------+ ECA       68                                                           +----------+--------+--------+--------+------------------+------------------+ +----------+--------+-------+----------------+-------------------+           PSV cm/sEDV cmsDescribe        Arm Pressure (mmHG) +----------+--------+-------+----------------+-------------------+ [id number]            Multiphasic, WNL                    +----------+--------+-------+----------------+-------------------+ +---------+--------+--+--------+-+---------+ VertebralPSV cm/s31EDV cm/s6Antegrade +---------+--------+--+--------+-+---------+  Left Carotid Findings: +----------+--------+--------+--------+------------------+--------+           PSV cm/sEDV cm/sStenosisPlaque DescriptionComments +----------+--------+--------+--------+------------------+--------+ CCA Prox  82      17                                         +----------+--------+--------+--------+------------------+--------+ CCA Distal81      26                                          +----------+--------+--------+--------+------------------+--------+  ICA Prox  62      13                                         +----------+--------+--------+--------+------------------+--------+ ICA Distal97      34                                         +----------+--------+--------+--------+------------------+--------+ ECA       73      10                                         +----------+--------+--------+--------+------------------+--------+ +----------+--------+--------+----------------+-------------------+           PSV cm/sEDV cm/sDescribe        Arm Pressure (mmHG) +----------+--------+--------+----------------+-------------------+ [id number]             Multiphasic, WNL                    +----------+--------+--------+----------------+-------------------+ +---------+--------+--+--------+--+---------+ VertebralPSV cm/s49EDV cm/s14Antegrade +---------+--------+--+--------+--+---------+   Summary: Right Carotid: Velocities in the right ICA are consistent with a 1-39% stenosis. Left Carotid: Velocities in the left ICA are consistent with a 1-39% stenosis. Vertebrals:  Bilateral vertebral arteries demonstrate antegrade flow. Subclavians: Normal flow hemodynamics were seen in bilateral subclavian              arteries. *See table(s) above for measurements and observations.     Preliminary    VAS [place] LOWER EXTREMITY VENOUS (DVT)  Result Date: 11/14/2020  Lower Venous DVT Study Patient Name:  Sara Brown [facility]  Date of Exam:   11/14/2020 Medical Rec #: [id number]               Accession #:    [id number] Date of Birth: Oct 28, 1940               Patient Gender: F Patient Age:   79Y Exam Location:  [facility] Procedure:      VAS [place] LOWER EXTREMITY VENOUS (DVT) Referring Phys: [id number] [place] --------------------------------------------------------------------------------  Indications: Swelling.  Comparison Study: No prior  study Performing Technologist: Maudry Mayhew MHA, RDMS, RVT, RDCS  Examination Guidelines: A complete evaluation includes B-mode imaging, spectral Doppler, color Doppler, and power Doppler as needed of all accessible portions of each vessel. Bilateral testing is considered an integral part of a complete examination. Limited examinations for reoccurring indications may be performed as noted. The reflux portion of the exam is performed with the patient in reverse Trendelenburg.  +---------+---------------+---------+-----------+----------+--------------+ RIGHT    CompressibilityPhasicitySpontaneityPropertiesThrombus Aging +---------+---------------+---------+-----------+----------+--------------+ CFV      Full           Yes      Yes                                 +---------+---------------+---------+-----------+----------+--------------+ SFJ      Full                                                        +---------+---------------+---------+-----------+----------+--------------+  FV Prox  Full                                                        +---------+---------------+---------+-----------+----------+--------------+ FV Mid   Full                                                        +---------+---------------+---------+-----------+----------+--------------+ FV DistalFull                                                        +---------+---------------+---------+-----------+----------+--------------+ PFV      Full                                                        +---------+---------------+---------+-----------+----------+--------------+ POP      Full           Yes      Yes                                 +---------+---------------+---------+-----------+----------+--------------+ PTV      Full                                                        +---------+---------------+---------+-----------+----------+--------------+ PERO     Full                                                         +---------+---------------+---------+-----------+----------+--------------+   +---------+---------------+---------+-----------+----------+--------------+ LEFT     CompressibilityPhasicitySpontaneityPropertiesThrombus Aging +---------+---------------+---------+-----------+----------+--------------+ CFV      Full           Yes      Yes                                 +---------+---------------+---------+-----------+----------+--------------+ SFJ      Full                                                        +---------+---------------+---------+-----------+----------+--------------+ FV Prox  Full                                                        +---------+---------------+---------+-----------+----------+--------------+  FV Mid   Full                                                        +---------+---------------+---------+-----------+----------+--------------+ FV DistalFull                                                        +---------+---------------+---------+-----------+----------+--------------+ PFV      Full                                                        +---------+---------------+---------+-----------+----------+--------------+ POP      Full           Yes      Yes                                 +---------+---------------+---------+-----------+----------+--------------+ PTV      Full                                                        +---------+---------------+---------+-----------+----------+--------------+ PERO     Full                                                        +---------+---------------+---------+-----------+----------+--------------+    Summary: RIGHT: - There is no evidence of deep vein thrombosis in the lower extremity.  - No cystic structure found in the popliteal fossa.  LEFT: - There is no evidence of deep vein thrombosis in the lower  extremity.  - No cystic structure found in the popliteal fossa.  *See table(s) above for measurements and observations.    Preliminary     PHYSICAL EXAM General: Appears well-developed, no acute distress Psych: Affect appropriate to situation Eyes: No scleral injection HENT: No OP obstrucion Head: Normocephalic.  Cardiovascular: Normal rate and regular rhythm. Respiratory: Effort normal and breath sounds normal to anterior ascultation GI: Soft.  No distension. There is no tenderness.  Skin: WDI, Neg Homans sign with bilat calf pain    Neurological Examination Mental Status: Alert, oriented, thought content appropriate.  Speech fluent without evidence of aphasia. Able to follow 3 step commands without difficulty. Cranial Nerves: II: Visual fields grossly normal,  III,IV, VI: ptosis not present, extra-ocular motions intact bilaterally, pupils equal, round, reactive to light and accommodation V,VII: smile symmetric, facial light touch sensation normal bilaterally VIII: hearing normal bilaterally IX,X: uvula rises symmetrically XI: bilateral shoulder shrug XII: midline tongue extension Motor: Right : Upper extremity   5/5    Left:     Upper extremity   5/5  Lower extremity   5/5  Lower extremity   5/5 Tone and bulk:normal tone throughout; no atrophy noted Sensory: Pinprick and light touch intact throughout, bilaterally Deep Tendon Reflexes: 2+ and symmetric throughout Plantars: Right: downgoing   Left: downgoing Cerebellar: normal finger-to-nose, normal rapid alternating movements and normal heel-to-shin test Gait: normal gait and station  ASSESSMENT/PLAN [name] is a 80 y.o. female with history of of hypertension, diabetes, hyperlipidemia who presented to the emergency department complaining of right-sided numbness. Work up for possible TIA vs stroke vs seizures. (Keppra started in March for abnormal EEG).  TIA: small vessel disease source unlikely given  multifocal symptoms . partial seizures possible  Code StrokeCT head No acute abnormality. Small vessel disease.  MRI  neg  MRA  neg  Carotid Doppler Bilat carotids <39%  2D Echo pending  LDL 101  HgbA1c 6.4  VTE prophylaxis - lovenox    Diet   Diet Heart Room service appropriate? Yes; Fluid consistency: Thin     81mg  ASA daily prior to admission, now on ASA daily, pending work up, may adjust to add plavix  Therapy recommendations:  pending  Disposition:  pending  Hypertension  Home meds:  Catapress, Apresoline, isordil, Aldactone  Stable . No role in Permissive hypertension  . Long-term BP goal normotensive  Hyperlipidemia  Home meds: Pravachol, resumed in hospital  LDL 101, goal < 100 given no stroke  Continue statin at discharge  Diabetes type II Controlled  Home meds:  none  HgbA1c 6.4, goal < 7.0  CBGs  No results for input(s): GLUCAP in the last 72 hours.   SSI  Other Stroke Risk Factors  Advanced Age >/= 38   Cigarette smoker; advised to stop smoking  Hx stroke/TIA   Other Active Problems - EEG pending. She was started on Keppra for possible seizures (EEG showed focal left temporal slowing 09/05/20).  -Bilat calf pain- did not see swelling, warmth or redness. However, this prevented pt from safe ambulation. Will ck LE [place].  Hospital day # 0  Desiree Metzger-Cihelka, ARNP-C, ANVP-BC Pager: [phone] I have personally obtained history,examined this patient, reviewed notes, independently viewed imaging studies, participated in medical decision making and plan of care.ROS completed by me personally and pertinent positives fully documented  I have made any additions or clarifications directly to the above note. Agree with note above.  She developed sudden onset of right-sided paresthesias which appear to have resolved.  She also has multifocal complaints and not sure that this definitely represents a TIA I and work-up is so far negative.   Recommend check EEG for seizures.  Continue ongoing stroke work-up.  Recommend aspirin and Plavix for 3 weeks followed by Plavix alone.  Aggressive risk factor modification.  Greater than 50% time during this 35-minute visit was spent on counseling and coordination of care and discussion with care team.  Antony Contras, MD Medical Director [facility] Pager: [phone] 11/14/2020 5:24 PM  To contact Stroke Continuity provider, please refer to [email or web address] After hours, contact General Neurology

## 2020-11-14 NOTE — Procedures (Signed)
Patient Name: Sara Brown  MRN: [id number]  Epilepsy Attending: Lora Havens  Referring Physician/Provider: Doreatha Massed, NP Date: 11/14/2020 Duration: 26.28 mins  Patient history: 80 year old female presented with right-sided numbness.  EEG to evaluate for seizures.  Level of alertness: Awake, asleep  AEDs during EEG study: Keppra  Technical aspects: This EEG study was done with scalp electrodes positioned according to the 10-20 International system of electrode placement. Electrical activity was acquired at a sampling rate of 500Hz  and reviewed with a high frequency filter of 70Hz  and a low frequency filter of 1Hz . EEG data were recorded continuously and digitally stored.   Description: The posterior dominant rhythm consists of 9-10 Hz activity of moderate voltage (25-35 uV) seen predominantly in posterior head regions, symmetric and reactive to eye opening and eye closing. Sleep was characterized by vertex waves, sleep spindles (12 to 14 Hz), maximal frontocentral region. EEG showed intermittent 3 to 6 Hz theta-delta slowing in left temporal region.  Hyperventilation and photic stimulation were not performed.     ABNORMALITY - Intermittent slow, left temporal region  IMPRESSION: This study is suggestive of nonspecific cortical dysfunction in left temporal region.  No seizures or epileptiform discharges were seen throughout the recording.  [name]

## 2020-11-14 NOTE — Plan of Care (Signed)
  Problem: Education: Goal: Knowledge of General Education information will improve Description: Including pain rating scale, medication(s)/side effects and non-pharmacologic comfort measures Outcome: Progressing   Problem: Health Behavior/Discharge Planning: Goal: Ability to manage health-related needs will improve Outcome: Progressing   Problem: Clinical Measurements: Goal: Ability to maintain clinical measurements within normal limits will improve Outcome: Progressing Goal: Will remain free from infection Outcome: Progressing Goal: Diagnostic test results will improve Outcome: Progressing Goal: Respiratory complications will improve Outcome: Progressing Goal: Cardiovascular complication will be avoided Outcome: Progressing   Problem: Activity: Goal: Risk for activity intolerance will decrease Outcome: Progressing   Problem: Nutrition: Goal: Adequate nutrition will be maintained Outcome: Progressing   Problem: Coping: Goal: Level of anxiety will decrease Outcome: Progressing   Problem: Elimination: Goal: Will not experience complications related to bowel motility Outcome: Progressing Goal: Will not experience complications related to urinary retention Outcome: Progressing   Problem: Pain Managment: Goal: General experience of comfort will improve Outcome: Progressing   Problem: Safety: Goal: Ability to remain free from injury will improve Outcome: Progressing   Problem: Skin Integrity: Goal: Risk for impaired skin integrity will decrease Outcome: Progressing   Problem: Education: Goal: Knowledge of disease or condition will improve Outcome: Progressing Goal: Knowledge of secondary prevention will improve Outcome: Progressing Goal: Knowledge of patient specific risk factors addressed and post discharge goals established will improve Outcome: Progressing Goal: Individualized Educational Video(s) Outcome: Progressing   Problem: Coping: Goal: Will verbalize  positive feelings about self Outcome: Progressing Goal: Will identify appropriate support needs Outcome: Progressing   Problem: Self-Care: Goal: Ability to participate in self-care as condition permits will improve Outcome: Progressing Goal: Verbalization of feelings and concerns over difficulty with self-care will improve Outcome: Progressing Goal: Ability to communicate needs accurately will improve Outcome: Progressing   Problem: Nutrition: Goal: Risk of aspiration will decrease Outcome: Progressing Goal: Dietary intake will improve Outcome: Progressing   Problem: Intracerebral Hemorrhage Tissue Perfusion: Goal: Complications of Intracerebral Hemorrhage will be minimized Outcome: Progressing   Problem: Ischemic Stroke/TIA Tissue Perfusion: Goal: Complications of ischemic stroke/TIA will be minimized Outcome: Progressing   Problem: Spontaneous Subarachnoid Hemorrhage Tissue Perfusion: Goal: Complications of Spontaneous Subarachnoid Hemorrhage will be minimized Outcome: Progressing

## 2020-11-14 NOTE — Progress Notes (Signed)
Echo attempted. EEG in progress. Will attempt again as time permits.

## 2020-11-14 NOTE — Progress Notes (Signed)
OT Cancellation Note  Patient Details Name: Ingri Diemer MRN: [id number] DOB: 06-30-1940   Cancelled Treatment:    Reason Eval/Treat Not Completed: OT screened after speaking with both PT and RN. No needs identified, will sign off. Should new onset of symptoms or problems arise, please feel free to re-consult.   [place] 11/14/2020, 12:04 PM   Jesse Sans OTR/L Acute Rehabilitation Services Pager: [phone] Office: [phone]

## 2020-11-14 NOTE — Progress Notes (Signed)
EEG complete - results pending 

## 2020-11-14 NOTE — Progress Notes (Signed)
[facility] PROGRESS NOTE    Sara Brown  JKK:[id number] DOB: 24-Jun-1940 DOA: 11/13/2020 PCP: Seward Carol, MD      Brief Narrative:  Sara Brown is a 80 y.o. female with medical history significant of seizure, HTN, HLD, TIA, mild intermittent asthma, CAD, presented with new onset of right-sided numbness and weakness  Past Medical History:  Diagnosis Date  . Allergic rhinitis   . Arthritis    "fingers, knees" (02/09/2018)  . Asthma    "stopped taking RX after dr said I don't have this" (02/09/2018)  . Carotid stenosis    ICA(L)  . Complication of anesthesia    "felt the cut w/one of my c-sections" (02/09/2018)  . Constipation   . GERD (gastroesophageal reflux disease)   . Headache    "years ago; before menopause" (02/09/2018)  . Hyperlipidemia   . Hypertension   . Neuropathy    face (R)  . OA (osteoarthritis)   . Osteoporosis   . TIA (transient ischemic attack) ?2005  . Type II diabetes mellitus ([place])   . Vitamin D deficiency       Assessment & Plan:  Right-sided numbness  Improved.  MRA as well as MRI of the brain are unremarkable.  Continue with pravastatin and aspirin.  2D echo is pending.  Hypertension Continue with current medications.  No need for permissive hypertension.  Diabetes mellitus. Hemoglobin A1c was 6.4 2 months ago.  Bradycardia Her blood pressure is stable and is only taking low-dose clonidine at night only.  Will discontinue clonidine.  History of seizure disorder. Continue with Keppra.  EEG is pending.  Neurology consult is appreciated.  History of left carotid artery stenosis. Recent MRI showed less than 50% stenosis bilaterally.          Marland Kitchen aspirin EC  81 mg Oral Daily  . calcium-vitamin D  2 tablet Oral Q breakfast  . cholecalciferol  1,000 Units Oral Daily  . cloNIDine  0.1 mg Oral QHS  . cycloSPORINE  1 drop Both Eyes BID  . enoxaparin (LOVENOX) injection  30 mg Subcutaneous Q24H  .  fluticasone  1 spray Each Nare Daily  . fluticasone furoate-vilanterol  1 puff Inhalation Daily  . levETIRAcetam  500 mg Oral Q12H  . magnesium oxide  200 mg Oral Daily  . pravastatin  20 mg Oral q1800  . spironolactone  50 mg Oral Daily     Current Meds  Medication Sig  . ADVAIR DISKUS 100-50 MCG/DOSE AEPB Inhale 1 puff into the lungs 2 (two) times daily.  Marland Kitchen albuterol (PROVENTIL HFA;VENTOLIN HFA) 108 (90 Base) MCG/ACT inhaler Inhale 1 puff into the lungs every 4 (four) hours as needed for wheezing.  Marland Kitchen alendronate (FOSAMAX) 70 MG tablet Take 70 mg by mouth every Saturday.  Marland Kitchen aspirin EC 81 MG tablet Take 81 mg by mouth daily.  . Calcium Citrate-Vitamin D 315-250 MG-UNIT TABS Take 2 tablets by mouth daily.  . carboxymethylcellulose (REFRESH PLUS) 0.5 % SOLN Place 1 drop into both eyes in the morning and at bedtime.  . Cholecalciferol (VITAMIN D3) 10 MCG (400 UNIT) CAPS Take 800 Units by mouth daily.  . Clobetasol Prop Emollient Base (CLOBETASOL PROPIONATE E) 0.05 % emollient cream Apply 1 application topically 2 (two) times daily as needed. (Patient taking differently: Apply 1 application topically 2 (two) times daily as needed (rash under breast).)  . cloNIDine (CATAPRES) 0.1 MG tablet Take 0.1 mg by mouth See admin instructions. Take 1 tablet by mouth  every night at bedtime. May take additional 1 tablet if blood pressure is high  . Coenzyme Q10 (COQ10) 200 MG CAPS Take 200 mg by mouth daily.  . cycloSPORINE (RESTASIS) 0.05 % ophthalmic emulsion Place 1 drop into both eyes 2 (two) times daily.   . fluticasone (FLONASE) 50 MCG/ACT nasal spray Place 1 spray into both nostrils in the morning and at bedtime.  . Garlic 3614 MG CAPS Take 1,000 mg by mouth daily as needed (high blood pressure).  . hydrALAZINE (APRESOLINE) 100 MG tablet Take 100 mg by mouth 3 (three) times daily.  . isosorbide dinitrate (ISORDIL) 20 MG tablet TAKE 1 TABLET BY MOUTH THREE TIMES A DAY  . levETIRAcetam (KEPPRA) 500 MG  tablet Take 1 tablet (500 mg total) by mouth every 12 (twelve) hours.  . Magnesium 250 MG TABS Take 250 mg by mouth daily.  . Multiple Vitamins-Minerals (MULTIVITAMIN WITH MINERALS) tablet Take 1 tablet by mouth daily. Centrum for Women  . omeprazole (PRILOSEC OTC) 20 MG tablet Take 20 mg by mouth daily as needed (for reflux).   . Pitavastatin Calcium (LIVALO) 1 MG TABS Take 1 mg by mouth daily.  . polyethylene glycol (MIRALAX / GLYCOLAX) packet Take 17 g by mouth daily as needed for moderate constipation.  Marland Kitchen pyridOXINE (VITAMIN B-6) 100 MG tablet Take 100 mg by mouth daily.   Marland Kitchen spironolactone (ALDACTONE) 50 MG tablet Take 50 mg by mouth daily.   Marland Kitchen triamcinolone cream (KENALOG) 0.1 % Apply 1 application topically 2 (two) times daily as needed (rash/ eczema).         Disposition: Status is: Inpatient given ongoing work-up.    MDM: The below labs and imaging reports were reviewed and summarized above.  Medication management as above.     DVT prophylaxis: enoxaparin (LOVENOX) injection 30 mg Start: 11/13/20 2200   Consultants:   Neurology  Procedures:   EEG ,, ECHO pending.    Subjective: Patient reports that she feels better this morning.  Denies numbness or weakness but does report having right calf pain.  Objective: Vitals:   11/14/20 1140 11/14/20 1143 11/14/20 1330 11/14/20 1345  BP: 127/76  135/78   Pulse: (!) 48 (!) 51 (!) 51 65  Resp: 18  19 14   Temp:      TempSrc:      SpO2: 100% 100% 100% 100%  Weight:       No intake or output data in the 24 hours ending 11/14/20 1530 Filed Weights   11/13/20 1218  Weight: 61 kg    Examination: General: Not in obvious distress, alert awake . HENT:   No scleral pallor or icterus noted. Oral mucosa is moist.  Chest:   Clear to auscultation bilaterally. No crackles or wheezes.  CVS: S1 &S2 heard. No murmur.  Regular rate and rhythm. Abdomen: Soft, nontender, nondistended.  Bowel sounds are  normoactive. Extremities: No cyanosis, or edema.   Psych: Alert, awake.  Normal mood and affect CNS:  No cranial nerve deficits.  Power equal in all extremities.   Skin: Warm and dry.  No rashes noted.   Data Reviewed: I have personally reviewed following labs and imaging studies:  CBC: Recent Labs  Lab 11/13/20 1226 11/13/20 1233  WBC 4.5  --   NEUTROABS 3.0  --   HGB 12.3 13.3  HCT 37.3 39.0  MCV 90.3  --   PLT 236  --    Basic Metabolic Panel: Recent Labs  Lab 11/13/20 1226 11/13/20 1233  NA 135 139  K 3.8 4.0  CL 102 102  CO2 25  --   GLUCOSE 149* 144*  BUN 15 16  CREATININE 0.82 0.80  CALCIUM 10.0  --    GFR: Estimated Creatinine Clearance: 52.7 mL/min (by C-G formula based on SCr of 0.8 mg/dL). Liver Function Tests: Recent Labs  Lab 11/13/20 1226  AST 29  ALT 32  ALKPHOS 53  BILITOT 0.5  PROT 7.3  ALBUMIN 3.9   No results for input(s): LIPASE, AMYLASE in the last 168 hours. No results for input(s): AMMONIA in the last 168 hours. Coagulation Profile: Recent Labs  Lab 11/13/20 1226  INR 1.1   Cardiac Enzymes: No results for input(s): CKTOTAL, CKMB, CKMBINDEX, TROPONINI in the last 168 hours. BNP (last 3 results) No results for input(s): PROBNP in the last 8760 hours. HbA1C: No results for input(s): HGBA1C in the last 72 hours. CBG: No results for input(s): GLUCAP in the last 168 hours. Lipid Profile: Recent Labs    11/14/20 0501  CHOL 180  HDL 62  LDLCALC 101*  TRIG 85  CHOLHDL 2.9   Thyroid Function Tests: No results for input(s): TSH, T4TOTAL, FREET4, T3FREE, THYROIDAB in the last 72 hours. Anemia Panel: No results for input(s): VITAMINB12, FOLATE, FERRITIN, TIBC, IRON, RETICCTPCT in the last 72 hours. Urine analysis:    Component Value Date/Time   COLORURINE STRAW (A) 11/13/2020 1326   APPEARANCEUR CLEAR 11/13/2020 1326   LABSPEC 1.005 11/13/2020 1326   PHURINE 9.0 (H) 11/13/2020 1326   GLUCOSEU NEGATIVE 11/13/2020 1326    HGBUR NEGATIVE 11/13/2020 1326   BILIRUBINUR NEGATIVE 11/13/2020 1326   KETONESUR NEGATIVE 11/13/2020 1326   PROTEINUR NEGATIVE 11/13/2020 1326   UROBILINOGEN 0.2 11/27/2014 0311   NITRITE NEGATIVE 11/13/2020 1326   LEUKOCYTESUR NEGATIVE 11/13/2020 1326   Sepsis Labs: @LABRCNTIP (procalcitonin:4,lacticacidven:4)  ) Recent Results (from the past 240 hour(s))  Resp Panel by RT-PCR (Flu A&B, Covid) Nasopharyngeal Swab     Status: None   Collection Time: 11/13/20  1:22 PM   Specimen: Nasopharyngeal Swab; Nasopharyngeal(NP) swabs in vial transport medium  Result Value Ref Range Status   SARS Coronavirus 2 by RT PCR NEGATIVE NEGATIVE Final    Comment: (NOTE) SARS-CoV-2 target nucleic acids are NOT DETECTED.  The SARS-CoV-2 RNA is generally detectable in upper respiratory specimens during the acute phase of infection. The lowest concentration of SARS-CoV-2 viral copies this assay can detect is 138 copies/mL. A negative result does not preclude SARS-Cov-2 infection and should not be used as the sole basis for treatment or other patient management decisions. A negative result may occur with  improper specimen collection/handling, submission of specimen other than nasopharyngeal swab, presence of viral mutation(s) within the areas targeted by this assay, and inadequate number of viral copies(<138 copies/mL). A negative result must be combined with clinical observations, patient history, and epidemiological information. The expected result is Negative.  Fact Sheet for Patients:  EntrepreneurPulse.com.au  Fact Sheet for Healthcare Providers:  IncredibleEmployment.be  This test is no t yet approved or cleared by the [place] FDA and  has been authorized for detection and/or diagnosis of SARS-CoV-2 by FDA under an Emergency Use Authorization (EUA). This EUA will remain  in effect (meaning this test can be used) for the duration of the COVID-19  declaration under Section 564(b)(1) of the Act, 21 U.S.C.section 360bbb-3(b)(1), unless the authorization is terminated  or revoked sooner.       Influenza A by PCR NEGATIVE NEGATIVE Final   Influenza  B by PCR NEGATIVE NEGATIVE Final    Comment: (NOTE) The Xpert Xpress SARS-CoV-2/FLU/RSV plus assay is intended as an aid in the diagnosis of influenza from Nasopharyngeal swab specimens and should not be used as a sole basis for treatment. Nasal washings and aspirates are unacceptable for Xpert Xpress SARS-CoV-2/FLU/RSV testing.  Fact Sheet for Patients: EntrepreneurPulse.com.au  Fact Sheet for Healthcare Providers: IncredibleEmployment.be  This test is not yet approved or cleared by the [place] FDA and has been authorized for detection and/or diagnosis of SARS-CoV-2 by FDA under an Emergency Use Authorization (EUA). This EUA will remain in effect (meaning this test can be used) for the duration of the COVID-19 declaration under Section 564(b)(1) of the Act, 21 U.S.C. section 360bbb-3(b)(1), unless the authorization is terminated or revoked.  Performed at [facility], [place] [address] [place], [place] [address]          Radiology Studies: MR ANGIO HEAD WO CONTRAST  Result Date: 11/14/2020 CLINICAL DATA:  Stroke follow-up. EXAM: MRA HEAD WITHOUT CONTRAST TECHNIQUE: Angiographic images of the Circle of Willis were acquired using MRA technique without intravenous contrast. COMPARISON:  MRI head from the same day.  MRA September 05, 2020. FINDINGS: Motion limited exam.  Within this limitation: Anterior circulation: No large vessel occlusion, proximal hemodynamically significant stenosis, or visible aneurysm. Posterior circulation: Similar left dominant vertebral artery. Similar prominent intradural left vertebral artery without focal aneurysm. No large vessel occlusion or proximal hemodynamically significant stenosis. IMPRESSION: No large  vessel occlusion or proximal hemodynamically significant stenosis on this motion limited MRA. Electronically Signed   By: Margaretha Sheffield MD   On: 11/14/2020 12:45   MR BRAIN WO CONTRAST  Result Date: 11/14/2020 CLINICAL DATA:  TIA. EXAM: MRI HEAD WITHOUT CONTRAST TECHNIQUE: Multiplanar, multiecho pulse sequences of the brain and surrounding structures were obtained without intravenous contrast. COMPARISON:  Head CT from earlier today FINDINGS: Brain: No acute infarction, hemorrhage, hydrocephalus, extra-axial collection or mass lesion. Age congruent cerebral brain volume and white matter appearance with few remote microvascular type insults. Vascular: Normal flow voids. Skull and upper cervical spine: Normal marrow signal. Sinuses/Orbits: Negative IMPRESSION: No acute finding.  Unremarkable study for age. Electronically Signed   By: Monte Fantasia M.D.   On: 11/14/2020 10:43   CT HEAD CODE STROKE WO CONTRAST  Result Date: 11/13/2020 CLINICAL DATA:  Code stroke. In neuro deficit, acute, stroke suspected. Numbness and weakness in the left lower extremity for 2 days. Abnormal feeling in the right side of the body beginning 10 o'clock today. EXAM: CT HEAD WITHOUT CONTRAST TECHNIQUE: Contiguous axial images were obtained from the base of the skull through the vertex without intravenous contrast. COMPARISON:  CT head without contrast 09/05/2020 and 12/17/1 FINDINGS: Brain: No acute infarct, hemorrhage, or mass lesion is present. No significant white matter lesions are present. The ventricles are of normal size. Acute or focal cortical abnormality is present. The brainstem and cerebellum are within normal limits. Vascular: Minimal atherosclerotic calcifications are present the cavernous internal carotid arteries. No hyperdense vessel is present. Skull: Calvarium is intact. No focal lytic or blastic lesions are present. No significant extracranial soft tissue lesion is present. Sinuses/Orbits: The paranasal  sinuses and mastoid air cells are clear. Bilateral lens replacements are noted. Globes and orbits are otherwise unremarkable. ASPECTS [facility] Stroke Program Early CT Score) - Ganglionic level infarction (caudate, lentiform nuclei, internal capsule, insula, M1-M3 cortex): 3/3 - Supraganglionic infarction (M4-M6 cortex): 7/7 Total score (0-10 with 10 being normal): 10/10 IMPRESSION:  Negative CT of the head. Aspects 10/10 The above was relayed via text pager to Dr. Quinn Axe on 11/13/2020 at 12:46 . Electronically Signed   By: San Morelle M.D.   On: 11/13/2020 12:46   VAS US CAROTID  Result Date: 11/14/2020 Carotid Arterial Duplex Study Patient Name:  INFINITY JEFFORDS [facility]  Date of Exam:   11/14/2020 Medical Rec #: [id number]               Accession #:    [id number] Date of Birth: 03-14-1941               Patient Gender: F Patient Age:   79Y Exam Location:  [facility] Procedure:      VAS US CAROTID Referring Phys: [id number] [place] --------------------------------------------------------------------------------  Indications:       Parasthesia. Risk Factors:      Hypertension, hyperlipidemia, Diabetes. Comparison Study:  No prior study Performing Technologist: Maudry Mayhew MHA, RDMS, RVT, RDCS  Examination Guidelines: A complete evaluation includes B-mode imaging, spectral Doppler, color Doppler, and power Doppler as needed of all accessible portions of each vessel. Bilateral testing is considered an integral part of a complete examination. Limited examinations for reoccurring indications may be performed as noted.  Right Carotid Findings: +----------+--------+--------+--------+------------------+------------------+           PSV cm/sEDV cm/sStenosisPlaque DescriptionComments           +----------+--------+--------+--------+------------------+------------------+ CCA Prox  83      13                                intimal thickening  +----------+--------+--------+--------+------------------+------------------+ CCA Distal72      14                                                   +----------+--------+--------+--------+------------------+------------------+ ICA Prox  64      14                                                   +----------+--------+--------+--------+------------------+------------------+ ICA Distal60      17                                                   +----------+--------+--------+--------+------------------+------------------+ ECA       68                                                           +----------+--------+--------+--------+------------------+------------------+ +----------+--------+-------+----------------+-------------------+           PSV cm/sEDV cmsDescribe        Arm Pressure (mmHG) +----------+--------+-------+----------------+-------------------+ [id number]            Multiphasic, WNL                    +----------+--------+-------+----------------+-------------------+ +---------+--------+--+--------+-+---------+ VertebralPSV cm/s31EDV cm/s6Antegrade +---------+--------+--+--------+-+---------+  Left Carotid  Findings: +----------+--------+--------+--------+------------------+--------+           PSV cm/sEDV cm/sStenosisPlaque DescriptionComments +----------+--------+--------+--------+------------------+--------+ CCA Prox  82      17                                         +----------+--------+--------+--------+------------------+--------+ CCA Distal81      26                                         +----------+--------+--------+--------+------------------+--------+ ICA Prox  62      13                                         +----------+--------+--------+--------+------------------+--------+ ICA Distal97      34                                         +----------+--------+--------+--------+------------------+--------+ ECA        73      10                                         +----------+--------+--------+--------+------------------+--------+ +----------+--------+--------+----------------+-------------------+           PSV cm/sEDV cm/sDescribe        Arm Pressure (mmHG) +----------+--------+--------+----------------+-------------------+ [id number]             Multiphasic, WNL                    +----------+--------+--------+----------------+-------------------+ +---------+--------+--+--------+--+---------+ VertebralPSV cm/s49EDV cm/s14Antegrade +---------+--------+--+--------+--+---------+   Summary: Right Carotid: Velocities in the right ICA are consistent with a 1-39% stenosis. Left Carotid: Velocities in the left ICA are consistent with a 1-39% stenosis. Vertebrals:  Bilateral vertebral arteries demonstrate antegrade flow. Subclavians: Normal flow hemodynamics were seen in bilateral subclavian              arteries. *See table(s) above for measurements and observations.     Preliminary    VAS [place] LOWER EXTREMITY VENOUS (DVT)  Result Date: 11/14/2020  Lower Venous DVT Study Patient Name:  NAKEITHA MILLIGAN [facility]  Date of Exam:   11/14/2020 Medical Rec #: [id number]               Accession #:    [id number] Date of Birth: Aug 31, 1940               Patient Gender: F Patient Age:   79Y Exam Location:  [facility] Procedure:      VAS [place] LOWER EXTREMITY VENOUS (DVT) Referring Phys: [id number] PRAMOD S SETHI --------------------------------------------------------------------------------  Indications: Swelling.  Comparison Study: No prior study Performing Technologist: Maudry Mayhew MHA, RDMS, RVT, RDCS  Examination Guidelines: A complete evaluation includes B-mode imaging, spectral Doppler, color Doppler, and power Doppler as needed of all accessible portions of each vessel. Bilateral testing is considered an integral part of a complete examination. Limited examinations for reoccurring indications may be  performed as noted. The reflux portion of the exam is performed with the patient in reverse Trendelenburg.  +---------+---------------+---------+-----------+----------+--------------+ RIGHT  CompressibilityPhasicitySpontaneityPropertiesThrombus Aging +---------+---------------+---------+-----------+----------+--------------+ CFV      Full           Yes      Yes                                 +---------+---------------+---------+-----------+----------+--------------+ SFJ      Full                                                        +---------+---------------+---------+-----------+----------+--------------+ FV Prox  Full                                                        +---------+---------------+---------+-----------+----------+--------------+ FV Mid   Full                                                        +---------+---------------+---------+-----------+----------+--------------+ FV DistalFull                                                        +---------+---------------+---------+-----------+----------+--------------+ PFV      Full                                                        +---------+---------------+---------+-----------+----------+--------------+ POP      Full           Yes      Yes                                 +---------+---------------+---------+-----------+----------+--------------+ PTV      Full                                                        +---------+---------------+---------+-----------+----------+--------------+ PERO     Full                                                        +---------+---------------+---------+-----------+----------+--------------+   +---------+---------------+---------+-----------+----------+--------------+ LEFT     CompressibilityPhasicitySpontaneityPropertiesThrombus Aging +---------+---------------+---------+-----------+----------+--------------+ CFV      Full            Yes      Yes                                 +---------+---------------+---------+-----------+----------+--------------+  SFJ      Full                                                        +---------+---------------+---------+-----------+----------+--------------+ FV Prox  Full                                                        +---------+---------------+---------+-----------+----------+--------------+ FV Mid   Full                                                        +---------+---------------+---------+-----------+----------+--------------+ FV DistalFull                                                        +---------+---------------+---------+-----------+----------+--------------+ PFV      Full                                                        +---------+---------------+---------+-----------+----------+--------------+ POP      Full           Yes      Yes                                 +---------+---------------+---------+-----------+----------+--------------+ PTV      Full                                                        +---------+---------------+---------+-----------+----------+--------------+ PERO     Full                                                        +---------+---------------+---------+-----------+----------+--------------+    Summary: RIGHT: - There is no evidence of deep vein thrombosis in the lower extremity.  - No cystic structure found in the popliteal fossa.  LEFT: - There is no evidence of deep vein thrombosis in the lower extremity.  - No cystic structure found in the popliteal fossa.  *See table(s) above for measurements and observations.    Preliminary         Scheduled Meds: . aspirin EC  81 mg Oral Daily  . calcium-vitamin D  2 tablet Oral Q breakfast  . cholecalciferol  1,000 Units Oral Daily  . cloNIDine  0.1 mg Oral QHS  . cycloSPORINE  1 drop Both Eyes BID  . enoxaparin (LOVENOX)  injection  30 mg Subcutaneous Q24H  . fluticasone  1 spray Each Nare Daily  . fluticasone furoate-vilanterol  1 puff Inhalation Daily  . levETIRAcetam  500 mg Oral Q12H  . magnesium oxide  200 mg Oral Daily  . pravastatin  20 mg Oral q1800  . spironolactone  50 mg Oral Daily   Continuous Infusions:   LOS: 0 days    Time spent:35 minutes   Starlynn Klinkner Manning Charity, MD [facility] 11/14/2020, 3:30 PM     Please page though [place] or Epic secure chat:  For [employer], Adult nurse

## 2020-11-15 ENCOUNTER — Inpatient Hospital Stay (HOSPITAL_COMMUNITY): Payer: Medicare Other

## 2020-11-15 DIAGNOSIS — G459 Transient cerebral ischemic attack, unspecified: Secondary | ICD-10-CM

## 2020-11-15 DIAGNOSIS — E118 Type 2 diabetes mellitus with unspecified complications: Secondary | ICD-10-CM

## 2020-11-15 DIAGNOSIS — I2510 Atherosclerotic heart disease of native coronary artery without angina pectoris: Secondary | ICD-10-CM

## 2020-11-15 DIAGNOSIS — I10 Essential (primary) hypertension: Secondary | ICD-10-CM

## 2020-11-15 LAB — ECHOCARDIOGRAM COMPLETE
Area-P 1/2: 3.16 cm2
S' Lateral: 2.2 cm
Weight: 2152 oz

## 2020-11-15 MED ORDER — CLOPIDOGREL BISULFATE 75 MG PO TABS: 75.0000 mg | ORAL_TABLET | Freq: Every day | ORAL | 0 refills | Status: DC

## 2020-11-15 MED ORDER — ASPIRIN 81 MG PO TBEC: 81.0000 mg | DELAYED_RELEASE_TABLET | Freq: Every day | ORAL | 11 refills | Status: DC

## 2020-11-15 NOTE — Progress Notes (Signed)
STROKE TEAM PROGRESS NOTE   INTERVAL HISTORY Patient states she is feeling better.  Leg and calf pain is improved.  Lower extremity venous Doppler was negative for DVT.  Carotid ultrasound showed no significant extracranial stenosis.  2D echo is done but results are pending.  She has no new complaints today.  Vital signs are stable.  Neurological exam is unchanged. EEG shows focal left temporal slowing but no definite seizure activity. Vitals:   11/14/20 2030 11/15/20 0008 11/15/20 0411 11/15/20 0848  BP: 131/75 137/72 135/68 138/68  Pulse: (!) 50 (!) 46 (!) 45 (!) 44  Resp: 16 18 16 17   Temp: 98.2 F (36.8 C) 98.2 F (36.8 C) 98.3 F (36.8 C) 98.1 F (36.7 C)  TempSrc: Oral Oral Oral Oral  SpO2: 98% 99% 99%   Weight:       CBC:  Recent Labs  Lab 11/13/20 1226 11/13/20 1233  WBC 4.5  --   NEUTROABS 3.0  --   HGB 12.3 13.3  HCT 37.3 39.0  MCV 90.3  --   PLT 236  --    Basic Metabolic Panel:  Recent Labs  Lab 11/13/20 1226 11/13/20 1233  NA 135 139  K 3.8 4.0  CL 102 102  CO2 25  --   GLUCOSE 149* 144*  BUN 15 16  CREATININE 0.82 0.80  CALCIUM 10.0  --    Lipid Panel:  Recent Labs  Lab 11/14/20 0501  CHOL 180  TRIG 85  HDL 62  CHOLHDL 2.9  VLDL 17  LDLCALC 101*   HgbA1c:  Recent Labs  Lab 11/14/20 0501  HGBA1C 5.9*   Urine Drug Screen:  Recent Labs  Lab 11/13/20 1325  LABOPIA NONE DETECTED  COCAINSCRNUR NONE DETECTED  LABBENZ NONE DETECTED  AMPHETMU NONE DETECTED  THCU NONE DETECTED  LABBARB NONE DETECTED    Alcohol Level  Recent Labs  Lab 11/13/20 1226  ETH <10    IMAGING past 24 hours EEG adult  Result Date: 11/14/2020 Lora Havens, MD     11/14/2020  3:31 PM Patient Name: Sara Brown MRN: [id number] Epilepsy Attending: Lora Havens Referring Physician/Provider: Doreatha Massed, NP Date: 11/14/2020 Duration: 26.28 mins Patient history: 80 year old female presented with right-sided numbness.  EEG to evaluate for  seizures. Level of alertness: Awake, asleep AEDs during EEG study: Keppra Technical aspects: This EEG study was done with scalp electrodes positioned according to the 10-20 International system of electrode placement. Electrical activity was acquired at a sampling rate of 500Hz  and reviewed with a high frequency filter of 70Hz  and a low frequency filter of 1Hz . EEG data were recorded continuously and digitally stored. Description: The posterior dominant rhythm consists of 9-10 Hz activity of moderate voltage (25-35 uV) seen predominantly in posterior head regions, symmetric and reactive to eye opening and eye closing. Sleep was characterized by vertex waves, sleep spindles (12 to 14 Hz), maximal frontocentral region. EEG showed intermittent 3 to 6 Hz theta-delta slowing in left temporal region.  Hyperventilation and photic stimulation were not performed.   ABNORMALITY - Intermittent slow, left temporal region IMPRESSION: This study is suggestive of nonspecific cortical dysfunction in left temporal region.  No seizures or epileptiform discharges were seen throughout the recording. Lora Havens   ECHOCARDIOGRAM COMPLETE  Result Date: 11/15/2020    ECHOCARDIOGRAM REPORT   Patient Name:   [name] [facility] Date of Exam: 11/15/2020 Medical Rec #:  [id number]  Height:       66.5 in Accession #:    [id number]             Weight:       134.5 lb Date of Birth:  28-Oct-1940              BSA:          1.699 m Patient Age:    80 years               BP:           135/68 mmHg Patient Gender: F                      HR:           47 bpm. Exam Location:  Inpatient Procedure: 2D Echo, Color Doppler and Cardiac Doppler Indications:    TIA  History:        Patient has prior history of Echocardiogram examinations, most                 recent 10/18/2013. Risk Factors:Hypertension, Diabetes and                 Dyslipidemia.  Sonographer:    Raquel Sarna Senior RDCS Referring Phys: [id number] SURAFEAL GHEDAMU [place]   1. Left ventricular ejection fraction, by estimation, is 60 to 65%. The left ventricle has normal function. The left ventricle has no regional wall motion abnormalities. Left ventricular diastolic parameters are consistent with Grade I diastolic dysfunction (impaired relaxation).  2. Right ventricular systolic function is normal. The right ventricular size is normal. There is normal pulmonary artery systolic pressure.  3. The mitral valve is normal in structure. Trivial mitral valve regurgitation. No evidence of mitral stenosis.  4. The aortic valve is normal in structure. Aortic valve regurgitation is not visualized. No aortic stenosis is present.  5. The inferior vena cava is normal in size with greater than 50% respiratory variability, suggesting right atrial pressure of 3 mmHg. Conclusion(s)/Recommendation(s): No intracardiac source of embolism detected on this transthoracic study. A transesophageal echocardiogram is recommended to exclude cardiac source of embolism if clinically indicated. FINDINGS  Left Ventricle: Left ventricular ejection fraction, by estimation, is 60 to 65%. The left ventricle has normal function. The left ventricle has no regional wall motion abnormalities. The left ventricular internal cavity size was normal in size. There is  no left ventricular hypertrophy. Left ventricular diastolic parameters are consistent with Grade I diastolic dysfunction (impaired relaxation). Right Ventricle: The right ventricular size is normal. No increase in right ventricular wall thickness. Right ventricular systolic function is normal. There is normal pulmonary artery systolic pressure. The tricuspid regurgitant velocity is 2.12 m/s, and  with an assumed right atrial pressure of 3 mmHg, the estimated right ventricular systolic pressure is 79.8 mmHg. Left Atrium: Left atrial size was normal in size. Right Atrium: Right atrial size was normal in size. Pericardium: There is no evidence of pericardial effusion.  Mitral Valve: The mitral valve is normal in structure. Trivial mitral valve regurgitation. No evidence of mitral valve stenosis. Tricuspid Valve: The tricuspid valve is normal in structure. Tricuspid valve regurgitation is trivial. No evidence of tricuspid stenosis. Aortic Valve: The aortic valve is normal in structure. Aortic valve regurgitation is not visualized. No aortic stenosis is present. Pulmonic Valve: The pulmonic valve was normal in structure. Pulmonic valve regurgitation is not visualized. No evidence of pulmonic stenosis. Aorta: The aortic root is normal in size  and structure. Venous: The inferior vena cava is normal in size with greater than 50% respiratory variability, suggesting right atrial pressure of 3 mmHg. IAS/Shunts: No atrial level shunt detected by color flow Doppler.  LEFT VENTRICLE PLAX 2D LVIDd:         3.70 cm  Diastology LVIDs:         2.20 cm  LV e' medial:    5.22 cm/s LV PW:         1.10 cm  LV E/e' medial:  12.9 LV IVS:        1.10 cm  LV e' lateral:   9.46 cm/s LVOT diam:     2.10 cm  LV E/e' lateral: 7.1 LV SV:         89 LV SV Index:   52 LVOT Area:     3.46 cm  RIGHT VENTRICLE RV S prime:     10.80 cm/s TAPSE (M-mode): 2.4 cm LEFT ATRIUM             Index       RIGHT ATRIUM           Index LA diam:        2.20 cm 1.29 cm/m  RA Area:     12.60 cm LA Vol (A2C):   28.5 ml 16.77 ml/m RA Volume:   26.90 ml  15.83 ml/m LA Vol (A4C):   27.0 ml 15.89 ml/m LA Biplane Vol: 29.2 ml 17.19 ml/m  AORTIC VALVE LVOT Vmax:   105.00 cm/s LVOT Vmean:  66.900 cm/s LVOT VTI:    0.256 m  AORTA Ao Root diam: 3.60 cm Ao Asc diam:  3.20 cm MITRAL VALVE               TRICUSPID VALVE MV Area (PHT): 3.16 cm    TR Peak grad:   18.0 mmHg MV Decel Time: 240 msec    TR Vmax:        212.00 cm/s MV E velocity: 67.30 cm/s MV A velocity: 67.30 cm/s  SHUNTS MV E/A ratio:  1.00        Systemic VTI:  0.26 m                            Systemic Diam: 2.10 cm Candee Furbish MD Electronically signed by Candee Furbish MD  Signature Date/Time: 11/15/2020/11:13:50 AM    Final     PHYSICAL EXAM General: Appears well-developed, no acute distress Psych: Affect appropriate to situation Eyes: No scleral injection HENT: No OP obstrucion Head: Normocephalic.  Cardiovascular: Normal rate and regular rhythm. Respiratory: Effort normal and breath sounds normal to anterior ascultation GI: Soft.  No distension. There is no tenderness.  Skin: WDI, Neg Homans sign with bilat calf pain    Neurological Examination Mental Status: Alert, oriented, thought content appropriate.  Speech fluent without evidence of aphasia. Able to follow 3 step commands without difficulty. Cranial Nerves: II: Visual fields grossly normal,  III,IV, VI: ptosis not present, extra-ocular motions intact bilaterally, pupils equal, round, reactive to light and accommodation V,VII: smile symmetric, facial light touch sensation normal bilaterally VIII: hearing normal bilaterally IX,X: uvula rises symmetrically XI: bilateral shoulder shrug XII: midline tongue extension Motor: Right : Upper extremity   5/5    Left:     Upper extremity   5/5  Lower extremity   5/5     Lower extremity   5/5 Tone and bulk:normal tone throughout; no atrophy  noted Sensory: Pinprick and light touch intact throughout, bilaterally Deep Tendon Reflexes: 2+ and symmetric throughout Plantars: Right: downgoing   Left: downgoing Cerebellar: normal finger-to-nose, normal rapid alternating movements and normal heel-to-shin test Gait: normal gait and station  ASSESSMENT/PLAN Ms. Norvella Loscalzo is a 80 y.o. female with history of of hypertension, diabetes, hyperlipidemia who presented to the emergency department complaining of right-sided numbness. Work up for possible TIA vs stroke vs seizures. (Keppra started in March for abnormal EEG).  TIA: small vessel disease source unlikely given multifocal symptoms . partial seizures possible  Code StrokeCT head No acute  abnormality. Small vessel disease.  MRI  neg  MRA  neg  Carotid Doppler Bilat carotids <39%  2D Echo ejection fraction 60 to 65%.  LDL 101  HgbA1c 6.4  VTE prophylaxis - lovenox    Diet   Diet Heart Room service appropriate? Yes; Fluid consistency: Thin    81mg  ASA daily prior to admission, now on ASA daily, pending work up, may adjust to add plavix  Therapy recommendations:  pending  Disposition:  pending  Hypertension  Home meds:  Catapress, Apresoline, isordil, Aldactone  Stable . No role in Permissive hypertension  . Long-term BP goal normotensive  Hyperlipidemia  Home meds: Pravachol, resumed in hospital  LDL 101, goal < 100 given no stroke  Continue statin at discharge  Diabetes type II Controlled  Home meds:  none  HgbA1c 6.4, goal < 7.0  CBGs  No results for input(s): GLUCAP in the last 72 hours.   SSI  Other Stroke Risk Factors  Advanced Age >/= 71   Cigarette smoker; advised to stop smoking  Hx stroke/TIA   Other Active Problems - EEG pending. She was started on Keppra for possible seizures (EEG showed focal left temporal slowing 09/05/20).  -Bilat calf pain- did not see swelling, warmth or redness. However, this prevented pt from safe ambulation. Will ck LE [place].  Hospital day # 1     She developed sudden onset of right-sided paresthesias which appear to have resolved.  She also has multifocal complaints and not sure that this definitely represents a TIA I and work-up is so far negative.  EEG shows focal left temporal slowing but this is unlikely to explain right-sided sensory loss which is more likely to be a TIA from small vessel disease.  Recommend aspirin and Plavix for 3 weeks followed by Plavix alone.  Aggressive risk factor modification.  Greater than 50% time during this 25-minute visit was spent on counseling and coordination of care and discussion with care team. Follow-up as an outpatient stroke clinic in 8 weeks.  Stroke team  will sign off.  Kindly call for questions. Antony Contras, MD Medical Director [place] Pager: [phone] 11/15/2020 1:26 PM  To contact Stroke Continuity provider, please refer to [email or web address] After hours, contact General Neurology

## 2020-11-15 NOTE — Plan of Care (Signed)
  Problem: Education: Goal: Knowledge of General Education information will improve Description: Including pain rating scale, medication(s)/side effects and non-pharmacologic comfort measures Outcome: Adequate for Discharge   Problem: Health Behavior/Discharge Planning: Goal: Ability to manage health-related needs will improve Outcome: Adequate for Discharge   Problem: Clinical Measurements: Goal: Ability to maintain clinical measurements within normal limits will improve Outcome: Adequate for Discharge Goal: Will remain free from infection Outcome: Adequate for Discharge Goal: Diagnostic test results will improve Outcome: Adequate for Discharge Goal: Respiratory complications will improve Outcome: Adequate for Discharge Goal: Cardiovascular complication will be avoided Outcome: Adequate for Discharge   Problem: Activity: Goal: Risk for activity intolerance will decrease Outcome: Adequate for Discharge   Problem: Nutrition: Goal: Adequate nutrition will be maintained Outcome: Adequate for Discharge   Problem: Coping: Goal: Level of anxiety will decrease Outcome: Adequate for Discharge   Problem: Elimination: Goal: Will not experience complications related to bowel motility Outcome: Adequate for Discharge Goal: Will not experience complications related to urinary retention Outcome: Adequate for Discharge   Problem: Pain Managment: Goal: General experience of comfort will improve Outcome: Adequate for Discharge   Problem: Safety: Goal: Ability to remain free from injury will improve Outcome: Adequate for Discharge   Problem: Skin Integrity: Goal: Risk for impaired skin integrity will decrease Outcome: Adequate for Discharge   Problem: Education: Goal: Knowledge of disease or condition will improve Outcome: Adequate for Discharge Goal: Knowledge of secondary prevention will improve Outcome: Adequate for Discharge Goal: Knowledge of patient specific risk factors  addressed and post discharge goals established will improve Outcome: Adequate for Discharge Goal: Individualized Educational Video(s) Outcome: Adequate for Discharge   Problem: Coping: Goal: Will verbalize positive feelings about self Outcome: Adequate for Discharge Goal: Will identify appropriate support needs Outcome: Adequate for Discharge   Problem: Health Behavior/Discharge Planning: Goal: Ability to manage health-related needs will improve Outcome: Adequate for Discharge   Problem: Self-Care: Goal: Ability to participate in self-care as condition permits will improve Outcome: Adequate for Discharge Goal: Verbalization of feelings and concerns over difficulty with self-care will improve Outcome: Adequate for Discharge Goal: Ability to communicate needs accurately will improve Outcome: Adequate for Discharge   Problem: Nutrition: Goal: Risk of aspiration will decrease Outcome: Adequate for Discharge Goal: Dietary intake will improve Outcome: Adequate for Discharge   Problem: Intracerebral Hemorrhage Tissue Perfusion: Goal: Complications of Intracerebral Hemorrhage will be minimized Outcome: Adequate for Discharge   Problem: Ischemic Stroke/TIA Tissue Perfusion: Goal: Complications of ischemic stroke/TIA will be minimized Outcome: Adequate for Discharge   Problem: Spontaneous Subarachnoid Hemorrhage Tissue Perfusion: Goal: Complications of Spontaneous Subarachnoid Hemorrhage will be minimized Outcome: Adequate for Discharge   

## 2020-11-15 NOTE — Evaluation (Signed)
Speech Language Pathology Evaluation Patient Details Name: Sara Brown MRN: [id number] DOB: 25-Jul-1940 Today's Date: 11/15/2020 Time: 3419-3790 SLP Time Calculation (min) (ACUTE ONLY): 26.95 min  Problem List:  Patient Active Problem List   Diagnosis Date Noted  . TIA (transient ischemic attack) 11/13/2020  . Paresthesias 09/05/2020  . Age-related osteoporosis without current pathological fracture 08/18/2020  . Pure hypercholesterolemia 08/18/2020  . Chronic constipation 08/18/2020  . Right ear impacted cerumen 10/26/2019  . Coronary artery disease involving native coronary artery of native heart without angina pectoris 09/17/2018  . Diabetes mellitus type 2, controlled ([place]) 03/31/2018  . Essential hypertension 03/31/2018  . Dyslipidemia 03/31/2018  . Chest pain 02/10/2018  . Chest pain due to coronary artery disease ([place]) 02/09/2018  . Angina at rest [facility]) 02/09/2018  . Bradycardia 02/09/2018  . Skin laxity 06/12/2017  . Hirsutism 06/12/2017  . Hyperpigmentation 02/13/2017  . Neck mass 01/17/2017  . 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 Ear pain, left 02/21/2016  . GERD (gastroesophageal reflux disease)   . Shortness of breath 07/30/2007  . Cough 07/30/2007   Past Medical History:  Past Medical History:  Diagnosis Date  . Allergic rhinitis   . Arthritis    "fingers, knees" (02/09/2018)  . Asthma    "stopped taking RX after dr said I don't have this" (02/09/2018)  . Carotid stenosis    ICA(L)  . Complication of anesthesia    "felt the cut w/one of my c-sections" (02/09/2018)  . Constipation   . GERD (gastroesophageal reflux disease)   . Headache    "years ago; before menopause" (02/09/2018)  . Hyperlipidemia   . Hypertension   . Neuropathy    face (R)  . OA (osteoarthritis)   . Osteoporosis   . TIA (transient ischemic attack) ?2005  . Type II diabetes mellitus ([place])   . Vitamin D deficiency    Past  Surgical History:  Past Surgical History:  Procedure Laterality Date  . CATARACT EXTRACTION W/ INTRAOCULAR LENS  IMPLANT, BILATERAL Bilateral   . CESAREAN SECTION  1971; 1975  . INTRAVASCULAR PRESSURE WIRE/FFR STUDY N/A 04/01/2018   Procedure: INTRAVASCULAR PRESSURE WIRE/FFR STUDY;  Surgeon: Nigel Mormon, MD;  Location: [place] [facility];  Service: Cardiovascular;  Laterality: N/A;  . LEFT HEART CATH AND CORONARY ANGIOGRAPHY N/A 04/01/2018   Procedure: LEFT HEART CATH AND CORONARY ANGIOGRAPHY;  Surgeon: Nigel Mormon, MD;  Location: [place] [facility];  Service: Cardiovascular;  Laterality: N/A;  . TUBAL LIGATION  1975   HPI:  Pt is an 80 y.o. female who presented 6/6 with new onset of right-sided numbness and weakness. MRI brain negative. EEG 6/7: nonspecific cortical dysfunction in left temporal region.  No seizures or epileptiform discharges. PMH: seizure, HTN, HLD, TIA, mild intermittent asthma, CAD.   Assessment / Plan / Recommendation Clinical Impression  Pt participated in speech/language/cognition evaluation. Pt reported that she resides with her husband and was independent with medication and financial management prior to admission. Pt stated that she is retired and has a bachelor's degree. Pt denied any chronic or acute changes in speech, language, or cognition, but she reported multiple episodes of "exploding eggs" due to her forgetting that they were on the stove. She stated that her PCP has referred her to a psychologist since due to concerns regarding her cognition, but that follow up was cost prohibitive at the time. The [facility] Mental Status Examination was completed  to evaluate the pt's cognitive-linguistic skills. She achieved a score of 23/30 which is below the normal limits of 27 or more out of 30 and is suggestive of a mild impairment. She exhibited deficits in the areas of awareness, attention, memory, mental flexibility, complex problem solving,  and executive function. Her speech and language skills were WFL, but repetition was needed due to difficulty with audition, memory, and attention. Pt was alarmed by her performance and became tearful during the evaluation stating that she did not realize that she was having this much difficulty. Pt was advised that this SLP is in agreement with her PCP's recommendation for neuropsychological evaluation and also recommends outpatient SLP follow up thereafter. Pt expressed that she now recognizes her level of difficulty and intends to follow up with the neuropsychology referral.     SLP Assessment  SLP Recommendation/Assessment: All further Speech Lanaguage Pathology  needs can be addressed in the next venue of care SLP Visit Diagnosis: Cognitive communication deficit (R41.841)    Follow Up Recommendations  Outpatient SLP (pending results of neuropsych evaluation)    Frequency and Duration           SLP Evaluation Cognition  Overall Cognitive Status: History of cognitive impairments - at baseline Arousal/Alertness: Awake/alert Orientation Level: Oriented X4 Attention: Focused;Sustained Focused Attention: Appears intact Sustained Attention: Impaired Sustained Attention Impairment: Verbal complex Memory: Impaired Memory Impairment: Retrieval deficit;Decreased recall of new information (Immediate: 5/5; delayed: 3/5; with cues: 2/2) Awareness: Appears intact Problem Solving: Appears intact Executive Function: Reasoning;Sequencing;Organizing Reasoning: Appears intact Sequencing:  (Clock drawing: 4/4) Organizing: Impaired Organizing Impairment: Verbal complex (Backward digit span: 1/2)       Comprehension  Auditory Comprehension Overall Auditory Comprehension: Appears within functional limits for tasks assessed Yes/No Questions: Within Functional Limits Commands: Within Functional Limits Conversation: Complex Reading Comprehension Reading Status: Within funtional limits     Expression Expression Primary Mode of Expression: Verbal Verbal Expression Overall Verbal Expression: Appears within functional limits for tasks assessed Initiation: No impairment Repetition: No impairment Naming: No impairment Pragmatics: No impairment   Oral / Motor  Oral Motor/Sensory Function Overall Oral Motor/Sensory Function: Within functional limits Motor Speech Overall Motor Speech: Appears within functional limits for tasks assessed Respiration: Within functional limits Phonation: Normal Resonance: Within functional limits Articulation: Within functional limitis Motor Speech Errors: Not applicable   Viana Sleep I. Hardin Negus, [place], [place] Office number [phone] Pager [place] 11/15/2020, 1:48 PM

## 2020-11-15 NOTE — Progress Notes (Signed)
Patient discharged from unit at this time via w/c. All discharge information reviewed with patient at bedside. All questions answered appropriately. Patient able to teach back understanding. IV site removed, catheter intact, pressure dressing applied. All patient belongings in disposition of the patient. No further needs at this time.

## 2020-11-15 NOTE — Discharge Summary (Signed)
Physician Discharge Summary  [facility] YPP:[id number] DOB: 18-Jan-1941 DOA: 11/13/2020  PCP: Seward Carol, MD  Admit date: 11/13/2020 Discharge date: 11/15/2020  Admitted From: home  Disposition:  home   Recommendations for Outpatient Follow-up:  1. F/u on neurological symptoms  Home Health:  none  Discharge Condition:  stable   CODE STATUS:  Full code   Diet recommendation:  Heart healthy, carb modified  Consultations:  neurology  Procedures/Studies: . EEG   Discharge Diagnoses:  Principal Problem:   TIA (transient ischemic attack) Active Problems:   Diabetes mellitus type 2, controlled ([place])   Essential hypertension   Coronary artery disease involving native coronary artery of native heart without angina pectoris     Brief Summary: This is an 80 year old female with history of carotid stenosis, gastroesophageal reflux disease, hypertension, hyperlipidemia, type 2 diabetes mellitus and osteoarthritis who presented with numbness of the right side of her face arm and leg with associated weakness for 2 days.  While in the ED she complained of pain in her calves and a venous Doppler was performed which was found to be negative.  Symptoms resolved completely while in the ED.   Hospital Course:  Numbness of the right side with weakness - Neurology work-up has been negative-neurology feels it might have been a TIA a versus partial seizure - MRI negative, MRA of the brain does not reveal any significant stenosis, carotid duplex shows less than 39% stenosis bilaterally, 2D echo does not show thrombus - LDL 101 - A1c 6.4 - EEG reveals left temporal slowing - she is on Keppra as outpatient - neurology recommending 21 days of ASA 81 mg and Plavix and then Plavix also  HTN - can resume all home meds-   HLD - cont Pravachol  DM2 - A1c 6.4- cont home meds   Discharge Exam: Vitals:   11/15/20 0411 11/15/20 0848  BP: 135/68 138/68  Pulse: (!) 45 (!) 44  Resp: 16  17  Temp: 98.3 F (36.8 C) 98.1 F (36.7 C)  SpO2: 99%    Vitals:   11/14/20 2030 11/15/20 0008 11/15/20 0411 11/15/20 0848  BP: 131/75 137/72 135/68 138/68  Pulse: (!) 50 (!) 46 (!) 45 (!) 44  Resp: 16 18 16 17   Temp: 98.2 F (36.8 C) 98.2 F (36.8 C) 98.3 F (36.8 C) 98.1 F (36.7 C)  TempSrc: Oral Oral Oral Oral  SpO2: 98% 99% 99%   Weight:        General: Pt is alert, awake, not in acute distress Cardiovascular: RRR, S1/S2 +, no rubs, no gallops Respiratory: CTA bilaterally, no wheezing, no rhonchi Abdominal: Soft, NT, ND, bowel sounds + Extremities: no edema, no cyanosis   Discharge Instructions  Discharge Instructions    Diet - low sodium heart healthy   Complete by: As directed    Increase activity slowly   Complete by: As directed      Allergies as of 11/15/2020      Reactions   Naproxen Other (See Comments)   Patient preference  Other reaction(s): upset stomach   Prednisone Other (See Comments)   Patient Preference  Other reaction(s): upset stomach   Amlodipine Hives   Losartan Rash      Medication List    TAKE these medications   Advair Diskus 100-50 MCG/ACT Aepb Generic drug: fluticasone-salmeterol Inhale 1 puff into the lungs 2 (two) times daily.   albuterol 108 (90 Base) MCG/ACT inhaler Commonly known as: VENTOLIN HFA Inhale 1 puff into the lungs  every 4 (four) hours as needed for wheezing.   alendronate 70 MG tablet Commonly known as: FOSAMAX Take 70 mg by mouth every Saturday.   aspirin EC 81 MG tablet Take 81 mg by mouth daily. What changed: Another medication with the same name was added. Make sure you understand how and when to take each.   aspirin 81 MG EC tablet Take 1 tablet (81 mg total) by mouth daily. Swallow whole. Start taking on: November 16, 2020 What changed: You were already taking a medication with the same name, and this prescription was added. Make sure you understand how and when to take each.   Calcium  Citrate-Vitamin D 315-250 MG-UNIT Tabs Take 2 tablets by mouth daily.   carboxymethylcellulose 0.5 % Soln Commonly known as: REFRESH PLUS Place 1 drop into both eyes in the morning and at bedtime.   Clobetasol Prop Emollient Base 0.05 % emollient cream Commonly known as: Clobetasol Propionate E Apply 1 application topically 2 (two) times daily as needed. What changed: reasons to take this   cloNIDine 0.1 MG tablet Commonly known as: CATAPRES Take 0.1 mg by mouth See admin instructions. Take 1 tablet by mouth every night at bedtime. May take additional 1 tablet if blood pressure is high   clopidogrel 75 MG tablet Commonly known as: PLAVIX Take 1 tablet (75 mg total) by mouth daily. Start taking on: November 16, 2020   CoQ10 200 MG Caps Take 200 mg by mouth daily.   cycloSPORINE 0.05 % ophthalmic emulsion Commonly known as: RESTASIS Place 1 drop into both eyes 2 (two) times daily.   fluticasone 50 MCG/ACT nasal spray Commonly known as: FLONASE Place 1 spray into both nostrils in the morning and at bedtime.   Garlic 8850 MG Caps Take 1,000 mg by mouth daily as needed (high blood pressure).   hydrALAZINE 100 MG tablet Commonly known as: APRESOLINE Take 100 mg by mouth 3 (three) times daily. What changed: Another medication with the same name was removed. Continue taking this medication, and follow the directions you see here.   isosorbide dinitrate 20 MG tablet Commonly known as: ISORDIL TAKE 1 TABLET BY MOUTH THREE TIMES A DAY   levETIRAcetam 500 MG tablet Commonly known as: KEPPRA Take 1 tablet (500 mg total) by mouth every 12 (twelve) hours.   Livalo 1 MG Tabs Generic drug: Pitavastatin Calcium Take 1 mg by mouth daily.   Magnesium 250 MG Tabs Take 250 mg by mouth daily.   multivitamin with minerals tablet Take 1 tablet by mouth daily. Centrum for Women   omeprazole 20 MG tablet Commonly known as: PRILOSEC OTC Take 20 mg by mouth daily as needed (for reflux).    polyethylene glycol 17 g packet Commonly known as: MIRALAX / GLYCOLAX Take 17 g by mouth daily as needed for moderate constipation.   pyridOXINE 100 MG tablet Commonly known as: VITAMIN B-6 Take 100 mg by mouth daily.   spironolactone 50 MG tablet Commonly known as: ALDACTONE Take 50 mg by mouth daily.   triamcinolone cream 0.1 % Commonly known as: KENALOG Apply 1 application topically 2 (two) times daily as needed (rash/ eczema).   Vitamin D3 10 MCG (400 UNIT) Caps Take 800 Units by mouth daily.       Allergies  Allergen Reactions  . Naproxen Other (See Comments)    Patient preference  Other reaction(s): upset stomach  . Prednisone Other (See Comments)    Patient Preference  Other reaction(s): upset stomach  . Amlodipine  Hives  . Losartan Rash      MR ANGIO HEAD WO CONTRAST  Result Date: 11/14/2020 CLINICAL DATA:  Stroke follow-up. EXAM: MRA HEAD WITHOUT CONTRAST TECHNIQUE: Angiographic images of the Circle of Willis were acquired using MRA technique without intravenous contrast. COMPARISON:  MRI head from the same day.  MRA September 05, 2020. FINDINGS: Motion limited exam.  Within this limitation: Anterior circulation: No large vessel occlusion, proximal hemodynamically significant stenosis, or visible aneurysm. Posterior circulation: Similar left dominant vertebral artery. Similar prominent intradural left vertebral artery without focal aneurysm. No large vessel occlusion or proximal hemodynamically significant stenosis. IMPRESSION: No large vessel occlusion or proximal hemodynamically significant stenosis on this motion limited MRA. Electronically Signed   By: Margaretha Sheffield MD   On: 11/14/2020 12:45   MR BRAIN WO CONTRAST  Result Date: 11/14/2020 CLINICAL DATA:  TIA. EXAM: MRI HEAD WITHOUT CONTRAST TECHNIQUE: Multiplanar, multiecho pulse sequences of the brain and surrounding structures were obtained without intravenous contrast. COMPARISON:  Head CT from earlier today  FINDINGS: Brain: No acute infarction, hemorrhage, hydrocephalus, extra-axial collection or mass lesion. Age congruent cerebral brain volume and white matter appearance with few remote microvascular type insults. Vascular: Normal flow voids. Skull and upper cervical spine: Normal marrow signal. Sinuses/Orbits: Negative IMPRESSION: No acute finding.  Unremarkable study for age. Electronically Signed   By: Monte Fantasia M.D.   On: 11/14/2020 10:43   EEG adult  Result Date: 11/14/2020 Lora Havens, MD     11/14/2020  3:31 PM Patient Name: Sara Brown MRN: [id number] Epilepsy Attending: Lora Havens Referring Physician/Provider: Doreatha Massed, NP Date: 11/14/2020 Duration: 26.28 mins Patient history: 80 year old female presented with right-sided numbness.  EEG to evaluate for seizures. Level of alertness: Awake, asleep AEDs during EEG study: Keppra Technical aspects: This EEG study was done with scalp electrodes positioned according to the 10-20 International system of electrode placement. Electrical activity was acquired at a sampling rate of 500Hz  and reviewed with a high frequency filter of 70Hz  and a low frequency filter of 1Hz . EEG data were recorded continuously and digitally stored. Description: The posterior dominant rhythm consists of 9-10 Hz activity of moderate voltage (25-35 uV) seen predominantly in posterior head regions, symmetric and reactive to eye opening and eye closing. Sleep was characterized by vertex waves, sleep spindles (12 to 14 Hz), maximal frontocentral region. EEG showed intermittent 3 to 6 Hz theta-delta slowing in left temporal region.  Hyperventilation and photic stimulation were not performed.   ABNORMALITY - Intermittent slow, left temporal region IMPRESSION: This study is suggestive of nonspecific cortical dysfunction in left temporal region.  No seizures or epileptiform discharges were seen throughout the recording. Lora Havens   MM 3D SCREEN BREAST  BILATERAL  Result Date: 10/25/2020 CLINICAL DATA:  Screening. EXAM: DIGITAL SCREENING BILATERAL MAMMOGRAM WITH TOMOSYNTHESIS AND CAD TECHNIQUE: Bilateral screening digital craniocaudal and mediolateral oblique mammograms were obtained. Bilateral screening digital breast tomosynthesis was performed. The images were evaluated with computer-aided detection. COMPARISON:  Previous exam(s). ACR Breast Density Category b: There are scattered areas of fibroglandular density. FINDINGS: There are no findings suspicious for malignancy. The images were evaluated with computer-aided detection. IMPRESSION: No mammographic evidence of malignancy. A result letter of this screening mammogram will be mailed directly to the patient. RECOMMENDATION: Screening mammogram in one year. (Code:SM-B-01Y) BI-RADS CATEGORY  1: Negative. Electronically Signed   By: Franki Cabot M.D.   On: 10/25/2020 13:57   ECHOCARDIOGRAM COMPLETE  Result Date: 11/15/2020  ECHOCARDIOGRAM REPORT   Patient Name:   SILA SARSFIELD [facility] Date of Exam: 11/15/2020 Medical Rec #:  [id number]              Height:       66.5 in Accession #:    [id number]             Weight:       134.5 lb Date of Birth:  Jul 04, 1940              BSA:          1.699 m Patient Age:    48 years               BP:           135/68 mmHg Patient Gender: F                      HR:           47 bpm. Exam Location:  Inpatient Procedure: 2D Echo, Color Doppler and Cardiac Doppler Indications:    TIA  History:        Patient has prior history of Echocardiogram examinations, most                 recent 10/18/2013. Risk Factors:Hypertension, Diabetes and                 Dyslipidemia.  Sonographer:    Raquel Sarna Senior RDCS Referring Phys: [id number] SURAFEAL GHEDAMU [place]  1. Left ventricular ejection fraction, by estimation, is 60 to 65%. The left ventricle has normal function. The left ventricle has no regional wall motion abnormalities. Left ventricular diastolic parameters are consistent  with Grade I diastolic dysfunction (impaired relaxation).  2. Right ventricular systolic function is normal. The right ventricular size is normal. There is normal pulmonary artery systolic pressure.  3. The mitral valve is normal in structure. Trivial mitral valve regurgitation. No evidence of mitral stenosis.  4. The aortic valve is normal in structure. Aortic valve regurgitation is not visualized. No aortic stenosis is present.  5. The inferior vena cava is normal in size with greater than 50% respiratory variability, suggesting right atrial pressure of 3 mmHg. Conclusion(s)/Recommendation(s): No intracardiac source of embolism detected on this transthoracic study. A transesophageal echocardiogram is recommended to exclude cardiac source of embolism if clinically indicated. FINDINGS  Left Ventricle: Left ventricular ejection fraction, by estimation, is 60 to 65%. The left ventricle has normal function. The left ventricle has no regional wall motion abnormalities. The left ventricular internal cavity size was normal in size. There is  no left ventricular hypertrophy. Left ventricular diastolic parameters are consistent with Grade I diastolic dysfunction (impaired relaxation). Right Ventricle: The right ventricular size is normal. No increase in right ventricular wall thickness. Right ventricular systolic function is normal. There is normal pulmonary artery systolic pressure. The tricuspid regurgitant velocity is 2.12 m/s, and  with an assumed right atrial pressure of 3 mmHg, the estimated right ventricular systolic pressure is 32.3 mmHg. Left Atrium: Left atrial size was normal in size. Right Atrium: Right atrial size was normal in size. Pericardium: There is no evidence of pericardial effusion. Mitral Valve: The mitral valve is normal in structure. Trivial mitral valve regurgitation. No evidence of mitral valve stenosis. Tricuspid Valve: The tricuspid valve is normal in structure. Tricuspid valve regurgitation is  trivial. No evidence of tricuspid stenosis. Aortic Valve: The aortic valve is normal in structure. Aortic valve regurgitation is not visualized.  No aortic stenosis is present. Pulmonic Valve: The pulmonic valve was normal in structure. Pulmonic valve regurgitation is not visualized. No evidence of pulmonic stenosis. Aorta: The aortic root is normal in size and structure. Venous: The inferior vena cava is normal in size with greater than 50% respiratory variability, suggesting right atrial pressure of 3 mmHg. IAS/Shunts: No atrial level shunt detected by color flow Doppler.  LEFT VENTRICLE PLAX 2D LVIDd:         3.70 cm  Diastology LVIDs:         2.20 cm  LV e' medial:    5.22 cm/s LV PW:         1.10 cm  LV E/e' medial:  12.9 LV IVS:        1.10 cm  LV e' lateral:   9.46 cm/s LVOT diam:     2.10 cm  LV E/e' lateral: 7.1 LV SV:         89 LV SV Index:   52 LVOT Area:     3.46 cm  RIGHT VENTRICLE RV S prime:     10.80 cm/s TAPSE (M-mode): 2.4 cm LEFT ATRIUM             Index       RIGHT ATRIUM           Index LA diam:        2.20 cm 1.29 cm/m  RA Area:     12.60 cm LA Vol (A2C):   28.5 ml 16.77 ml/m RA Volume:   26.90 ml  15.83 ml/m LA Vol (A4C):   27.0 ml 15.89 ml/m LA Biplane Vol: 29.2 ml 17.19 ml/m  AORTIC VALVE LVOT Vmax:   105.00 cm/s LVOT Vmean:  66.900 cm/s LVOT VTI:    0.256 m  AORTA Ao Root diam: 3.60 cm Ao Asc diam:  3.20 cm MITRAL VALVE               TRICUSPID VALVE MV Area (PHT): 3.16 cm    TR Peak grad:   18.0 mmHg MV Decel Time: 240 msec    TR Vmax:        212.00 cm/s MV E velocity: 67.30 cm/s MV A velocity: 67.30 cm/s  SHUNTS MV E/A ratio:  1.00        Systemic VTI:  0.26 m                            Systemic Diam: 2.10 cm Candee Furbish MD Electronically signed by Candee Furbish MD Signature Date/Time: 11/15/2020/11:13:50 AM    Final    CT HEAD CODE STROKE WO CONTRAST  Result Date: 11/13/2020 CLINICAL DATA:  Code stroke. In neuro deficit, acute, stroke suspected. Numbness and weakness in the left  lower extremity for 2 days. Abnormal feeling in the right side of the body beginning 10 o'clock today. EXAM: CT HEAD WITHOUT CONTRAST TECHNIQUE: Contiguous axial images were obtained from the base of the skull through the vertex without intravenous contrast. COMPARISON:  CT head without contrast 09/05/2020 and 12/17/1 FINDINGS: Brain: No acute infarct, hemorrhage, or mass lesion is present. No significant white matter lesions are present. The ventricles are of normal size. Acute or focal cortical abnormality is present. The brainstem and cerebellum are within normal limits. Vascular: Minimal atherosclerotic calcifications are present the cavernous internal carotid arteries. No hyperdense vessel is present. Skull: Calvarium is intact. No focal lytic or blastic lesions are present. No significant extracranial  soft tissue lesion is present. Sinuses/Orbits: The paranasal sinuses and mastoid air cells are clear. Bilateral lens replacements are noted. Globes and orbits are otherwise unremarkable. ASPECTS [facility] Stroke Program Early CT Score) - Ganglionic level infarction (caudate, lentiform nuclei, internal capsule, insula, M1-M3 cortex): 3/3 - Supraganglionic infarction (M4-M6 cortex): 7/7 Total score (0-10 with 10 being normal): 10/10 IMPRESSION: Negative CT of the head. Aspects 10/10 The above was relayed via text pager to Dr. Quinn Axe on 11/13/2020 at 12:46 . Electronically Signed   By: San Morelle M.D.   On: 11/13/2020 12:46   VAS US CAROTID  Result Date: 11/15/2020 Carotid Arterial Duplex Study Patient Name:  LAHELA WOODIN [facility]  Date of Exam:   11/14/2020 Medical Rec #: [id number]               Accession #:    [id number] Date of Birth: [birth year]               Patient Gender: F Patient Age:   79Y Exam Location:  [facility] Procedure:      VAS US CAROTID Referring Phys: [id number] [place] --------------------------------------------------------------------------------  Indications:        Parasthesia. Risk Factors:      Hypertension, hyperlipidemia, Diabetes. Comparison Study:  No prior study Performing Technologist: Maudry Mayhew MHA, RDMS, RVT, RDCS  Examination Guidelines: A complete evaluation includes B-mode imaging, spectral Doppler, color Doppler, and power Doppler as needed of all accessible portions of each vessel. Bilateral testing is considered an integral part of a complete examination. Limited examinations for reoccurring indications may be performed as noted.  Right Carotid Findings: +----------+--------+--------+--------+------------------+------------------+           PSV cm/sEDV cm/sStenosisPlaque DescriptionComments           +----------+--------+--------+--------+------------------+------------------+ CCA Prox  83      13                                intimal thickening +----------+--------+--------+--------+------------------+------------------+ CCA Distal72      14                                                   +----------+--------+--------+--------+------------------+------------------+ ICA Prox  64      14                                                   +----------+--------+--------+--------+------------------+------------------+ ICA Distal60      17                                                   +----------+--------+--------+--------+------------------+------------------+ ECA       68                                                           +----------+--------+--------+--------+------------------+------------------+ +----------+--------+-------+----------------+-------------------+  PSV cm/sEDV cmsDescribe        Arm Pressure (mmHG) +----------+--------+-------+----------------+-------------------+ [id number]            Multiphasic, WNL                    +----------+--------+-------+----------------+-------------------+ +---------+--------+--+--------+-+---------+ VertebralPSV cm/s31EDV  cm/s6Antegrade +---------+--------+--+--------+-+---------+  Left Carotid Findings: +----------+--------+--------+--------+------------------+--------+           PSV cm/sEDV cm/sStenosisPlaque DescriptionComments +----------+--------+--------+--------+------------------+--------+ CCA Prox  82      17                                         +----------+--------+--------+--------+------------------+--------+ CCA Distal81      26                                         +----------+--------+--------+--------+------------------+--------+ ICA Prox  62      13                                         +----------+--------+--------+--------+------------------+--------+ ICA Distal97      34                                         +----------+--------+--------+--------+------------------+--------+ ECA       73      10                                         +----------+--------+--------+--------+------------------+--------+ +----------+--------+--------+----------------+-------------------+           PSV cm/sEDV cm/sDescribe        Arm Pressure (mmHG) +----------+--------+--------+----------------+-------------------+ [id number]             Multiphasic, WNL                    +----------+--------+--------+----------------+-------------------+ +---------+--------+--+--------+--+---------+ VertebralPSV cm/s49EDV cm/s14Antegrade +---------+--------+--+--------+--+---------+   Summary: Right Carotid: Velocities in the right ICA are consistent with a 1-39% stenosis. Left Carotid: Velocities in the left ICA are consistent with a 1-39% stenosis. Vertebrals:  Bilateral vertebral arteries demonstrate antegrade flow. Subclavians: Normal flow hemodynamics were seen in bilateral subclavian              arteries. *See table(s) above for measurements and observations.  Electronically signed by Antony Contras MD on 11/15/2020 at 12:24:30 PM.    Final    VAS [place] LOWER EXTREMITY  VENOUS (DVT)  Result Date: 11/14/2020  Lower Venous DVT Study Patient Name:  BONNEE ZERTUCHE [facility]  Date of Exam:   11/14/2020 Medical Rec #: [id number]               Accession #:    [id number] Date of Birth: 08-28-1940               Patient Gender: F Patient Age:   79Y Exam Location:  [facility] Procedure:      VAS [place] LOWER EXTREMITY VENOUS (DVT) Referring Phys: [id number] PRAMOD S SETHI --------------------------------------------------------------------------------  Indications: Swelling.  Comparison Study: No prior study Performing Technologist: Maudry Mayhew MHA, RDMS, RVT,  RDCS  Examination Guidelines: A complete evaluation includes B-mode imaging, spectral Doppler, color Doppler, and power Doppler as needed of all accessible portions of each vessel. Bilateral testing is considered an integral part of a complete examination. Limited examinations for reoccurring indications may be performed as noted. The reflux portion of the exam is performed with the patient in reverse Trendelenburg.  +---------+---------------+---------+-----------+----------+--------------+ RIGHT    CompressibilityPhasicitySpontaneityPropertiesThrombus Aging +---------+---------------+---------+-----------+----------+--------------+ CFV      Full           Yes      Yes                                 +---------+---------------+---------+-----------+----------+--------------+ SFJ      Full                                                        +---------+---------------+---------+-----------+----------+--------------+ FV Prox  Full                                                        +---------+---------------+---------+-----------+----------+--------------+ FV Mid   Full                                                        +---------+---------------+---------+-----------+----------+--------------+ FV DistalFull                                                         +---------+---------------+---------+-----------+----------+--------------+ PFV      Full                                                        +---------+---------------+---------+-----------+----------+--------------+ POP      Full           Yes      Yes                                 +---------+---------------+---------+-----------+----------+--------------+ PTV      Full                                                        +---------+---------------+---------+-----------+----------+--------------+ PERO     Full                                                        +---------+---------------+---------+-----------+----------+--------------+   +---------+---------------+---------+-----------+----------+--------------+  LEFT     CompressibilityPhasicitySpontaneityPropertiesThrombus Aging +---------+---------------+---------+-----------+----------+--------------+ CFV      Full           Yes      Yes                                 +---------+---------------+---------+-----------+----------+--------------+ SFJ      Full                                                        +---------+---------------+---------+-----------+----------+--------------+ FV Prox  Full                                                        +---------+---------------+---------+-----------+----------+--------------+ FV Mid   Full                                                        +---------+---------------+---------+-----------+----------+--------------+ FV DistalFull                                                        +---------+---------------+---------+-----------+----------+--------------+ PFV      Full                                                        +---------+---------------+---------+-----------+----------+--------------+ POP      Full           Yes      Yes                                  +---------+---------------+---------+-----------+----------+--------------+ PTV      Full                                                        +---------+---------------+---------+-----------+----------+--------------+ PERO     Full                                                        +---------+---------------+---------+-----------+----------+--------------+     Summary: RIGHT: - There is no evidence of deep vein thrombosis in the lower extremity.  - No cystic structure found in the popliteal fossa.  LEFT: - There is no evidence of deep vein thrombosis in the lower extremity.  - No  cystic structure found in the popliteal fossa.  *See table(s) above for measurements and observations. Electronically signed by Ruta Hinds MD on 11/14/2020 at 4:26:03 PM.    Final      The results of significant diagnostics from this hospitalization (including imaging, microbiology, ancillary and laboratory) are listed below for reference.     Microbiology: Recent Results (from the past 240 hour(s))  Resp Panel by RT-PCR (Flu A&B, Covid) Nasopharyngeal Swab     Status: None   Collection Time: 11/13/20  1:22 PM   Specimen: Nasopharyngeal Swab; Nasopharyngeal(NP) swabs in vial transport medium  Result Value Ref Range Status   SARS Coronavirus 2 by RT PCR NEGATIVE NEGATIVE Final    Comment: (NOTE) SARS-CoV-2 target nucleic acids are NOT DETECTED.  The SARS-CoV-2 RNA is generally detectable in upper respiratory specimens during the acute phase of infection. The lowest concentration of SARS-CoV-2 viral copies this assay can detect is 138 copies/mL. A negative result does not preclude SARS-Cov-2 infection and should not be used as the sole basis for treatment or other patient management decisions. A negative result may occur with  improper specimen collection/handling, submission of specimen other than nasopharyngeal swab, presence of viral mutation(s) within the areas targeted by this assay, and  inadequate number of viral copies(<138 copies/mL). A negative result must be combined with clinical observations, patient history, and epidemiological information. The expected result is Negative.  Fact Sheet for Patients:  EntrepreneurPulse.com.au  Fact Sheet for Healthcare Providers:  IncredibleEmployment.be  This test is no t yet approved or cleared by the [place] FDA and  has been authorized for detection and/or diagnosis of SARS-CoV-2 by FDA under an Emergency Use Authorization (EUA). This EUA will remain  in effect (meaning this test can be used) for the duration of the COVID-19 declaration under Section 564(b)(1) of the Act, 21 U.S.C.section 360bbb-3(b)(1), unless the authorization is terminated  or revoked sooner.       Influenza A by PCR NEGATIVE NEGATIVE Final   Influenza B by PCR NEGATIVE NEGATIVE Final    Comment: (NOTE) The Xpert Xpress SARS-CoV-2/FLU/RSV plus assay is intended as an aid in the diagnosis of influenza from Nasopharyngeal swab specimens and should not be used as a sole basis for treatment. Nasal washings and aspirates are unacceptable for Xpert Xpress SARS-CoV-2/FLU/RSV testing.  Fact Sheet for Patients: EntrepreneurPulse.com.au  Fact Sheet for Healthcare Providers: IncredibleEmployment.be  This test is not yet approved or cleared by the [place] FDA and has been authorized for detection and/or diagnosis of SARS-CoV-2 by FDA under an Emergency Use Authorization (EUA). This EUA will remain in effect (meaning this test can be used) for the duration of the COVID-19 declaration under Section 564(b)(1) of the Act, 21 U.S.C. section 360bbb-3(b)(1), unless the authorization is terminated or revoked.  Performed at [facility], [place] [address], [place], [place] [address]      Labs: BNP (last 3 results) No results for input(s): BNP in the last 8760 hours. Basic  Metabolic Panel: Recent Labs  Lab 11/13/20 1226 11/13/20 1233  NA 135 139  K 3.8 4.0  CL 102 102  CO2 25  --   GLUCOSE 149* 144*  BUN 15 16  CREATININE 0.82 0.80  CALCIUM 10.0  --    Liver Function Tests: Recent Labs  Lab 11/13/20 1226  AST 29  ALT 32  ALKPHOS 53  BILITOT 0.5  PROT 7.3  ALBUMIN 3.9   No results for input(s): LIPASE, AMYLASE in the last 168  hours. No results for input(s): AMMONIA in the last 168 hours. CBC: Recent Labs  Lab 11/13/20 1226 11/13/20 1233  WBC 4.5  --   NEUTROABS 3.0  --   HGB 12.3 13.3  HCT 37.3 39.0  MCV 90.3  --   PLT 236  --    Cardiac Enzymes: No results for input(s): CKTOTAL, CKMB, CKMBINDEX, TROPONINI in the last 168 hours. BNP: Invalid input(s): POCBNP CBG: No results for input(s): GLUCAP in the last 168 hours. D-Dimer No results for input(s): DDIMER in the last 72 hours. Hgb A1c Recent Labs    11/14/20 0501  HGBA1C 5.9*   Lipid Profile Recent Labs    11/14/20 0501  CHOL 180  HDL 62  LDLCALC 101*  TRIG 85  CHOLHDL 2.9   Thyroid function studies No results for input(s): TSH, T4TOTAL, T3FREE, THYROIDAB in the last 72 hours.  Invalid input(s): FREET3 Anemia work up No results for input(s): VITAMINB12, FOLATE, FERRITIN, TIBC, IRON, RETICCTPCT in the last 72 hours. Urinalysis    Component Value Date/Time   COLORURINE STRAW (A) 11/13/2020 1326   APPEARANCEUR CLEAR 11/13/2020 1326   LABSPEC 1.005 11/13/2020 1326   PHURINE 9.0 (H) 11/13/2020 1326   GLUCOSEU NEGATIVE 11/13/2020 1326   HGBUR NEGATIVE 11/13/2020 1326   BILIRUBINUR NEGATIVE 11/13/2020 1326   KETONESUR NEGATIVE 11/13/2020 1326   PROTEINUR NEGATIVE 11/13/2020 1326   UROBILINOGEN 0.2 11/27/2014 0311   NITRITE NEGATIVE 11/13/2020 1326   LEUKOCYTESUR NEGATIVE 11/13/2020 1326   Sepsis Labs Invalid input(s): PROCALCITONIN,  WBC,  LACTICIDVEN Microbiology Recent Results (from the past 240 hour(s))  Resp Panel by RT-PCR (Flu A&B, Covid)  Nasopharyngeal Swab     Status: None   Collection Time: 11/13/20  1:22 PM   Specimen: Nasopharyngeal Swab; Nasopharyngeal(NP) swabs in vial transport medium  Result Value Ref Range Status   SARS Coronavirus 2 by RT PCR NEGATIVE NEGATIVE Final    Comment: (NOTE) SARS-CoV-2 target nucleic acids are NOT DETECTED.  The SARS-CoV-2 RNA is generally detectable in upper respiratory specimens during the acute phase of infection. The lowest concentration of SARS-CoV-2 viral copies this assay can detect is 138 copies/mL. A negative result does not preclude SARS-Cov-2 infection and should not be used as the sole basis for treatment or other patient management decisions. A negative result may occur with  improper specimen collection/handling, submission of specimen other than nasopharyngeal swab, presence of viral mutation(s) within the areas targeted by this assay, and inadequate number of viral copies(<138 copies/mL). A negative result must be combined with clinical observations, patient history, and epidemiological information. The expected result is Negative.  Fact Sheet for Patients:  EntrepreneurPulse.com.au  Fact Sheet for Healthcare Providers:  IncredibleEmployment.be  This test is no t yet approved or cleared by the [place] FDA and  has been authorized for detection and/or diagnosis of SARS-CoV-2 by FDA under an Emergency Use Authorization (EUA). This EUA will remain  in effect (meaning this test can be used) for the duration of the COVID-19 declaration under Section 564(b)(1) of the Act, 21 U.S.C.section 360bbb-3(b)(1), unless the authorization is terminated  or revoked sooner.       Influenza A by PCR NEGATIVE NEGATIVE Final   Influenza B by PCR NEGATIVE NEGATIVE Final    Comment: (NOTE) The Xpert Xpress SARS-CoV-2/FLU/RSV plus assay is intended as an aid in the diagnosis of influenza from Nasopharyngeal swab specimens and should not be  used as a sole basis for treatment. Nasal washings and aspirates are unacceptable for  Xpert Xpress SARS-CoV-2/FLU/RSV testing.  Fact Sheet for Patients: EntrepreneurPulse.com.au  Fact Sheet for Healthcare Providers: IncredibleEmployment.be  This test is not yet approved or cleared by the [place] FDA and has been authorized for detection and/or diagnosis of SARS-CoV-2 by FDA under an Emergency Use Authorization (EUA). This EUA will remain in effect (meaning this test can be used) for the duration of the COVID-19 declaration under Section 564(b)(1) of the Act, 21 U.S.C. section 360bbb-3(b)(1), unless the authorization is terminated or revoked.  Performed at [facility], [place] [address], [place], [place] [address]      Time coordinating discharge in minutes: 65  SIGNED:   Debbe Odea, MD  [facility] 11/15/2020, 3:31 PM

## 2020-11-15 NOTE — Progress Notes (Signed)
Echocardiogram 2D Echocardiogram has been performed.  Sara Brown 11/15/2020, 8:24 AM

## 2020-11-16 NOTE — Telephone Encounter (Signed)
done

## 2020-11-20 ENCOUNTER — Telehealth: Payer: Self-pay

## 2020-11-20 NOTE — Telephone Encounter (Signed)
Please follow up again tomorrow.

## 2020-11-21 NOTE — Telephone Encounter (Signed)
Sara Brown called her back

## 2020-11-22 ENCOUNTER — Encounter: Payer: Self-pay | Admitting: Podiatry

## 2020-11-24 DIAGNOSIS — E1165 Type 2 diabetes mellitus with hyperglycemia: Secondary | ICD-10-CM | POA: Diagnosis not present

## 2020-11-24 DIAGNOSIS — E7800 Pure hypercholesterolemia, unspecified: Secondary | ICD-10-CM | POA: Diagnosis not present

## 2020-11-24 DIAGNOSIS — M792 Neuralgia and neuritis, unspecified: Secondary | ICD-10-CM | POA: Diagnosis not present

## 2020-11-24 DIAGNOSIS — I10 Essential (primary) hypertension: Secondary | ICD-10-CM | POA: Diagnosis not present

## 2020-11-28 ENCOUNTER — Telehealth: Payer: Self-pay | Admitting: Cardiology

## 2020-11-28 ENCOUNTER — Other Ambulatory Visit: Payer: Self-pay | Admitting: Cardiology

## 2020-11-28 DIAGNOSIS — I10 Essential (primary) hypertension: Secondary | ICD-10-CM | POA: Diagnosis not present

## 2020-11-28 DIAGNOSIS — I2510 Atherosclerotic heart disease of native coronary artery without angina pectoris: Secondary | ICD-10-CM

## 2020-11-28 MED ORDER — ISOSORBIDE DINITRATE 40 MG PO TABS: 40.0000 mg | ORAL_TABLET | Freq: Three times a day (TID) | ORAL | 3 refills | Status: DC

## 2020-11-28 NOTE — Telephone Encounter (Signed)
Agree. WIll order. Staff, please schedule renal artery duplex before her next office visit.   Thanks MJP

## 2020-11-28 NOTE — Telephone Encounter (Signed)
Patient paged as to discuss regarding elevated blood pressure, she was seen in the emergency room recently for similar episode and CT and MRI of the brain did not reveal any acute ischemia or infarct.  Patient's symptoms was felt to be nonspecific versus complex migraines versus seizure disorder and is being worked up by neurology.  Patient is also enrolled in CCM and RPM in office, due to her symptoms she was also recommended to have neurology evaluation.  I reviewed her chart, blood pressure at the hospital was completely well controlled when she was admitted for similar symptoms recently.  Advised her to increase isosorbide dinitrate from 20 mg 3 times daily to 40 mg 3 times daily, continue hydralazine 100 mg p.o. 3 times daily.  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  Patient presently asymptomatic with no headache, nausea, visual disturbances.  She was advised to go to the emergency room if she does not feel well, I will also try to make an arrangement for her to be evaluated by [place] further and discuss her presentation with her primary cardiologist.  I spent 15 minutes over the telephone and additional 10 minutes in review of her medical records.    ICD-10-CM   1. Essential hypertension  I10 isosorbide dinitrate (ISORDIL) 40 MG tablet    2. Coronary artery disease involving native coronary artery of native heart without angina pectoris  I25.10 isosorbide dinitrate (ISORDIL) 40 MG tablet     Meds ordered this encounter  Medications   isosorbide dinitrate (ISORDIL) 40 MG tablet    Sig: Take 1 tablet (40 mg total) by mouth 3 (three) times daily.    Dispense:  270 tablet    Refill:  3    Medications Discontinued During This Encounter  Medication Reason   isosorbide dinitrate (ISORDIL) 20 MG tablet      Adrian Prows, MD, [facility] 11/28/2020, 10:19 PM Office: [phone] Fax: [phone] Pager: [phone]

## 2020-11-29 ENCOUNTER — Ambulatory Visit (INDEPENDENT_AMBULATORY_CARE_PROVIDER_SITE_OTHER): Payer: Medicare Other | Admitting: Neurology

## 2020-11-29 ENCOUNTER — Encounter: Payer: Self-pay | Admitting: Neurology

## 2020-11-29 ENCOUNTER — Other Ambulatory Visit: Payer: Self-pay | Admitting: Cardiology

## 2020-11-29 VITALS — BP 121/64 | HR 77 | Ht 66.5 in | Wt 132.3 lb

## 2020-11-29 DIAGNOSIS — I10 Essential (primary) hypertension: Secondary | ICD-10-CM

## 2020-11-29 DIAGNOSIS — G459 Transient cerebral ischemic attack, unspecified: Secondary | ICD-10-CM | POA: Diagnosis not present

## 2020-11-29 DIAGNOSIS — I2510 Atherosclerotic heart disease of native coronary artery without angina pectoris: Secondary | ICD-10-CM | POA: Diagnosis not present

## 2020-11-29 DIAGNOSIS — R202 Paresthesia of skin: Secondary | ICD-10-CM | POA: Diagnosis not present

## 2020-11-29 DIAGNOSIS — R519 Headache, unspecified: Secondary | ICD-10-CM

## 2020-11-29 NOTE — Progress Notes (Signed)
Subjective:    Patient ID: Sara Brown is a 80 y.o. female.  HPI    Interim history:   Sara Brown is an 87 -year-old right-handed woman with an underlying medical history of allergic rhinitis, carotid artery stenosis, reflux disease, hypertension hyperlipidemia, vitamin D deficiency, paresthesias, diabetes, and osteoarthritis, who presents for follow-up consultation for a sooner than scheduled appointment after recent hospitalization.  The patient is accompanied by her husband today.  I last saw her on 10/12/2020 as a referral from the emergency room due to paresthesias, concern for TIA.  She presented to the emergency room on 11/13/2020 with right-sided paresthesias.  You  She was admitted for stroke work-up.  Head CT without contrast on 11/13/2020 showed impression: Negative CT of the head.  She complained of cramps in her legs.  Venous Doppler ultrasound was negative for DVT.  She had a brain MRI without contrast on 11/14/2020 and I reviewed the results: Impression: No acute finding.  Unremarkable study for age.  MR angiogram of the head without contrast showed: Impression: No large vessel occlusion or proximal hemodynamically significant stenosis on this motion limited MRA.  Echocardiogram on 11/15/2020 showed EF of 60 to 65%, no regional wall motion abnormalities.  EEG on 11/14/2020 showed:  IMPRESSION: This study is suggestive of nonspecific cortical dysfunction in left temporal region.  No seizures or epileptiform discharges were seen throughout the recording.  Neurology was consulted.  She was advised to continue with Keppra.  For TIA concerns she was advised to take baby aspirin and Plavix for a total of 3 weeks and then Plavix only thereafter.  She was discharged on 11/15/2020.  Today, 11/29/2020: She reports feeling no new symptoms.  She has intermittent feeling of tongue swelling without obvious swelling.  She has intermittent headaches, she has sometimes twitching around her eye  muscle especially on the left.  Sometimes she feels her legs are weaker.  She feels that the symptoms are correlated with blood pressure fluctuations.  Her cardiologist has increased her isosorbide to 40 mg 3 times daily.  She continues to take aspirin and Plavix.   The patient's allergies, current medications, family history, past medical history, past social history, past surgical history and problem list were reviewed and updated as appropriate.   Previously:   10/12/20: (She) reports having recurrent head pains.  These are not new.  She has blood pressure fluctuations, sometimes her headache hurts when the blood pressure is low and sometimes it hurts when it is up.  She is tolerating the Keppra.  She is in physical therapy.  She takes the Keppra twice daily.  She has had no new symptoms since her hospital stay.  She is currently not driving and was instructed not to drive for 6 months when she was in the hospital.  She tries to hydrate well with water.  She has not fallen recently.   She presented to the emergency room on 09/04/2020 with left-sided paresthesias.  These were noted primarily in her face as well as leg.  Work-up included head imaging tests: She had a head CT without contrast on 09/05/2020 and I reviewed the results: IMPRESSION: No acute finding.  Unremarkable study for age.   She had a brain MRI without contrast as well as MRA neck and MRA head on 09/05/2020 and I reviewed the results: IMPRESSION: 1. No evidence of acute intracranial abnormality.  No acute infarct. 2. No emergent large vessel occlusion or evidence of proximal hemodynamically significant stenosis in the head  or neck. 3. In comparison to 8299, similar dolichoectatic left vertebral artery.   She had an EEG on 09/05/2020 and I reviewed the results: EEG Interpretation: This routine EEG recorded in the awake and drowsy states is abnormal.  The focal slowing present in the left temporal region suggest focal cerebral  dysfunction in this region.  There were no epileptiform discharges present.   She was started on Keppra for the possibility of seizure.    08/29/19: 79 year old right-handed woman with an underlying medical history of hypertension, hyperlipidemia, osteoporosis, arthritis, reflux disease, allergic rhinitis, vitamin D deficiency, carotid artery stenosis on the left, and chronic constipation, who reports recurrent headaches in the past 2 months approximately.  She has fairly short-lived headaches in the top of the head and sometimes in the back of the head or parietal areas.  No temporal tenderness, no jaw pain, no temporal headaches.  She wonders if these could be sinus related, she has a history of postnasal drip and chronic sinus issues.  She takes Flonase every day.  She feels that the headache may also be related to blood pressure elevations.  She has no history of migraine headaches and headaches are not associated with nausea or vomiting or light or sound sensitivity.  She has a eye examination pending for this month.  I reviewed your office note from 08/02/2019.   I saw her in January of last year for a longer standing history of facial paresthesias.  She had a brain MRI without contrast on 06/18/2018 and I reviewed the results: IMPRESSION: This MRI of the brain without contrast shows the following: 1.    Mild chronic microvascular ischemic changes, essentially unchanged compared to the 07/28/2014 MRI. 2.    There are no acute findings.     Of note, she missed an appointment on 04/20/2018. The patient is a 80 year old right-handed woman with an underlying medical history of hypertension, hyperlipidemia, reflux disease, allergic rhinitis, osteoporosis, osteoarthritis, asthma, vitamin D deficiency, carotid stenosis on the left, and chronic constipation, who presents for evaluation of  facial paresthesias. She was seen in the emergency room on 02/09/2018 and reported facial paresthesias and discomfort. She  had a head CT without contrast on 02/09/2018 and I reviewed the results: IMPRESSION: No acute abnormality.   Chronic microvascular ischemic change.   Of note, she had interim emergency room visits and hospitalization for chest pain.   She has had elevated BP values, she sees cardiology for this. She went to the ER in [place] on 01/07/18 for HTN, she had a CTH wo contrast at the time, which was neg. She reports intermittent tingling sensation affecting the left arm and sometimes the left side of her face, sometimes affecting her tongue.    She reports intermittent pain in the left arm, of note, when she was seen in the emergency room she had right-sided symptoms.    10/17/2014: She reports no new symptoms. She feels that sometimes she has a drawing sensation at the left side of her mouth. She has no one-sided numbness or tingling or weakness. She has improved symptoms on the right side. She had recent blood work with her primary care physician last month. We will request test results from his PCP.   I first met her on 07/15/2014 at the request of her primary care physician, at which time she reported a several year history of intermittent abnormal sensations in her right face, also affecting her right arm with pulling sensation in the right arm and shoulders  as well as tingling noted. I suggested further evaluation with an EEG and MRI brain. We called her with all her test results and also she was seen in the interim by her nurse practitioner, Sara Brown on 08/30/2014 at which time we discussed all her test results as well and I also saw her at the time. She felt she may have a facial droop on the left. On examination, she did not have any obvious facial droop on either side. Her exam was nonfocal.   She had an EEG on 07/26/2014 which was reported as normal in the awake state. She had a brain MRI with and without contrast on 07/28/2014: Slightly abnormal MRI scan the brain showing mild changes of chronic  microvascular ischemia and generalized cerebral atrophy. No enhancing lesions are noted. No significant change compared with CT head dated 05/27/2014. In addition, I personally reviewed the images through the PACS system and agree with the findings.   07/15/2014: (She) has a several year history of intermittent abnormal sensations in her right face and R arm, with pulling sensation in the right arm and shoulder with tingling noted. She was told she may have a right facial droop when she saw physical therapy. She is currently in outpatient physical therapy. She has had intermittent problems with abnormal sensation in her right face and arm for years. She may go days without symptoms. She wonders if this is a stress-related reaction. She was rushing to get to this appointment and while driving she had a sensation of tingling that started in her right mid face and went to her right shoulder. She continues to have very mild symptoms currently. Very occasionally will she have symptoms on the left side. She has no left or right lower body symptoms. Strength-wise sometimes she feels a little weak during the episode which can last minutes to hours. It is usually not just seconds. She does not lose consciousness or awareness. She has never noted any abnormal involuntary twitching. She has not noted any muscle wasting. Symptoms started years ago. She had a recent head CT and remote MRI brain. She never had an EEG. There is no overt triggers or alleviating factors. She does not have any associated headaches with this but certainly does report occasional left parietal headaches which are not debilitating.   She had a CT head without contrast on 05/27/2014: 1. No acute intracranial findings. 2. Mild microvascular disease. In addition, personally reviewed the images through the PACS system.   She had 2D echo on 10/18/13: this was unremarkable with EF 60-65%.   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 No other significant ischemic changes are noted. The enhancement pattern is normal and there is no mass lesion. No intracranial hemorrhage is identified.   In addition, personally reviewed the images through the PACS system. She had a MRA head without contrast and MRA neck with contrast on 03/09/2004 because of dizziness reported: MR angiography of the intracranial circulation shows no significant proximal stenosis or intracranial atherosclerotic change.   Advanced dolichoectasia of the craniocervical vessels without significant proximal stenosis. She had a BMP on 06/15/2014: This was unremarkable.  Her Past Medical History Is Significant For: Past Medical History:  Diagnosis Date   Allergic rhinitis    Arthritis    "fingers, knees" (02/09/2018)   Asthma    "stopped taking RX after dr said  I don't have this" (02/09/2018)   Carotid stenosis    ICA(L)   Complication of anesthesia    "felt the cut w/one of my c-sections" (02/09/2018)   Constipation    GERD (gastroesophageal reflux disease)    Headache    "years ago; before menopause" (02/09/2018)   Hyperlipidemia    Hypertension    Neuropathy    face (R)   OA (osteoarthritis)    Osteoporosis    TIA (transient ischemic attack) ?2005   Type II diabetes mellitus (HCC)    Vitamin D deficiency     Her Past Surgical History Is Significant For: Past Surgical History:  Procedure Laterality Date   CATARACT EXTRACTION W/ INTRAOCULAR LENS  IMPLANT, BILATERAL Bilateral    CESAREAN SECTION  1971; 1975   INTRAVASCULAR PRESSURE WIRE/FFR STUDY N/A 04/01/2018   Procedure: INTRAVASCULAR PRESSURE WIRE/FFR STUDY;  Surgeon: Nigel Mormon, MD;  Location: [facility];  Service: Cardiovascular;  Laterality: N/A;   LEFT HEART CATH AND CORONARY ANGIOGRAPHY N/A 04/01/2018   Procedure: LEFT HEART CATH AND CORONARY ANGIOGRAPHY;  Surgeon:  Nigel Mormon, MD;  Location: [facility];  Service: Cardiovascular;  Laterality: N/A;   TUBAL LIGATION  1975    Her Family History Is Significant For: Family History  Problem Relation Age of Onset   Diabetes Mellitus II Mother    Osteoporosis Mother    Hypertension Mother    Diabetes Mellitus II Father    CVA Sister        33   CAD Sister        4   Hypertension Sister        4   Osteoarthritis Sister        4   Alcoholism Brother    Breast cancer Cousin 75    Her Social History Is Significant For: Social History   Socioeconomic History   Marital status: Married    Spouse name: Sara Brown   Number of children: 2   Years of education: 16   Highest education level: Bachelor's degree (e.g., BA, AB, BS)  Occupational History    Comment: employed at home  Tobacco Use   Smoking status: Never   Smokeless tobacco: Never  Vaping Use   Vaping Use: Never used  Substance and Sexual Activity   Alcohol use: Never   Drug use: Never   Sexual activity: Not Currently  Other Topics Concern   Not on file  Social History Narrative   Consumes no caffeine   Social Determinants of Health   Financial Resource Strain: Not on file  Food Insecurity: Not on file  Transportation Needs: Not on file  Physical Activity: Not on file  Stress: Not on file  Social Connections: Not on file    Her Allergies Are:  Allergies  Allergen Reactions   Naproxen Other (See Comments)    Patient preference  Other reaction(s): upset stomach   Prednisone Other (See Comments)    Patient Preference  Other reaction(s): upset stomach   Amlodipine Hives   Losartan Rash  :   Her Current Medications Are:  Outpatient Encounter Medications as of 11/29/2020  Medication Sig   ADVAIR DISKUS 100-50 MCG/DOSE AEPB Inhale 1 puff into the lungs 2 (two) times daily.   albuterol (PROVENTIL HFA;VENTOLIN HFA) 108 (90 Base) MCG/ACT inhaler Inhale 1 puff into the lungs every 4 (four) hours as needed for  wheezing.   alendronate (FOSAMAX) 70 MG tablet Take 70 mg by mouth every Saturday.  aspirin EC 81 MG EC tablet Take 1 tablet (81 mg total) by mouth daily. Swallow whole.   aspirin EC 81 MG tablet Take 81 mg by mouth daily.   Calcium Citrate-Vitamin D 315-250 MG-UNIT TABS Take 2 tablets by mouth daily.   carboxymethylcellulose (REFRESH PLUS) 0.5 % SOLN Place 1 drop into both eyes in the morning and at bedtime.   Cholecalciferol (VITAMIN D3) 10 MCG (400 UNIT) CAPS Take 800 Units by mouth daily.   Clobetasol Prop Emollient Base (CLOBETASOL PROPIONATE E) 0.05 % emollient cream Apply 1 application topically 2 (two) times daily as needed. (Patient taking differently: Apply 1 application topically 2 (two) times daily as needed (rash under breast).)   cloNIDine (CATAPRES) 0.1 MG tablet Take 0.1 mg by mouth See admin instructions. Take 1 tablet by mouth every night at bedtime. May take additional 1 tablet if blood pressure is high   clopidogrel (PLAVIX) 75 MG tablet Take 1 tablet (75 mg total) by mouth daily.   Coenzyme Q10 (COQ10) 200 MG CAPS Take 200 mg by mouth daily.   cycloSPORINE (RESTASIS) 0.05 % ophthalmic emulsion Place 1 drop into both eyes 2 (two) times daily.    fluticasone (FLONASE) 50 MCG/ACT nasal spray Place 1 spray into both nostrils in the morning and at bedtime.   Garlic 6578 MG CAPS Take 1,000 mg by mouth daily as needed (high blood pressure).   hydrALAZINE (APRESOLINE) 100 MG tablet Take 100 mg by mouth 3 (three) times daily.   isosorbide dinitrate (ISORDIL) 40 MG tablet Take 1 tablet (40 mg total) by mouth 3 (three) times daily.   levETIRAcetam (KEPPRA) 500 MG tablet Take 1 tablet (500 mg total) by mouth every 12 (twelve) hours.   Magnesium 250 MG TABS Take 250 mg by mouth daily.   Multiple Vitamins-Minerals (MULTIVITAMIN WITH MINERALS) tablet Take 1 tablet by mouth daily. Centrum for Women   omeprazole (PRILOSEC OTC) 20 MG tablet Take 20 mg by mouth daily as needed (for reflux).     Pitavastatin Calcium (LIVALO) 1 MG TABS Take 1 mg by mouth daily.   polyethylene glycol (MIRALAX / GLYCOLAX) packet Take 17 g by mouth daily as needed for moderate constipation.   pyridOXINE (VITAMIN B-6) 100 MG tablet Take 100 mg by mouth daily.    spironolactone (ALDACTONE) 50 MG tablet Take 50 mg by mouth daily.    triamcinolone cream (KENALOG) 0.1 % Apply 1 application topically 2 (two) times daily as needed (rash/ eczema).    No facility-administered encounter medications on file as of 11/29/2020.  :  Review of Systems:  Out of a complete 14 point review of systems, all are reviewed and negative with the exception of these symptoms as listed below:     Review of Systems  Neurological:        Here to discuss worsening sx of leg weakness and tightness in her face. She also notes a correlation with her symptoms with high blood readings.   Objective:  Neurological Exam  Physical Exam Physical Examination:   Vitals:   11/29/20 1015  BP: 121/64  Pulse: 77    General Examination: The patient is a very pleasant 80 y.o. female in no acute distress. She appears well-developed and well-nourished and well groomed.   HEENT: Normocephalic, atraumatic, pupils are equal, round and reactive to light, extraocular tracking is good, face is symmetric, no facial asymmetry noted, no dysarthria, no abnormal tongue movements.  Corrective eyeglasses in place.  Airway examination reveals moderate mouth dryness.  Tongue protrudes centrally and palate elevates symmetrically.  No tongue swelling noted.  No carotid bruits.   Chest: Clear to auscultation without wheezing, rhonchi or crackles noted.   Heart: S1+S2+0, regular and normal without murmurs, rubs or gallops noted.   Abdomen: Soft, non-tender and non-distended.   Extremities: There is no pitting edema in the distal lower extremities bilaterally.   Skin: Warm and dry without trophic changes noted.   Musculoskeletal: exam reveals no obvious  joint deformities.    Neurologically: Mental status: The patient is awake, alert and oriented in all 4 spheres. Her immediate and remote memory, attention, language skills and fund of knowledge are appropriate. There is no evidence of aphasia, agnosia, apraxia or anomia. Speech is clear with normal prosody and enunciation. Thought process is linear. Mood is normal and affect is normal. Cranial nerves II - XII are as described above under HEENT exam. Motor exam: Normal bulk, strength and tone is noted. There is no drift, tremor or rebound. Reflexes are 1+ throughout. Fine motor skills and coordination: grossly intact.  Cerebellar testing: No dysmetria or intention tremor. Sensory exam: intact to light touch. Gait, station and balance: She stands easily. No veering to one side is noted. No leaning to one side is noted. Posture is age-appropriate and stance is narrow based. Gait shows normal stride length and normal pace. No problems turning are noted.  No shuffling, has preserved arm swing.  No walking aid.   Assessment and Plan:    In summary, Sara Brown is an 80 year old right-handed woman with an underlying medical history of allergic rhinitis, carotid artery stenosis, reflux disease, hypertension hyperlipidemia, vitamin D deficiency, paresthesias, diabetes, and osteoarthritis, who presents for follow-up consultation after recent hospitalization for TIA versus seizure activity.  She had work-up in the hospital including multiple imaging tests in June again, and extensive work-up in March 2022 as well. EEG showed mild nonspecific changes, no obvious epileptiform discharges.  She has been on Keppra and continues to take 500 mg twice daily.  She was on baby aspirin and was advised to overlap with Plavix for 3 weeks and then Plavix only thereafter.  She is advised to talk to her primary care physician about continuing with Plavix and ongoing prescription.  Her cardiologist recently increased her  isosorbide to 40 mg 3 times a day.  She has had blood pressure fluctuation and believes that some of her symptoms including her tingling and her headaches tie-in with blood pressure fluctuations. She is advised to continue with Keppra and follow-up routinely as scheduled with Debbora Presto, NP.  She is encouraged to talk to her primary care physician about a referral to another neurologist for second opinion as she has had repeated symptoms and work-up has not revealed any new findings.  She agrees and will talk to her primary care physician.  Neurological exam is stable today.  I answered all her questions today and she and her husband were in agreement.   I spent 30 minutes in total face-to-face time and in reviewing records during pre-charting, more than 50% of which was spent in counseling and coordination of care, reviewing test results, reviewing medications and treatment regimen and/or in discussing or reviewing the diagnosis of TIA, the prognosis and treatment options. Pertinent laboratory and imaging test results that were available during this visit with the patient were reviewed by me and considered in my medical decision making (see chart for details).

## 2020-11-29 NOTE — Patient Instructions (Addendum)
Please continue your medications.  You can follow-up as scheduled with Debbora Presto, NP in November.  It may not be a bad idea to see a different neurologist for second opinion.  Please talk to your primary care about a referral to another neurologist since you have ongoing numbness and tingling and weakness episodes and headaches without obvious single neurological cause found.  Please continue with Plavix after 3 weeks of taking aspirin and Plavix together, you are supposed to maintain on Plavix daily.  Please talk to your primary care about prescribing the Plavix on an ongoing basis.

## 2020-12-04 ENCOUNTER — Telehealth: Payer: Self-pay

## 2020-12-04 NOTE — Telephone Encounter (Signed)
I have reviewed the note. Given her blood pressure fluctuations, Dr. Einar Gip (who spoke to Ms. Roller while on call last week) recommended renal artery duplex. I agree with it.  Staff, please arrange it.  Thanks MJP

## 2020-12-04 NOTE — Telephone Encounter (Signed)
Pt called to ask if you can review her Neuro office visit and let her know your thoughts. Specifically regarding her BP

## 2020-12-05 ENCOUNTER — Other Ambulatory Visit: Payer: Self-pay

## 2020-12-05 DIAGNOSIS — I10 Essential (primary) hypertension: Secondary | ICD-10-CM

## 2020-12-05 DIAGNOSIS — I2510 Atherosclerotic heart disease of native coronary artery without angina pectoris: Secondary | ICD-10-CM

## 2020-12-05 MED ORDER — ISOSORBIDE DINITRATE 20 MG PO TABS
40.0000 mg | ORAL_TABLET | Freq: Three times a day (TID) | ORAL | 2 refills | Status: DC
Start: 2020-12-05 — End: 2020-12-29

## 2020-12-05 NOTE — Telephone Encounter (Signed)
Discussed with pt. Pt reports isordil 40 mg was cost >$500. Pt willing to take 2 tabs of isordil 20 mg TID. Home BP reading of 127/80. Tolerating increased isordil dose without any significant changes in pt's chronic complains. Still experiencing tingling and numbness symptoms that significantly affect pt's ADLs and iADLs. Recent neurology OV notes reviewed with pt. Pt looking to follow up with PCP to review need for second opinion if needed. Pt reports symptoms present regardless of her home BP. Symptoms present when BP reading is at goal, slightly elevated, or slightly below goal. Avg BP at home over the past month of 133/73. Reviewed the importance of staying on consistent dosing schedule and not to chase her BP management whenever she has symptom flare-ups. Reviewed distraction techniques and meditation/deep breathing tips to help with managing her chronic difficulties with being able to identify her triggers and being able to address her underlying chronic symptoms. Reviewed the benefits of possibly reviewing with another neurologist to see if they could provide fresh insight into her symptoms and possibly identify mitigating agents.

## 2020-12-05 NOTE — Telephone Encounter (Signed)
Sara Brown, [facility] spoke to the patient.

## 2020-12-07 ENCOUNTER — Other Ambulatory Visit: Payer: Self-pay

## 2020-12-07 DIAGNOSIS — I10 Essential (primary) hypertension: Secondary | ICD-10-CM

## 2020-12-07 MED ORDER — CLONIDINE HCL 0.1 MG PO TABS: 0.1000 mg | ORAL_TABLET | ORAL | 3 refills | Status: DC

## 2020-12-08 ENCOUNTER — Ambulatory Visit: Payer: PRIVATE HEALTH INSURANCE | Admitting: Podiatry

## 2020-12-12 DIAGNOSIS — H9113 Presbycusis, bilateral: Secondary | ICD-10-CM | POA: Diagnosis not present

## 2020-12-14 ENCOUNTER — Ambulatory Visit: Payer: Medicare Other

## 2020-12-14 ENCOUNTER — Other Ambulatory Visit: Payer: Self-pay

## 2020-12-14 DIAGNOSIS — I700 Atherosclerosis of aorta: Secondary | ICD-10-CM

## 2020-12-14 DIAGNOSIS — I10 Essential (primary) hypertension: Secondary | ICD-10-CM | POA: Diagnosis not present

## 2020-12-15 ENCOUNTER — Ambulatory Visit: Payer: Medicare Other

## 2020-12-25 ENCOUNTER — Ambulatory Visit: Payer: PRIVATE HEALTH INSURANCE | Admitting: Cardiology

## 2020-12-29 ENCOUNTER — Encounter: Payer: Self-pay | Admitting: Cardiology

## 2020-12-29 ENCOUNTER — Other Ambulatory Visit: Payer: Self-pay

## 2020-12-29 ENCOUNTER — Ambulatory Visit: Payer: Medicare Other | Admitting: Cardiology

## 2020-12-29 VITALS — BP 116/67 | HR 68 | Temp 98.0°F | Resp 16 | Ht 66.0 in | Wt 130.0 lb

## 2020-12-29 DIAGNOSIS — I10 Essential (primary) hypertension: Secondary | ICD-10-CM

## 2020-12-29 DIAGNOSIS — I2510 Atherosclerotic heart disease of native coronary artery without angina pectoris: Secondary | ICD-10-CM

## 2020-12-29 DIAGNOSIS — E782 Mixed hyperlipidemia: Secondary | ICD-10-CM

## 2020-12-29 HISTORY — DX: Mixed hyperlipidemia: E78.2

## 2020-12-29 MED ORDER — ISOSORBIDE DINITRATE 20 MG PO TABS: 40.0000 mg | ORAL_TABLET | Freq: Two times a day (BID) | ORAL | 0 refills | Status: DC

## 2020-12-29 NOTE — Progress Notes (Signed)
Follow up visit  Subjective:   Sara Brown, female    DOB: [birth year], 80 y.o.   MRN: [id number]   Chief Complaint  Patient presents with   Hypertension   Follow-up    3 month   Shortness of Breath    80 y/o African-American female with hypertension, diet controlled diabetes, hyperlipidemia,  GERD and remote history of TIA, mild nonobstructive coronary artery disease (Cath 04/01/2018).  Patient continues to be extremely bloated about her blood pressure numbers.  She almost gets stressed out looking at numbers and tolerates her cycle.  I reviewed her home blood pressure readings, which are average 126/70 mmHg.  She has also been seeing neurologist for rather intractable symptoms without any significant abnormalities on the work-up.  Current Outpatient Medications on File Prior to Visit  Medication Sig Dispense Refill   ADVAIR DISKUS 100-50 MCG/DOSE AEPB Inhale 1 puff into the lungs 2 (two) times daily.     albuterol (PROVENTIL HFA;VENTOLIN HFA) 108 (90 Base) MCG/ACT inhaler Inhale 1 puff into the lungs every 4 (four) hours as needed for wheezing.     alendronate (FOSAMAX) 70 MG tablet Take 70 mg by mouth every Saturday.     aspirin EC 81 MG EC tablet Take 1 tablet (81 mg total) by mouth daily. Swallow whole. 21 tablet 11   Calcium Citrate-Vitamin D 315-250 MG-UNIT TABS Take 2 tablets by mouth daily.     carboxymethylcellulose (REFRESH PLUS) 0.5 % SOLN Place 1 drop into both eyes in the morning and at bedtime.     Cholecalciferol (VITAMIN D3) 10 MCG (400 UNIT) CAPS Take 800 Units by mouth daily.     Clobetasol Prop Emollient Base (CLOBETASOL PROPIONATE E) 0.05 % emollient cream Apply 1 application topically 2 (two) times daily as needed. (Patient taking differently: Apply 1 application topically 2 (two) times daily as needed (rash under breast).) 60 g 6   cloNIDine (CATAPRES) 0.1 MG tablet Take 1 tablet (0.1 mg total) by mouth See admin instructions. Take 1 tablet by mouth  every night at bedtime. May take additional 1 tablet if blood pressure is high 60 tablet 3   Coenzyme Q10 (COQ10) 200 MG CAPS Take 200 mg by mouth daily.     cycloSPORINE (RESTASIS) 0.05 % ophthalmic emulsion Place 1 drop into both eyes 2 (two) times daily.      fluticasone (FLONASE) 50 MCG/ACT nasal spray Place 1 spray into both nostrils in the morning and at bedtime.     Garlic 2778 MG CAPS Take 1,000 mg by mouth daily as needed (high blood pressure).     hydrALAZINE (APRESOLINE) 100 MG tablet Take 100 mg by mouth 3 (three) times daily.     isosorbide dinitrate (ISORDIL) 20 MG tablet Take 2 tablets (40 mg total) by mouth 3 (three) times daily. 180 tablet 2   levETIRAcetam (KEPPRA) 500 MG tablet Take 1 tablet (500 mg total) by mouth every 12 (twelve) hours. 60 tablet 5   Magnesium 250 MG TABS Take 250 mg by mouth daily.     Multiple Vitamins-Minerals (MULTIVITAMIN WITH MINERALS) tablet Take 1 tablet by mouth daily. Centrum for Women     omeprazole (PRILOSEC OTC) 20 MG tablet Take 20 mg by mouth daily as needed (for reflux).      Pitavastatin Calcium (LIVALO) 1 MG TABS Take 1 mg by mouth daily.     polyethylene glycol (MIRALAX / GLYCOLAX) packet Take 17 g by mouth daily as needed for moderate constipation.  pyridOXINE (VITAMIN B-6) 100 MG tablet Take 100 mg by mouth daily.      spironolactone (ALDACTONE) 50 MG tablet Take 50 mg by mouth daily.      triamcinolone cream (KENALOG) 0.1 % Apply 1 application topically 2 (two) times daily as needed (rash/ eczema).   2   No current facility-administered medications on file prior to visit.    Cardiovascular & other pertient studies:  Renal artery duplex  12/14/2020:  No evidence of renal artery occlusive disease in either renal artery.  Normal intrarenal vascular perfusion is noted in both kidneys.  Renal length is within normal limits for both kidneys.  Diffuse plaque observed in the abdominal aorta. Normal abdominal aorta  flow velocities  noted.  EKG 07/12/2020: Sinus rhythm 65 bpm Low voltage in precordial leads Cannot exclude old anteroseptal infarct  Coronary angiogram 04/01/2018: LM: Normal LAD: Prox-mid 40% stenosis. iFR 0.99 (Physiologically nonsignificant) LCx: Dominant. Small branches with mild disease RCA: Nondominant. Normal   LVEDP mildly elevated.   Carotid artery duplex 02/23/2018: No hemodynamically significant arterial disease in the internal carotid artery bilaterally. No significant plaque burden. Antegrade right vertebral artery flow. Antegrade left vertebral artery flow.  Echocardiogram 10/29/2017: Left ventricle cavity is normal in size. Mild concentric hypertrophy of the left ventricle. Normal global wall motion. Normal diastolic filling pattern. Calculated EF 69%. No significant valvular abnormality.  Insignificant pericardial effusion.  Exercise myoview stress 10/20/2017: 1. The resting electrocardiogram demonstrated normal sinus rhythm, normal resting conduction and no resting arrhythmias.  The stress electrocardiogram was normal.  Patient exercised on Bruce protocol for 7:43 minutes and achieved 8.54 METS. Stress test terminated due to mid sternal chest pain and 90% % MPHR achieved (Target HR >85%). Normal BP. 2.  Myocardial perfusion imaging is normal. Overall left ventricular systolic function was normal without regional wall motion abnormalities. The left ventricular ejection fraction was 76%.  This is a low risk study.  Recent labs: 11/13/2020: Glucose 144, BUN/Cr 16/0.8. EGFR >60. Na/K 139/4.0. Rest of the CMP normal Chol 180, TG 85, HDL 62, LDL 101  05/26/2020: Glucose 115, BUN/Cr 16/1.19. EGFR 46. Na/K 139/4.0 . Rest of the CMP normal H/H 12/36. MCV 88. Platelets 237  10/11/2019: Glucose 123, BUN/Cr 15/0.86. EGFR 60. Na/K 136/4.8. Rest of the CMP normal H/H 12.8/40. MCV 91.5. Platelets 252.  09/20/2019: A1C 6.3% Chol 185, TG 168, HDL 60, LDL 96.  12/31/2018: TSH 1.710   Review  of Systems  Cardiovascular:  Negative for chest pain, dyspnea on exertion, leg swelling, palpitations and syncope.  Neurological:  Positive for headaches and paresthesias.  Psychiatric/Behavioral:  The patient is nervous/anxious.    Unchanged compared to previous visit     Vitals:   12/29/20 0942  BP: 116/67  Pulse: 68  Resp: 16  Temp: 98 F (36.7 C)  SpO2: 98%      Body mass index is 20.98 kg/m. Filed Weights   12/29/20 0942  Weight: 130 lb (59 kg)     Objective:   Physical Exam Vitals and nursing note reviewed.  Constitutional:      Appearance: She is well-developed.  Neck:     Vascular: No JVD.  Cardiovascular:     Rate and Rhythm: Normal rate and regular rhythm.     Pulses: Intact distal pulses.     Heart sounds: Normal heart sounds. No murmur heard. Pulmonary:     Effort: Pulmonary effort is normal.     Breath sounds: Normal breath sounds. No wheezing or rales.  Musculoskeletal:     Right lower leg: No edema.     Left lower leg: No edema.   Unchanged compared to previous visit     Assessment & Recommendations:   80 y/o African-American female with hypertension, diet controlled diabetes, hyperlipidemia,  GERD and remote history of TIA, mild nonobstructive coronary artery disease (Cath 04/01/2018).   Hypertension: Overall, very well controlled. She is worried about occasional low blood pressure numbers.   I reduced her Isordil from 40 mg twice daily to 3 times daily. Continue hydralazine 100 mg tid, spironolactone 50 mg daily, clonidine 0.1 mg bid I tried to allay her anxiety to my best, encouraged her not to get overtly worried about checking blood pressures regularly, since they have been stable.  Coronary artery disease involving native coronary artery of native heart without angina pectoris Continue risk factor modification with BP and lipid lowering therapy. EKG shows no ischemia.  Continue aspirin 81 mg daily, pitvastatin. She has not tolerated  other statins in the past.  LDL remains greater than 100.  I have suggested her to consider Inclisiran, once every 74-monthinjections.  F/u in 3 months  MAltenburg MD [facility]. [facility] Pager: [phone]: [phone]

## 2021-01-08 ENCOUNTER — Telehealth: Payer: Self-pay | Admitting: Pharmacist

## 2021-01-08 DIAGNOSIS — E782 Mixed hyperlipidemia: Secondary | ICD-10-CM

## 2021-01-08 DIAGNOSIS — I2510 Atherosclerotic heart disease of native coronary artery without angina pectoris: Secondary | ICD-10-CM

## 2021-01-08 NOTE — Telephone Encounter (Signed)
Benefit check for Leqvio completed and pt is covered for Leqvio. Pt has a $233 primary and $50 secondary deductible limits met for the calendar year. Called and discussed with pt. Pt agreeable to initiate Leqvio for LDL reduction and ASCVD risk reduction. Pt referred to alternative injection center through [facility] [place] [facility]. Referral application faxed over

## 2021-01-12 DIAGNOSIS — I10 Essential (primary) hypertension: Secondary | ICD-10-CM | POA: Diagnosis not present

## 2021-01-17 MED ORDER — LIVALO 2 MG PO TABS: 1.0000 | ORAL_TABLET | Freq: Every day | ORAL | 3 refills | Status: DC

## 2021-01-17 NOTE — Addendum Note (Signed)
Addended by: Manuela Schwartz T on: 01/17/2021 01:21 PM   Modules accepted: Orders

## 2021-01-17 NOTE — Addendum Note (Signed)
Addended by: Manuela Schwartz T on: 01/17/2021 11:11 AM   Modules accepted: Orders

## 2021-01-17 NOTE — Addendum Note (Signed)
Addended by: Nigel Mormon on: 01/17/2021 01:24 PM   Modules accepted: Orders

## 2021-01-17 NOTE — Telephone Encounter (Signed)
Pt called back stating that since reviewing the side effect profile of Leqvio, pt was hesitant to pursue Leqvio injections. Pt's main concerns include possible increased risk of rash, arthralgia and worsening bronchitis risk. Reviewed clinical trial data with pt in great detail including comparing clinical trial results with placebo. Reviewed the risk of potential side effects compared to potential benefit of LDL lowering benefits. Pt reports that she already has current concerns of potential rash and arthralgia concerns and is worried that she doesn't further aggravate those symptoms.  Pt would like to increase her livalo dose and addressing her diet and lifestyle to get to her LDL goal of <70.

## 2021-01-24 DIAGNOSIS — H6522 Chronic serous otitis media, left ear: Secondary | ICD-10-CM

## 2021-01-24 DIAGNOSIS — H90A32 Mixed conductive and sensorineural hearing loss, unilateral, left ear with restricted hearing on the contralateral side: Secondary | ICD-10-CM

## 2021-01-24 DIAGNOSIS — H90A21 Sensorineural hearing loss, unilateral, right ear, with restricted hearing on the contralateral side: Secondary | ICD-10-CM | POA: Diagnosis not present

## 2021-01-24 HISTORY — DX: Mixed conductive and sensorineural hearing loss, unilateral, left ear with restricted hearing on the contralateral side: H90.A32

## 2021-01-24 HISTORY — DX: Chronic serous otitis media, left ear: H65.22

## 2021-02-08 ENCOUNTER — Telehealth: Payer: Self-pay | Admitting: Neurology

## 2021-02-08 DIAGNOSIS — R9401 Abnormal electroencephalogram [EEG]: Secondary | ICD-10-CM | POA: Diagnosis not present

## 2021-02-08 DIAGNOSIS — Z79899 Other long term (current) drug therapy: Secondary | ICD-10-CM | POA: Diagnosis not present

## 2021-02-08 DIAGNOSIS — G459 Transient cerebral ischemic attack, unspecified: Secondary | ICD-10-CM | POA: Diagnosis not present

## 2021-02-08 DIAGNOSIS — R519 Headache, unspecified: Secondary | ICD-10-CM | POA: Diagnosis not present

## 2021-02-08 DIAGNOSIS — E7800 Pure hypercholesterolemia, unspecified: Secondary | ICD-10-CM | POA: Diagnosis not present

## 2021-02-08 NOTE — Telephone Encounter (Signed)
Pt is asking for a call re: her no longer being able to get refills  on plavix.

## 2021-02-08 NOTE — Telephone Encounter (Signed)
I spoke with the patient.  She states she has been taking aspirin 81 mg daily and she stopped the Plavix when she ran out.  She says she spoke with her primary care PA today while she was there for another issue.  She stated the PA prescribed Plavix but told her to call and double check with Dr. Rexene Alberts before she fills it.  I spoke with Dr. Rexene Alberts and we reviewed the discharge summary and recommendations by neurology from patient's hospitalization which indicated patient should take both aspirin and Plavix daily for 3 weeks followed by Plavix alone.  I spoke with the patient and provided the recommendations.  The patient verbalized understanding.  She will stop the aspirin when she fills Plavix 75 mg to take daily instead.  She was advised to discuss receiving her ongoing refills from primary care.  The patient verbalized appreciation and her questions were answered.

## 2021-02-09 ENCOUNTER — Encounter: Payer: Self-pay | Admitting: Neurology

## 2021-02-12 DIAGNOSIS — Z20822 Contact with and (suspected) exposure to covid-19: Secondary | ICD-10-CM | POA: Diagnosis not present

## 2021-02-13 ENCOUNTER — Telehealth: Payer: Self-pay | Admitting: Cardiology

## 2021-02-13 NOTE — Telephone Encounter (Signed)
Pt called and teny spoke to her

## 2021-03-01 ENCOUNTER — Other Ambulatory Visit: Payer: Self-pay

## 2021-03-01 ENCOUNTER — Ambulatory Visit (INDEPENDENT_AMBULATORY_CARE_PROVIDER_SITE_OTHER): Payer: Medicare Other | Admitting: Neurology

## 2021-03-01 ENCOUNTER — Encounter: Payer: Self-pay | Admitting: Neurology

## 2021-03-01 VITALS — BP 132/68 | HR 80 | Ht 66.5 in | Wt 124.6 lb

## 2021-03-01 DIAGNOSIS — I2510 Atherosclerotic heart disease of native coronary artery without angina pectoris: Secondary | ICD-10-CM | POA: Diagnosis not present

## 2021-03-01 DIAGNOSIS — R413 Other amnesia: Secondary | ICD-10-CM

## 2021-03-01 DIAGNOSIS — H6522 Chronic serous otitis media, left ear: Secondary | ICD-10-CM | POA: Diagnosis not present

## 2021-03-01 DIAGNOSIS — R9401 Abnormal electroencephalogram [EEG]: Secondary | ICD-10-CM

## 2021-03-01 DIAGNOSIS — R29818 Other symptoms and signs involving the nervous system: Secondary | ICD-10-CM | POA: Diagnosis not present

## 2021-03-01 HISTORY — PX: TYMPANOSTOMY TUBE PLACEMENT: SHX32

## 2021-03-01 NOTE — Patient Instructions (Signed)
Good to meet you!  Schedule 24-hour EEG  2. Continue Keppra 500mg  twice a day for now  3. Continue daily Clopidogrel 75mg  tablet  4. Schedule Neurocognitive testing  5. Follow-up after EEG, call for any changes   You have been referred for a neuropsychological evaluation (i.e., evaluation of memory and thinking abilities). Please bring someone with you to this appointment if possible, as it is helpful for the doctor to hear from both you and another adult who knows you well. Please bring eyeglasses and hearing aids if you wear them.    The evaluation will take approximately 3 hours and has two parts:   The first part is a clinical interview with the neuropsychologist (Dr. Melvyn Novas or Dr. Nicole Kindred). During the interview, the neuropsychologist will speak with you and the individual you brought to the appointment.    The second part of the evaluation is testing with the doctor's technician Hinton Dyer or Maudie Mercury). During the testing, the technician will ask you to remember different types of material, solve problems, and answer some questionnaires. Your family member will not be present for this portion of the evaluation.   Please note: We must reserve several hours of the neuropsychologist's time and the psychometrician's time for your evaluation appointment. As such, there is a No-Show fee of $100. If you are unable to attend any of your appointments, please contact our office as soon as possible to reschedule.

## 2021-03-01 NOTE — Progress Notes (Signed)
NEUROLOGY CONSULTATION NOTE  Shawna Wearing MRN: [id number] DOB: 1940/09/23  Referring provider: Dr. Seward Carol Primary care provider: Dr. Seward Carol  Reason for consult:  second opinion regarding seizures  Dear Dr Delfina Redwood:  Thank you for your kind referral of Amerah Puleo for consultation of the above symptoms. Although her history is well known to you, please allow me to reiterate it for the purpose of our medical record. The patient was accompanied to the clinic by her husband who also provides collateral information. Records and images were personally reviewed where available.   HISTORY OF PRESENT ILLNESS: This is an 80 year old right-handed woman with a history of hypertension, hyperlipidemia, diabetes, neuropathy, left carotid stenosis, presenting for second opinion regarding seizures. She had been seeing neurologist Dr. Rexene Alberts since 2016 for several year history of intermittent abnormal sensations in her right face, right arm, pulling sensation in right arm and shoulders. Work-up in 2016 showed a normal EEG and unremarkable brain MRI. She was in the ER in 02/2018 for facial paresthesias and discomfort, at that time she reported symptoms on the left arm and face, as well as tongue. Head CT no acute changes. He had a repeat brain MRI in 1/202 with no acute changes, mild chronic microvascular disease. She was in the ER in 08/2020 due to elevated BP, she reported symptoms on the left side of her face, lip, and tongue. She also felt an abnormal sensation in her left leg like something was moving, making her feel as though she could not walk, bu tin the ER, she reported it may have occurred on the right leg but not as pronounced on the left. MRI brain and MRA head and neck in 08/2020 did not show any acute changes or significant stenosis. EEG in 08/2020 reported focal slowing over the left temporal region. She was discharged home on Levetiracetam 500mg  BID. She was back in the  ER on 11/13/20 for sudden-onset right-sided numbness involving the forehead, face, lip, arm and leg. She also reported clumsiness and heaviness in the right leg. Another brain MRI done did not show any acute changes, MRA no significant stenosis. EEG again reported left temporal slowing. Levetiracetam was continued and she was advised to take DAPT with aspirin and Plavix for 3 weeks, the Plavix thereafter.   She also notes that when her BP goes too high or low, she gets symptoms on the side of her face. That day in June, she felt it on the right side, with tightening or heaviness in her tongue and lip, no physical changes visible to her husband. Her husband denies any staring/unresponsive episodes. She states her tongue is heavy right now because her BP is low. She had also been reporting headaches to Dr. Rexene Alberts. She states the pain on the top her her head has not been recent, she cannot recall the last time she had a headache. She states it is not a headache but a stabbing kind of pain. She feels dizzy when her BP is off. She is asking about her memory, she reports being tested and score was 23/30. She is usually pretty good with remembering her medications. She uses the computer frequently and denies forgetting passwords. She has not been driving, but was not getting lost when she drove. She "probably does not sleep enough."  Her brother had epilepsy. Otherwise she had a normal birth and early development.  There is no history of febrile convulsions, CNS infections such as meningitis/encephalitis, significant traumatic brain injury,  neurosurgical procedures.    Lab Results  Component Value Date   TSH 3.258 09/05/2020   No results found for: VITAMINB12  Lab Results  Component Value Date   HGBA1C 5.9 (H) 11/14/2020    PAST MEDICAL HISTORY: Past Medical History:  Diagnosis Date   Allergic rhinitis    Arthritis    "fingers, knees" (02/09/2018)   Asthma    "stopped taking RX after dr said I don't have  this" (02/09/2018)   Carotid stenosis    ICA(L)   Complication of anesthesia    "felt the cut w/one of my c-sections" (02/09/2018)   Constipation    GERD (gastroesophageal reflux disease)    Headache    "years ago; before menopause" (02/09/2018)   Hyperlipidemia    Hypertension    Neuropathy    face (R)   OA (osteoarthritis)    Osteoporosis    TIA (transient ischemic attack) ?2005   Type II diabetes mellitus (HCC)    Vitamin D deficiency     PAST SURGICAL HISTORY: Past Surgical History:  Procedure Laterality Date   CATARACT EXTRACTION W/ INTRAOCULAR LENS  IMPLANT, BILATERAL Bilateral    CESAREAN SECTION  1971; 1975   INTRAVASCULAR PRESSURE WIRE/FFR STUDY N/A 04/01/2018   Procedure: INTRAVASCULAR PRESSURE WIRE/FFR STUDY;  Surgeon: Nigel Mormon, MD;  Location: [facility];  Service: Cardiovascular;  Laterality: N/A;   LEFT HEART CATH AND CORONARY ANGIOGRAPHY N/A 04/01/2018   Procedure: LEFT HEART CATH AND CORONARY ANGIOGRAPHY;  Surgeon: Nigel Mormon, MD;  Location: [facility];  Service: Cardiovascular;  Laterality: N/A;   TUBAL LIGATION  1975   TYMPANOSTOMY TUBE PLACEMENT Left 03/01/2021    MEDICATIONS: Current Outpatient Medications on File Prior to Visit  Medication Sig Dispense Refill   ADVAIR DISKUS 100-50 MCG/DOSE AEPB Inhale 1 puff into the lungs 2 (two) times daily.     albuterol (PROVENTIL HFA;VENTOLIN HFA) 108 (90 Base) MCG/ACT inhaler Inhale 1 puff into the lungs every 4 (four) hours as needed for wheezing.     alendronate (FOSAMAX) 70 MG tablet Take 70 mg by mouth every Saturday.     aspirin EC 81 MG EC tablet Take 1 tablet (81 mg total) by mouth daily. Swallow whole. 21 tablet 11   Calcium Citrate-Vitamin D 315-250 MG-UNIT TABS Take 2 tablets by mouth daily.     carboxymethylcellulose (REFRESH PLUS) 0.5 % SOLN Place 1 drop into both eyes in the morning and at bedtime.     Cholecalciferol (VITAMIN D3) 10 MCG (400 UNIT) CAPS Take 800 Units by  mouth daily.     Clobetasol Prop Emollient Base (CLOBETASOL PROPIONATE E) 0.05 % emollient cream Apply 1 application topically 2 (two) times daily as needed. (Patient taking differently: Apply 1 application topically 2 (two) times daily as needed (rash under breast).) 60 g 6   cloNIDine (CATAPRES) 0.1 MG tablet Take 1 tablet (0.1 mg total) by mouth See admin instructions. Take 1 tablet by mouth every night at bedtime. May take additional 1 tablet if blood pressure is high 60 tablet 3   Clopidogrel Bisulfate (PLAVIX PO) Take by mouth daily.     Coenzyme Q10 (COQ10) 200 MG CAPS Take 200 mg by mouth daily.     cycloSPORINE (RESTASIS) 0.05 % ophthalmic emulsion Place 1 drop into both eyes 2 (two) times daily.      fluticasone (FLONASE) 50 MCG/ACT nasal spray Place 1 spray into both nostrils in the morning and at bedtime.  Garlic 0923 MG CAPS Take 1,000 mg by mouth daily as needed (high blood pressure).     hydrALAZINE (APRESOLINE) 100 MG tablet Take 100 mg by mouth 3 (three) times daily.     isosorbide dinitrate (ISORDIL) 20 MG tablet Take 2 tablets (40 mg total) by mouth 2 (two) times daily. 1 tablet 0   levETIRAcetam (KEPPRA) 500 MG tablet Take 1 tablet (500 mg total) by mouth every 12 (twelve) hours. 60 tablet 5   Magnesium 250 MG TABS Take 250 mg by mouth daily.     Multiple Vitamins-Minerals (MULTIVITAMIN WITH MINERALS) tablet Take 1 tablet by mouth daily. Centrum for Women     omeprazole (PRILOSEC OTC) 20 MG tablet Take 20 mg by mouth daily as needed (for reflux).      Pitavastatin Calcium (LIVALO) 2 MG TABS Take 1 tablet (2 mg total) by mouth daily. 30 tablet 3   polyethylene glycol (MIRALAX / GLYCOLAX) packet Take 17 g by mouth daily as needed for moderate constipation.     pyridOXINE (VITAMIN B-6) 100 MG tablet Take 100 mg by mouth daily.      spironolactone (ALDACTONE) 50 MG tablet Take 50 mg by mouth daily.      triamcinolone cream (KENALOG) 0.1 % Apply 1 application topically 2 (two)  times daily as needed (rash/ eczema).   2   No current facility-administered medications on file prior to visit.    ALLERGIES: Allergies  Allergen Reactions   Naproxen Other (See Comments)    Patient preference  Other reaction(s): upset stomach   Prednisone Other (See Comments)    Patient Preference  Other reaction(s): upset stomach   Amlodipine Hives   Losartan Rash    FAMILY HISTORY: Family History  Problem Relation Age of Onset   Diabetes Mellitus II Mother    Osteoporosis Mother    Hypertension Mother    Diabetes Mellitus II Father    CVA Sister        39   CAD Sister        4   Hypertension Sister        4   Osteoarthritis Sister        4   Alcoholism Brother    Breast cancer Cousin 22    SOCIAL HISTORY: Social History   Socioeconomic History   Marital status: Married    Spouse name: Bland Span   Number of children: 2   Years of education: 16   Highest education level: Bachelor's degree (e.g., BA, AB, BS)  Occupational History    Comment: employed at home  Tobacco Use   Smoking status: Never   Smokeless tobacco: Never  Vaping Use   Vaping Use: Never used  Substance and Sexual Activity   Alcohol use: Never   Drug use: Never   Sexual activity: Not Currently  Other Topics Concern   Not on file  Social History Narrative   Consumes no caffeine   Right handed    Social Determinants of Health   Financial Resource Strain: Not on file  Food Insecurity: Not on file  Transportation Needs: Not on file  Physical Activity: Not on file  Stress: Not on file  Social Connections: Not on file  Intimate Partner Violence: Not on file     PHYSICAL EXAM: Vitals:   03/01/21 1347  BP: 132/68  Pulse: 80  SpO2: 98%   General: No acute distress Head:  Normocephalic/atraumatic Skin/Extremities: No rash, no edema Neurological Exam: Mental status: alert and oriented to person,  place, and time, no dysarthria or aphasia, Fund of knowledge is appropriate.  Recent  and remote memory are intact.  Attention and concentration are normal.    Cranial nerves: CN I: not tested CN II: pupils equal, round and reactive to light, visual fields intact CN III, IV, VI:  full range of motion, no nystagmus, no ptosis CN V: facial sensation intact CN VII: upper and lower face symmetric CN VIII: hearing intact to conversation Bulk & Tone: normal, no fasciculations. Motor: 5/5 throughout with no pronator drift. Sensation: intact to light touch, cold, pin, vibration and joint position sense.  No extinction to double simultaneous stimulation.  Romberg test negative Deep Tendon Reflexes: +1 throughout Cerebellar: no incoordination on finger to nose testing Gait: narrow-based and steady, no ataxia Tremor: none   IMPRESSION: This is an 80 year old right-handed woman with a history of hypertension, hyperlipidemia, diabetes, neuropathy, left carotid stenosis, presenting for second opinion regarding seizures. She has had recurrent intermittent episodes of paresthesias on one side of her body, initially reporting symptoms on the right side, but other times on the left. She has had 2 routine EEGs showing focal delta slowing over the left temporal region. Repeat brain MRIs have not shown any acute changes, no structural abnormality on the left temporal region. She was started empirically on Levetiracetam 500mg  BID on hospital admission for left-sided symptoms in 08/2020. It is unclear if these episodes represent seizures, she had another hospital admission in June for right-sided symptoms. She feels these are related to her blood pressure. We discussed doing a 24-hour EEG to assess for focal abnormalities that increase risk for recurrent seizures. Continue Levetiracetam 500mg  BID for now. She is also on Plavix 75mg  daily for possible TIA. Continue control of vascular risk factors. She raised concerns for her memory and will be scheduled for Neurocognitive testing. Follow-up after EEG, they  know to call for any changes.    Thank you for allowing me to participate in the care of this patient. Please do not hesitate to call for any questions or concerns.   Ellouise Newer, M.D.  CC: Dr. Delfina Redwood

## 2021-03-02 ENCOUNTER — Other Ambulatory Visit: Payer: Self-pay | Admitting: Cardiology

## 2021-03-02 DIAGNOSIS — I2510 Atherosclerotic heart disease of native coronary artery without angina pectoris: Secondary | ICD-10-CM

## 2021-03-02 DIAGNOSIS — I10 Essential (primary) hypertension: Secondary | ICD-10-CM

## 2021-03-07 ENCOUNTER — Other Ambulatory Visit: Payer: Self-pay | Admitting: Cardiology

## 2021-03-07 DIAGNOSIS — I10 Essential (primary) hypertension: Secondary | ICD-10-CM

## 2021-03-08 ENCOUNTER — Encounter: Payer: Self-pay | Admitting: Pharmacist

## 2021-03-08 DIAGNOSIS — I2510 Atherosclerotic heart disease of native coronary artery without angina pectoris: Secondary | ICD-10-CM

## 2021-03-08 DIAGNOSIS — I10 Essential (primary) hypertension: Secondary | ICD-10-CM

## 2021-03-09 ENCOUNTER — Other Ambulatory Visit: Payer: Medicare Other

## 2021-03-09 ENCOUNTER — Encounter: Payer: Self-pay | Admitting: Podiatry

## 2021-03-09 ENCOUNTER — Other Ambulatory Visit: Payer: Self-pay

## 2021-03-09 ENCOUNTER — Ambulatory Visit (INDEPENDENT_AMBULATORY_CARE_PROVIDER_SITE_OTHER): Payer: Medicare Other | Admitting: Podiatry

## 2021-03-09 DIAGNOSIS — M79675 Pain in left toe(s): Secondary | ICD-10-CM

## 2021-03-09 DIAGNOSIS — E1165 Type 2 diabetes mellitus with hyperglycemia: Secondary | ICD-10-CM

## 2021-03-09 DIAGNOSIS — M792 Neuralgia and neuritis, unspecified: Secondary | ICD-10-CM | POA: Insufficient documentation

## 2021-03-09 DIAGNOSIS — L84 Corns and callosities: Secondary | ICD-10-CM

## 2021-03-09 DIAGNOSIS — B351 Tinea unguium: Secondary | ICD-10-CM

## 2021-03-09 DIAGNOSIS — E1142 Type 2 diabetes mellitus with diabetic polyneuropathy: Secondary | ICD-10-CM

## 2021-03-09 DIAGNOSIS — M79674 Pain in right toe(s): Secondary | ICD-10-CM | POA: Diagnosis not present

## 2021-03-09 HISTORY — DX: Type 2 diabetes mellitus with hyperglycemia: E11.65

## 2021-03-09 HISTORY — DX: Neuralgia and neuritis, unspecified: M79.2

## 2021-03-09 NOTE — Progress Notes (Signed)
Subjective: Sara Brown presents today for follow up of preventative diabetic foot care, painful mycotic nails b/l that are difficult to trim. Pain interferes with ambulation. Aggravating factors include wearing enclosed shoe gear. Pain is relieved with periodic professional debridement and painful plantar lesions b/l feet.  Pain prevent comfortable ambulation. Aggravating factor is weightbearing with or without shoegear.   She states she is still having blood pressure issues and is working with her doctor.  PCP is Dr. Seward Carol and last visit was 10/04/2020.  Allergies  Allergen Reactions   Naproxen Other (See Comments)    Patient preference  Other reaction(s): upset stomach   Prednisone Other (See Comments)    Patient Preference  Other reaction(s): upset stomach   Amlodipine Hives   Losartan Rash     Objective: There were no vitals filed for this visit.  Pt is a pleasant 80 y.o. year old AA female WD, WN in NAD. AAO x 3.   Vascular Examination:  Capillary refill time to digits immediate b/l. Palpable DP pulses b/l. Palpable PT pulses b/l. Pedal hair absent b/l Skin temperature gradient within normal limits b/l. No edema noted b/l. No pain with calf compression b/l.  Dermatological Examination: Pedal skin with normal turgor, texture and tone b/l lower extremities. No open wounds b/l lower extremities. No interdigital macerations b/l lower extremities. Toenails 1-5 b/l elongated, discolored, dystrophic, thickened, crumbly with subungual debris and tenderness to dorsal palpation. Hyperkeratotic lesion(s) L 2nd toe, submet head 1 left foot, submet head 1 right foot, and submet head 2 right foot.  No erythema, no edema, no drainage, no fluctuance.  Musculoskeletal: Normal muscle strength 5/5 to all lower extremity muscle groups bilaterally. No pain crepitus or joint limitation noted with ROM b/l. Hammertoes noted to the 2-5 bilaterally.  Neurological: Protective  sensation diminished with 10g monofilament b/l. Vibratory sensation intact b/l.  Assessment: 1. Pain due to onychomycosis of toenails of both feet   2. Corns and callosities   3. Diabetic peripheral neuropathy associated with type 2 diabetes mellitus ([place])    Plan: -Examined patient. -Patient to continue soft, supportive shoe gear daily. -Toenails 1-5 b/l were debrided in length and girth with sterile nail nippers and dremel without iatrogenic bleeding.  -Corn(s) L 2nd toe pared utilizing sterile scalpel blade without complication or incident. Total number debrided=1. -Callus(es) submet head 1, 2 right foot  and submet head 1 left foot pared utilizing sterile scalpel blade without complication or incident. Total number debrided =3. -Patient to continue soft, supportive shoe gear daily. -Patient to report any pedal injuries to medical professional immediately. -Patient/POA to call should there be question/concern in the interim.  Return in about 3 months (around 06/08/2021).

## 2021-03-11 ENCOUNTER — Encounter: Payer: Self-pay | Admitting: Neurology

## 2021-03-14 ENCOUNTER — Ambulatory Visit (INDEPENDENT_AMBULATORY_CARE_PROVIDER_SITE_OTHER): Payer: Medicare Other | Admitting: Neurology

## 2021-03-14 ENCOUNTER — Other Ambulatory Visit: Payer: Self-pay

## 2021-03-14 DIAGNOSIS — R413 Other amnesia: Secondary | ICD-10-CM

## 2021-03-14 DIAGNOSIS — R29818 Other symptoms and signs involving the nervous system: Secondary | ICD-10-CM

## 2021-03-15 DIAGNOSIS — Z23 Encounter for immunization: Secondary | ICD-10-CM | POA: Diagnosis not present

## 2021-03-16 ENCOUNTER — Telehealth: Payer: Self-pay

## 2021-03-16 ENCOUNTER — Encounter: Payer: Self-pay | Admitting: Cardiology

## 2021-03-16 ENCOUNTER — Ambulatory Visit: Payer: Medicare Other | Admitting: Cardiology

## 2021-03-16 ENCOUNTER — Other Ambulatory Visit: Payer: Self-pay

## 2021-03-16 VITALS — BP 126/70 | HR 66 | Temp 97.1°F | Resp 16 | Ht 66.0 in | Wt 125.6 lb

## 2021-03-16 DIAGNOSIS — F411 Generalized anxiety disorder: Secondary | ICD-10-CM | POA: Diagnosis not present

## 2021-03-16 DIAGNOSIS — I2510 Atherosclerotic heart disease of native coronary artery without angina pectoris: Secondary | ICD-10-CM | POA: Diagnosis not present

## 2021-03-16 DIAGNOSIS — K5900 Constipation, unspecified: Secondary | ICD-10-CM | POA: Diagnosis not present

## 2021-03-16 DIAGNOSIS — R002 Palpitations: Secondary | ICD-10-CM | POA: Diagnosis not present

## 2021-03-16 DIAGNOSIS — I10 Essential (primary) hypertension: Secondary | ICD-10-CM | POA: Diagnosis not present

## 2021-03-16 DIAGNOSIS — R634 Abnormal weight loss: Secondary | ICD-10-CM | POA: Diagnosis not present

## 2021-03-16 HISTORY — DX: Palpitations: R00.2

## 2021-03-16 NOTE — Progress Notes (Signed)
Follow up visit  Subjective:   Sara Brown, female    DOB: 22-Sep-1940, 80 y.o.   MRN: [id number]   Chief Complaint  Patient presents with   Hypertension   Palpitations    80 y/o African-American female with hypertension, diet controlled diabetes, hyperlipidemia,  GERD and remote history of TIA, mild nonobstructive coronary artery disease (Cath 04/01/2018).  Patient had an episode of tachycardia up to 140, lasted for a brief period. She blames it on isosorbide dinitrate that seems to lower her blood pressure. Denies any other symptoms at this time.   Current Outpatient Medications on File Prior to Visit  Medication Sig Dispense Refill   cloNIDine (CATAPRES) 0.1 MG tablet TAKE 1 TABLET BY MOUTH AT BEDTIME , MAY TAKE ADDITIONAL 1 TAB IF BLOOD PRESSURE IS HIGH 180 tablet 1   clopidogrel (PLAVIX) 75 MG tablet Take 75 mg by mouth daily.     Coenzyme Q10 (COQ10) 200 MG CAPS Take 200 mg by mouth daily.     cycloSPORINE (RESTASIS) 0.05 % ophthalmic emulsion Place 1 drop into both eyes 2 (two) times daily.      fluticasone (FLONASE) 50 MCG/ACT nasal spray Place 1 spray into both nostrils in the morning and at bedtime.     Garlic 6767 MG CAPS Take 1,000 mg by mouth daily as needed (high blood pressure).     hydrALAZINE (APRESOLINE) 100 MG tablet Take 100 mg by mouth 3 (three) times daily.     isosorbide dinitrate (ISORDIL) 20 MG tablet TAKE 2 TABLETS BY MOUTH 3 TIMES DAILY. 180 tablet 2   levETIRAcetam (KEPPRA) 500 MG tablet Take 1 tablet (500 mg total) by mouth every 12 (twelve) hours. 60 tablet 5   Magnesium 250 MG TABS Take 250 mg by mouth daily.     Multiple Vitamins-Minerals (MULTIVITAMIN WITH MINERALS) tablet Take 1 tablet by mouth daily. Centrum for Women     ofloxacin (OCUFLOX) 0.3 % ophthalmic solution SMARTSIG:Left Ear     Pitavastatin Calcium (LIVALO) 2 MG TABS Take 1 tablet (2 mg total) by mouth daily. 30 tablet 3   polyethylene glycol (MIRALAX / GLYCOLAX) packet Take 17  g by mouth daily as needed for moderate constipation.     pyridOXINE (VITAMIN B-6) 100 MG tablet Take 100 mg by mouth daily.      spironolactone (ALDACTONE) 50 MG tablet Take 50 mg by mouth daily.      thiamine 100 MG tablet Take 100 mg by mouth daily.     triamcinolone cream (KENALOG) 0.1 % Apply 1 application topically 2 (two) times daily as needed (rash/ eczema).   2   ADVAIR DISKUS 100-50 MCG/DOSE AEPB Inhale 1 puff into the lungs 2 (two) times daily.     albuterol (PROVENTIL HFA;VENTOLIN HFA) 108 (90 Base) MCG/ACT inhaler Inhale 1 puff into the lungs every 4 (four) hours as needed for wheezing.     alendronate (FOSAMAX) 70 MG tablet Take 70 mg by mouth every Saturday. (Patient not taking: Reported on 03/01/2021)     Calcium Citrate-Vitamin D 315-250 MG-UNIT TABS Take 2 tablets by mouth daily.     carboxymethylcellulose (REFRESH PLUS) 0.5 % SOLN Place 1 drop into both eyes in the morning and at bedtime.     Cholecalciferol (VITAMIN D3) 10 MCG (400 UNIT) CAPS Take 800 Units by mouth daily.     Clobetasol Prop Emollient Base (CLOBETASOL PROPIONATE E) 0.05 % emollient cream Apply 1 application topically 2 (two) times daily as needed. (Patient taking differently:  Apply 1 application topically 2 (two) times daily as needed (rash under breast).) 60 g 6   omeprazole (PRILOSEC OTC) 20 MG tablet Take 20 mg by mouth daily as needed (for reflux).      No current facility-administered medications on file prior to visit.    Cardiovascular & other pertient studies:  EKG 03/16/2021: Sinus rhythm 57 bpm Left axis deviation Left atrial enlargement  Poor R-wave progression Low voltage  Renal artery duplex  12/14/2020:  No evidence of renal artery occlusive disease in either renal artery.  Normal intrarenal vascular perfusion is noted in both kidneys.  Renal length is within normal limits for both kidneys.  Diffuse plaque observed in the abdominal aorta. Normal abdominal aorta  flow velocities  noted.  Coronary angiogram 04/01/2018: LM: Normal LAD: Prox-mid 40% stenosis. iFR 0.99 (Physiologically nonsignificant) LCx: Dominant. Small branches with mild disease RCA: Nondominant. Normal   LVEDP mildly elevated.   Carotid artery duplex 02/23/2018: No hemodynamically significant arterial disease in the internal carotid artery bilaterally. No significant plaque burden. Antegrade right vertebral artery flow. Antegrade left vertebral artery flow.  Echocardiogram 10/29/2017: Left ventricle cavity is normal in size. Mild concentric hypertrophy of the left ventricle. Normal global wall motion. Normal diastolic filling pattern. Calculated EF 69%. No significant valvular abnormality.  Insignificant pericardial effusion.  Exercise myoview stress 10/20/2017: 1. The resting electrocardiogram demonstrated normal sinus rhythm, normal resting conduction and no resting arrhythmias.  The stress electrocardiogram was normal.  Patient exercised on Bruce protocol for 7:43 minutes and achieved 8.54 METS. Stress test terminated due to mid sternal chest pain and 90% % MPHR achieved (Target HR >85%). Normal BP. 2.  Myocardial perfusion imaging is normal. Overall left ventricular systolic function was normal without regional wall motion abnormalities. The left ventricular ejection fraction was 76%.  This is a low risk study.  Recent labs: 11/13/2020: Glucose 144, BUN/Cr 16/0.8. EGFR >60. Na/K 139/4.0. Rest of the CMP normal Chol 180, TG 85, HDL 62, LDL 101  05/26/2020: Glucose 115, BUN/Cr 16/1.19. EGFR 46. Na/K 139/4.0 . Rest of the CMP normal H/H 12/36. MCV 88. Platelets 237  10/11/2019: Glucose 123, BUN/Cr 15/0.86. EGFR 60. Na/K 136/4.8. Rest of the CMP normal H/H 12.8/40. MCV 91.5. Platelets 252.  09/20/2019: A1C 6.3% Chol 185, TG 168, HDL 60, LDL 96.  12/31/2018: TSH 1.710   Review of Systems  Cardiovascular:  Positive for palpitations. Negative for chest pain, dyspnea on exertion, leg  swelling and syncope.  Neurological:  Positive for headaches and paresthesias.  Psychiatric/Behavioral:  The patient is nervous/anxious.    Unchanged compared to previous visit     Vitals:   03/16/21 1424  BP: 126/70  Pulse: 66  Resp: 16  Temp: (!) 97.1 F (36.2 C)  SpO2: 98%      Body mass index is 20.27 kg/m. Filed Weights   03/16/21 1424  Weight: 125 lb 9.6 oz (57 kg)     Objective:   Physical Exam Vitals and nursing note reviewed.  Constitutional:      Appearance: She is well-developed.  Neck:     Vascular: No JVD.  Cardiovascular:     Rate and Rhythm: Normal rate and regular rhythm.     Pulses: Intact distal pulses.     Heart sounds: Normal heart sounds. No murmur heard. Pulmonary:     Effort: Pulmonary effort is normal.     Breath sounds: Normal breath sounds. No wheezing or rales.  Musculoskeletal:     Right lower leg: No  edema.     Left lower leg: No edema.   Unchanged compared to previous visit     Assessment & Recommendations:   80 y/o African-American female with hypertension, diet controlled diabetes, hyperlipidemia,  GERD and remote history of TIA, mild nonobstructive coronary artery disease (Cath 04/01/2018).   Hypertension: Overall, very well controlled. Occasional low blood pressure and tachycardia symptoms. Okay to stop Isordil. If tachycardia episodes recur, will give two week cardiac telemetry.  Continue hydralazine 100 mg tid, spironolactone 50 mg daily, clonidine 0.1 mg bid  Coronary artery disease involving native coronary artery of native heart without angina pectoris Continue risk factor modification with BP and lipid lowering therapy. EKG shows no ischemia.  Continue aspirin 81 mg daily, pitvastatin. She has not tolerated other statins in the past.  LDL remains greater than 100.  I have suggested her to consider Inclisiran, once every 52-monthinjections.  F/u in 3 weeks  Calianna Kim JEsther Hardy MD PCommunity [facility] Hospital[facility]iovascular.  [place] Pager: [phone]: [phone]

## 2021-03-16 NOTE — Telephone Encounter (Signed)
Sooner the better.  Thanks MJP

## 2021-03-18 ENCOUNTER — Emergency Department (HOSPITAL_COMMUNITY): Payer: Medicare Other

## 2021-03-18 ENCOUNTER — Other Ambulatory Visit: Payer: Self-pay

## 2021-03-18 ENCOUNTER — Emergency Department (HOSPITAL_COMMUNITY)
Admission: EM | Admit: 2021-03-18 | Discharge: 2021-03-18 | Disposition: A | Discharge status: Home / Self Care | Payer: Medicare Other | Attending: Emergency Medicine | Admitting: Emergency Medicine

## 2021-03-18 DIAGNOSIS — Z955 Presence of coronary angioplasty implant and graft: Secondary | ICD-10-CM | POA: Diagnosis not present

## 2021-03-18 DIAGNOSIS — I2510 Atherosclerotic heart disease of native coronary artery without angina pectoris: Secondary | ICD-10-CM | POA: Diagnosis not present

## 2021-03-18 DIAGNOSIS — R079 Chest pain, unspecified: Secondary | ICD-10-CM | POA: Diagnosis not present

## 2021-03-18 DIAGNOSIS — Z7951 Long term (current) use of inhaled steroids: Secondary | ICD-10-CM | POA: Insufficient documentation

## 2021-03-18 DIAGNOSIS — Z7902 Long term (current) use of antithrombotics/antiplatelets: Secondary | ICD-10-CM | POA: Diagnosis not present

## 2021-03-18 DIAGNOSIS — E1140 Type 2 diabetes mellitus with diabetic neuropathy, unspecified: Secondary | ICD-10-CM | POA: Diagnosis not present

## 2021-03-18 DIAGNOSIS — I10 Essential (primary) hypertension: Secondary | ICD-10-CM | POA: Diagnosis not present

## 2021-03-18 DIAGNOSIS — R0602 Shortness of breath: Secondary | ICD-10-CM | POA: Insufficient documentation

## 2021-03-18 DIAGNOSIS — Z79899 Other long term (current) drug therapy: Secondary | ICD-10-CM | POA: Diagnosis not present

## 2021-03-18 DIAGNOSIS — J45909 Unspecified asthma, uncomplicated: Secondary | ICD-10-CM | POA: Insufficient documentation

## 2021-03-18 DIAGNOSIS — Z20822 Contact with and (suspected) exposure to covid-19: Secondary | ICD-10-CM | POA: Diagnosis not present

## 2021-03-18 DIAGNOSIS — R030 Elevated blood-pressure reading, without diagnosis of hypertension: Secondary | ICD-10-CM | POA: Diagnosis present

## 2021-03-18 LAB — COMPREHENSIVE METABOLIC PANEL
ALT: 34 U/L (ref 0–44)
AST: 32 U/L (ref 15–41)
Albumin: 3.9 g/dL (ref 3.5–5.0)
Alkaline Phosphatase: 45 U/L (ref 38–126)
Anion gap: 10 (ref 5–15)
BUN: 12 mg/dL (ref 8–23)
CO2: 27 mmol/L (ref 22–32)
Calcium: 10.1 mg/dL (ref 8.9–10.3)
Chloride: 96 mmol/L — ABNORMAL LOW (ref 98–111)
Creatinine, Ser: 0.86 mg/dL (ref 0.44–1.00)
GFR, Estimated: >60 mL/min (ref >60)
Glucose, Bld: 106 mg/dL — ABNORMAL HIGH (ref 70–99)
Potassium: 3.9 mmol/L (ref 3.5–5.1)
Sodium: 133 mmol/L — ABNORMAL LOW (ref 135–145)
Total Bilirubin: 1 mg/dL (ref 0.3–1.2)
Total Protein: 7.5 g/dL (ref 6.5–8.1)

## 2021-03-18 LAB — CBC WITH DIFFERENTIAL/PLATELET
Abs Immature Granulocytes: 0.01 10*3/uL (ref 0.00–0.07)
Basophils Absolute: 0 10*3/uL (ref 0.0–0.1)
Basophils Relative: 1 %
Eosinophils Absolute: 0.1 10*3/uL (ref 0.0–0.5)
Eosinophils Relative: 2 %
HCT: 36.7 % (ref 36.0–46.0)
Hemoglobin: 12.4 g/dL (ref 12.0–15.0)
Immature Granulocytes: 0 %
Lymphocytes Relative: 32 %
Lymphs Abs: 1.4 10*3/uL (ref 0.7–4.0)
MCH: 29.5 pg (ref 26.0–34.0)
MCHC: 33.8 g/dL (ref 30.0–36.0)
MCV: 87.2 fL (ref 80.0–100.0)
Monocytes Absolute: 0.5 10*3/uL (ref 0.1–1.0)
Monocytes Relative: 12 %
Neutro Abs: 2.3 10*3/uL (ref 1.7–7.7)
Neutrophils Relative %: 53 %
Platelets: 243 10*3/uL (ref 150–400)
RBC: 4.21 MIL/uL (ref 3.87–5.11)
RDW: 13 % (ref 11.5–15.5)
WBC: 4.3 10*3/uL (ref 4.0–10.5)
nRBC: 0 % (ref 0.0–0.2)

## 2021-03-18 LAB — RESP PANEL BY RT-PCR (FLU A&B, COVID) ARPGX2
Influenza A by PCR: NEGATIVE
Influenza B by PCR: NEGATIVE
SARS Coronavirus 2 by RT PCR: NEGATIVE

## 2021-03-18 LAB — TROPONIN I (HIGH SENSITIVITY): Troponin I (High Sensitivity): 4 ng/L (ref <18)

## 2021-03-18 LAB — BRAIN NATRIURETIC PEPTIDE: B Natriuretic Peptide: 42.6 pg/mL (ref 0.0–100.0)

## 2021-03-18 NOTE — ED Provider Notes (Signed)
Emergency Medicine Provider Triage Evaluation Note  Sara Brown , a 80 y.o. female  was evaluated in triage.  Pt complains of hypertension and chest pain. She saw her doctor on Friday and they made changes to her medications, they took her off of her Isordil.  She states that she has had elevated blood pressures in the 119J to 478G systolic.  She feels like she normally does when she has high blood pressure including tongue pain, headache, however she is having chest pain which is different for her than when she normally has high blood pressure.  She denies any fevers.  She has been coughing but only per her report since she got here.  She currently denies any shortness of breath.  Review of Systems  Positive: HTN, HA, CP Negative: Weakness, back pain, numbness  Physical Exam  BP (!) 170/91   Pulse 69   Temp 98.2 F (36.8 C) (Oral)   Resp 18   Ht 5\' 6"  (1.676 m)   Wt 56.7 kg   SpO2 100%   BMI 20.18 kg/m  Gen:   Awake, no distress   Resp:  Normal effort  MSK:   Moves extremities without difficulty  Other:  Patient is awake and alert, answers questions appropriately without difficulty.  Moves all extremities, speech is not slurred.  No significant dyspnea.  Medical Decision Making  Medically screening exam initiated at 7:20 PM.  Appropriate orders placed.  Sara Brown was informed that the remainder of the evaluation will be completed by another provider, this initial triage assessment does not replace that evaluation, and the importance of remaining in the ED until their evaluation is complete.  Note: Portions of this report may have been transcribed using voice recognition software. Every effort was made to ensure accuracy; however, inadvertent computerized transcription errors may be present    Sara Brown 03/18/21 Esaw Dace, MD 03/18/21 1958

## 2021-03-18 NOTE — ED Provider Notes (Signed)
[facility] EMERGENCY DEPARTMENT Provider Note   CSN: [id number] Arrival date & time: 03/18/21  1856     History Chief Complaint  Patient presents with   Hypertension   Chest Pain    Sara Brown is a 80 y.o. female.  HPI This is an 80 year old female with history of hypertension, hyperlipidemia, diabetes, asthma, TIA who presents with chest pain and hypertension.  Patient reports she measured her blood pressure at home as high as 413 systolic and at this time also having some central chest pain.  Chest pain described as central, nonradiating, 5 out of 10 in severity, intermittent, sharp.  Not associated with shortness of breath, nausea, vomiting, diaphoresis.  No longer having any chest pain.  Denies headache dizziness.    Past Medical History:  Diagnosis Date   Allergic rhinitis    Arthritis    "fingers, knees" (02/09/2018)   Asthma    "stopped taking RX after dr said I don't have this" (02/09/2018)   Carotid stenosis    ICA(L)   Complication of anesthesia    "felt the cut w/one of my c-sections" (02/09/2018)   Constipation    GERD (gastroesophageal reflux disease)    Headache    "years ago; before menopause" (02/09/2018)   Hyperlipidemia    Hypertension    Neuropathy    face (R)   OA (osteoarthritis)    Osteoporosis    TIA (transient ischemic attack) ?2005   Type II diabetes mellitus ([place])    Vitamin D deficiency     Patient Active Problem List   Diagnosis Date Noted   Palpitations 03/16/2021   Hyperglycemia due to type 2 diabetes mellitus ([place]) 03/09/2021   Neuropathic pain 03/09/2021   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   Mixed hyperlipidemia 12/29/2020   TIA (transient ischemic attack) 11/13/2020   Paresthesias 09/05/2020   Age-related osteoporosis without current pathological fracture 08/18/2020   Pure  hypercholesterolemia 08/18/2020   Chronic constipation 08/18/2020   Right ear impacted cerumen 10/26/2019   Coronary artery disease involving native coronary artery of native heart without angina pectoris 09/17/2018   Diabetes mellitus type 2, controlled ([place]) 03/31/2018   Essential hypertension 03/31/2018   Dyslipidemia 03/31/2018   Chest pain 02/10/2018   Chest pain due to coronary artery disease ([place]) 02/09/2018   Angina at rest [facility]) 02/09/2018   Bradycardia 02/09/2018   Skin laxity 06/12/2017   Hirsutism 06/12/2017   Hyperpigmentation 02/13/2017   Neck mass 01/17/2017   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   Asymmetrical hearing loss of left ear 02/21/2016   Ear pain, left 02/21/2016   GERD (gastroesophageal reflux disease)    Shortness of breath 07/30/2007   Cough 07/30/2007    Past Surgical History:  Procedure Laterality Date   CATARACT EXTRACTION W/ INTRAOCULAR LENS  IMPLANT, BILATERAL Bilateral    CESAREAN SECTION  1971; 1975   INTRAVASCULAR PRESSURE WIRE/FFR STUDY N/A 04/01/2018   Procedure: INTRAVASCULAR PRESSURE WIRE/FFR STUDY;  Surgeon: Nigel Mormon, MD;  Location: [place] [facility];  Service: Cardiovascular;  Laterality: N/A;   LEFT HEART CATH AND CORONARY ANGIOGRAPHY N/A 04/01/2018   Procedure: LEFT HEART CATH AND CORONARY ANGIOGRAPHY;  Surgeon: Nigel Mormon, MD;  Location: [place] [facility];  Service: Cardiovascular;  Laterality: N/A;   TUBAL LIGATION  1975   TYMPANOSTOMY TUBE PLACEMENT Left 03/01/2021     OB History  No obstetric history on file.     Family History  Problem Relation Age of Onset   Diabetes Mellitus II Mother    Osteoporosis Mother    Hypertension Mother    Diabetes Mellitus II Father    CVA Sister        6   CAD Sister        4   Hypertension Sister        24   Osteoarthritis Sister        4   Alcoholism Brother    Breast cancer Cousin 75    Social History   Tobacco Use   Smoking status: Never   Smokeless  tobacco: Never  Vaping Use   Vaping Use: Never used  Substance Use Topics   Alcohol use: Never   Drug use: Never    Home Medications Prior to Admission medications   Medication Sig Start Date End Date Taking? Authorizing Provider  ADVAIR DISKUS 100-50 MCG/DOSE AEPB Inhale 1 puff into the lungs 2 (two) times daily. 07/26/20   [provider]  albuterol (PROVENTIL HFA;VENTOLIN HFA) 108 (90 Base) MCG/ACT inhaler Inhale 1 puff into the lungs every 4 (four) hours as needed for wheezing. 07/23/18   [provider]  alendronate (FOSAMAX) 70 MG tablet Take 70 mg by mouth every Saturday. Patient not taking: Reported on 03/01/2021 08/09/18   [provider]  Calcium Citrate-Vitamin D 315-250 MG-UNIT TABS Take 2 tablets by mouth daily.    [provider]  carboxymethylcellulose (REFRESH PLUS) 0.5 % SOLN Place 1 drop into both eyes in the morning and at bedtime.    [provider]  Cholecalciferol (VITAMIN D3) 10 MCG (400 UNIT) CAPS Take 800 Units by mouth daily.    [provider]  Clobetasol Prop Emollient Base (CLOBETASOL PROPIONATE E) 0.05 % emollient cream Apply 1 application topically 2 (two) times daily as needed. Patient taking differently: Apply 1 application topically 2 (two) times daily as needed (rash under breast). 12/08/19   Lavonna Monarch, MD  cloNIDine (CATAPRES) 0.1 MG tablet TAKE 1 TABLET BY MOUTH AT BEDTIME , MAY TAKE ADDITIONAL 1 TAB IF BLOOD PRESSURE IS HIGH 03/07/21   Patwardhan, Manish J, MD  clopidogrel (PLAVIX) 75 MG tablet Take 75 mg by mouth daily. 03/07/21   [provider]  Coenzyme Q10 (COQ10) 200 MG CAPS Take 200 mg by mouth daily.    [provider]  cycloSPORINE (RESTASIS) 0.05 % ophthalmic emulsion Place 1 drop into both eyes 2 (two) times daily.     [provider]  fluticasone (FLONASE) 50 MCG/ACT nasal spray Place 1 spray into both nostrils in the morning and at bedtime.    [provider]  Garlic 6213 MG CAPS Take 1,000 mg by mouth daily as needed (high blood pressure).    [provider]  hydrALAZINE (APRESOLINE) 100 MG tablet Take 100 mg by mouth 3 (three) times daily. 10/22/20   [provider]  levETIRAcetam (KEPPRA) 500 MG tablet Take 1 tablet (500 mg total) by mouth every 12 (twelve) hours. 10/12/20   Star Age, MD  Magnesium 250 MG TABS Take 250 mg by mouth daily.    [provider]  Multiple Vitamins-Minerals (MULTIVITAMIN WITH MINERALS) tablet Take 1 tablet by mouth daily. Centrum for Women    [provider]  ofloxacin (OCUFLOX) 0.3 % ophthalmic solution SMARTSIG:Left Ear 03/01/21   [provider]  omeprazole (PRILOSEC OTC) 20 MG tablet Take 20 mg by mouth  daily as needed (for reflux).     [provider]  Pitavastatin Calcium (LIVALO) 2 MG TABS Take 1 tablet (2 mg total) by mouth daily. 01/17/21   Patwardhan, Reynold Bowen, MD  polyethylene glycol (MIRALAX / GLYCOLAX) packet Take 17 g by mouth daily as needed for moderate constipation.    [provider]  pyridOXINE (VITAMIN B-6) 100 MG tablet Take 100 mg by mouth daily.     [provider]  spironolactone (ALDACTONE) 50 MG tablet Take 50 mg by mouth daily.     [provider]  thiamine 100 MG tablet Take 100 mg by mouth daily.    [provider]  triamcinolone cream (KENALOG) 0.1 % Apply 1 application topically 2 (two) times daily as needed (rash/ eczema).  11/01/14   [provider]    Allergies    Naproxen, Prednisone, Amlodipine, and Losartan  Review of Systems   Review of Systems  Constitutional:  Negative for chills and fever.  HENT:  Negative for ear pain and sore throat.   Eyes:  Negative for pain and visual disturbance.  Respiratory:  Negative for cough and shortness of breath.   Cardiovascular:  Positive for chest pain. Negative for palpitations.  Gastrointestinal:  Negative for abdominal pain and vomiting.   Genitourinary:  Negative for dysuria and hematuria.  Musculoskeletal:  Negative for arthralgias and back pain.  Skin:  Negative for color change and rash.  Neurological:  Negative for seizures and syncope.  All other systems reviewed and are negative.  Physical Exam Updated Vital Signs BP (!) 151/84 (BP Location: Right Arm)   Pulse 63   Temp 98.2 F (36.8 C) (Oral)   Resp 20   Ht 5\' 6"  (1.676 m)   Wt 56.7 kg   SpO2 99%   BMI 20.18 kg/m   Physical Exam Vitals and nursing note reviewed.  Constitutional:      General: She is not in acute distress.    Appearance: She is well-developed.  HENT:     Head: Normocephalic and atraumatic.  Eyes:     Conjunctiva/sclera: Conjunctivae normal.  Cardiovascular:     Rate and Rhythm: Normal rate and regular rhythm.     Heart sounds: No murmur heard. Pulmonary:     Effort: Pulmonary effort is normal. No respiratory distress.     Breath sounds: Normal breath sounds.  Abdominal:     Palpations: Abdomen is soft.     Tenderness: There is no abdominal tenderness.  Musculoskeletal:     Cervical back: Neck supple.     Right lower leg: No edema.     Left lower leg: No edema.  Skin:    General: Skin is warm and dry.  Neurological:     General: No focal deficit present.     Mental Status: She is alert.    ED Results / Procedures / Treatments   Labs (all labs ordered are listed, but only abnormal results are displayed) Labs Reviewed  COMPREHENSIVE METABOLIC PANEL - Abnormal; Notable for the following components:      Result Value   Sodium 133 (*)    Chloride 96 (*)    Glucose, Bld 106 (*)    All other components within normal limits  RESP PANEL BY RT-PCR (FLU A&B, COVID) ARPGX2  CBC WITH DIFFERENTIAL/PLATELET  BRAIN NATRIURETIC PEPTIDE  TROPONIN I (HIGH SENSITIVITY)  TROPONIN I (HIGH SENSITIVITY)    EKG EKG Interpretation  Date/Time:  Sunday March 18 2021 19:16:42 EDT Ventricular Rate:  65 PR Interval:  168 QRS  Duration: 86 QT Interval:  402 QTC Calculation: 418 R Axis:   -29 Text Interpretation: Normal sinus rhythm Possible Left atrial enlargement Low voltage QRS Septal infarct , age undetermined Abnormal ECG No significant change since last tracing Confirmed by Wandra Arthurs ([id number]) on 03/18/2021 9:11:30 PM  Radiology DG Chest 2 View  Result Date: 03/18/2021 CLINICAL DATA:  Chest pain and hypertension. EXAM: CHEST - 2 VIEW COMPARISON:  09/04/2020 FINDINGS: Normal heart size and pulmonary vascularity. Slight linear fibrosis in the lung bases is unchanged since prior study. Lungs are otherwise clear and expanded. No airspace disease or consolidation. No pleural effusions. No pneumothorax. Mediastinal contours appear intact. Colonic interposition under the right hemidiaphragm. Degenerative changes in the spine and shoulders. IMPRESSION: No active cardiopulmonary disease. Slight fibrosis in the lung bases. Electronically Signed   By: Lucienne Capers M.D.   On: 03/18/2021 20:22    Procedures Procedures   Medications Ordered in ED Medications - No data to display  ED Course  I have reviewed the triage vital signs and the nursing notes.  Pertinent labs & imaging results that were available during my care of the patient were reviewed by me and considered in my medical decision making (see chart for details).    MDM Rules/Calculators/A&P                          Patient is stable and well-appearing.  Mildly hypertensive with systolic blood pressure of 170/90, on recheck is improved to 150s over 80s.  Exam is unremarkable.  Nonfocal neurologic exam and no headache.  Chest x-ray without cardiopulmonary abnormality.  EKG without evidence of arrhythmia or acute ischemia.  Troponin is negative x1 BNP is normal.  Creatinine is also normal.  No evidence of hemolysis.  No evidence of hypertensive urgency or emergency.  Patient is asymptomatic.  She is stable for discharge home with PCP follow-up.  Given strict  return precautions.  Discharged home in stable condition.  Final Clinical Impression(s) / ED Diagnoses Final diagnoses:  Hypertension, unspecified type    Rx / DC Orders ED Discharge Orders     None        Coralee Pesa, MD 03/20/21 0004    Drenda Freeze, MD 03/22/21 [id number]

## 2021-03-18 NOTE — ED Notes (Signed)
Pt provided discharge instructions and prescription information. Pt was given the opportunity to ask questions and questions were answered. Discharge signature not obtained in the setting of the COVID-19 pandemic in order to reduce high touch surfaces.  ° °

## 2021-03-18 NOTE — ED Triage Notes (Addendum)
Pt c/I high blood pressure and chest pain that started earlier today. Pt also reports a sensation on her tongue.

## 2021-03-18 NOTE — ED Notes (Signed)
Blue top sent down with other labs.

## 2021-03-18 NOTE — Discharge Instructions (Addendum)
Your testing today was very reassuring.  Follow-up with your primary care doctor regarding your high blood pressure and possible need for medication changes.  In the meantime take all your medications as prescribed.  Please return to the emergency department for new or worsening symptoms including chest pain, shortness of breath, and headache.

## 2021-03-20 NOTE — Progress Notes (Signed)
Follow up visit  Subjective:   Sara Brown, female    DOB: 1941/03/06, 80 y.o.   MRN: [id number]   Chief Complaint  Patient presents with   Hypertension   Hospitalization Follow-up   Shortness of Breath    80 y/o African-American female with hypertension, diet controlled diabetes, hyperlipidemia,  GERD and remote history of TIA, mild nonobstructive coronary artery disease (Cath 04/01/2018).  Patient presented to ED for evaluation 03/18/2021 with central chest pressure and home blood pressure readings as high as 071 mmHg systolic.  Evaluation in the ED revealed BNP normal, troponin negative, chest x-ray normal.  Patient's blood pressure improved to 150s/80s mmHg patient was felt stable for discharge home.  She was last seen in our office 03/16/2021 at which time stopped Isordil given occasional low blood pressures and tachycardia symptoms.  Patient now presents for follow-up.  Patient has had improvement of tachycardia symptoms and low blood pressure episodes since discontinuing Isordil.  Her primary concern today is weight loss and a rash on her back and sides which she is concerned may be related to spironolactone or hydralazine.  Denies other symptoms at this time.  Current Outpatient Medications on File Prior to Visit  Medication Sig Dispense Refill   ADVAIR DISKUS 100-50 MCG/DOSE AEPB Inhale 1 puff into the lungs 2 (two) times daily.     albuterol (PROVENTIL HFA;VENTOLIN HFA) 108 (90 Base) MCG/ACT inhaler Inhale 1 puff into the lungs every 4 (four) hours as needed for wheezing.     Calcium Citrate-Vitamin D 315-250 MG-UNIT TABS Take 2 tablets by mouth daily.     Cholecalciferol (VITAMIN D3) 10 MCG (400 UNIT) CAPS Take 800 Units by mouth daily.     cloNIDine (CATAPRES) 0.1 MG tablet TAKE 1 TABLET BY MOUTH AT BEDTIME , MAY TAKE ADDITIONAL 1 TAB IF BLOOD PRESSURE IS HIGH 180 tablet 1   clopidogrel (PLAVIX) 75 MG tablet Take 75 mg by mouth daily.     Coenzyme Q10 (COQ10) 200  MG CAPS Take 200 mg by mouth daily.     cycloSPORINE (RESTASIS) 0.05 % ophthalmic emulsion Place 1 drop into both eyes 2 (two) times daily.      fluticasone (FLONASE) 50 MCG/ACT nasal spray Place 1 spray into both nostrils in the morning and at bedtime.     Garlic 2197 MG CAPS Take 1,000 mg by mouth daily as needed (high blood pressure).     hydrALAZINE (APRESOLINE) 100 MG tablet Take 100 mg by mouth 3 (three) times daily.     levETIRAcetam (KEPPRA) 500 MG tablet Take 1 tablet (500 mg total) by mouth every 12 (twelve) hours. 60 tablet 5   Magnesium 250 MG TABS Take 250 mg by mouth daily.     Multiple Vitamins-Minerals (MULTIVITAMIN WITH MINERALS) tablet Take 1 tablet by mouth daily. Centrum for Women     omeprazole (PRILOSEC OTC) 20 MG tablet Take 20 mg by mouth daily as needed (for reflux).      Pitavastatin Calcium (LIVALO) 2 MG TABS Take 1 tablet (2 mg total) by mouth daily. 30 tablet 3   polyethylene glycol (MIRALAX / GLYCOLAX) packet Take 17 g by mouth daily as needed for moderate constipation.     pyridOXINE (VITAMIN B-6) 100 MG tablet Take 100 mg by mouth daily.      spironolactone (ALDACTONE) 50 MG tablet Take 50 mg by mouth daily.      thiamine 100 MG tablet Take 100 mg by mouth daily.     triamcinolone  cream (KENALOG) 0.1 % Apply 1 application topically 2 (two) times daily as needed (rash/ eczema).   2   alendronate (FOSAMAX) 70 MG tablet Take 70 mg by mouth every Saturday. (Patient not taking: Reported on 03/01/2021)     carboxymethylcellulose (REFRESH PLUS) 0.5 % SOLN Place 1 drop into both eyes in the morning and at bedtime.     Clobetasol Prop Emollient Base (CLOBETASOL PROPIONATE E) 0.05 % emollient cream Apply 1 application topically 2 (two) times daily as needed. (Patient taking differently: Apply 1 application topically 2 (two) times daily as needed (rash under breast).) 60 g 6   ofloxacin (OCUFLOX) 0.3 % ophthalmic solution SMARTSIG:Left Ear (Patient not taking: Reported on  03/21/2021)     No current facility-administered medications on file prior to visit.    Cardiovascular & other pertient studies:  EKG 03/16/2021: Sinus rhythm 57 bpm Left axis deviation Left atrial enlargement  Poor R-wave progression Low voltage  Renal artery duplex  12/14/2020:  No evidence of renal artery occlusive disease in either renal artery.  Normal intrarenal vascular perfusion is noted in both kidneys.  Renal length is within normal limits for both kidneys.  Diffuse plaque observed in the abdominal aorta. Normal abdominal aorta  flow velocities noted.  Coronary angiogram 04/01/2018: LM: Normal LAD: Prox-mid 40% stenosis. iFR 0.99 (Physiologically nonsignificant) LCx: Dominant. Small branches with mild disease RCA: Nondominant. Normal   LVEDP mildly elevated.   Carotid artery duplex 02/23/2018: No hemodynamically significant arterial disease in the internal carotid artery bilaterally. No significant plaque burden. Antegrade right vertebral artery flow. Antegrade left vertebral artery flow.  Echocardiogram 10/29/2017: Left ventricle cavity is normal in size. Mild concentric hypertrophy of the left ventricle. Normal global wall motion. Normal diastolic filling pattern. Calculated EF 69%. No significant valvular abnormality.  Insignificant pericardial effusion.  Exercise myoview stress 10/20/2017: 1. The resting electrocardiogram demonstrated normal sinus rhythm, normal resting conduction and no resting arrhythmias.  The stress electrocardiogram was normal.  Patient exercised on Bruce protocol for 7:43 minutes and achieved 8.54 METS. Stress test terminated due to mid sternal chest pain and 90% % MPHR achieved (Target HR >85%). Normal BP. 2.  Myocardial perfusion imaging is normal. Overall left ventricular systolic function was normal without regional wall motion abnormalities. The left ventricular ejection fraction was 76%.  This is a low risk study.  Recent  labs: 03/18/2021: Sodium 133, potassium 3.9, BUN 12, creatinine 0.86, AST 32, ALT 34, GFR >60 Hgb 12.4, HCT 36.7, platelets 243 High-sensitivity troponin 4 BNP 42.6  11/13/2020: Glucose 144, BUN/Cr 16/0.8. EGFR >60. Na/K 139/4.0. Rest of the CMP normal Chol 180, TG 85, HDL 62, LDL 101  05/26/2020: Glucose 115, BUN/Cr 16/1.19. EGFR 46. Na/K 139/4.0 . Rest of the CMP normal H/H 12/36. MCV 88. Platelets 237  10/11/2019: Glucose 123, BUN/Cr 15/0.86. EGFR 60. Na/K 136/4.8. Rest of the CMP normal H/H 12.8/40. MCV 91.5. Platelets 252.  09/20/2019: A1C 6.3% Chol 185, TG 168, HDL 60, LDL 96.  12/31/2018: TSH 1.710   Review of Systems  Constitutional: Positive for weight loss.  Cardiovascular:  Negative for chest pain, dyspnea on exertion, leg swelling, palpitations and syncope.  Skin:  Positive for rash.  Neurological:  Positive for headaches and paresthesias.  Psychiatric/Behavioral:  The patient is nervous/anxious.         Vitals:   03/21/21 0928  BP: (!) 145/75  Pulse: 73  Temp: 98 F (36.7 C)  SpO2: 99%      Body mass index is 19.53  kg/m. Filed Weights   03/21/21 0928  Weight: 121 lb (54.9 kg)     Objective:   Physical Exam Vitals and nursing note reviewed.  Constitutional:      Appearance: She is well-developed.  Neck:     Vascular: No JVD.  Cardiovascular:     Rate and Rhythm: Normal rate and regular rhythm.     Pulses: Intact distal pulses.     Heart sounds: Normal heart sounds. No murmur heard. Pulmonary:     Effort: Pulmonary effort is normal.     Breath sounds: Normal breath sounds. No wheezing or rales.  Musculoskeletal:     Right lower leg: No edema.     Left lower leg: No edema.  Skin:    Comments: Diffuse hyperpigmented rash over her back        Assessment & Recommendations:   80 y/o African-American female with hypertension, diet controlled diabetes, hyperlipidemia,  GERD and remote history of TIA, mild nonobstructive coronary artery  disease (Cath 04/01/2018).  Hypertension: Overall relatively well controlled Patient has had improvement of tachycardia symptoms since stopping Isordil Discussed at length with patient that as she has been tolerating hydralazine and spironolactone for quite a while without issue and her weight has been stable over the last several months do not feel these medications are contributing to her concerns of rash and weight loss.  Patient verbalized understanding and agreed to continue with present medications. Continue hydralazine, spironolactone, clonidine.  Coronary artery disease involving native coronary artery of native heart without angina pectoris Continue aspirin 81 mg daily, pitvastatin.  Patient has been unable to tolerate other statins in the past.  At last office visit Dr. Virgina Jock recommended consideration of Inclisiran  Will defer further management to Dr. Virgina Jock as he is patient's primary cardiologist.   Follow up in 2 weeks as previously scheduled with Dr. Virgina Jock.   Alethia Berthold, PA-C 03/21/2021, 4:20 PM Office: [phone]

## 2021-03-21 ENCOUNTER — Ambulatory Visit: Payer: Medicare Other | Admitting: Student

## 2021-03-21 ENCOUNTER — Encounter: Payer: Self-pay | Admitting: Student

## 2021-03-21 ENCOUNTER — Other Ambulatory Visit: Payer: Self-pay

## 2021-03-21 VITALS — BP 145/75 | HR 73 | Temp 98.0°F | Ht 66.0 in | Wt 121.0 lb

## 2021-03-21 DIAGNOSIS — I10 Essential (primary) hypertension: Secondary | ICD-10-CM | POA: Diagnosis not present

## 2021-03-23 ENCOUNTER — Telehealth: Payer: Self-pay | Admitting: Neurology

## 2021-03-23 NOTE — Telephone Encounter (Signed)
Pls let her know the prolonged EEG showed similar changes to the EEGs done in the hospital, but there was no seizure activity seen on the EEG. Has she had any more episodes of right-sided symptoms? Thanks

## 2021-03-23 NOTE — Telephone Encounter (Signed)
Patient called for recent EEG results from 03/14/21.

## 2021-03-23 NOTE — Telephone Encounter (Signed)
Patient advised and voiced understanding.  

## 2021-03-30 DIAGNOSIS — R634 Abnormal weight loss: Secondary | ICD-10-CM | POA: Diagnosis not present

## 2021-03-30 DIAGNOSIS — F418 Other specified anxiety disorders: Secondary | ICD-10-CM | POA: Diagnosis not present

## 2021-04-02 ENCOUNTER — Ambulatory Visit: Payer: PRIVATE HEALTH INSURANCE | Admitting: Cardiology

## 2021-04-02 DIAGNOSIS — Z23 Encounter for immunization: Secondary | ICD-10-CM | POA: Diagnosis not present

## 2021-04-03 DIAGNOSIS — H6522 Chronic serous otitis media, left ear: Secondary | ICD-10-CM | POA: Diagnosis not present

## 2021-04-03 DIAGNOSIS — H903 Sensorineural hearing loss, bilateral: Secondary | ICD-10-CM | POA: Diagnosis not present

## 2021-04-03 DIAGNOSIS — Z9622 Myringotomy tube(s) status: Secondary | ICD-10-CM

## 2021-04-03 HISTORY — DX: Myringotomy tube(s) status: Z96.22

## 2021-04-05 NOTE — Progress Notes (Signed)
Follow up visit  Subjective:   Sara Brown, female    DOB: 1940/09/06, 80 y.o.   MRN: [id number]   Chief Complaint  Patient presents with   Shortness of Breath   Palpitations   Hypertension   Follow-up    80 y/o African-American female with hypertension, diet controlled diabetes, hyperlipidemia,  GERD and remote history of TIA, mild nonobstructive coronary artery disease (Cath 04/01/2018).  Patient was seen by Lawerance Cruel, PA 2 weeks ago for hives on her back.  The symptoms improved after stopping her Isordil.  Patient is not concerned about weight loss which she attributes to hydralazine.  Reportedly, she has lost 20 pounds.  I reviewed prior records, she has lost weight but no more than 15 pounds in last 3 years, according to our chart.  Current Outpatient Medications on File Prior to Visit  Medication Sig Dispense Refill   ADVAIR DISKUS 100-50 MCG/DOSE AEPB Inhale 1 puff into the lungs 2 (two) times daily.     albuterol (PROVENTIL HFA;VENTOLIN HFA) 108 (90 Base) MCG/ACT inhaler Inhale 1 puff into the lungs every 4 (four) hours as needed for wheezing.     alendronate (FOSAMAX) 70 MG tablet Take 70 mg by mouth every Saturday. (Patient not taking: Reported on 03/01/2021)     Calcium Citrate-Vitamin D 315-250 MG-UNIT TABS Take 2 tablets by mouth daily.     carboxymethylcellulose (REFRESH PLUS) 0.5 % SOLN Place 1 drop into both eyes in the morning and at bedtime.     Cholecalciferol (VITAMIN D3) 10 MCG (400 UNIT) CAPS Take 800 Units by mouth daily.     Clobetasol Prop Emollient Base (CLOBETASOL PROPIONATE E) 0.05 % emollient cream Apply 1 application topically 2 (two) times daily as needed. (Patient taking differently: Apply 1 application topically 2 (two) times daily as needed (rash under breast).) 60 g 6   cloNIDine (CATAPRES) 0.1 MG tablet TAKE 1 TABLET BY MOUTH AT BEDTIME , MAY TAKE ADDITIONAL 1 TAB IF BLOOD PRESSURE IS HIGH 180 tablet 1   clopidogrel (PLAVIX) 75 MG  tablet Take 75 mg by mouth daily.     Coenzyme Q10 (COQ10) 200 MG CAPS Take 200 mg by mouth daily.     cycloSPORINE (RESTASIS) 0.05 % ophthalmic emulsion Place 1 drop into both eyes 2 (two) times daily.      fluticasone (FLONASE) 50 MCG/ACT nasal spray Place 1 spray into both nostrils in the morning and at bedtime.     Garlic 7829 MG CAPS Take 1,000 mg by mouth daily as needed (high blood pressure).     hydrALAZINE (APRESOLINE) 100 MG tablet Take 100 mg by mouth 3 (three) times daily.     levETIRAcetam (KEPPRA) 500 MG tablet Take 1 tablet (500 mg total) by mouth every 12 (twelve) hours. 60 tablet 5   Magnesium 250 MG TABS Take 250 mg by mouth daily.     Multiple Vitamins-Minerals (MULTIVITAMIN WITH MINERALS) tablet Take 1 tablet by mouth daily. Centrum for Women     ofloxacin (OCUFLOX) 0.3 % ophthalmic solution SMARTSIG:Left Ear (Patient not taking: Reported on 03/21/2021)     omeprazole (PRILOSEC OTC) 20 MG tablet Take 20 mg by mouth daily as needed (for reflux).      Pitavastatin Calcium (LIVALO) 2 MG TABS Take 1 tablet (2 mg total) by mouth daily. 30 tablet 3   polyethylene glycol (MIRALAX / GLYCOLAX) packet Take 17 g by mouth daily as needed for moderate constipation.     pyridOXINE (VITAMIN B-6) 100  MG tablet Take 100 mg by mouth daily.      spironolactone (ALDACTONE) 50 MG tablet Take 50 mg by mouth daily.      thiamine 100 MG tablet Take 100 mg by mouth daily.     triamcinolone cream (KENALOG) 0.1 % Apply 1 application topically 2 (two) times daily as needed (rash/ eczema).   2   No current facility-administered medications on file prior to visit.    Cardiovascular & other pertient studies:  EKG 03/16/2021: Sinus rhythm 57 bpm Left axis deviation Left atrial enlargement  Poor R-wave progression Low voltage  Renal artery duplex  12/14/2020:  No evidence of renal artery occlusive disease in either renal artery.  Normal intrarenal vascular perfusion is noted in both kidneys.  Renal  length is within normal limits for both kidneys.  Diffuse plaque observed in the abdominal aorta. Normal abdominal aorta  flow velocities noted.  Coronary angiogram 04/01/2018: LM: Normal LAD: Prox-mid 40% stenosis. iFR 0.99 (Physiologically nonsignificant) LCx: Dominant. Small branches with mild disease RCA: Nondominant. Normal   LVEDP mildly elevated.   Carotid artery duplex 02/23/2018: No hemodynamically significant arterial disease in the internal carotid artery bilaterally. No significant plaque burden. Antegrade right vertebral artery flow. Antegrade left vertebral artery flow.  Echocardiogram 10/29/2017: Left ventricle cavity is normal in size. Mild concentric hypertrophy of the left ventricle. Normal global wall motion. Normal diastolic filling pattern. Calculated EF 69%. No significant valvular abnormality.  Insignificant pericardial effusion.  Exercise myoview stress 10/20/2017: 1. The resting electrocardiogram demonstrated normal sinus rhythm, normal resting conduction and no resting arrhythmias.  The stress electrocardiogram was normal.  Patient exercised on Bruce protocol for 7:43 minutes and achieved 8.54 METS. Stress test terminated due to mid sternal chest pain and 90% % MPHR achieved (Target HR >85%). Normal BP. 2.  Myocardial perfusion imaging is normal. Overall left ventricular systolic function was normal without regional wall motion abnormalities. The left ventricular ejection fraction was 76%.  This is a low risk study.  Recent labs: 11/13/2020: Glucose 144, BUN/Cr 16/0.8. EGFR >60. Na/K 139/4.0. Rest of the CMP normal Chol 180, TG 85, HDL 62, LDL 101  05/26/2020: Glucose 115, BUN/Cr 16/1.19. EGFR 46. Na/K 139/4.0 . Rest of the CMP normal H/H 12/36. MCV 88. Platelets 237  10/11/2019: Glucose 123, BUN/Cr 15/0.86. EGFR 60. Na/K 136/4.8. Rest of the CMP normal H/H 12.8/40. MCV 91.5. Platelets 252.  09/20/2019: A1C 6.3% Chol 185, TG 168, HDL 60, LDL  96.  12/31/2018: TSH 1.710   Review of Systems  Cardiovascular:  Negative for chest pain, dyspnea on exertion, leg swelling, palpitations and syncope.  Skin:  Positive for rash (Hives on back, now resolving).  Neurological:  Negative for headaches and paresthesias.  Psychiatric/Behavioral:  The patient is nervous/anxious.    Unchanged compared to previous visit     Vitals:   04/06/21 0905  BP: 126/67  Pulse: 65  Resp: 16  Temp: 98 F (36.7 C)  SpO2: 97%      Body mass index is 20.82 kg/m. Filed Weights   04/06/21 0905  Weight: 129 lb (58.5 kg)     Objective:   Physical Exam Vitals and nursing note reviewed.  Constitutional:      Appearance: She is well-developed.  Neck:     Vascular: No JVD.  Cardiovascular:     Rate and Rhythm: Normal rate and regular rhythm.     Pulses: Intact distal pulses.     Heart sounds: Normal heart sounds. No murmur heard. Pulmonary:  Effort: Pulmonary effort is normal.     Breath sounds: Normal breath sounds. No wheezing or rales.  Musculoskeletal:     Right lower leg: No edema.     Left lower leg: No edema.       Assessment & Recommendations:   80 y/o African-American female with hypertension, diet controlled diabetes, hyperlipidemia,  GERD and remote history of TIA, mild nonobstructive coronary artery disease (Cath 04/01/2018).   Hypertension: Overall, very well controlled. Continue hydralazine 100 mg tid, spironolactone 50 mg daily, clonidine 0.1 mg bid Encouraged her to talk to her PCP regarding age-appropriate cancer screening, given her weight loss.  If no other alternate cause found, I am okay to try taking her off hydralazine.  I do anticipate her blood pressure will increase with that change.  Patient has been intolerant to several other medications.  We may need to alternate therapy.  Of note, beta-blockers, along with concurrent use of clonidine, caused her significant bradycardia.  We may need to consider taking off  clonidine and adding beta-blockers, if necessary.  Coronary artery disease involving native coronary artery of native heart without angina pectoris Continue risk factor modification with BP and lipid lowering therapy. EKG shows no ischemia.  Continue aspirin 81 mg daily, pitvastatin. She has not tolerated other statins in the past. She does not want to try injectables.  F/u in 3 months   [place], MD [facility] Pager: [phone] Office: [phone]

## 2021-04-06 ENCOUNTER — Ambulatory Visit: Payer: Medicare Other | Admitting: Cardiology

## 2021-04-06 ENCOUNTER — Other Ambulatory Visit: Payer: Self-pay

## 2021-04-06 ENCOUNTER — Encounter: Payer: Self-pay | Admitting: Cardiology

## 2021-04-06 ENCOUNTER — Telehealth: Payer: Self-pay | Admitting: Pharmacist

## 2021-04-06 VITALS — BP 126/67 | HR 65 | Temp 98.0°F | Resp 16 | Ht 66.0 in | Wt 129.0 lb

## 2021-04-06 DIAGNOSIS — R634 Abnormal weight loss: Secondary | ICD-10-CM | POA: Diagnosis not present

## 2021-04-06 DIAGNOSIS — I2510 Atherosclerotic heart disease of native coronary artery without angina pectoris: Secondary | ICD-10-CM | POA: Diagnosis not present

## 2021-04-06 DIAGNOSIS — N9089 Other specified noninflammatory disorders of vulva and perineum: Secondary | ICD-10-CM | POA: Diagnosis not present

## 2021-04-06 DIAGNOSIS — I10 Essential (primary) hypertension: Secondary | ICD-10-CM | POA: Diagnosis not present

## 2021-04-06 MED ORDER — NIFEDIPINE ER OSMOTIC RELEASE 30 MG PO TB24: 30.0000 mg | ORAL_TABLET | Freq: Every day | ORAL | 1 refills | Status: DC

## 2021-04-06 NOTE — Telephone Encounter (Signed)
Pt called concerned about her weight loss over the past year or so. Pt reports that she lost ~15-20 lbs. Pt states that she hasnt notice any changes in her appetite or activity level that she believes is contributing to the weight loss. Pt saw her OBGYN to review gynecological/oncological causes of sudden weight loss. Pt read online that hydralazine has been noted to have anorexia ADRs. Per [employer], loss of appetite listed as possible side effect, however the frequency and severity of the symptoms unknown. Pt has been on hydralazine since 2018. Per chart review, pt has tried and hasnt been able to tolerate several antihypertensives in the past; including: amlodipine (hives), losartan (hives), atenolol, BiDil (hypotension), isosorbide (heavy breathing, palpitation, skin irritation), hydralazine (weight loss). Pt noted to have dermatological concerns including eczema and dry skin and unsure if previous concerns of hives were medication related or not. Pt states that her sister uses nifedipine and would like to switch the hydralazine to nifedipine. Reviewed with Dr. Virgina Jock and agreeable with transition to nifedipine Reviewed with pt to monitor for s/sx of edema, hypotension, flushing symptoms.   Pt denies any anaphylactic reaction to amlodipine in the past. Denies any signs or symptoms of trouble breathing, severe itching or hives, lips/tongue.face swelling. Reviewed with pt about possible cross reactivity between amlodipine and nifedipine. Pt insisting to proceed with nifedipine and closely monitoring for s/sx of adverse reactions.

## 2021-04-10 NOTE — Procedures (Signed)
ELECTROENCEPHALOGRAM REPORT  Dates of Recording: 03/14/2021 11:25AM to 03/15/2021 11:28AM  Patient's Name: Sara Brown MRN: [id number] Date of Birth: 1940/08/13  Referring Provider: Dr. Ellouise Newer  Procedure: 24-hour ambulatory video EEG  History: This is an 80 year old woman with recurrent intermittent episodes of paresthesias on one side of her body, initially reporting symptoms on the right side, but other times on the left  Seizure Medications:  Keppra  Technical Summary: This is a 24-hour multichannel digital video EEG recording measured by the international 10-20 system with electrodes applied with paste and impedances below 5000 ohms performed as portable with EKG monitoring.  The digital EEG was referentially recorded, reformatted, and digitally filtered in a variety of bipolar and referential montages for optimal display.    DESCRIPTION OF RECORDING: During maximal wakefulness, the background activity consisted of a symmetric 11 Hz posterior dominant rhythm which was reactive to eye opening.  There is occasional focal 2 Hz delta slowing over the left temporal region. There were no epileptiform discharges seen in wakefulness.  During the recording, the patient progresses through wakefulness, drowsiness, and Stage 2 sleep. Similar occasional focal delta slowing was seen over the left temporal region. Again, there were no epileptiform discharges seen.  Events: On 10/5 at 1149 hours, she had a feeling in her chest, breathing heavy. Patient finishing EEG application, no clinical changes seen. Electrographically, there were no EEG or EKG changes seen.  On 10/5 at 1358 hours, she notes labored breathing. Patient not on video. Electrographically, there were no EEG or EKG changes seen.  On 10/5 at 1414 hours, she has tight feeling in tongue and lip. Patient not on video. Electrographically, there were no EEG or EKG changes seen.  On 10/5 at 1446 hours, she has uncomfortable  feeling in chest. Patient not on video. Electrographically, there were no EEG or EKG changes seen.  On 10/5 at 1547 hours, she notes heavy breathing. Patient not on video. Electrographically, there were no EEG or EKG changes seen.  On 10/5 at 2108 hours, she notes a little discomfort on left side of chest. Patient sitting on recliner, no clinical changes seen. Electrographically, there were no EEG or EKG changes seen.   There were no electrographic seizures seen.  EKG lead was unremarkable.  IMPRESSION: This 24-hour ambulatory video EEG study is abnormal due to occasional focal slowing over the left temporal region.  Clinical Correlation of the above findings indicates focal cerebral dysfunction over the left temporal region suggestive of underlying structural or physiologic abnormality. The absence of epileptiform discharges does not exclude a clinical diagnosis of epilepsy. Typical events were not captured. Episodes of chest discomfort and heavy breathing did not show any EEG correlate. Clinical correlation is advised.    Ellouise Newer, M.D.

## 2021-04-13 NOTE — Telephone Encounter (Signed)
Pt called stating that she stopped her hydralazine and started nifedipine tablets yesterday. Started with 1/2 tab of nifedipine 30 mg to ensure pt is able to tolerate it without any ADRs or allergic reactions. Pt stated that her SBP was elevated slightly in 140s yesterday. Initially discussed with pt to take Nifedipine 15 mg for 2-3 days to ensure pt was able to tolerate the new medication before increasing the dose to achieve enough antihypertensive benefits. Pt was previously on hydralazine 100 mg TID but stopped due to pt reported concerns of possible weight loss associated with hydralazine. Pt to continue monitoring her home BP right now and monitoring for any signs or symptoms of allergic or ADRs reactions.

## 2021-04-16 ENCOUNTER — Encounter: Payer: Self-pay | Admitting: Podiatrist

## 2021-04-16 ENCOUNTER — Ambulatory Visit (INDEPENDENT_AMBULATORY_CARE_PROVIDER_SITE_OTHER): Payer: Medicare Other | Admitting: Podiatrist

## 2021-04-16 ENCOUNTER — Other Ambulatory Visit: Payer: Self-pay

## 2021-04-16 DIAGNOSIS — M2041 Other hammer toe(s) (acquired), right foot: Secondary | ICD-10-CM | POA: Diagnosis not present

## 2021-04-16 DIAGNOSIS — M2042 Other hammer toe(s) (acquired), left foot: Secondary | ICD-10-CM | POA: Diagnosis not present

## 2021-04-16 DIAGNOSIS — E1142 Type 2 diabetes mellitus with diabetic polyneuropathy: Secondary | ICD-10-CM

## 2021-04-16 NOTE — Progress Notes (Signed)
The patient presented to the office to day to pick up diabetic shoes and 3 pr diabetic custom inserts.  1 pr of  inserts were put in the shoes and the shoes were fitted to the patient.  The patient states they are comfortable and free of defect.  The patient was satisfied with the fit of the shoe.  Instructions for break in and wear were dispensed.  The  delivery documentation and break in instruction forms were signed and a copy of the paperwork was given to the patient.

## 2021-04-19 ENCOUNTER — Encounter: Payer: Self-pay | Admitting: Family Medicine

## 2021-04-19 ENCOUNTER — Ambulatory Visit (INDEPENDENT_AMBULATORY_CARE_PROVIDER_SITE_OTHER): Payer: Medicare Other | Admitting: Family Medicine

## 2021-04-19 ENCOUNTER — Other Ambulatory Visit: Payer: Self-pay

## 2021-04-19 VITALS — BP 126/75 | HR 64 | Ht 66.0 in | Wt 125.0 lb

## 2021-04-19 DIAGNOSIS — R413 Other amnesia: Secondary | ICD-10-CM | POA: Diagnosis not present

## 2021-04-19 DIAGNOSIS — G459 Transient cerebral ischemic attack, unspecified: Secondary | ICD-10-CM | POA: Diagnosis not present

## 2021-04-19 DIAGNOSIS — R9401 Abnormal electroencephalogram [EEG]: Secondary | ICD-10-CM | POA: Diagnosis not present

## 2021-04-19 DIAGNOSIS — R202 Paresthesia of skin: Secondary | ICD-10-CM

## 2021-04-19 DIAGNOSIS — R29818 Other symptoms and signs involving the nervous system: Secondary | ICD-10-CM

## 2021-04-19 NOTE — Progress Notes (Signed)
Chief Complaint  Patient presents with   Follow-up    Rm 6 with spouse Bland Span  Sara Brown is well, Sara Brown got a second opinion advised by dr Rexene Alberts which Sara Brown brought.  Sara Brown is just feeling a abnormal feeling in Sara Brown head but cant describe what it feels like, cant say if is pain or not Sara Brown is just not comfortable.      HISTORY OF PRESENT ILLNESS:  04/19/21 ALL:  Sara Brown is a 80 y.o. female here today for follow up for transient neurologic symptoms. Sara Brown was last seen by Dr Rexene Alberts 11/2020 who continued levetiracetam 500mg  BID and advised second opinion. Sara Brown was seen by Dr Delice Lesch 03/01/2021. Extended EEG showed focal cerebral dysfunction over left temporal region. No obvious epileptic discharges seen. Sara Brown was referred to neurocognitive testing and is scheduled with Dr Melvyn Novas 09/03/21. Since, Sara Brown reports no new symptoms. Sara Brown tell me that Sara Brown continues to have some sort of sensation on the top of Sara Brown head that Sara Brown is unable to describe. Sara Brown can not tell me what it feels like or how often it happen. Sara Brown states that it is not painful. Sara Brown has a tightness of Sara Brown lip and tongue but does not feel it is painful. Unsure how often this happens. No unilateral weakness/numbness, inability to or difficulty talking, or confusion. Sara Brown continues Plavix daily. BP has been stable. Sara Brown reports being taken off hydralazine due to concerns of anorexia. Isosorbide was discontinued due to skin reaction. Sara Brown is now taking nifedipine and tolerating well.   HISTORY (copied from Dr Guadelupe Sabin previous note)  Sara Brown is an 15 -year-old right-handed woman with an underlying medical history of allergic rhinitis, carotid artery stenosis, reflux disease, hypertension hyperlipidemia, vitamin D deficiency, paresthesias, diabetes, and osteoarthritis, who presents for follow-up consultation for a sooner than scheduled appointment after recent hospitalization.  The patient is accompanied by Sara Brown husband today.  I last saw Sara Brown on 10/12/2020 as  a referral from the emergency room due to paresthesias, concern for TIA.  Sara Brown presented to the emergency room on 11/13/2020 with right-sided paresthesias.  Sara Brown was admitted for stroke work-up.  Head CT without contrast on 11/13/2020 showed impression: Negative CT of the head.  Sara Brown complained of cramps in Sara Brown legs.  Venous Doppler ultrasound was negative for DVT.  Sara Brown had a brain MRI without contrast on 11/14/2020 and I reviewed the results: Impression: No acute finding.  Unremarkable study for age.  MR angiogram of the head without contrast showed: Impression: No large vessel occlusion or proximal hemodynamically significant stenosis on this motion limited MRA.  Echocardiogram on 11/15/2020 showed EF of 60 to 65%, no regional wall motion abnormalities.  EEG on 11/14/2020 showed:  IMPRESSION: This study is suggestive of nonspecific cortical dysfunction in left temporal region.  No seizures or epileptiform discharges were seen throughout the recording.   Neurology was consulted.  Sara Brown was advised to continue with Keppra.   For TIA concerns Sara Brown was advised to take baby aspirin and Plavix for a total of 3 weeks and then Plavix only thereafter.   Sara Brown was discharged on 11/15/2020.   Today, 11/29/2020: Sara Brown reports feeling no new symptoms.  Sara Brown has intermittent feeling of tongue swelling without obvious swelling.  Sara Brown has intermittent headaches, Sara Brown has sometimes twitching around Sara Brown eye muscle especially on the left.  Sometimes Sara Brown feels Sara Brown legs are weaker.  Sara Brown feels that the symptoms are correlated with blood pressure fluctuations.  Sara Brown cardiologist has increased Sara Brown isosorbide to  40 mg 3 times daily.  Sara Brown continues to take aspirin and Plavix.    REVIEW OF SYSTEMS: Out of a complete 14 system review of symptoms, the patient complains only of the following symptoms, paresthesias and all other reviewed systems are negative.   ALLERGIES: Allergies  Allergen Reactions   Naproxen Other (See Comments)    Patient  preference  Other reaction(s): upset stomach   Prednisone Other (See Comments)    Patient Preference  Other reaction(s): upset stomach   Amlodipine Hives   Losartan Rash     HOME MEDICATIONS: Outpatient Medications Prior to Visit  Medication Sig Dispense Refill   ADVAIR DISKUS 100-50 MCG/DOSE AEPB Inhale 1 puff into the lungs 2 (two) times daily.     albuterol (PROVENTIL HFA;VENTOLIN HFA) 108 (90 Base) MCG/ACT inhaler Inhale 1 puff into the lungs every 4 (four) hours as needed for wheezing.     alendronate (FOSAMAX) 70 MG tablet Take 70 mg by mouth every Saturday.     Calcium Citrate-Vitamin D 315-250 MG-UNIT TABS Take 2 tablets by mouth daily.     carboxymethylcellulose (REFRESH PLUS) 0.5 % SOLN Place 1 drop into both eyes in the morning and at bedtime.     Cholecalciferol (VITAMIN D3) 10 MCG (400 UNIT) CAPS Take 800 Units by mouth daily.     Clobetasol Prop Emollient Base (CLOBETASOL PROPIONATE E) 0.05 % emollient cream Apply 1 application topically 2 (two) times daily as needed. (Patient taking differently: Apply 1 application topically 2 (two) times daily as needed (rash under breast).) 60 g 6   cloNIDine (CATAPRES) 0.1 MG tablet TAKE 1 TABLET BY MOUTH AT BEDTIME , MAY TAKE ADDITIONAL 1 TAB IF BLOOD PRESSURE IS HIGH 180 tablet 1   clopidogrel (PLAVIX) 75 MG tablet Take 75 mg by mouth daily.     Coenzyme Q10 (COQ10) 200 MG CAPS Take 200 mg by mouth daily.     cycloSPORINE (RESTASIS) 0.05 % ophthalmic emulsion Place 1 drop into both eyes 2 (two) times daily.      fluticasone (FLONASE) 50 MCG/ACT nasal spray Place 1 spray into both nostrils in the morning and at bedtime.     Garlic 2130 MG CAPS Take 1,000 mg by mouth daily as needed (high blood pressure).     levETIRAcetam (KEPPRA) 500 MG tablet Take 1 tablet (500 mg total) by mouth every 12 (twelve) hours. 60 tablet 5   Magnesium 250 MG TABS Take 250 mg by mouth daily.     Multiple Vitamins-Minerals (MULTIVITAMIN WITH MINERALS) tablet  Take 1 tablet by mouth daily. Centrum for Women     NIFEdipine (PROCARDIA-XL/NIFEDICAL-XL) 30 MG 24 hr tablet Take 1 tablet (30 mg total) by mouth daily. 30 tablet 1   ofloxacin (OCUFLOX) 0.3 % ophthalmic solution      omeprazole (PRILOSEC OTC) 20 MG tablet Take 20 mg by mouth daily as needed (for reflux).      Pitavastatin Calcium (LIVALO) 2 MG TABS Take 1 tablet (2 mg total) by mouth daily. 30 tablet 3   polyethylene glycol (MIRALAX / GLYCOLAX) packet Take 17 g by mouth daily as needed for moderate constipation.     pyridOXINE (VITAMIN B-6) 100 MG tablet Take 100 mg by mouth daily.      spironolactone (ALDACTONE) 50 MG tablet Take 50 mg by mouth daily.      thiamine 100 MG tablet Take 100 mg by mouth daily.     triamcinolone cream (KENALOG) 0.1 % Apply 1 application topically 2 (two) times  daily as needed (rash/ eczema).   2   No facility-administered medications prior to visit.     PAST MEDICAL HISTORY: Past Medical History:  Diagnosis Date   Allergic rhinitis    Arthritis    "fingers, knees" (02/09/2018)   Asthma    "stopped taking RX after dr said I don't have this" (02/09/2018)   Carotid stenosis    ICA(L)   Complication of anesthesia    "felt the cut w/one of my c-sections" (02/09/2018)   Constipation    GERD (gastroesophageal reflux disease)    Headache    "years ago; before menopause" (02/09/2018)   Hyperlipidemia    Hypertension    Neuropathy    face (R)   OA (osteoarthritis)    Osteoporosis    TIA (transient ischemic attack) ?2005   Type II diabetes mellitus (HCC)    Vitamin D deficiency      PAST SURGICAL HISTORY: Past Surgical History:  Procedure Laterality Date   CATARACT EXTRACTION W/ INTRAOCULAR LENS  IMPLANT, BILATERAL Bilateral    CESAREAN SECTION  1971; 1975   INTRAVASCULAR PRESSURE WIRE/FFR STUDY N/A 04/01/2018   Procedure: INTRAVASCULAR PRESSURE WIRE/FFR STUDY;  Surgeon: Nigel Mormon, MD;  Location: [facility];  Service: Cardiovascular;   Laterality: N/A;   LEFT HEART CATH AND CORONARY ANGIOGRAPHY N/A 04/01/2018   Procedure: LEFT HEART CATH AND CORONARY ANGIOGRAPHY;  Surgeon: Nigel Mormon, MD;  Location: [facility];  Service: Cardiovascular;  Laterality: N/A;   TUBAL LIGATION  1975   TYMPANOSTOMY TUBE PLACEMENT Left 03/01/2021     FAMILY HISTORY: Family History  Problem Relation Age of Onset   Diabetes Mellitus II Mother    Osteoporosis Mother    Hypertension Mother    Diabetes Mellitus II Father    CVA Sister        22   CAD Sister        4   Hypertension Sister        4   Osteoarthritis Sister        4   Alcoholism Brother    Breast cancer Cousin 58     SOCIAL HISTORY: Social History   Socioeconomic History   Marital status: Married    Spouse name: Bland Span   Number of children: 2   Years of education: 16   Highest education level: Bachelor's degree (e.g., BA, AB, BS)  Occupational History    Comment: employed at home  Tobacco Use   Smoking status: Never   Smokeless tobacco: Never  Vaping Use   Vaping Use: Never used  Substance and Sexual Activity   Alcohol use: Never   Drug use: Never   Sexual activity: Not Currently  Other Topics Concern   Not on file  Social History Narrative   Consumes no caffeine   Right handed    Social Determinants of Health   Financial Resource Strain: Not on file  Food Insecurity: Not on file  Transportation Needs: Not on file  Physical Activity: Not on file  Stress: Not on file  Social Connections: Not on file  Intimate Partner Violence: Not on file     PHYSICAL EXAM  Vitals:   04/19/21 1011  BP: 126/75  Pulse: 64  Weight: 125 lb (56.7 kg)  Height: 5\' 6"  (1.676 m)   Body mass index is 20.18 kg/m.  Generalized: Well developed, in no acute distress  Cardiology: normal rate and rhythm, no murmur auscultated  Respiratory: clear to auscultation bilaterally  Neurological examination  Mentation: Alert oriented to time, place,  history taking. Follows all commands speech and language fluent Cranial nerve II-XII: Pupils were equal round reactive to light. Extraocular movements were full, visual field were full on confrontational test. Facial sensation and strength were normal. Head turning and shoulder shrug  were normal and symmetric. Motor: The motor testing reveals 5 over 5 strength of all 4 extremities. Good symmetric motor tone is noted throughout.  Sensory: Sensory testing is intact to soft touch on all 4 extremities. No evidence of extinction is noted.  Coordination: Cerebellar testing reveals good finger-nose-finger and heel-to-shin bilaterally.  Gait and station: Gait is normal.    DIAGNOSTIC DATA (LABS, IMAGING, TESTING) - I reviewed patient records, labs, notes, testing and imaging myself where available.  Lab Results  Component Value Date   WBC 4.3 03/18/2021   HGB 12.4 03/18/2021   HCT 36.7 03/18/2021   MCV 87.2 03/18/2021   PLT 243 03/18/2021      Component Value Date/Time   NA 133 (L) 03/18/2021 1920   NA 138 08/19/2019 1123   K 3.9 03/18/2021 1920   CL 96 (L) 03/18/2021 1920   CO2 27 03/18/2021 1920   GLUCOSE 106 (H) 03/18/2021 1920   BUN 12 03/18/2021 1920   BUN 16 08/19/2019 1123   CREATININE 0.86 03/18/2021 1920   CALCIUM 10.1 03/18/2021 1920   PROT 7.5 03/18/2021 1920   PROT 7.1 08/19/2019 1123   ALBUMIN 3.9 03/18/2021 1920   ALBUMIN 4.6 08/19/2019 1123   AST 32 03/18/2021 1920   ALT 34 03/18/2021 1920   ALKPHOS 45 03/18/2021 1920   BILITOT 1.0 03/18/2021 1920   BILITOT 0.6 08/19/2019 1123   GFRNONAA >60 03/18/2021 1920   GFRAA >60 10/11/2019 1251   Lab Results  Component Value Date   CHOL 180 11/14/2020   HDL 62 11/14/2020   LDLCALC 101 (H) 11/14/2020   TRIG 85 11/14/2020   CHOLHDL 2.9 11/14/2020   Lab Results  Component Value Date   HGBA1C 5.9 (H) 11/14/2020   No results found for: [id number] Lab Results  Component Value Date   TSH 3.258 09/05/2020    No  flowsheet data found.   No flowsheet data found.   ASSESSMENT AND PLAN  80 y.o. year old female  has a past medical history of Allergic rhinitis, Arthritis, Asthma, Carotid stenosis, Complication of anesthesia, Constipation, GERD (gastroesophageal reflux disease), Headache, Hyperlipidemia, Hypertension, Neuropathy, OA (osteoarthritis), Osteoporosis, TIA (transient ischemic attack) (?2005), Type II diabetes mellitus ([place]), and Vitamin D deficiency. here with    Transient neurological symptoms  TIA (transient ischemic attack)  Memory loss  Abnormal EEG  Paresthesias  Sara reports no new or worsening symptoms since last follow up. Sara Brown continues to have an unusual sensation of the top of Sara Brown head and tip/tongue but unable to describe what it feels like or how often it occurs. It does not limit Sara Brown activity. Sara Brown is not sure it has gotten any better on levetiracetam but reports that Dr Delice Lesch asked Sara Brown to continue levetiracetam for now. Per Sara Brown note, Sara Brown suggested follow up pending EEG. I have advised Sara Brown call to discuss continuation with Dr Delice Lesch. Sara Brown was encouraged to follow up closely with PCP for co morbidity management. Stroke prevention reviewed. Memory compensation strategies reviewed. Sara Brown was encouraged to follow up with Dr Melvyn Novas as scheduled. Healthy lifestyle habits recommended. Sara Brown will follow up with [place] as needed.    No orders of the defined types were placed in  this encounter.    No orders of the defined types were placed in this encounter.     Debbora Presto, MSN, FNP-C 04/19/2021, 10:18 AM  [facility] [address], [place], [place] [address] [phone]

## 2021-04-19 NOTE — Patient Instructions (Addendum)
Below is our plan:  We will continue levetiracetam per Dr Aquino's direction. Call her office to see if she recommends follow up before cognitive testing. Continue Plavix per PCP direction. I do recommend trying mirtazapine per PCP direction.   Please make sure you are staying well hydrated. I recommend 50-60 ounces daily. Well balanced diet and regular exercise encouraged. Consistent sleep schedule with 6-8 hours recommended.   Please continue follow up with care team as directed.   Follow up with [place] as needed   You may receive a survey regarding today's visit. I encourage you to leave honest feed back as I do use this information to improve patient care. Thank you for seeing me today!   Management of Memory Problems   There are some general things you can do to help manage your memory problems.  Your memory may not in fact recover, but by using techniques and strategies you will be able to manage your memory difficulties better.   1)  Establish a routine. Try to establish and then stick to a regular routine.  By doing this, you will get used to what to expect and you will reduce the need to rely on your memory.  Also, try to do things at the same time of day, such as taking your medication or checking your calendar first thing in the morning. Think about think that you can do as a part of a regular routine and make a list.  Then enter them into a daily planner to remind you.  This will help you establish a routine.   2)  Organize your environment. Organize your environment so that it is uncluttered.  Decrease visual stimulation.  Place everyday items such as keys or cell phone in the same place every day (ie.  Basket next to front door) Use post it notes with a brief message to yourself (ie. Turn off light, lock the door) Use labels to indicate where things go (ie. Which cupboards are for food, dishes, etc.) Keep a notepad and pen by the telephone to take messages   3)  Memory Aids A  diary or journal/notebook/daily planner Making a list (shopping list, chore list, to do list that needs to be done) Using an alarm as a reminder (kitchen timer or cell phone alarm) Using cell phone to store information (Notes, Calendar, Reminders) Calendar/White board placed in a prominent position Post-it notes   In order for memory aids to be useful, you need to have good habits.  It's no good remembering to make a note in your journal if you don't remember to look in it.  Try setting aside a certain time of day to look in journal.   4)  Improving mood and managing fatigue. There may be other factors that contribute to memory difficulties.  Factors, such as anxiety, depression and tiredness can affect memory. Regular gentle exercise can help improve your mood and give you more energy. Simple relaxation techniques may help relieve symptoms of anxiety Try to get back to completing activities or hobbies you enjoyed doing in the past. Learn to pace yourself through activities to decrease fatigue. Find out about some local support groups where you can share experiences with others. Try and achieve 7-8 hours of sleep at night.

## 2021-04-29 ENCOUNTER — Other Ambulatory Visit: Payer: Self-pay | Admitting: Cardiology

## 2021-05-09 ENCOUNTER — Telehealth: Payer: Self-pay | Admitting: Neurology

## 2021-05-09 NOTE — Telephone Encounter (Signed)
Pt called and informed If no recent or emergent issues,  is best to see her after neuropsych evaluation so Dr. Delice Lesch can have that information available

## 2021-05-09 NOTE — Telephone Encounter (Signed)
Pt saw [facility] 04/19/21 [place], the NP told pt to call aquino to see if she needs to be seen here before her neuro testing which is in march.

## 2021-05-11 DIAGNOSIS — H40023 Open angle with borderline findings, high risk, bilateral: Secondary | ICD-10-CM | POA: Diagnosis not present

## 2021-05-17 ENCOUNTER — Telehealth: Payer: Self-pay | Admitting: Family Medicine

## 2021-05-17 NOTE — Telephone Encounter (Signed)
Called the patient back.  Patient states that over the last couple of days she started to notice increase in the pressure that she has on her head.  Her blood pressure has been more elevated than normal.  Today she had a pain that came and went on the left side of her head and the posterior head as well.  She states that the sensation is still present but the pain has gone.  She describes the tongue/lips/face having a heavy feeling.  She states this is typically also on the left side but sometimes it also on the right side.  Her blood pressure range has been on 6th 149/88, 7th 142/82, 8th 138/80.  Which is elevated for her.  She did recently have an EEG completed with Dr. Delice Lesch who completed the second opinion and it did also reflect abnormal motion on the left side of her brain which was similar to what the EEG in the hospital showed.  Advised the patient in reviewing her chart from the previous visits the sensation that she is describing seems very similar to what she has described previously.  She agreed with that statement, the pain is the only factor that was new but again that has went away at this time.  I asked the patient to smile in the mirror to see if she notices any asymmetry in her face she declined.  Her speech sounds normal.  There was no slurring or difficulty with getting her words out and she denied having that prior to calling.  Advised the patient she should continue to monitor her symptoms, keep an eye on her blood pressure, and if these continue to worsen or if she notices any signs of stroke she should go to the ER.  Advised the patient to take her medications as prescribed she verbalized understanding.  Informed the patient I would pass this information along to Amy and if she has any thoughts or advice/recommendations we will contact her. Pt verbalized understanding.

## 2021-05-17 NOTE — Telephone Encounter (Signed)
Pt states something is going on in her head, she is having pain.  Pt states its on the left side & back part of her head.  Pt stated also her Blood pressure is going up since yesterday.  Pt was asked if she feels she needs to go to the ED she said she would just lay down a while and take a Garlic tablet to see if that would help.  Pt is asking for a call re: whatever it is that is going on with her head.

## 2021-05-17 NOTE — Telephone Encounter (Signed)
I really do not have any additional recommendations, I would recommend follow-up with Dr. Delice Lesch, if she is agreeable.  Please encourage patient to call Dr. Amparo Bristol office.

## 2021-05-18 DIAGNOSIS — H903 Sensorineural hearing loss, bilateral: Secondary | ICD-10-CM | POA: Diagnosis not present

## 2021-05-21 ENCOUNTER — Encounter: Payer: Self-pay | Admitting: Neurology

## 2021-05-28 DIAGNOSIS — R634 Abnormal weight loss: Secondary | ICD-10-CM | POA: Diagnosis not present

## 2021-05-28 DIAGNOSIS — E1165 Type 2 diabetes mellitus with hyperglycemia: Secondary | ICD-10-CM | POA: Diagnosis not present

## 2021-05-28 DIAGNOSIS — I10 Essential (primary) hypertension: Secondary | ICD-10-CM | POA: Diagnosis not present

## 2021-05-28 DIAGNOSIS — E7800 Pure hypercholesterolemia, unspecified: Secondary | ICD-10-CM | POA: Diagnosis not present

## 2021-05-28 DIAGNOSIS — M792 Neuralgia and neuritis, unspecified: Secondary | ICD-10-CM | POA: Diagnosis not present

## 2021-05-29 DIAGNOSIS — E1165 Type 2 diabetes mellitus with hyperglycemia: Secondary | ICD-10-CM | POA: Diagnosis not present

## 2021-05-30 ENCOUNTER — Ambulatory Visit
Admission: RE | Admit: 2021-05-30 | Discharge: 2021-05-30 | Disposition: A | Discharge status: Home / Self Care | Payer: Medicare Other | Source: Ambulatory Visit | Attending: Physician Assistant | Admitting: Physician Assistant

## 2021-05-30 ENCOUNTER — Other Ambulatory Visit: Payer: Self-pay | Admitting: Physician Assistant

## 2021-05-30 DIAGNOSIS — M25511 Pain in right shoulder: Secondary | ICD-10-CM | POA: Diagnosis not present

## 2021-06-15 DIAGNOSIS — M25511 Pain in right shoulder: Secondary | ICD-10-CM | POA: Diagnosis not present

## 2021-06-18 ENCOUNTER — Encounter: Payer: Self-pay | Admitting: Dermatology

## 2021-06-18 ENCOUNTER — Ambulatory Visit (INDEPENDENT_AMBULATORY_CARE_PROVIDER_SITE_OTHER): Payer: Medicare Other | Admitting: Dermatology

## 2021-06-18 ENCOUNTER — Other Ambulatory Visit: Payer: Self-pay

## 2021-06-18 DIAGNOSIS — I2510 Atherosclerotic heart disease of native coronary artery without angina pectoris: Secondary | ICD-10-CM | POA: Diagnosis not present

## 2021-06-18 DIAGNOSIS — L309 Dermatitis, unspecified: Secondary | ICD-10-CM | POA: Diagnosis not present

## 2021-06-18 MED ORDER — CLOBETASOL PROP EMOLLIENT BASE 0.05 % EX CREA: 1.0000 "application " | TOPICAL_CREAM | Freq: Two times a day (BID) | CUTANEOUS | 6 refills | Status: DC | PRN

## 2021-06-19 ENCOUNTER — Ambulatory Visit (INDEPENDENT_AMBULATORY_CARE_PROVIDER_SITE_OTHER): Payer: Medicare Other | Admitting: Podiatry

## 2021-06-19 DIAGNOSIS — E119 Type 2 diabetes mellitus without complications: Secondary | ICD-10-CM | POA: Diagnosis not present

## 2021-06-19 DIAGNOSIS — M79674 Pain in right toe(s): Secondary | ICD-10-CM | POA: Diagnosis not present

## 2021-06-19 DIAGNOSIS — L84 Corns and callosities: Secondary | ICD-10-CM | POA: Diagnosis not present

## 2021-06-19 DIAGNOSIS — B351 Tinea unguium: Secondary | ICD-10-CM | POA: Diagnosis not present

## 2021-06-19 DIAGNOSIS — M79675 Pain in left toe(s): Secondary | ICD-10-CM | POA: Diagnosis not present

## 2021-06-19 DIAGNOSIS — E1142 Type 2 diabetes mellitus with diabetic polyneuropathy: Secondary | ICD-10-CM | POA: Diagnosis not present

## 2021-06-19 DIAGNOSIS — M2041 Other hammer toe(s) (acquired), right foot: Secondary | ICD-10-CM

## 2021-06-19 DIAGNOSIS — M2042 Other hammer toe(s) (acquired), left foot: Secondary | ICD-10-CM

## 2021-06-24 ENCOUNTER — Encounter: Payer: Self-pay | Admitting: Podiatry

## 2021-06-24 NOTE — Progress Notes (Signed)
ANNUAL DIABETIC FOOT EXAM  Subjective: Sara Brown presents today for for annual diabetic foot examination and callus(es) bilaterally and painful thick toenails that are difficult to trim. Painful toenails interfere with ambulation. Aggravating factors include wearing enclosed shoe gear. Pain is relieved with periodic professional debridement. Painful calluses are aggravated when weightbearing with and without shoegear. Pain is relieved with periodic professional debridement..  Patient denies any h/o foot wounds.She is concerned about discolored areas on anterior aspect of her ankles as well as dorsal aspect of her toes. States her diabetic shoes may be the problem. She is not wearing them on today.  Patient denies any numbness, tingling, burning, or pins/needle sensation in feet.  Seward Carol, MD is patient's PCP. Last visit was 05/01/2021.  Past Medical History:  Diagnosis Date   Allergic rhinitis    Arthritis    "fingers, knees" (02/09/2018)   Asthma    "stopped taking RX after dr said I don't have this" (02/09/2018)   Basal cell carcinoma    Carotid stenosis    ICA(L)   Complication of anesthesia    "felt the cut w/one of my c-sections" (02/09/2018)   Constipation    GERD (gastroesophageal reflux disease)    Headache    "years ago; before menopause" (02/09/2018)   Hyperlipidemia    Hypertension    Neuropathy    face (R)   OA (osteoarthritis)    Osteoporosis    TIA (transient ischemic attack) ?2005   Type II diabetes mellitus ([place])    Vitamin D deficiency    Patient Active Problem List   Diagnosis Date Noted   Myringotomy tube status 04/03/2021   Palpitations 03/16/2021   Hyperglycemia due to type 2 diabetes mellitus ([place]) 03/09/2021   Neuropathic pain 03/09/2021   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   Mixed hyperlipidemia 12/29/2020   TIA  (transient ischemic attack) 11/13/2020   Paresthesias 09/05/2020   Age-related osteoporosis without current pathological fracture 08/18/2020   Pure hypercholesterolemia 08/18/2020   Chronic constipation 08/18/2020   Right ear impacted cerumen 10/26/2019   Coronary artery disease involving native coronary artery of native heart without angina pectoris 09/17/2018   Diabetes mellitus type 2, controlled ([place]) 03/31/2018   Essential hypertension 03/31/2018   Dyslipidemia 03/31/2018   Chest pain 02/10/2018   Chest pain due to coronary artery disease ([place]) 02/09/2018   Angina at rest [facility]) 02/09/2018   Bradycardia 02/09/2018   Skin laxity 06/12/2017   Hirsutism 06/12/2017   Hyperpigmentation 02/13/2017   Neck mass 01/17/2017   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   Asymmetrical hearing loss of left ear 02/21/2016   Ear pain, left 02/21/2016   GERD (gastroesophageal reflux disease)    Shortness of breath 07/30/2007   Cough 07/30/2007   Past Surgical History:  Procedure Laterality Date   CATARACT EXTRACTION W/ INTRAOCULAR LENS  IMPLANT, BILATERAL Bilateral    CESAREAN SECTION  1971; 1975   INTRAVASCULAR PRESSURE WIRE/FFR STUDY N/A 04/01/2018   Procedure: INTRAVASCULAR PRESSURE WIRE/FFR STUDY;  Surgeon: Nigel Mormon, MD;  Location: [place] [facility];  Service: Cardiovascular;  Laterality: N/A;   LEFT HEART CATH AND CORONARY ANGIOGRAPHY N/A 04/01/2018   Procedure: LEFT HEART CATH AND CORONARY ANGIOGRAPHY;  Surgeon: Nigel Mormon, MD;  Location: [place] [facility];  Service: Cardiovascular;  Laterality: N/A;   TUBAL LIGATION  1975   TYMPANOSTOMY TUBE PLACEMENT Left 03/01/2021  Current Outpatient Medications on File Prior to Visit  Medication Sig Dispense Refill   ADVAIR DISKUS 100-50 MCG/DOSE AEPB Inhale 1 puff into the lungs 2 (two) times daily.     albuterol (PROVENTIL HFA;VENTOLIN HFA) 108 (90 Base) MCG/ACT inhaler Inhale 1 puff into the lungs every 4 (four) hours as  needed for wheezing.     alendronate (FOSAMAX) 70 MG tablet Take 70 mg by mouth every Saturday.     Calcium Citrate-Vitamin D 315-250 MG-UNIT TABS Take 2 tablets by mouth daily.     carboxymethylcellulose (REFRESH PLUS) 0.5 % SOLN Place 1 drop into both eyes in the morning and at bedtime.     Cholecalciferol (VITAMIN D3) 10 MCG (400 UNIT) CAPS Take 800 Units by mouth daily.     Clobetasol Prop Emollient Base (CLOBETASOL PROPIONATE E) 0.05 % emollient cream Apply 1 application topically 2 (two) times daily as needed. 60 g 6   cloNIDine (CATAPRES) 0.1 MG tablet TAKE 1 TABLET BY MOUTH AT BEDTIME , MAY TAKE ADDITIONAL 1 TAB IF BLOOD PRESSURE IS HIGH 180 tablet 1   clopidogrel (PLAVIX) 75 MG tablet Take 75 mg by mouth daily.     Coenzyme Q10 (COQ10) 200 MG CAPS Take 200 mg by mouth daily.     cycloSPORINE (RESTASIS) 0.05 % ophthalmic emulsion Place 1 drop into both eyes 2 (two) times daily.      fluticasone (FLONASE) 50 MCG/ACT nasal spray Place 1 spray into both nostrils in the morning and at bedtime.     fluticasone-salmeterol (ADVAIR DISKUS) 100-50 MCG/ACT AEPB 1 puff     Garlic 7209 MG CAPS Take 1,000 mg by mouth daily as needed (high blood pressure).     levETIRAcetam (KEPPRA) 500 MG tablet Take 1 tablet (500 mg total) by mouth every 12 (twelve) hours. 60 tablet 5   Magnesium 250 MG TABS Take 250 mg by mouth daily.     mirtazapine (REMERON) 15 MG tablet      Multiple Vitamins-Minerals (CENTRUM SILVER) CHEW See admin instructions.     NIFEdipine (ADALAT CC) 30 MG 24 hr tablet 1 tablet on an empty stomach     NIFEdipine (PROCARDIA-XL/NIFEDICAL-XL) 30 MG 24 hr tablet TAKE 1 TABLET BY MOUTH EVERY DAY 90 tablet 1   ofloxacin (OCUFLOX) 0.3 % ophthalmic solution      omeprazole (PRILOSEC OTC) 20 MG tablet Take 20 mg by mouth daily as needed (for reflux).      Pitavastatin Calcium (LIVALO) 2 MG TABS Take 1 tablet (2 mg total) by mouth daily. 30 tablet 3   polyethylene glycol (MIRALAX / GLYCOLAX) packet  Take 17 g by mouth daily as needed for moderate constipation.     pyridOXINE (VITAMIN B-6) 100 MG tablet Take 100 mg by mouth daily.      spironolactone (ALDACTONE) 50 MG tablet Take 50 mg by mouth daily.      thiamine 100 MG tablet Take 100 mg by mouth daily.     triamcinolone cream (KENALOG) 0.1 % Apply 1 application topically 2 (two) times daily as needed (rash/ eczema).   2   No current facility-administered medications on file prior to visit.    Allergies  Allergen Reactions   Naproxen Other (See Comments)    Patient preference  Other reaction(s): upset stomach   Prednisone Other (See Comments)    Patient Preference  Other reaction(s): upset stomach   Amlodipine Hives   Losartan Rash   Social History   Occupational History    Comment: employed  at home  Tobacco Use   Smoking status: Never   Smokeless tobacco: Never  Vaping Use   Vaping Use: Never used  Substance and Sexual Activity   Alcohol use: Never   Drug use: Never   Sexual activity: Not Currently   Family History  Problem Relation Age of Onset   Diabetes Mellitus II Mother    Osteoporosis Mother    Hypertension Mother    Diabetes Mellitus II Father    CVA Sister        65   CAD Sister        4   Hypertension Sister        4   Osteoarthritis Sister        4   Alcoholism Brother    Breast cancer Cousin 60   Immunization History  Administered Date(s) Administered   Tdap 07/10/2013     Review of Systems: Negative except as noted in the HPI.   Objective: There were no vitals filed for this visit.  Kayslee Furey is a pleasant 81 y.o. female in NAD. AAO X 3.  Vascular Examination: CFT immediate b/l LE. Palpable DP/PT pulses b/l LE. Digital hair absent b/l. Skin temperature gradient WNL b/l. No pain with calf compression b/l. No edema noted b/l. No cyanosis or clubbing noted b/l LE.  Dermatological Examination: Pedal integument with normal turgor, texture and tone b/l LE. No open wounds b/l.  No interdigital macerations b/l. Toenails 1-5 b/l elongated, thickened, discolored with subungual debris. +Tenderness with dorsal palpation of nailplates. Hyperkeratotic lesion(s) noted submet head 2 b/l.  Musculoskeletal Examination: Normal muscle strength 5/5 to all lower extremity muscle groups bilaterally. Hammertoe deformity noted 2-5 b/l.Marland Kitchen No pain, crepitus or joint limitation noted with ROM b/l LE.  Patient ambulates independently without assistive aids.  Footwear Assessment: Does the patient wear appropriate shoes? No. Does the patient need inserts/orthotics? Yes.  Neurological Examination: Protective sensation decreased with 10 gram monofilament b/l. Vibratory sensation intact b/l. Proprioception intact bilaterally.  Hemoglobin A1C Latest Ref Rng & Units 11/14/2020 09/05/2020  HGBA1C 4.8 - 5.6 % 5.9(H) 6.4(H)  Some recent data might be hidden   Assessment: 1. Pain due to onychomycosis of toenails of both feet   2. Callus   3. Hammer toes of both feet   4. Diabetic peripheral neuropathy associated with type 2 diabetes mellitus ([place])   5. Encounter for diabetic foot exam ([place])      ADA Risk Categorization: High Risk  Patient has one or more of the following: Loss of protective sensation Absent pedal pulses Severe Foot deformity History of foot ulcer  Plan: -Patient was asked to bring in diabetic shoes to [facility] for assessment. She is also wearing boots with inadequate toe box which could also be rubbing her toes as well as anterior aspect of ankles. -Continue foot and shoe inspections daily. Monitor blood glucose per PCP/Endocrinologist's recommendations. -Mycotic toenails 1-5 bilaterally were debrided in length and girth with sterile nail nippers and dremel without incident. -Callus(es) submet head 2 b/l pared utilizing sterile scalpel blade without complication or incident. Total number debrided =2. -Patient/POA to call should there be question/concern in the  interim.  Return in about 3 months (around 09/17/2021).  Marzetta Board, DPM

## 2021-06-26 ENCOUNTER — Other Ambulatory Visit: Payer: Self-pay | Admitting: Cardiology

## 2021-06-26 DIAGNOSIS — E782 Mixed hyperlipidemia: Secondary | ICD-10-CM

## 2021-06-26 DIAGNOSIS — I2510 Atherosclerotic heart disease of native coronary artery without angina pectoris: Secondary | ICD-10-CM

## 2021-06-27 ENCOUNTER — Ambulatory Visit: Payer: Medicare Other

## 2021-06-27 ENCOUNTER — Other Ambulatory Visit: Payer: Self-pay

## 2021-06-27 DIAGNOSIS — E1142 Type 2 diabetes mellitus with diabetic polyneuropathy: Secondary | ICD-10-CM

## 2021-06-27 DIAGNOSIS — M2042 Other hammer toe(s) (acquired), left foot: Secondary | ICD-10-CM

## 2021-06-27 DIAGNOSIS — M2041 Other hammer toe(s) (acquired), right foot: Secondary | ICD-10-CM

## 2021-06-27 NOTE — Progress Notes (Signed)
SITUATION Reason for Consult: Evaluation for Prefabricated Diabetic Shoes and Bilateral Custom Diabetic Inserts. Patient / Caregiver Report: Patient would like well fitting shoes  OBJECTIVE DATA: Patient History / Diagnosis:    ICD-10-CM   1. Hammer toes of both feet  M20.41    M20.42     2. Diabetic peripheral neuropathy associated with type 2 diabetes mellitus (HCC)  E11.42       Current or Previous Devices:   Patient has had diabetic shoes for years  In-Person Foot Examination: Ulcers & Callousing:   None  Toe / Foot Deformities:   - Hammertoes   Shoe Size: 10.5W  ORTHOTIC RECOMMENDATION Recommended Devices: - 1x pair prefabricated PDAC approved diabetic shoes: X801W 10.5W - 3x pair custom-to-patient vacuum formed diabetic insoles.   GOALS OF SHOES AND INSOLES - Reduce shear and pressure - Reduce / Prevent callus formation - Reduce / Prevent ulceration - Protect the fragile healing compromised diabetic foot.  Patient would benefit from diabetic shoes and inserts as patient has diabetes mellitus and the patient has one or more of the following conditions: - Peripheral neuropathy with evidence of callus formation - Foot deformity - Poor circulation  ACTIONS PERFORMED Patient was casted for insoles via crush box and measured for shoes via brannock device. Procedure was explained and patient tolerated procedure well. All questions were answered and concerns addressed.  PLAN Patient is to ensure treating physician receives and completes diabetic paperwork. Casts and shoe order are to be held until paperwork is received. Once received patient is to be scheduled for fitting in four weeks.

## 2021-06-28 ENCOUNTER — Telehealth: Payer: Self-pay | Admitting: Dermatology

## 2021-06-28 DIAGNOSIS — L309 Dermatitis, unspecified: Secondary | ICD-10-CM

## 2021-06-28 NOTE — Telephone Encounter (Signed)
Patient is calling to say that the prescription for Clobetasol Prop Emollient Base Clobetasol Prop Emollient Base (CLOBETASOL PROPIONATE E) 0.05 % emollient cream Costs too much for her and would like something else sent in.  Patient states that she has used Halobetasol in the past.  [facility] [place] [place], [place] [address] (Ph: [phone]

## 2021-07-02 DIAGNOSIS — S46011D Strain of muscle(s) and tendon(s) of the rotator cuff of right shoulder, subsequent encounter: Secondary | ICD-10-CM | POA: Diagnosis not present

## 2021-07-02 DIAGNOSIS — M19011 Primary osteoarthritis, right shoulder: Secondary | ICD-10-CM | POA: Diagnosis not present

## 2021-07-02 DIAGNOSIS — R531 Weakness: Secondary | ICD-10-CM | POA: Diagnosis not present

## 2021-07-02 DIAGNOSIS — M25611 Stiffness of right shoulder, not elsewhere classified: Secondary | ICD-10-CM | POA: Diagnosis not present

## 2021-07-02 MED ORDER — CLOBETASOL PROP EMOLLIENT BASE 0.05 % EX CREA: 1.0000 "application " | TOPICAL_CREAM | Freq: Two times a day (BID) | CUTANEOUS | 6 refills | Status: DC | PRN

## 2021-07-02 NOTE — Telephone Encounter (Signed)
This encounter was created in error - please disregard.

## 2021-07-02 NOTE — Telephone Encounter (Signed)
Phone call to [facility] to inform them that the prescription for the patient's Clobetasol was suppose to be for the plan Clobetasol and not the Emollient Base. Per Pharmacy Tech they will correct the prescription and get it filled.

## 2021-07-02 NOTE — Telephone Encounter (Signed)
Phone call to patient to inform her that the medication has been fixed by the Pharmacy and she's now able to pick it up.

## 2021-07-02 NOTE — Telephone Encounter (Signed)
I spoke with patient and informed her that I was waiting on the Pharmacy to open back up from lunch and I would call her back after I spoke with someone. Patient aware.

## 2021-07-02 NOTE — Telephone Encounter (Signed)
Phone call to the pharmacy to see why the prescription was so much since [facility]Rx has the prescription listed for under $20. Pharmacy closed for lunch.

## 2021-07-02 NOTE — Addendum Note (Signed)
Addended by: Lennie Odor on: 07/02/2021 11:01 AM   Modules accepted: Orders

## 2021-07-02 NOTE — Telephone Encounter (Signed)
Phone call from patient stating that her husband went to the Pharmacy to pick up the Clobetasol prescription and he was told that the prescription was $60 instead of $18.

## 2021-07-02 NOTE — Telephone Encounter (Signed)
Phone call to patient to inform her that we will can send in the Clobetasol prescription to a [facility] close to her and she can pay cash for the prescription $18.06.  Patient aware.

## 2021-07-05 DIAGNOSIS — M19011 Primary osteoarthritis, right shoulder: Secondary | ICD-10-CM | POA: Diagnosis not present

## 2021-07-05 DIAGNOSIS — M25611 Stiffness of right shoulder, not elsewhere classified: Secondary | ICD-10-CM | POA: Diagnosis not present

## 2021-07-05 DIAGNOSIS — R531 Weakness: Secondary | ICD-10-CM | POA: Diagnosis not present

## 2021-07-05 DIAGNOSIS — S46011D Strain of muscle(s) and tendon(s) of the rotator cuff of right shoulder, subsequent encounter: Secondary | ICD-10-CM | POA: Diagnosis not present

## 2021-07-06 NOTE — Progress Notes (Signed)
Follow up visit  Subjective:   Sara Brown, female    DOB: 02-18-1941, 81 y.o.   MRN: [id number]   Chief Complaint  Patient presents with   Hypertension   Follow-up    3 month    81 y/o African-American female with hypertension, diet controlled diabetes, hyperlipidemia,  GERD and remote history of TIA, mild nonobstructive coronary artery disease (Cath 04/01/2018).  Blood pressures well controlled.  Patient tells me that she takes garlic tablet every afternoon between 3-4 PM that seems to control her blood pressure well.  She has occasional "moving sensation" in her calfs.  She is unable to provide additional details regarding aggravating and relieving factors, or duration of symptoms.  Current Outpatient Medications on File Prior to Visit  Medication Sig Dispense Refill   ADVAIR DISKUS 100-50 MCG/DOSE AEPB Inhale 1 puff into the lungs 2 (two) times daily.     albuterol (PROVENTIL HFA;VENTOLIN HFA) 108 (90 Base) MCG/ACT inhaler Inhale 1 puff into the lungs every 4 (four) hours as needed for wheezing.     alendronate (FOSAMAX) 70 MG tablet Take 70 mg by mouth every Saturday.     Calcium Citrate-Vitamin D 315-250 MG-UNIT TABS Take 2 tablets by mouth daily.     carboxymethylcellulose (REFRESH PLUS) 0.5 % SOLN Place 1 drop into both eyes in the morning and at bedtime.     Cholecalciferol (VITAMIN D3) 10 MCG (400 UNIT) CAPS Take 800 Units by mouth daily.     Clobetasol Prop Emollient Base (CLOBETASOL PROPIONATE E) 0.05 % emollient cream Apply 1 application topically 2 (two) times daily as needed. 60 g 6   cloNIDine (CATAPRES) 0.1 MG tablet TAKE 1 TABLET BY MOUTH AT BEDTIME , MAY TAKE ADDITIONAL 1 TAB IF BLOOD PRESSURE IS HIGH 180 tablet 1   clopidogrel (PLAVIX) 75 MG tablet Take 75 mg by mouth daily.     Coenzyme Q10 (COQ10) 200 MG CAPS Take 200 mg by mouth daily.     cycloSPORINE (RESTASIS) 0.05 % ophthalmic emulsion Place 1 drop into both eyes 2 (two) times daily.       fluticasone (FLONASE) 50 MCG/ACT nasal spray Place 1 spray into both nostrils in the morning and at bedtime.     fluticasone-salmeterol (ADVAIR DISKUS) 100-50 MCG/ACT AEPB 1 puff     Garlic 6759 MG CAPS Take 1,000 mg by mouth daily as needed (high blood pressure).     levETIRAcetam (KEPPRA) 500 MG tablet Take 1 tablet (500 mg total) by mouth every 12 (twelve) hours. 60 tablet 5   LIVALO 2 MG TABS TAKE 1 TABLET BY MOUTH EVERY DAY 90 tablet 1   Magnesium 250 MG TABS Take 250 mg by mouth daily.     mirtazapine (REMERON) 15 MG tablet      Multiple Vitamins-Minerals (CENTRUM SILVER) CHEW See admin instructions.     NIFEdipine (ADALAT CC) 30 MG 24 hr tablet 1 tablet on an empty stomach     NIFEdipine (PROCARDIA-XL/NIFEDICAL-XL) 30 MG 24 hr tablet TAKE 1 TABLET BY MOUTH EVERY DAY 90 tablet 1   ofloxacin (OCUFLOX) 0.3 % ophthalmic solution      omeprazole (PRILOSEC OTC) 20 MG tablet Take 20 mg by mouth daily as needed (for reflux).      polyethylene glycol (MIRALAX / GLYCOLAX) packet Take 17 g by mouth daily as needed for moderate constipation.     pyridOXINE (VITAMIN B-6) 100 MG tablet Take 100 mg by mouth daily.      spironolactone (ALDACTONE) 50  MG tablet Take 50 mg by mouth daily.      thiamine 100 MG tablet Take 100 mg by mouth daily.     triamcinolone cream (KENALOG) 0.1 % Apply 1 application topically 2 (two) times daily as needed (rash/ eczema).   2   No current facility-administered medications on file prior to visit.    Cardiovascular & other pertient studies:  EKG 03/16/2021: Sinus rhythm 57 bpm Left axis deviation Left atrial enlargement  Poor R-wave progression Low voltage  Renal artery duplex  12/14/2020:  No evidence of renal artery occlusive disease in either renal artery.  Normal intrarenal vascular perfusion is noted in both kidneys.  Renal length is within normal limits for both kidneys.  Diffuse plaque observed in the abdominal aorta. Normal abdominal aorta  flow  velocities noted.  Coronary angiogram 04/01/2018: LM: Normal LAD: Prox-mid 40% stenosis. iFR 0.99 (Physiologically nonsignificant) LCx: Dominant. Small branches with mild disease RCA: Nondominant. Normal   LVEDP mildly elevated.   Carotid artery duplex 02/23/2018: No hemodynamically significant arterial disease in the internal carotid artery bilaterally. No significant plaque burden. Antegrade right vertebral artery flow. Antegrade left vertebral artery flow.  Echocardiogram 10/29/2017: Left ventricle cavity is normal in size. Mild concentric hypertrophy of the left ventricle. Normal global wall motion. Normal diastolic filling pattern. Calculated EF 69%. No significant valvular abnormality.  Insignificant pericardial effusion.  Exercise myoview stress 10/20/2017: 1. The resting electrocardiogram demonstrated normal sinus rhythm, normal resting conduction and no resting arrhythmias.  The stress electrocardiogram was normal.  Patient exercised on Bruce protocol for 7:43 minutes and achieved 8.54 METS. Stress test terminated due to mid sternal chest pain and 90% % MPHR achieved (Target HR >85%). Normal BP. 2.  Myocardial perfusion imaging is normal. Overall left ventricular systolic function was normal without regional wall motion abnormalities. The left ventricular ejection fraction was 76%.  This is a low risk study.  Recent labs: 03/18/2021: Glucose 106, BUN/Cr 12/0.86. EGFR >60. Na/K 133/3.9. Rest of the CMP normal H/H 12/36. MCV 87. Platelets 243  11/13/2020: Glucose 144, BUN/Cr 16/0.8. EGFR >60. Na/K 139/4.0. Rest of the CMP normal Chol 180, TG 85, HDL 62, LDL 101  Review of Systems  Cardiovascular:  Negative for chest pain, dyspnea on exertion, leg swelling, palpitations and syncope.  Skin:  Positive for rash (Hives on back, now resolving).  Neurological:  Negative for headaches and paresthesias.  Psychiatric/Behavioral:  The patient is nervous/anxious.    Unchanged compared  to previous visit     Vitals:   07/09/21 0956  BP: 131/73  Pulse: 64  Resp: 17  Temp: 97.8 F (36.6 C)  SpO2: 98%      Body mass index is 20.5 kg/m. Filed Weights   07/09/21 0956  Weight: 127 lb (57.6 kg)     Objective:   Physical Exam Vitals and nursing note reviewed.  Constitutional:      Appearance: She is well-developed.  Neck:     Vascular: No JVD.  Cardiovascular:     Rate and Rhythm: Normal rate and regular rhythm.     Pulses: Intact distal pulses.     Heart sounds: Normal heart sounds. No murmur heard. Pulmonary:     Effort: Pulmonary effort is normal.     Breath sounds: Normal breath sounds. No wheezing or rales.  Musculoskeletal:     Right lower leg: No edema.     Left lower leg: No edema.       Assessment & Recommendations:   81 y/o  African-American female with hypertension, diet controlled diabetes, hyperlipidemia,  GERD and remote history of TIA, mild nonobstructive coronary artery disease (Cath 04/01/2018).  Hypertension: Intolerant to several medications. Blood pressure not very well controlled. No change made to her current antihypertensive therapy. She is concerned about her weight loss and whether it could be related to hydralazine.  My suspicion is very low for hydralazine to be the only cause of her symptoms.  She is going to check with her PCP and request referral to dietitian.  Coronary artery disease involving native coronary artery of native heart without angina pectoris Continue risk factor modification with BP and lipid lowering therapy. EKG shows no ischemia.  Continue aspirin 81 mg daily, pitvastatin. She has not tolerated other statins in the past. She does not want to try injectables.  Paresthesia in legs: Very limited history provided by the patient.  I have encouraged her to pay attention to aggravating and relieving factors.  She could potentially have some neuropathy.  Unlikely to be claudication.    F/u in 6 months    Stephaniemarie Stoffel Esther Hardy, MD [facility] Pager: [phone] Office: [phone]

## 2021-07-09 ENCOUNTER — Telehealth: Payer: Self-pay | Admitting: Family Medicine

## 2021-07-09 ENCOUNTER — Other Ambulatory Visit: Payer: Self-pay

## 2021-07-09 ENCOUNTER — Other Ambulatory Visit: Payer: Self-pay | Admitting: Neurology

## 2021-07-09 ENCOUNTER — Encounter: Payer: Self-pay | Admitting: Neurology

## 2021-07-09 ENCOUNTER — Ambulatory Visit: Payer: Medicare Other | Admitting: Cardiology

## 2021-07-09 ENCOUNTER — Encounter: Payer: Self-pay | Admitting: Cardiology

## 2021-07-09 VITALS — BP 131/73 | HR 64 | Temp 97.8°F | Resp 17 | Ht 66.0 in | Wt 127.0 lb

## 2021-07-09 DIAGNOSIS — I2510 Atherosclerotic heart disease of native coronary artery without angina pectoris: Secondary | ICD-10-CM

## 2021-07-09 DIAGNOSIS — I10 Essential (primary) hypertension: Secondary | ICD-10-CM

## 2021-07-09 DIAGNOSIS — E782 Mixed hyperlipidemia: Secondary | ICD-10-CM | POA: Diagnosis not present

## 2021-07-09 NOTE — Telephone Encounter (Signed)
Called pt back. Asked that she reach out to Dr. Amparo Bristol office for refill. She verbalized understanding and appreciation for call.

## 2021-07-09 NOTE — Telephone Encounter (Signed)
Just wanting to confirm with you first. Did you want pt to get refills from Dr. Delice Lesch?

## 2021-07-09 NOTE — Telephone Encounter (Signed)
Pt is needing a refill request for her levETIRAcetam (KEPPRA) 500 MG tablet [facility] [address]

## 2021-07-10 ENCOUNTER — Other Ambulatory Visit: Payer: Self-pay | Admitting: Neurology

## 2021-07-10 ENCOUNTER — Encounter: Payer: Self-pay | Admitting: Dermatology

## 2021-07-10 MED ORDER — LEVETIRACETAM 500 MG PO TABS: 500.0000 mg | ORAL_TABLET | Freq: Two times a day (BID) | ORAL | 5 refills | Status: DC

## 2021-07-10 NOTE — Progress Notes (Signed)
° °  Follow-Up Visit   Subjective  Sara Brown is a 81 y.o. female who presents for the following: Follow-up (Follow up on dermatitis on back and sides. Patient says it is healing just wants a refill on the medication.).  Chronic rash with itching mainly on torso Location:  Duration:  Quality:  Associated Signs/Symptoms: Modifying Factors:  Severity:  Timing: Context:   Objective  Well appearing patient in no apparent distress; mood and affect are within normal limits. Left Lower Back, Right Lower Back No visible postinflammatory hyperpigmentation without active dermatitis.  I discussed with the patient the proper use of her topical clobetasol would be for areas with visible signs of inflammation like swelling or redness and/or itching.  She was told that the pigmentation may persist.    All skin waist up examined.  Areas beneath undergarments not fully examined.   Assessment & Plan    Dermatitis Left Lower Back; Right Lower Back  Okay refills on clobetasol.  Avoid use on face and body folds.  Follow-up can be on a as needed basis.  Clobetasol Prop Emollient Base (CLOBETASOL PROPIONATE E) 0.05 % emollient cream - Left Lower Back, Right Lower Back Apply 1 application topically 2 (two) times daily as needed.      I, Lavonna Monarch, MD, have reviewed all documentation for this visit.  The documentation on 07/10/21 for the exam, diagnosis, procedures, and orders are all accurate and complete.

## 2021-07-27 DIAGNOSIS — U071 COVID-19: Secondary | ICD-10-CM | POA: Diagnosis not present

## 2021-08-09 ENCOUNTER — Telehealth: Payer: Self-pay

## 2021-08-09 NOTE — Telephone Encounter (Signed)
Shoes Ordered - Apex X801W 10.87M ?

## 2021-08-10 ENCOUNTER — Telehealth: Payer: Self-pay

## 2021-08-10 NOTE — Telephone Encounter (Signed)
Casts sent to [employer] ?

## 2021-08-16 ENCOUNTER — Telehealth: Payer: Self-pay

## 2021-08-16 NOTE — Telephone Encounter (Signed)
error 

## 2021-08-22 ENCOUNTER — Telehealth: Payer: Self-pay | Admitting: Cardiology

## 2021-08-22 NOTE — Telephone Encounter (Signed)
Patient requesting to speak with you. She tried to call Friday 3/10 but couldn't get through since the phone lines were down. Please call her back at [phone]. ?

## 2021-08-22 NOTE — Telephone Encounter (Signed)
Called pt no answer, left a vm

## 2021-08-24 NOTE — Telephone Encounter (Signed)
Called pt, and pt mention that her medication Livalo, pt mention that her RN that went to her house told her to see if there are any cheaper medication instead of Livalo.

## 2021-08-31 ENCOUNTER — Encounter: Payer: Self-pay | Admitting: Psychology

## 2021-08-31 DIAGNOSIS — K589 Irritable bowel syndrome without diarrhea: Secondary | ICD-10-CM | POA: Insufficient documentation

## 2021-08-31 DIAGNOSIS — J4521 Mild intermittent asthma with (acute) exacerbation: Secondary | ICD-10-CM | POA: Insufficient documentation

## 2021-08-31 DIAGNOSIS — I700 Atherosclerosis of aorta: Secondary | ICD-10-CM | POA: Insufficient documentation

## 2021-08-31 DIAGNOSIS — E559 Vitamin D deficiency, unspecified: Secondary | ICD-10-CM | POA: Insufficient documentation

## 2021-08-31 DIAGNOSIS — Z860100 Personal history of colon polyps, unspecified: Secondary | ICD-10-CM | POA: Insufficient documentation

## 2021-08-31 DIAGNOSIS — R9401 Abnormal electroencephalogram [EEG]: Secondary | ICD-10-CM | POA: Insufficient documentation

## 2021-08-31 DIAGNOSIS — H35039 Hypertensive retinopathy, unspecified eye: Secondary | ICD-10-CM | POA: Insufficient documentation

## 2021-08-31 DIAGNOSIS — F411 Generalized anxiety disorder: Secondary | ICD-10-CM | POA: Insufficient documentation

## 2021-09-03 ENCOUNTER — Ambulatory Visit (INDEPENDENT_AMBULATORY_CARE_PROVIDER_SITE_OTHER): Payer: Medicare Other | Admitting: Psychology

## 2021-09-03 ENCOUNTER — Ambulatory Visit: Payer: Medicare Other | Admitting: Psychology

## 2021-09-03 ENCOUNTER — Other Ambulatory Visit: Payer: Self-pay

## 2021-09-03 ENCOUNTER — Encounter: Payer: Self-pay | Admitting: Psychology

## 2021-09-03 DIAGNOSIS — I6781 Acute cerebrovascular insufficiency: Secondary | ICD-10-CM

## 2021-09-03 DIAGNOSIS — R4189 Other symptoms and signs involving cognitive functions and awareness: Secondary | ICD-10-CM | POA: Diagnosis not present

## 2021-09-03 DIAGNOSIS — G459 Transient cerebral ischemic attack, unspecified: Secondary | ICD-10-CM

## 2021-09-03 NOTE — Progress Notes (Signed)
? ?NEUROPSYCHOLOGICAL EVALUATION ?[place]. [facility] ?[place] Department of Neurology ? ?Date of Evaluation: September 03, 2021 ? ?Reason for Referral:  ? ?Sara Brown is a 81 y.o. right-handed African-American female referred by Ellouise Newer, M.D., to characterize her current cognitive functioning and assist with diagnostic clarity and treatment planning in the context of subjective cognitive decline.  ? ?Assessment and Plan:  ? ?Clinical Impression(s): ?Sara Brown's pattern of performance is suggestive of an isolated impairment across a line orientation task. However, performances across other visuospatial/constructional tasks were consistently appropriate. Despite appropriate normative performances across measures, Sara Brown's retention scores across list and figure tasks were 0% and 11% respectively, suggesting an additional weakness in retrieval aspects of memory (she retained 80% of information across a story-based task). She performed well across recognition trials across all memory tasks, suggesting intact memory encoding and storage abilities. Performances were also appropriate across processing speed, attention/concentration, executive functioning, and receptive and expressive language. Sara Brown denied difficulties completing instrumental activities of daily living (ADLs) independently. Her husband alluded to some driving concerns but did not provide additional detail. At the present time, I do not believe that current test scores arise to where a formal cognitive disorder diagnosis is warranted.  ? ?The etiology for weaknesses described above is unclear. Anecdotal evidence would suggest that individuals wearing corrective lenses (as Sara Brown was) can have greater difficulty across line orientation tasks and can create artifical impairments. Variability across retrieval aspects of memory are commonly seen with cerebrovascular dysfunction. Sara Brown has numerous ailments in  her medical history which could create said dysfunction (e.g., hypertension, coronary artery disease, aortic atherosclerosis, hyperlipidemia, hyperglycemia, type II diabetes) as well as part neuroimaging which has suggested mild microvascular ischemic changes. These deficits could also be influenced by ongoing seizure activity or other electrical abnormalities depending on their prevalence and frequency. However, it should be noted that testing did not suggest greater left temporal dysfunction consistent with prior EEG recordings as scores were not lateralizing or localizing. Psychiatric distress, various stressors, chronic pain, and sleep disturbances could also create inefficiencies.  ? ?Despite variability across memory tasks, Sara Brown performed well across recognition trials, which does not suggest a memory storage deficit. Overall, memory performance combined with intact performances across other areas of cognitive functioning is not suggestive of Alzheimer's disease. Likewise, her cognitive and behavioral profile is not suggestive of any other form of neurodegenerative illness presently. ? ?Recommendations: ?A repeat neuropsychological evaluation in 18 months (or sooner if functional decline is noted) is recommended to assess the trajectory of future cognitive decline should it occur. This will also aid in future efforts towards improved diagnostic clarity. ? ?Should there be a progression of current deficits over time, she is unlikely to regain any independent living skills lost. Therefore, it is recommended that she remain as involved as possible in all aspects of household chores, finances, and medication management, with supervision to ensure adequate performance. She will likely benefit from the establishment and maintenance of a routine in order to maximize her functional abilities over time. ? ?It will be important for Sara Brown to have another person with her when in situations where she may need  to process information, weigh the pros and cons of different options, and make decisions, in order to ensure that she fully understands and recalls all information to be considered. ? ?Performance across neurocognitive testing is not a strong predictor of an individual's safety operating a motor vehicle. Should her family wish to pursue  a formalized driving evaluation, they could reach out to the following agencies: ?[employer] in [place]: [phone] ?[facility]: [phone] ?[facility]: [phone] ?[facility]: [phone] or [phone] ? ?Sara Brown is encouraged to attend to lifestyle factors for brain health (e.g., regular physical exercise, good nutrition habits, regular participation in cognitively-stimulating activities, and general stress management techniques), which are likely to have benefits for both emotional adjustment and cognition. Optimal control of vascular risk factors (including safe cardiovascular exercise and adherence to dietary recommendations) is encouraged. Continued participation in activities which provide mental stimulation and social interaction is also recommended.  ? ?Memory can be improved using internal strategies such as rehearsal, repetition, chunking, mnemonics, association, and imagery. External strategies such as written notes in a consistently used memory journal, visual and nonverbal auditory cues such as a calendar on the refrigerator or appointments with alarm, such as on a cell phone, can also help maximize recall.   ? ?Because she shows better recall for structured information, she will likely understand and retain new information better if it is presented to her in a meaningful or well-organized manner at the outset, such as grouping items into meaningful categories or presenting information in an outlined, bulleted, or story format.  ? ?To address problems with fluctuating attention, she may wish to  consider: ?  -Avoiding external distractions when needing to concentrate ?  -Limiting exposure to fast paced environments with multiple sensory demands ?  -Writing down complicated information and using checklists ?  -Attempting and completing one task at a time (i.e., no multi-tasking) ?  -Verbalizing aloud each step of a task to maintain focus ?  -Reducing the amount of information considered at one time ? ?Reducing anxiety may also aid in the retrieval of information. She is encouraged to prepare scripts she can use socially when she experiences difficulty with word finding or memory. Such scripts should be brief explanations of the difficulty (e.g., "the word escapes me now") and allow her to move the conversation forward quickly rather than dwelling on the issue. ? ?Review of Records:  ? ?Sara Brown was seen by [facility] Ellouise Newer, M.D.) on 03/01/2021 for a second opinion regarding ongoing seizure activity. She had been seeing neurologist Dr. Rexene Alberts since 2016 for a several year history of intermittent abnormal sensations in her right face, right arm, and pulling sensation in right arm and shoulders. Work-up in 2016 showed a normal EEG and unremarkable brain MRI. She was in the ER in 02/2018 for facial paresthesias and discomfort, as well as reported symptoms involving her left arm, left side of her face, and tongue. Head CT showed no acute changes. She had a repeat brain MRI on 06/18/2018 which revealed mild chronic microvascular disease but no acute abnormalities. She was in the ER in 08/2020 due to elevated blood pressure. She also reported symptoms on the left side of her face, lip, and tongue, as well as an abnormal sensation in her left leg like something was moving, making her feel as though she could not walk. Brain MRI and a head/neck MRA on 09/05/2020 did not show any acute changes or significant stenosis. EEG in 08/2020 reported focal slowing over the left temporal region. She was discharged  home on Levetiracetam '500mg'$  BID. She was back in the ER on 11/13/2020 for sudden-onset right-sided numbness involving the forehead, face, lip, arm and leg. She also reported clumsiness and heaviness in the right le

## 2021-09-03 NOTE — Progress Notes (Signed)
? ?  Psychometrician Note ?  ?Cognitive testing was administered to Sara Brown by Milana Kidney, B.S. (psychometrist) under the supervision of Dr. Christia Reading, Ph.D., licensed psychologist on 09/03/2021. Sara Brown did not appear overtly distressed by the testing session per behavioral observation or responses across self-report questionnaires. Rest breaks were offered.  ?  ?The battery of tests administered was selected by Dr. Christia Reading, Ph.D. with consideration to Sara Brown's current level of functioning, the nature of her symptoms, emotional and behavioral responses during interview, level of literacy, observed level of motivation/effort, and the nature of the referral question. This battery was communicated to the psychometrist. Communication between Dr. Christia Reading, Ph.D. and the psychometrist was ongoing throughout the evaluation and Dr. Christia Reading, Ph.D. was immediately accessible at all times. Dr. Christia Reading, Ph.D. provided supervision to the psychometrist on the date of this service to the extent necessary to assure the quality of all services provided.  ?  ?Sara Brown will return within approximately 1-2 weeks for an interactive feedback session with Dr. Melvyn Novas at which time her test performances, clinical impressions, and treatment recommendations will be reviewed in detail. Sara Brown understands she can contact our office should she require our assistance before this time. ? ?A total of 165 minutes of billable time were spent face-to-face with Sara Brown by the psychometrist. This includes both test administration and scoring time. Billing for these services is reflected in the clinical report generated by Dr. Christia Reading, Ph.D. ? ?This note reflects time spent with the psychometrician and does not include test scores or any clinical interpretations made by Dr. Melvyn Novas. The full report will follow in a separate note.  ?

## 2021-09-04 DIAGNOSIS — Z20822 Contact with and (suspected) exposure to covid-19: Secondary | ICD-10-CM | POA: Diagnosis not present

## 2021-09-05 DIAGNOSIS — Z20822 Contact with and (suspected) exposure to covid-19: Secondary | ICD-10-CM | POA: Diagnosis not present

## 2021-09-06 ENCOUNTER — Ambulatory Visit: Payer: Medicare Other

## 2021-09-10 ENCOUNTER — Ambulatory Visit (INDEPENDENT_AMBULATORY_CARE_PROVIDER_SITE_OTHER): Payer: Medicare Other | Admitting: Psychology

## 2021-09-10 ENCOUNTER — Telehealth: Payer: Self-pay | Admitting: Neurology

## 2021-09-10 DIAGNOSIS — R4189 Other symptoms and signs involving cognitive functions and awareness: Secondary | ICD-10-CM | POA: Diagnosis not present

## 2021-09-10 DIAGNOSIS — I6781 Acute cerebrovascular insufficiency: Secondary | ICD-10-CM | POA: Diagnosis not present

## 2021-09-10 DIAGNOSIS — G459 Transient cerebral ischemic attack, unspecified: Secondary | ICD-10-CM

## 2021-09-10 NOTE — Progress Notes (Signed)
? ?  Neuropsychology Feedback Session ?[place]. [facility] ?[place] Department of Neurology ? ?Reason for Referral:  ? ?Sara Brown is a 81 y.o. right-handed African-American female referred by Ellouise Newer, M.D., to characterize her current cognitive functioning and assist with diagnostic clarity and treatment planning in the context of subjective cognitive decline.  ? ?Feedback:  ? ?[name] completed a comprehensive neuropsychological evaluation on 09/03/2021. Please refer to that encounter for the full report and recommendations. Briefly, results suggested an isolated impairment across a line orientation task. However, performances across other visuospatial/constructional tasks were consistently appropriate. Despite appropriate normative performances across measures, [name]'s retention scores across list and figure tasks were 0% and 11% respectively, suggesting an additional weakness in retrieval aspects of memory (she retained 80% of information across a story-based task). She performed well across recognition trials across all memory tasks, suggesting intact memory encoding and storage abilities. The etiology for weaknesses described above is unclear. Anecdotal evidence would suggest that individuals wearing corrective lenses can have greater difficulty across line orientation tasks and can create artifical impairments. Variability across retrieval aspects of memory are commonly seen with cerebrovascular dysfunction. [name] has numerous ailments in her medical history which could create said dysfunction (e.g., hypertension, coronary artery disease, aortic atherosclerosis, hyperlipidemia, hyperglycemia, type II diabetes) as well as part neuroimaging which has suggested mild microvascular ischemic changes. These deficits could also be influenced by ongoing seizure activity or other electrical abnormalities depending on their prevalence and frequency. However, it should be noted  that testing did not suggest greater left temporal dysfunction consistent with prior EEG recordings as scores were not lateralizing or localizing. Psychiatric distress, various stressors, chronic pain, and sleep disturbances could also create inefficiencies.  ? ?[name] was accompanied by her husband during the current feedback session. Content of the current session focused on the results of her neuropsychological evaluation. [name] was given the opportunity to ask questions and her questions were answered. She was encouraged to reach out should additional questions arise. A copy of her report was provided at the conclusion of the visit.  ? ?  ? ?20 minutes were spent conducting the current feedback session with [name], billed as one unit [phone].  ?

## 2021-09-10 NOTE — Telephone Encounter (Signed)
Pls let her know we will put her on cancellation list to f/u with me to discuss Keppra and results of memory testing. Pls put on waitlist, thanks ?

## 2021-09-10 NOTE — Telephone Encounter (Signed)
Patient saw Dr Melvyn Novas today.  She has some questions about the medication Keppra that she is taking. ?

## 2021-09-11 ENCOUNTER — Ambulatory Visit: Payer: Medicare Other

## 2021-09-11 DIAGNOSIS — L84 Corns and callosities: Secondary | ICD-10-CM

## 2021-09-11 DIAGNOSIS — M2041 Other hammer toe(s) (acquired), right foot: Secondary | ICD-10-CM

## 2021-09-11 DIAGNOSIS — E1142 Type 2 diabetes mellitus with diabetic polyneuropathy: Secondary | ICD-10-CM

## 2021-09-11 NOTE — Progress Notes (Signed)
Attempted to deliver shoes and orthotics. Patient refused shoes stating "Those look like men's shoes." Offered alternative styles and patient selected [employer] [phone]. Patient is to be called to reschedule when new shoes are available. ?

## 2021-09-21 ENCOUNTER — Ambulatory Visit (INDEPENDENT_AMBULATORY_CARE_PROVIDER_SITE_OTHER): Payer: Medicare Other | Admitting: Podiatry

## 2021-09-21 ENCOUNTER — Ambulatory Visit (INDEPENDENT_AMBULATORY_CARE_PROVIDER_SITE_OTHER): Payer: Medicare Other

## 2021-09-21 DIAGNOSIS — M2042 Other hammer toe(s) (acquired), left foot: Secondary | ICD-10-CM

## 2021-09-21 DIAGNOSIS — E1142 Type 2 diabetes mellitus with diabetic polyneuropathy: Secondary | ICD-10-CM

## 2021-09-21 DIAGNOSIS — M79675 Pain in left toe(s): Secondary | ICD-10-CM | POA: Diagnosis not present

## 2021-09-21 DIAGNOSIS — L84 Corns and callosities: Secondary | ICD-10-CM

## 2021-09-21 DIAGNOSIS — M79674 Pain in right toe(s): Secondary | ICD-10-CM | POA: Diagnosis not present

## 2021-09-21 DIAGNOSIS — M2041 Other hammer toe(s) (acquired), right foot: Secondary | ICD-10-CM | POA: Diagnosis not present

## 2021-09-21 DIAGNOSIS — B351 Tinea unguium: Secondary | ICD-10-CM

## 2021-09-21 NOTE — Progress Notes (Signed)
SITUATION ?Reason for Visit: Fitting of Diabetic [place] ?Patient / Caregiver Report:  Patient is satisfied with fit and function of shoes and insoles. ? ?OBJECTIVE DATA: ?Patient History / Diagnosis:   ?  ICD-10-CM   ?1. Diabetic peripheral neuropathy associated with type 2 diabetes mellitus ([place])  E11.42   ?  ?2. Hammer toes of both feet  M20.41   ? M20.42   ?  ?3. Callus  L84   ?  ? ? ?Change in Status:   None ? ?ACTIONS PERFORMED: ?In-Person Delivery, patient was fit with: ?- 1x pair [id number] PDAC approved prefabricated Diabetic Shoes: [employer] Milinda Hirschfeld 1M ?- 3x pair [phone] PDAC approved vacuum formed custom diabetic insoles; [employer]: [id number] ? ?Shoes and insoles were verified for structural integrity and safety. Patient wore shoes and insoles in office. Skin was inspected and free of areas of concern after wearing shoes and inserts. Shoes and inserts fit properly. Patient / Caregiver provided with ferbal instruction and demonstration regarding donning, doffing, wear, care, proper fit, function, purpose, cleaning, and use of shoes and insoles ' and in all related precautions and risks and benefits regarding shoes and insoles. Patient / Caregiver was instructed to wear properly fitting socks with shoes at all times. Patient was also provided with verbal instruction regarding how to report any failures or malfunctions of shoes or inserts, and necessary follow up care. Patient / Caregiver was also instructed to contact physician regarding change in status that may affect function of shoes and inserts.  ? ?Patient / Caregiver verbalized undersatnding of instruction provided. Patient / Caregiver demonstrated independence with proper donning and doffing of shoes and inserts. ? ?PLAN ?Patient to follow with treating physician as recommended. Plan of care was discussed with and agreed upon by patient and/or caregiver. All questions were answered and concerns addressed. ? ?

## 2021-09-26 DIAGNOSIS — E7800 Pure hypercholesterolemia, unspecified: Secondary | ICD-10-CM | POA: Diagnosis not present

## 2021-09-26 DIAGNOSIS — I10 Essential (primary) hypertension: Secondary | ICD-10-CM | POA: Diagnosis not present

## 2021-09-26 DIAGNOSIS — I700 Atherosclerosis of aorta: Secondary | ICD-10-CM | POA: Diagnosis not present

## 2021-09-26 DIAGNOSIS — F418 Other specified anxiety disorders: Secondary | ICD-10-CM | POA: Diagnosis not present

## 2021-09-26 DIAGNOSIS — M810 Age-related osteoporosis without current pathological fracture: Secondary | ICD-10-CM | POA: Diagnosis not present

## 2021-09-26 DIAGNOSIS — E1169 Type 2 diabetes mellitus with other specified complication: Secondary | ICD-10-CM | POA: Diagnosis not present

## 2021-09-26 DIAGNOSIS — Z1389 Encounter for screening for other disorder: Secondary | ICD-10-CM | POA: Diagnosis not present

## 2021-09-26 DIAGNOSIS — Z0000 Encounter for general adult medical examination without abnormal findings: Secondary | ICD-10-CM | POA: Diagnosis not present

## 2021-09-27 DIAGNOSIS — Z20822 Contact with and (suspected) exposure to covid-19: Secondary | ICD-10-CM | POA: Diagnosis not present

## 2021-09-30 ENCOUNTER — Encounter: Payer: Self-pay | Admitting: Podiatry

## 2021-09-30 NOTE — Progress Notes (Signed)
?  Subjective:  ?Patient ID: Sara Brown, female    DOB: [birth year],  MRN: [id number] ? ?Sara Brown [facility] presents to clinic today for at risk foot care with history of diabetic neuropathy and callus(es) b/l lower extremities and painful thick toenails that are difficult to trim. Painful toenails interfere with ambulation. Aggravating factors include wearing enclosed shoe gear. Pain is relieved with periodic professional debridement. Painful calluses are aggravated when weightbearing with and without shoegear. Pain is relieved with periodic professional debridement. ? ?New problem(s): None.  ? ?PCP is Seward Carol, MD , and last visit was August 01, 2021. ? ?Allergies  ?Allergen Reactions  ? Amlodipine Besylate   ?  Other reaction(s): rash and hives  ? Losartan Potassium   ?  Other reaction(s): rash and HIves  ? Naproxen Other (See Comments)  ?  Patient preference  ?Other reaction(s): upset stomach  ? Prednisone Other (See Comments)  ?  Patient Preference  ?Other reaction(s): upset stomach  ? Amlodipine Hives  ? Losartan Rash  ? ? ?Review of Systems: Negative except as noted in the HPI. ? ?Objective: No changes noted in today's physical examination. ? ?Sara Brown is a pleasant 81 y.o. female in NAD. AAO X 3. ? ?Vascular Examination: ?CFT immediate b/l LE. Palpable DP/PT pulses b/l LE. Digital hair absent b/l. Skin temperature gradient WNL b/l. No pain with calf compression b/l. No edema noted b/l. No cyanosis or clubbing noted b/l LE. ? ?Dermatological Examination: ?Pedal integument with normal turgor, texture and tone b/l LE. No open wounds b/l. No interdigital macerations b/l. Toenails 1-5 b/l elongated, thickened, discolored with subungual debris. +Tenderness with dorsal palpation of nailplates. Hyperkeratotic lesion(s) noted submet head 2 left foot. ? ?Musculoskeletal Examination: ?Normal muscle strength 5/5 to all lower extremity muscle groups bilaterally. Hammertoe deformity  noted 2-5 b/l.Marland Kitchen No pain, crepitus or joint limitation noted with ROM b/l LE.  Patient ambulates independently without assistive aids. ? ?Neurological Examination: ?Protective sensation decreased with 10 gram monofilament b/l. Vibratory sensation intact b/l. Proprioception intact bilaterally. ? ? ?  Latest Ref Rng & Units 11/14/2020  ?  5:01 AM  ?Hemoglobin A1C  ?Hemoglobin-A1c 4.8 - 5.6 % 5.9    ? ?Assessment/Plan: ?1. Pain due to onychomycosis of toenails of both feet   ?2. Callus   ?3. Diabetic peripheral neuropathy associated with type 2 diabetes mellitus ([place])   ?  ?-Patient was evaluated and treated. All patient's and/or POA's questions/concerns answered on today's visit. ?-Toenails 1-5 b/l were debrided in length and girth with sterile nail nippers and dremel without iatrogenic bleeding.  ?-Callus(es) submet head 2 left foot pared utilizing sterile scalpel blade without complication or incident. Total number debrided =1. ?-Patient/POA to call should there be question/concern in the interim.  ? ?Return in about 3 months (around 12/21/2021). ? ?Marzetta Board, DPM  ?

## 2021-10-01 ENCOUNTER — Other Ambulatory Visit: Payer: Self-pay | Admitting: Internal Medicine

## 2021-10-01 DIAGNOSIS — R5381 Other malaise: Secondary | ICD-10-CM

## 2021-10-02 ENCOUNTER — Other Ambulatory Visit: Payer: Self-pay | Admitting: Internal Medicine

## 2021-10-02 DIAGNOSIS — E2839 Other primary ovarian failure: Secondary | ICD-10-CM

## 2021-10-06 DIAGNOSIS — Z20822 Contact with and (suspected) exposure to covid-19: Secondary | ICD-10-CM | POA: Diagnosis not present

## 2021-10-10 ENCOUNTER — Other Ambulatory Visit: Payer: Self-pay | Admitting: Cardiology

## 2021-10-24 ENCOUNTER — Ambulatory Visit (INDEPENDENT_AMBULATORY_CARE_PROVIDER_SITE_OTHER): Payer: Medicare Other | Admitting: Neurology

## 2021-10-24 ENCOUNTER — Encounter: Payer: Self-pay | Admitting: Neurology

## 2021-10-24 VITALS — BP 137/75 | HR 88 | Ht 66.0 in | Wt 123.0 lb

## 2021-10-24 DIAGNOSIS — R413 Other amnesia: Secondary | ICD-10-CM | POA: Diagnosis not present

## 2021-10-24 DIAGNOSIS — R42 Dizziness and giddiness: Secondary | ICD-10-CM | POA: Diagnosis not present

## 2021-10-24 DIAGNOSIS — R29818 Other symptoms and signs involving the nervous system: Secondary | ICD-10-CM | POA: Diagnosis not present

## 2021-10-24 DIAGNOSIS — H6123 Impacted cerumen, bilateral: Secondary | ICD-10-CM | POA: Diagnosis not present

## 2021-10-24 DIAGNOSIS — I2510 Atherosclerotic heart disease of native coronary artery without angina pectoris: Secondary | ICD-10-CM

## 2021-10-24 DIAGNOSIS — Z9622 Myringotomy tube(s) status: Secondary | ICD-10-CM | POA: Diagnosis not present

## 2021-10-24 NOTE — Patient Instructions (Signed)
Good to see you doing well. ? ? ?Start weaning off the Levetiracetam (Keppra): take 1 tablet every night for 1 week, then reduce to 1 tablet every other night for 1 week, then stop. ? ? ?2. Follow-up in 4-5 months, call for any change in symptoms ?

## 2021-10-24 NOTE — Progress Notes (Signed)
? ?NEUROLOGY FOLLOW UP OFFICE NOTE ? ?Sara Brown [facility] ?[id number] ?28-Feb-1941 ? ?HISTORY OF PRESENT ILLNESS: ?I had the pleasure of seeing Sara Brown in follow-up in the neurology clinic on 10/24/2021.  The patient was last seen 8 months ago for second opinion regarding seizures. She is accompanied by her husband who helps supplement the history today. Records and images were personally reviewed where available.  Her 24-hour EEG showed occasional left temporal slowing, no epileptiform discharges. Episodes of chest sensations and heavy breathing did not show any EEG correlate. 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 There was an isolated impairment across a line orientation task, which may be due to vision changes. Vascular issues may also be contributing. Testing did not lateralize to the left hemisphere. Psychiatric distress, various stressors, chronic pain, and sleep disturbances could also create inefficiencies.  ? ?Since her last visit, she denies any focal numbness/tingling/weakness. She has occasional sensations on the vertex of the head. She has right shoulder pain and some neck pain. She and her husband deny any staring/unresponsive episodes, she denies any loss of time, myoclonic jerks. She is concerned about her BP today, it is 137/75, which she states is unusual for her. She has also been feeling a little woozy and plans to call her cardiologist. She has been losing weight and plans to see a dietician.  ? ? ? ?History on Initial Assessment 03/01/2021: This is an 81 year old right-handed woman with a history of hypertension, hyperlipidemia, diabetes, neuropathy, left carotid stenosis, presenting for second opinion regarding seizures. She had been seeing neurologist Dr. Rexene Alberts since 2016 for several year history of intermittent abnormal sensations in her right face, right arm, pulling sensation in right arm and shoulders. Work-up  in 2016 showed a normal EEG and unremarkable brain MRI. She was in the ER in 02/2018 for facial paresthesias and discomfort, at that time she reported symptoms on the left arm and face, as well as tongue. Head CT no acute changes. He had a repeat brain MRI in 1/202 with no acute changes, mild chronic microvascular disease. She was in the ER in 08/2020 due to elevated BP, she reported symptoms on the left side of her face, lip, and tongue. She also felt an abnormal sensation in her left leg like something was moving, making her feel as though she could not walk, bu tin the ER, she reported it may have occurred on the right leg but not as pronounced on the left. MRI brain and MRA head and neck in 08/2020 did not show any acute changes or significant stenosis. EEG in 08/2020 reported focal slowing over the left temporal region. She was discharged home on Levetiracetam '500mg'$  BID. She was back in the ER on 11/13/20 for sudden-onset right-sided numbness involving the forehead, face, lip, arm and leg. She also reported clumsiness and heaviness in the right leg. Another brain MRI done did not show any acute changes, MRA no significant stenosis. EEG again reported left temporal slowing. Levetiracetam was continued and she was advised to take DAPT with aspirin and Plavix for 3 weeks, the Plavix thereafter.  ? ?She also notes that when her BP goes too high or low, she gets symptoms on the side of her face. That day in June, she felt it on the right side, with tightening or heaviness in her tongue and lip, no physical changes visible to her husband. Her husband denies any staring/unresponsive episodes. She states her tongue is  heavy right now because her BP is low. She had also been reporting headaches to Dr. Rexene Alberts. She states the pain on the top her her head has not been recent, she cannot recall the last time she had a headache. She states it is not a headache but a stabbing kind of pain. She feels dizzy when her BP is off. She is  asking about her memory, she reports being tested and score was 23/30. She is usually pretty good with remembering her medications. She uses the computer frequently and denies forgetting passwords. She has not been driving, but was not getting lost when she drove. She "probably does not sleep enough."  Her brother had epilepsy. Otherwise she had a normal birth and early development.  There is no history of febrile convulsions, CNS infections such as meningitis/encephalitis, significant traumatic brain injury, neurosurgical procedures. ? ?  ?Lab Results  ?Component Value Date  ? TSH 3.258 09/05/2020  ? ?No results found for: VITAMINB12 ? ?Lab Results  ?Component Value Date  ? HGBA1C 5.9 (H) 11/14/2020  ? ? ?PAST MEDICAL HISTORY: ?Past Medical History:  ?Diagnosis Date  ? Age-related osteoporosis without current pathological fracture 08/18/2020  ? Allergic rhinitis   ? Angina at rest 02/09/2018  ? Arthritis   ? "fingers, knees" (02/09/2018)  ? Asthma   ? "stopped taking RX after dr said I don't have this" (02/09/2018)  ? Basal cell carcinoma   ? Bradycardia 02/09/2018  ? Carotid stenosis   ? ICA(L)  ? Chest pain due to coronary artery disease 02/09/2018  ? Chronic constipation 08/18/2020  ? Complication of anesthesia   ? "felt the cut w/one of my c-sections" (02/09/2018)  ? Coronary artery disease involving native coronary artery of native heart without angina pectoris 09/17/2018  ? Diabetes mellitus type 2, controlled 03/31/2018  ? EEG abnormal   ? Essential hypertension 03/31/2018  ? Generalized anxiety disorder   ? GERD (gastroesophageal reflux disease)   ? Hardening of the aorta (main artery of the heart)   ? Headache   ? "years ago; before menopause" (02/09/2018)  ? Hirsutism 06/12/2017  ? Hyperglycemia due to type 2 diabetes mellitus 03/09/2021  ? Hyperlipidemia   ? Hyperpigmentation 02/13/2017  ? Hypertensive retinopathy   ? Irritable bowel syndrome   ? 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  ? Mixed conductive  and sensorineural hearing loss of left ear with restricted hearing of right ear 01/24/2021  ? Mixed hyperlipidemia 12/29/2020  ? Myringotomy tube status 04/03/2021  ? Neck mass 01/17/2017  ? Neuropathic pain 03/09/2021  ? OA (osteoarthritis)   ? Osteoporosis   ? Palpitations 03/16/2021  ? Paresthesias 09/05/2020  ? Personal history of colonic polyps   ? 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 Pure hypercholesterolemia 08/18/2020  ? Shortness of breath 07/30/2007  ? Skin laxity 06/12/2017  ? TIA (transient ischemic attack) ?2005  ? Vitamin D deficiency   ? ? ?MEDICATIONS: ?Current Outpatient Medications on File Prior to Visit  ?Medication Sig Dispense Refill  ? ADVAIR DISKUS 100-50 MCG/DOSE AEPB Inhale 1 puff into the lungs 2 (two) times daily.    ? albuterol (PROVENTIL HFA;VENTOLIN HFA) 108 (90 Base) MCG/ACT inhaler Inhale 1 puff into the lungs every 4 (four) hours as needed for wheezing.    ? alendronate (FOSAMAX) 70 MG tablet Take 70 mg by mouth every Saturday.    ? aspirin 81 MG EC tablet aspirin 81 mg tablet,delayed release ? TAKE 1 TABLET (81 MG TOTAL) BY MOUTH  DAILY. SWALLOW WHOLE.    ? Calcium Citrate-Vitamin D 315-250 MG-UNIT TABS Take 2 tablets by mouth daily.    ? carboxymethylcellulose (REFRESH PLUS) 0.5 % SOLN Place 1 drop into both eyes in the morning and at bedtime.    ? Cholecalciferol (VITAMIN D3) 10 MCG (400 UNIT) CAPS Take 800 Units by mouth daily.    ? clobetasol cream (TEMOVATE) 0.05 % clobetasol 0.05 % topical cream ? APPLY TO AFFECTED AREA DAILY    ? Clobetasol Prop Emollient Base (CLOBETASOL PROPIONATE E) 0.05 % emollient cream Apply 1 application topically 2 (two) times daily as needed. 60 g 6  ? cloNIDine (CATAPRES) 0.1 MG tablet TAKE 1 TABLET BY MOUTH AT BEDTIME , MAY TAKE ADDITIONAL 1 TAB IF BLOOD PRESSURE IS HIGH 180 tablet 1  ? clopidogrel (PLAVIX) 75 MG tablet Take 75 mg by mouth daily.    ? Coenzyme Q10 (COQ10) 200 MG CAPS Take 200 mg by mouth daily.    ? cycloSPORINE (RESTASIS) 0.05 %  ophthalmic emulsion Place 1 drop into both eyes 2 (two) times daily.     ? fluticasone (FLONASE) 50 MCG/ACT nasal spray Place 1 spray into both nostrils in the morning and at bedtime.    ? fluticasone-s

## 2021-11-07 DIAGNOSIS — I10 Essential (primary) hypertension: Secondary | ICD-10-CM | POA: Diagnosis not present

## 2021-11-07 DIAGNOSIS — E1165 Type 2 diabetes mellitus with hyperglycemia: Secondary | ICD-10-CM | POA: Diagnosis not present

## 2021-11-19 ENCOUNTER — Other Ambulatory Visit: Payer: Self-pay | Admitting: Internal Medicine

## 2021-11-19 ENCOUNTER — Telehealth: Payer: Self-pay

## 2021-11-19 ENCOUNTER — Ambulatory Visit
Admission: RE | Admit: 2021-11-19 | Discharge: 2021-11-19 | Disposition: A | Discharge status: Home / Self Care | Payer: Medicare Other | Source: Ambulatory Visit | Attending: Internal Medicine | Admitting: Internal Medicine

## 2021-11-19 DIAGNOSIS — M461 Sacroiliitis, not elsewhere classified: Secondary | ICD-10-CM

## 2021-11-19 DIAGNOSIS — M533 Sacrococcygeal disorders, not elsewhere classified: Secondary | ICD-10-CM | POA: Diagnosis not present

## 2021-11-19 NOTE — Telephone Encounter (Signed)
Pain is likely increasing blood pressure. If pain not controled or blood pressure remains elevated after pain control, can adjust the dose.  How much hydralazine is she taking?  Thanks MJP

## 2021-11-19 NOTE — Telephone Encounter (Signed)
Called pt, no answer. Left vm requesting call back?

## 2021-11-19 NOTE — Telephone Encounter (Signed)
Pt called and stated that her BP has been elevated early in the morning for the last 5 days. Her BP has been running around 162/92-142/78 prior to taking her medication. She said that she has had some pain in her hip recently and she thinks this could be causing her BP to go up. Pt has been drinking garlic water and a garlic tablet in between doses of BP medication and thinks this is helping a bit. She would like to know what she should do, please advise.

## 2021-11-20 NOTE — Telephone Encounter (Signed)
Called and spoke to pt, pt stated that she has not been taking hydralizine since 2022 because we took her off of it. She is also not taking the isosorbide. She said her BP was 136/74 this morning and when she checked her BP with her husbands blipcare BP cuff it was 141/79. After taking her morning medication and a clonidine tablet her BP was 140/79. She stated that the fluctuation of her BP is stressing her out and she is not happy with how we are handling her medications. Please advise.

## 2021-11-20 NOTE — Telephone Encounter (Signed)
Please offer her an appt sometime this week.  Thanks MJP

## 2021-11-22 ENCOUNTER — Encounter: Payer: Self-pay | Admitting: Cardiology

## 2021-11-22 ENCOUNTER — Other Ambulatory Visit: Payer: Self-pay | Admitting: Cardiology

## 2021-11-22 ENCOUNTER — Ambulatory Visit: Payer: Medicare Other | Admitting: Cardiology

## 2021-11-22 VITALS — BP 145/77 | HR 62 | Temp 98.4°F | Resp 16 | Ht 66.0 in | Wt 123.4 lb

## 2021-11-22 DIAGNOSIS — I2510 Atherosclerotic heart disease of native coronary artery without angina pectoris: Secondary | ICD-10-CM

## 2021-11-22 DIAGNOSIS — E782 Mixed hyperlipidemia: Secondary | ICD-10-CM

## 2021-11-22 DIAGNOSIS — I10 Essential (primary) hypertension: Secondary | ICD-10-CM

## 2021-11-22 NOTE — Progress Notes (Signed)
Follow up visit  Subjective:   Sara Brown, female    DOB: 09-05-1940, 81 y.o.   MRN: [id number]   Chief Complaint  Patient presents with   Medication Management   Hypertension    81 y/o African-American female with hypertension, diet controlled diabetes, hyperlipidemia,  GERD and remote history of TIA, mild nonobstructive coronary artery disease (Cath 04/01/2018).  Patient's blood pressure has been elevated in the last few days.  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  She continues to be in severe pain from the same.  . Current Outpatient Medications:    ADVAIR DISKUS 100-50 MCG/DOSE AEPB, Inhale 1 puff into the lungs 2 (two) times daily., Disp: , Rfl:    albuterol (PROVENTIL HFA;VENTOLIN HFA) 108 (90 Base) MCG/ACT inhaler, Inhale 1 puff into the lungs every 4 (four) hours as needed for wheezing., Disp: , Rfl:    alendronate (FOSAMAX) 70 MG tablet, Take 70 mg by mouth every Saturday., Disp: , Rfl:    aspirin 81 MG EC tablet, , Disp: , Rfl:    Calcium Citrate-Vitamin D 315-250 MG-UNIT TABS, Take 2 tablets by mouth daily., Disp: , Rfl:    carboxymethylcellulose (REFRESH PLUS) 0.5 % SOLN, Place 1 drop into both eyes in the morning and at bedtime., Disp: , Rfl:    Cholecalciferol (VITAMIN D3) 10 MCG (400 UNIT) CAPS, Take 800 Units by mouth daily., Disp: , Rfl:    Clobetasol Prop Emollient Base (CLOBETASOL PROPIONATE E) 0.05 % emollient cream, Apply 1 application topically 2 (two) times daily as needed., Disp: 60 g, Rfl: 6   cloNIDine (CATAPRES) 0.1 MG tablet, TAKE 1 TABLET BY MOUTH AT BEDTIME , MAY TAKE ADDITIONAL 1 TAB IF BLOOD PRESSURE IS HIGH, Disp: 180 tablet, Rfl: 1   clopidogrel (PLAVIX) 75 MG tablet, Take 75 mg by mouth daily., Disp: , Rfl:    Coenzyme Q10 (COQ10) 200 MG CAPS, Take 200 mg by mouth daily., Disp: , Rfl:    cycloSPORINE (RESTASIS) 0.05 % ophthalmic emulsion, Place 1 drop into both  eyes 2 (two) times daily. , Disp: , Rfl:    fluticasone (FLONASE) 50 MCG/ACT nasal spray, Place 1 spray into both nostrils in the morning and at bedtime., Disp: , Rfl:    fluticasone-salmeterol (ADVAIR) 100-50 MCG/ACT AEPB, , Disp: , Rfl:    Garlic 1062 MG CAPS, Take 1,000 mg by mouth daily as needed (high blood pressure)., Disp: , Rfl:    LIVALO 2 MG TABS, TAKE 1 TABLET BY MOUTH EVERY DAY, Disp: 90 tablet, Rfl: 1   Magnesium 250 MG TABS, Take 250 mg by mouth daily., Disp: , Rfl:    Multiple Vitamins-Minerals (CENTRUM SILVER) CHEW, See admin instructions., Disp: , Rfl:    NIFEdipine (PROCARDIA-XL/NIFEDICAL-XL) 30 MG 24 hr tablet, TAKE 1 TABLET BY MOUTH EVERY DAY, Disp: 90 tablet, Rfl: 1   NON FORMULARY, [place] 1800, Disp: , Rfl:    omeprazole (PRILOSEC OTC) 20 MG tablet, Take 20 mg by mouth daily as needed (for reflux)., Disp: , Rfl:    polyethylene glycol (MIRALAX / GLYCOLAX) packet, Take 17 g by mouth daily as needed for moderate constipation., Disp: , Rfl:    pyridOXINE (VITAMIN B-6) 100 MG tablet, Take 100 mg by mouth daily. , Disp: , Rfl:    spironolactone (ALDACTONE) 50 MG tablet, Take 50 mg by mouth daily. , Disp: , Rfl:    triamcinolone cream (KENALOG) 0.1 %, Apply 1 application  topically 2 (two) times daily as needed (rash/ eczema). , Disp: , Rfl: 2  Cardiovascular & other pertient studies:  EKG 11/22/2021: Sinus rhythm 58 bpm Left anterior fascicular block   Renal artery duplex  12/14/2020:  No evidence of renal artery occlusive disease in either renal artery.  Normal intrarenal vascular perfusion is noted in both kidneys.  Renal length is within normal limits for both kidneys.  Diffuse plaque observed in the abdominal aorta. Normal abdominal aorta  flow velocities noted.  Coronary angiogram 04/01/2018: LM: Normal LAD: Prox-mid 40% stenosis. iFR 0.99 (Physiologically nonsignificant) LCx: Dominant. Small branches with mild disease RCA: Nondominant. Normal   LVEDP mildly  elevated.   Carotid artery duplex 02/23/2018: No hemodynamically significant arterial disease in the internal carotid artery bilaterally. No significant plaque burden. Antegrade right vertebral artery flow. Antegrade left vertebral artery flow.  Echocardiogram 10/29/2017: Left ventricle cavity is normal in size. Mild concentric hypertrophy of the left ventricle. Normal global wall motion. Normal diastolic filling pattern. Calculated EF 69%. No significant valvular abnormality.  Insignificant pericardial effusion.  Exercise myoview stress 10/20/2017: 1. The resting electrocardiogram demonstrated normal sinus rhythm, normal resting conduction and no resting arrhythmias.  The stress electrocardiogram was normal.  Patient exercised on Bruce protocol for 7:43 minutes and achieved 8.54 METS. Stress test terminated due to mid sternal chest pain and 90% % MPHR achieved (Target HR >85%). Normal BP. 2.  Myocardial perfusion imaging is normal. Overall left ventricular systolic function was normal without regional wall motion abnormalities. The left ventricular ejection fraction was 76%.  This is a low risk study.  Recent labs: 03/18/2021: Glucose 106, BUN/Cr 12/0.86. EGFR >60. Na/K 133/3.9. Rest of the CMP normal H/H 12/36. MCV 87. Platelets 243  11/13/2020: Glucose 144, BUN/Cr 16/0.8. EGFR >60. Na/K 139/4.0. Rest of the CMP normal Chol 180, TG 85, HDL 62, LDL 101  Review of Systems  Cardiovascular:  Negative for chest pain, dyspnea on exertion, leg swelling, palpitations and syncope.  Skin:  Positive for rash (Hives on back, now resolving).  Neurological:  Negative for headaches and paresthesias.  Psychiatric/Behavioral:  The patient is nervous/anxious.        Vitals:   11/22/21 1126  BP: (!) 145/77  Pulse: 62  Resp: 16  Temp: 98.4 F (36.9 C)  SpO2: 97%      Body mass index is 19.92 kg/m. Filed Weights   11/22/21 1126  Weight: 123 lb 6.4 oz (56 kg)     Objective:   Physical  Exam Vitals and nursing note reviewed.  Constitutional:      Appearance: She is well-developed.  Neck:     Vascular: No JVD.  Cardiovascular:     Rate and Rhythm: Normal rate and regular rhythm.     Pulses: Intact distal pulses.     Heart sounds: Normal heart sounds. No murmur heard. Pulmonary:     Effort: Pulmonary effort is normal.     Breath sounds: Normal breath sounds. No wheezing or rales.  Musculoskeletal:     Right lower leg: No edema.     Left lower leg: No edema.        Assessment & Recommendations:   81 y/o African-American female with hypertension, diet controlled diabetes, hyperlipidemia,  GERD and remote history of TIA, mild nonobstructive coronary artery disease (Cath 04/01/2018).  Hypertension: Intolerant to several medications. Recent increase in blood pressure likely attributed to pain. Not been any changes to antihypertensive therapy. Blood pressure continue to stay elevated despite of adequate pain  control, decrease dose of nifedipine.  Coronary artery disease involving native coronary artery of native heart without angina pectoris Continue risk factor modification with BP and lipid lowering therapy. EKG shows no ischemia.  Continue aspirin 81 mg daily, pitvastatin. She has not tolerated other statins in the past. She does not want to try injectables.  F/u in 4 weeks   [place], MD [facility] Pager: [phone] Office: [phone]

## 2021-12-18 ENCOUNTER — Ambulatory Visit: Payer: Medicare Other | Admitting: Neurology

## 2021-12-24 ENCOUNTER — Ambulatory Visit (INDEPENDENT_AMBULATORY_CARE_PROVIDER_SITE_OTHER): Payer: Medicare Other | Admitting: Podiatry

## 2021-12-24 ENCOUNTER — Encounter: Payer: Self-pay | Admitting: Podiatry

## 2021-12-24 DIAGNOSIS — E1142 Type 2 diabetes mellitus with diabetic polyneuropathy: Secondary | ICD-10-CM | POA: Diagnosis not present

## 2021-12-24 DIAGNOSIS — L84 Corns and callosities: Secondary | ICD-10-CM | POA: Diagnosis not present

## 2021-12-24 DIAGNOSIS — B351 Tinea unguium: Secondary | ICD-10-CM | POA: Diagnosis not present

## 2021-12-24 DIAGNOSIS — M79674 Pain in right toe(s): Secondary | ICD-10-CM

## 2021-12-24 DIAGNOSIS — M79675 Pain in left toe(s): Secondary | ICD-10-CM | POA: Diagnosis not present

## 2021-12-24 DIAGNOSIS — M461 Sacroiliitis, not elsewhere classified: Secondary | ICD-10-CM | POA: Insufficient documentation

## 2021-12-24 DIAGNOSIS — E2839 Other primary ovarian failure: Secondary | ICD-10-CM | POA: Insufficient documentation

## 2021-12-29 NOTE — Progress Notes (Signed)
  Subjective:  Patient ID: Sara Brown, female    DOB: [birth year],  MRN: [id number]  Destinee Taber [facility] presents to clinic today for at risk foot care with history of diabetic neuropathy and corn(s) L 2nd toe, callus(es) right lower extremity and painful mycotic nails.  Pain interferes with ambulation. Aggravating factors include wearing enclosed shoe gear. Painful toenails interfere with ambulation. Aggravating factors include wearing enclosed shoe gear. Pain is relieved with periodic professional debridement. Painful corns and calluses are aggravated when weightbearing with and without shoegear. Pain is relieved with periodic professional debridement.  Last A1c was 6.1%. Patient did not check blood glucose today.  New problem(s): None.   PCP is Seward Carol, MD , and last visit was  April, 2023.  Allergies  Allergen Reactions   Amlodipine Besylate     Other reaction(s): rash and hives   Hydralazine     Other reaction(s): rash   Losartan Potassium     Other reaction(s): rash and HIves   Naproxen Other (See Comments)    Patient preference  Other reaction(s): upset stomach   Prednisone Other (See Comments)    Patient Preference  Other reaction(s): upset stomach   Amlodipine Hives   Losartan Rash    Review of Systems: Negative except as noted in the HPI.  Objective:  Sara Brown is a pleasant 81 y.o. female in NAD. AAO X 3.  Vascular Examination: CFT immediate b/l LE. Palpable DP/PT pulses b/l LE. Digital hair absent b/l. Skin temperature gradient WNL b/l. No pain with calf compression b/l. No edema noted b/l. No cyanosis or clubbing noted b/l LE.  Dermatological Examination: Pedal integument with normal turgor, texture and tone b/l LE. No open wounds b/l. No interdigital macerations b/l. Toenails 1-5 b/l elongated, thickened, discolored with subungual debris. +Tenderness with dorsal palpation of nailplates. Hyperkeratotic lesion(s) noted dorsal PIPJ or  left 2nd toe and submet head 1 right foot.  Musculoskeletal Examination: Normal muscle strength 5/5 to all lower extremity muscle groups bilaterally. Hammertoe deformity noted 2-5 b/l.Marland Kitchen No pain, crepitus or joint limitation noted with ROM b/l LE.  Patient ambulates independently without assistive aids.  Neurological Examination: Protective sensation decreased with 10 gram monofilament b/l. Vibratory sensation intact b/l. Proprioception intact bilaterally.  Assessment/Plan: 1. Pain due to onychomycosis of toenails of both feet   2. Corns and callosities   3. Diabetic peripheral neuropathy associated with type 2 diabetes mellitus ([place])      -Examined patient. -Patient to continue soft, supportive shoe gear daily. -Mycotic toenails 1-5 bilaterally were debrided in length and girth with sterile nail nippers and dremel without incident. -Corn(s) L 2nd toe and callus(es) submet head 1 right foot were pared utilizing sterile scalpel blade without incident. Total number debrided =2. -Patient/POA to call should there be question/concern in the interim.   Return in about 3 months (around 03/26/2022).  Marzetta Board, DPM

## 2022-01-06 NOTE — Progress Notes (Unsigned)
Follow up visit  Subjective:   Sara Brown, female    DOB: 07/12/1940, 81 y.o.   MRN: [id number]   No chief complaint on file.   81 y/o African-American female with hypertension, diet controlled diabetes, hyperlipidemia,  GERD and remote history of TIA, mild nonobstructive coronary artery disease (Cath 04/01/2018).  Patient's blood pressure has been elevated in the last few days.  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  She continues to be in severe pain from the same.  . Current Outpatient Medications:    ADVAIR DISKUS 100-50 MCG/DOSE AEPB, Inhale 1 puff into the lungs 2 (two) times daily., Disp: , Rfl:    albuterol (PROVENTIL HFA;VENTOLIN HFA) 108 (90 Base) MCG/ACT inhaler, Inhale 1 puff into the lungs every 4 (four) hours as needed for wheezing., Disp: , Rfl:    alendronate (FOSAMAX) 70 MG tablet, Take 70 mg by mouth every Saturday., Disp: , Rfl:    aspirin EC 81 MG tablet, Take 1 tablet by mouth 5 (five) times daily., Disp: , Rfl:    atenolol (TENORMIN) 25 MG tablet, , Disp: , Rfl:    Calcium Citrate-Vitamin D 315-250 MG-UNIT TABS, Take 2 tablets by mouth daily., Disp: , Rfl:    carboxymethylcellulose (REFRESH PLUS) 0.5 % SOLN, Place 1 drop into both eyes in the morning and at bedtime., Disp: , Rfl:    cefdinir (OMNICEF) 300 MG capsule, , Disp: , Rfl:    cephALEXin (KEFLEX) 500 MG capsule, , Disp: , Rfl:    Cholecalciferol (VITAMIN D3) 10 MCG (400 UNIT) CAPS, Take 800 Units by mouth daily., Disp: , Rfl:    clindamycin (CLEOCIN) 300 MG capsule, , Disp: , Rfl:    clobetasol cream (TEMOVATE) 0.05 %, APPLY TO AFFECTED AREA DAILY, Disp: , Rfl:    Clobetasol Prop Emollient Base (CLOBETASOL PROPIONATE E) 0.05 % emollient cream, Apply 1 application topically 2 (two) times daily as needed., Disp: 60 g, Rfl: 6   cloNIDine (CATAPRES) 0.1 MG tablet, TAKE 1 TABLET BY MOUTH AT BEDTIME , MAY TAKE ADDITIONAL 1 TAB  IF BLOOD PRESSURE IS HIGH, Disp: 180 tablet, Rfl: 1   clopidogrel (PLAVIX) 75 MG tablet, Take 75 mg by mouth daily., Disp: , Rfl:    Coenzyme Q10 (COQ10) 200 MG CAPS, Take 200 mg by mouth daily., Disp: , Rfl:    colesevelam (WELCHOL) 625 MG tablet, , Disp: , Rfl:    cycloSPORINE (RESTASIS) 0.05 % ophthalmic emulsion, Place 1 drop into both eyes 2 (two) times daily. , Disp: , Rfl:    fenofibrate 160 MG tablet, , Disp: , Rfl:    fluticasone (FLONASE) 50 MCG/ACT nasal spray, Place 1 spray into both nostrils in the morning and at bedtime., Disp: , Rfl:    fluticasone-salmeterol (ADVAIR) 100-50 MCG/ACT AEPB, , Disp: , Rfl:    Garlic 5916 MG CAPS, Take 1,000 mg by mouth daily as needed (high blood pressure)., Disp: , Rfl:    hydrALAZINE (APRESOLINE) 10 MG tablet, TAKE 1 TABLET BY MOUTH TWICE DAILY AS NEEDED FOR SYSTOLIC BLOOD PRESSURE GREATER THAN 135, Disp: , Rfl:    hydrALAZINE (APRESOLINE) 100 MG tablet, TAKE 1 TABLET BY MOUTH THREE TIMES A DAY FOR 90 DAYS, Disp: , Rfl:    hydrALAZINE (APRESOLINE) 25 MG tablet, TAKE 1.5 TABLETS (37.5 MG TOTAL) BY MOUTH 3 (THREE) TIMES DAILY., Disp: , Rfl:    ibuprofen (ADVIL) 600 MG tablet, , Disp: , Rfl:  isosorbide dinitrate (ISORDIL) 20 MG tablet, TAKE 2 TABLETS BY MOUTH 3 TIMES DAILY., Disp: , Rfl:    isosorbide-hydrALAZINE (BIDIL) 20-37.5 MG tablet, TAKE 2 TABS BY MOUTH 2 TIMES A DAY. MAY TAKE ADDITIONAL 1 2 TABLETS AS NEEDED FOR HYPERTENSION, Disp: , Rfl:    Lansoprazole (PREVACID PO), , Disp: , Rfl:    levETIRAcetam (KEPPRA) 500 MG tablet, Take 1 tablet by mouth every 12 (twelve) hours., Disp: , Rfl:    levofloxacin (LEVAQUIN) 250 MG tablet, , Disp: , Rfl:    linaCLOtide (LINZESS PO), , Disp: , Rfl:    LIVALO 2 MG TABS, TAKE 1 TABLET BY MOUTH EVERY DAY, Disp: 90 tablet, Rfl: 1   Magnesium 250 MG TABS, Take 250 mg by mouth daily., Disp: , Rfl:    mirtazapine (REMERON) 15 MG tablet, , Disp: , Rfl:    Multiple Vitamins-Minerals (CENTRUM SILVER) CHEW, See  admin instructions., Disp: , Rfl:    nebivolol (BYSTOLIC) 5 MG tablet, , Disp: , Rfl:    NIFEdipine (PROCARDIA-XL/NIFEDICAL-XL) 30 MG 24 hr tablet, TAKE 1 TABLET BY MOUTH EVERY DAY, Disp: 90 tablet, Rfl: 1   NON FORMULARY, [place] 1800, Disp: , Rfl:    nystatin (MYCOSTATIN) 100000 UNIT/ML suspension, , Disp: , Rfl:    ofloxacin (OCUFLOX) 0.3 % ophthalmic solution, PLACE 5 DROPS INTO THE LEFT EAR 2 TIMES DAILY FOR 5 DAYS., Disp: , Rfl:    omeprazole (PRILOSEC OTC) 20 MG tablet, Take 20 mg by mouth daily as needed (for reflux)., Disp: , Rfl:    polyethylene glycol (MIRALAX / GLYCOLAX) packet, Take 17 g by mouth daily as needed for moderate constipation., Disp: , Rfl:    polyethylene glycol-electrolytes (NULYTELY) 420 g solution, , Disp: , Rfl:    pravastatin (PRAVACHOL) 40 MG tablet, , Disp: , Rfl:    pyridOXINE (VITAMIN B-6) 100 MG tablet, Take 100 mg by mouth daily. , Disp: , Rfl:    rOPINIRole (REQUIP) 0.25 MG tablet, , Disp: , Rfl:    sertraline (ZOLOFT) 50 MG tablet, TAKE 1 TABLET BY MOUTH EVERY DAY FOR 30 DAYS, Disp: , Rfl:    simvastatin (ZOCOR) 40 MG tablet, , Disp: , Rfl:    spironolactone (ALDACTONE) 50 MG tablet, Take 50 mg by mouth daily. , Disp: , Rfl:    traMADol (ULTRAM) 50 MG tablet, , Disp: , Rfl:    triamcinolone cream (KENALOG) 0.1 %, Apply 1 application topically 2 (two) times daily as needed (rash/ eczema). , Disp: , Rfl: 2  Cardiovascular & other pertient studies:  EKG 11/22/2021: Sinus rhythm 58 bpm Left anterior fascicular block   Renal artery duplex  12/14/2020:  No evidence of renal artery occlusive disease in either renal artery.  Normal intrarenal vascular perfusion is noted in both kidneys.  Renal length is within normal limits for both kidneys.  Diffuse plaque observed in the abdominal aorta. Normal abdominal aorta  flow velocities noted.  Coronary angiogram 04/01/2018: LM: Normal LAD: Prox-mid 40% stenosis. iFR 0.99 (Physiologically nonsignificant) LCx:  Dominant. Small branches with mild disease RCA: Nondominant. Normal   LVEDP mildly elevated.   Carotid artery duplex 02/23/2018: No hemodynamically significant arterial disease in the internal carotid artery bilaterally. No significant plaque burden. Antegrade right vertebral artery flow. Antegrade left vertebral artery flow.  Echocardiogram 10/29/2017: Left ventricle cavity is normal in size. Mild concentric hypertrophy of the left ventricle. Normal global wall motion. Normal diastolic filling pattern. Calculated EF 69%. No significant valvular abnormality.  Insignificant pericardial effusion.  Exercise myoview  stress 10/20/2017: 1. The resting electrocardiogram demonstrated normal sinus rhythm, normal resting conduction and no resting arrhythmias.  The stress electrocardiogram was normal.  Patient exercised on Bruce protocol for 7:43 minutes and achieved 8.54 METS. Stress test terminated due to mid sternal chest pain and 90% % MPHR achieved (Target HR >85%). Normal BP. 2.  Myocardial perfusion imaging is normal. Overall left ventricular systolic function was normal without regional wall motion abnormalities. The left ventricular ejection fraction was 76%.  This is a low risk study.  Recent labs: 03/18/2021: Glucose 106, BUN/Cr 12/0.86. EGFR >60. Na/K 133/3.9. Rest of the CMP normal H/H 12/36. MCV 87. Platelets 243  11/13/2020: Glucose 144, BUN/Cr 16/0.8. EGFR >60. Na/K 139/4.0. Rest of the CMP normal Chol 180, TG 85, HDL 62, LDL 101  Review of Systems  Cardiovascular:  Negative for chest pain, dyspnea on exertion, leg swelling, palpitations and syncope.  Skin:  Positive for rash (Hives on back, now resolving).  Neurological:  Negative for headaches and paresthesias.  Psychiatric/Behavioral:  The patient is nervous/anxious.        There were no vitals filed for this visit.     There is no height or weight on file to calculate BMI. There were no vitals filed for this  visit.    Objective:   Physical Exam Vitals and nursing note reviewed.  Constitutional:      Appearance: She is well-developed.  Neck:     Vascular: No JVD.  Cardiovascular:     Rate and Rhythm: Normal rate and regular rhythm.     Pulses: Intact distal pulses.     Heart sounds: Normal heart sounds. No murmur heard. Pulmonary:     Effort: Pulmonary effort is normal.     Breath sounds: Normal breath sounds. No wheezing or rales.  Musculoskeletal:     Right lower leg: No edema.     Left lower leg: No edema.        Assessment & Recommendations:   81 y/o African-American female with hypertension, diet controlled diabetes, hyperlipidemia,  GERD and remote history of TIA, mild nonobstructive coronary artery disease (Cath 04/01/2018).  ***Hypertension: Intolerant to several medications. Recent increase in blood pressure likely attributed to pain. Not been any changes to antihypertensive therapy. Blood pressure continue to stay elevated despite of adequate pain control, decrease dose of nifedipine.  Coronary artery disease involving native coronary artery of native heart without angina pectoris Continue risk factor modification with BP and lipid lowering therapy. EKG shows no ischemia.  Continue aspirin 81 mg daily, pitvastatin. She has not tolerated other statins in the past. She does not want to try injectables.  F/u in 4 weeks   [place], MD [facility] Pager: [phone] Office: [phone]

## 2022-01-07 ENCOUNTER — Ambulatory Visit: Payer: Medicare Other | Admitting: Cardiology

## 2022-01-07 ENCOUNTER — Encounter: Payer: Self-pay | Admitting: Cardiology

## 2022-01-07 VITALS — BP 131/68 | HR 68 | Temp 97.9°F | Resp 16 | Ht 66.0 in | Wt 125.6 lb

## 2022-01-07 DIAGNOSIS — I2510 Atherosclerotic heart disease of native coronary artery without angina pectoris: Secondary | ICD-10-CM | POA: Diagnosis not present

## 2022-01-07 DIAGNOSIS — I10 Essential (primary) hypertension: Secondary | ICD-10-CM

## 2022-01-07 MED ORDER — ASPIRIN 81 MG PO TBEC: 81.0000 mg | DELAYED_RELEASE_TABLET | Freq: Every day | ORAL | 3 refills | Status: AC

## 2022-01-23 ENCOUNTER — Telehealth: Payer: Self-pay

## 2022-01-23 ENCOUNTER — Emergency Department (HOSPITAL_COMMUNITY)
Admission: EM | Admit: 2022-01-23 | Discharge: 2022-01-24 | Disposition: A | Discharge status: Home / Self Care | Payer: Medicare Other | Attending: Emergency Medicine | Admitting: Emergency Medicine

## 2022-01-23 ENCOUNTER — Other Ambulatory Visit: Payer: Self-pay

## 2022-01-23 ENCOUNTER — Encounter (HOSPITAL_COMMUNITY): Payer: Self-pay | Admitting: Emergency Medicine

## 2022-01-23 ENCOUNTER — Emergency Department (HOSPITAL_COMMUNITY): Payer: Medicare Other

## 2022-01-23 DIAGNOSIS — I2510 Atherosclerotic heart disease of native coronary artery without angina pectoris: Secondary | ICD-10-CM | POA: Diagnosis not present

## 2022-01-23 DIAGNOSIS — R0789 Other chest pain: Secondary | ICD-10-CM | POA: Diagnosis not present

## 2022-01-23 DIAGNOSIS — Z23 Encounter for immunization: Secondary | ICD-10-CM | POA: Diagnosis not present

## 2022-01-23 DIAGNOSIS — E119 Type 2 diabetes mellitus without complications: Secondary | ICD-10-CM | POA: Insufficient documentation

## 2022-01-23 DIAGNOSIS — R200 Anesthesia of skin: Secondary | ICD-10-CM | POA: Diagnosis not present

## 2022-01-23 DIAGNOSIS — R208 Other disturbances of skin sensation: Secondary | ICD-10-CM | POA: Diagnosis not present

## 2022-01-23 DIAGNOSIS — R072 Precordial pain: Secondary | ICD-10-CM | POA: Diagnosis not present

## 2022-01-23 DIAGNOSIS — Z7982 Long term (current) use of aspirin: Secondary | ICD-10-CM | POA: Diagnosis not present

## 2022-01-23 DIAGNOSIS — R29818 Other symptoms and signs involving the nervous system: Secondary | ICD-10-CM | POA: Diagnosis not present

## 2022-01-23 DIAGNOSIS — R079 Chest pain, unspecified: Secondary | ICD-10-CM | POA: Diagnosis not present

## 2022-01-23 LAB — CBC
HCT: 39.4 % (ref 36.0–46.0)
Hemoglobin: 13.4 g/dL (ref 12.0–15.0)
MCH: 30 pg (ref 26.0–34.0)
MCHC: 34 g/dL (ref 30.0–36.0)
MCV: 88.1 fL (ref 80.0–100.0)
Platelets: 245 10*3/uL (ref 150–400)
RBC: 4.47 MIL/uL (ref 3.87–5.11)
RDW: 13.2 % (ref 11.5–15.5)
WBC: 7.1 10*3/uL (ref 4.0–10.5)
nRBC: 0 % (ref 0.0–0.2)

## 2022-01-23 LAB — TROPONIN I (HIGH SENSITIVITY)
Troponin I (High Sensitivity): 3 ng/L (ref <18)
Troponin I (High Sensitivity): 4 ng/L (ref <18)

## 2022-01-23 LAB — BASIC METABOLIC PANEL
Anion gap: 8 (ref 5–15)
BUN: 24 mg/dL — ABNORMAL HIGH (ref 8–23)
CO2: 23 mmol/L (ref 22–32)
Calcium: 9.5 mg/dL (ref 8.9–10.3)
Chloride: 106 mmol/L (ref 98–111)
Creatinine, Ser: 0.77 mg/dL (ref 0.44–1.00)
GFR, Estimated: >60 mL/min (ref >60)
Glucose, Bld: 114 mg/dL — ABNORMAL HIGH (ref 70–99)
Potassium: 4.3 mmol/L (ref 3.5–5.1)
Sodium: 137 mmol/L (ref 135–145)

## 2022-01-23 NOTE — ED Provider Triage Note (Cosign Needed Addendum)
Emergency Medicine Provider Triage Evaluation Note  Sara Brown , a 81 y.o. female  was evaluated in triage.  Pt complains of chest pain, labored breathing. Chest pain is sharp. Reports some feeling of heaviness in tongue. Not exertional. Has been seen multiple times for similar. Due to odd tongue and face sensation PCP wanted to rule out TIA / stroke.  Review of Systems  Positive: Chest pain, labored breathing Negative: Nausea, vomiting  Physical Exam  BP 133/88 (BP Location: Right Arm)   Temp 98 F (36.7 C) (Oral)   Resp 17   SpO2 98%  Gen:   Awake, no distress   Resp:  Normal effort  MSK:   Moves extremities without difficulty  Other:  No facial droop or focal weakness noted on my exam  Medical Decision Making  Medically screening exam initiated at 5:12 PM.  Appropriate orders placed.  Sara Brown was informed that the remainder of the evaluation will be completed by another provider, this initial triage assessment does not replace that evaluation, and the importance of remaining in the ED until their evaluation is complete.  Workup initiated      Anselmo Pickler, [place] 01/23/22 1716

## 2022-01-23 NOTE — ED Triage Notes (Signed)
Pt presents with CP that has been ongoing.  Pt states she called her doctor who "let me know this has been going on for a while"  Pt states she also feels a "tightness" in her tongue and lips.  Pt is also concerned about her BP because she states if her diastolic is above 80 "that means I am sick"

## 2022-01-23 NOTE — Telephone Encounter (Signed)
Pt called to inform us that she has Pain start under the arm on the left side and goes to her chest. Pt has pain only with fatigue  Denies tingly, dizziness, blurry vision , no swelling, nor sob. Pt is upset and would like for you to please call her back [phone]

## 2022-01-23 NOTE — Telephone Encounter (Signed)
If severe, needs to go to ER. I will call at the earliest chance I get.  Thanks MJP

## 2022-01-23 NOTE — Telephone Encounter (Signed)
Spoke with the patient. Patient has had episodes of severe pain in her chest and left arm. No severe pain today. "Chest is bothering me consistently". "Leg and face feeling funny on left side, as well as tongue". Similar to previous episodes when she has gone top ER, workup for acute stroke has been negative. I explained to the patient that MI or stroke/TIA cannot be ruled out over the phone and ER visit will be the safest option.   On a separate note, patient is upset with me that she did not get a response to her call from 6 days ago. I am unaware of a call 6 days ago and I will check with my staff.  Time spent: 8 min   Nigel Mormon, MD Pager: [phone] Office: [phone]

## 2022-01-23 NOTE — Telephone Encounter (Signed)
Patient will go to ER is discomfort under arm worsens, and will wait for your call.

## 2022-01-24 NOTE — Discharge Instructions (Addendum)
Your chest pressure/pain was worked-up in the ER and did not show any signs of an acute heart attack. If you begin to have pain that is worse with pressing on the area you can try over the counter Ibuprofen and Tylenol.  Your blood pressure was elevated while in the ER, make sure to take your medications when you get home and to follow-up with your cardiologist and neurologist.  Please return to the ER if you have crushing chest pain, difficulty breathing with walking, sudden shortness of breath, chest pain with nausea/vomiting/profuse sweating

## 2022-01-24 NOTE — ED Notes (Signed)
Got patient undressed on the monitor patient is resting with call bell in reach 

## 2022-01-24 NOTE — ED Provider Notes (Signed)
[facility] EMERGENCY DEPARTMENT Provider Note   CSN: [id number] Arrival date & time: 01/23/22  1636     History  Chief Complaint  Patient presents with   Chest Pain    Sara Brown is a 81 y.o. female with history of mild nonobstructive CAD, bradycardia, TIA, HLD, T2DM presenting with chest pain. Patient notes that her doctors told her "this has been going on for a while". She is also feeling "tightness" in her tongue and lips". This sensation typically happens when she her blood pressure is high and the symptoms are usually resolved a bit after taking her medications.   She mainly came in because she was having chest discomfort that started yesterday. It initially started in her lateral chest wall and traveled more towards the front. The area does hurt more if you push on it. She has no other associated symptoms as listed below   Chest Pain Associated symptoms: no abdominal pain, no cough, no diaphoresis, no dizziness, no dysphagia, no fatigue, no nausea, no palpitations, no shortness of breath, no vomiting and no weakness        Home Medications Prior to Admission medications   Medication Sig Start Date End Date Taking? Authorizing Provider  ADVAIR DISKUS 100-50 MCG/DOSE AEPB Inhale 1 puff into the lungs 2 (two) times daily. 07/26/20   [provider]  albuterol (PROVENTIL HFA;VENTOLIN HFA) 108 (90 Base) MCG/ACT inhaler Inhale 1 puff into the lungs every 4 (four) hours as needed for wheezing. 07/23/18   [provider]  alendronate (FOSAMAX) 70 MG tablet Take 70 mg by mouth every Saturday. 08/09/18   [provider]  aspirin EC 81 MG tablet Take 1 tablet (81 mg total) by mouth daily. Swallow whole. 01/07/22   Patwardhan, Reynold Bowen, MD  Calcium Citrate-Vitamin D 315-250 MG-UNIT TABS Take 2 tablets by mouth daily.    [provider]  carboxymethylcellulose (REFRESH PLUS) 0.5 % SOLN Place 1 drop into both eyes in the morning  and at bedtime.    [provider]  Cholecalciferol (VITAMIN D3) 10 MCG (400 UNIT) CAPS Take 800 Units by mouth daily.    [provider]  clobetasol cream (TEMOVATE) 0.05 % APPLY TO AFFECTED AREA DAILY    [provider]  Clobetasol Prop Emollient Base (CLOBETASOL PROPIONATE E) 0.05 % emollient cream Apply 1 application topically 2 (two) times daily as needed. 07/02/21   Lavonna Monarch, MD  cloNIDine (CATAPRES) 0.1 MG tablet TAKE 1 TABLET BY MOUTH AT BEDTIME , MAY TAKE ADDITIONAL 1 TAB IF BLOOD PRESSURE IS HIGH 03/07/21   Patwardhan, Manish J, MD  Coenzyme Q10 (COQ10) 200 MG CAPS Take 200 mg by mouth daily.    [provider]  cycloSPORINE (RESTASIS) 0.05 % ophthalmic emulsion Place 1 drop into both eyes 2 (two) times daily.     [provider]  fenofibrate 160 MG tablet     [provider]  fluticasone (FLONASE) 50 MCG/ACT nasal spray Place 1 spray into both nostrils in the morning and at bedtime.    [provider]  fluticasone-salmeterol (ADVAIR) 100-50 MCG/ACT AEPB     [provider]  Garlic 7846 MG CAPS Take 1,000 mg by mouth daily as needed (high blood pressure).    [provider]  ibuprofen (ADVIL) 600 MG tablet     [provider]  LIVALO 2 MG TABS TAKE 1 TABLET BY MOUTH EVERY DAY 11/22/21   Patwardhan, Reynold Bowen, MD  Magnesium 250 MG  TABS Take 250 mg by mouth daily.    [provider]  Multiple Vitamins-Minerals (CENTRUM SILVER) CHEW See admin instructions.    [provider]  NON Alden Hipp [phone]    [provider]  nystatin (MYCOSTATIN) 100000 UNIT/ML suspension     [provider]  polyethylene glycol (MIRALAX / GLYCOLAX) packet Take 17 g by mouth daily as needed for moderate constipation.    [provider]  polyethylene glycol-electrolytes (NULYTELY) 420 g solution     [provider]  pyridOXINE (VITAMIN B-6) 100 MG tablet Take 100 mg  by mouth daily.     [provider]  spironolactone (ALDACTONE) 50 MG tablet Take 50 mg by mouth daily.     [provider]  triamcinolone cream (KENALOG) 0.1 % Apply 1 application topically 2 (two) times daily as needed (rash/ eczema).  11/01/14   [provider]      Allergies    Amlodipine besylate, Hydralazine, Losartan potassium, Naproxen, Prednisone, Amlodipine, and Losartan    Review of Systems   Review of Systems  Constitutional:  Negative for activity change, appetite change, diaphoresis and fatigue.  HENT:  Negative for congestion and trouble swallowing.   Eyes:  Negative for visual disturbance.  Respiratory:  Positive for chest tightness. Negative for cough, shortness of breath and wheezing.   Cardiovascular:  Positive for chest pain. Negative for palpitations and leg swelling.  Gastrointestinal:  Negative for abdominal distention, abdominal pain, diarrhea, nausea and vomiting.  Genitourinary:  Negative for difficulty urinating.  Musculoskeletal:  Negative for arthralgias.  Neurological:  Negative for dizziness, tremors and weakness.    Physical Exam Updated Vital Signs BP 139/76 (BP Location: Right Arm)   Pulse (!) 52   Temp 97.8 F (36.6 C) (Oral)   Resp 14   Ht '5\' 6"'$  (1.676 m)   Wt 57 kg   SpO2 98%   BMI 20.28 kg/m  Physical Exam Constitutional:      Appearance: She is well-developed and normal weight.  HENT:     Head: Normocephalic and atraumatic.  Eyes:     Extraocular Movements: Extraocular movements intact.  Cardiovascular:     Rate and Rhythm: Normal rate and regular rhythm.     Heart sounds: Normal heart sounds.  Pulmonary:     Effort: Pulmonary effort is normal.  Chest:     Chest wall: Tenderness (over the left chest wall) present.  Abdominal:     General: Bowel sounds are normal.     Palpations: Abdomen is soft.  Musculoskeletal:        General: Normal range of motion.     Cervical back: Normal range of motion and  neck supple.  Skin:    General: Skin is warm and dry.     Capillary Refill: Capillary refill takes less than 2 seconds.  Neurological:     Mental Status: She is alert.     ED Results / Procedures / Treatments   Labs (all labs ordered are listed, but only abnormal results are displayed) Labs Reviewed  BASIC METABOLIC PANEL - Abnormal; Notable for the following components:      Result Value   Glucose, Bld 114 (*)    BUN 24 (*)    All other components within normal limits  CBC  TROPONIN I (HIGH SENSITIVITY)  TROPONIN I (HIGH SENSITIVITY)    EKG EKG Interpretation  Date/Time:  Wednesday January 23 2022 17:26:12 EDT Ventricular Rate:  64 PR Interval:  164 QRS Duration:  84 QT Interval:  412 QTC Calculation: 425 R Axis:   -27 Text Interpretation: Normal sinus rhythm When compared with ECG of 18-Mar-2021 19:16, PREVIOUS ECG IS PRESENT Confirmed by Campbell Stall ([id number]) on 12/04/348 9:57:01 AM  Radiology CT Head Wo Contrast  Result Date: 01/23/2022 CLINICAL DATA:  Neuro deficit, altered sensation EXAM: CT HEAD WITHOUT CONTRAST TECHNIQUE: Contiguous axial images were obtained from the base of the skull through the vertex without intravenous contrast. RADIATION DOSE REDUCTION: This exam was performed according to the departmental dose-optimization program which includes automated exposure control, adjustment of the mA and/or kV according to patient size and/or use of iterative reconstruction technique. COMPARISON:  11/13/2020 FINDINGS: Brain: No evidence of acute infarction, hemorrhage, hydrocephalus, extra-axial collection or mass lesion/mass effect. Mild periventricular white matter hypodensity. Vascular: No hyperdense vessel or unexpected calcification. Skull: Normal. Negative for fracture or focal lesion. Sinuses/Orbits: No acute finding. Other: None. IMPRESSION: No acute intracranial pathology. Small-vessel white matter disease. Consider MRI to more sensitively evaluate for acute  diffusion restricting infarction if suspected. Electronically Signed   By: Delanna Ahmadi M.D.   On: 01/23/2022 19:18   DG Chest 2 View  Result Date: 01/23/2022 CLINICAL DATA:  chest pain EXAM: CHEST - 2 VIEW COMPARISON:  Chest x-ray 03/18/2021, chest x-ray 09/24/2018, CT chest 05/19/2003 FINDINGS: The heart and mediastinal contours are unchanged. Hyperinflation of the lungs. Bibasilar streaky airspace opacities. Persistent nodular-like density on lateral view overlying the lower lobes. No focal consolidation. No pulmonary edema. No pleural effusion. No pneumothorax. No acute osseous abnormality. IMPRESSION: 1. No active cardiopulmonary disease. 2. Persistent nodular-like density on lateral view overlying the lower lobes. Consider CT chest for further evaluation. Electronically Signed   By: Iven Finn M.D.   On: 01/23/2022 18:39    Procedures Procedures   Medications Ordered in ED Medications - No data to display  ED Course/ Medical Decision Making/ A&P                           Medical Decision Making  Mehek Grega is a 81 y.o. female with history of mild nonobstructive CAD, bradycardia, TIA, HLD, T2DM presenting with chest pain that began yesterday and was a pressure like sensation in the left lateral chest that has slightly improved.  Troponin reassuringly normal and negative x2. EKG largely unchanged when compared to prior, so unlikely to be any acute cardiac etiology. Reassured with worsening of pressure sensation with palpation of the area.   Patient was also reporting some right sided numbness and odd sensations on in her face and tongue. CT head was negative for acute findings. Patient reports that these symptoms occur when she has elevated blood pressures and is relieved after getting her medications. She has had a similar presentation in the past and has had negative stroke work-ups. Given these symptoms improve with blood pressure improvement per patient, do not feel that  a MRI is currently warranted.  Heart score was 4. Patient was discharged home with return precautions and instructed to take her medications and follow-up with her neurologist and cardiologist as needed.    Final Clinical Impression(s) / ED Diagnoses Final diagnoses:  Atypical chest pain    Rx / DC Orders ED Discharge Orders     None         Darya Bigler, DO 09/38/18 [id number]    Gray, [place] P, DO 71/69/67 1559

## 2022-01-28 ENCOUNTER — Other Ambulatory Visit: Payer: Self-pay | Admitting: Internal Medicine

## 2022-01-28 DIAGNOSIS — R9389 Abnormal findings on diagnostic imaging of other specified body structures: Secondary | ICD-10-CM

## 2022-02-05 ENCOUNTER — Telehealth: Payer: Self-pay

## 2022-02-05 NOTE — Telephone Encounter (Signed)
Called Sara Brown to inform her about the message above.

## 2022-02-05 NOTE — Telephone Encounter (Signed)
Sara Brown from home healthcare called asking when should the patient take and extra clonidine. What should her bp be

## 2022-02-05 NOTE — Telephone Encounter (Signed)
SBP >160 mmHg  Thanks MJP

## 2022-02-15 DIAGNOSIS — H35033 Hypertensive retinopathy, bilateral: Secondary | ICD-10-CM | POA: Diagnosis not present

## 2022-02-15 DIAGNOSIS — H40023 Open angle with borderline findings, high risk, bilateral: Secondary | ICD-10-CM | POA: Diagnosis not present

## 2022-02-15 DIAGNOSIS — H35363 Drusen (degenerative) of macula, bilateral: Secondary | ICD-10-CM | POA: Diagnosis not present

## 2022-02-15 DIAGNOSIS — H04123 Dry eye syndrome of bilateral lacrimal glands: Secondary | ICD-10-CM | POA: Diagnosis not present

## 2022-02-22 ENCOUNTER — Ambulatory Visit
Admission: RE | Admit: 2022-02-22 | Discharge: 2022-02-22 | Disposition: A | Discharge status: Home / Self Care | Payer: Medicare Other | Source: Ambulatory Visit | Attending: Internal Medicine | Admitting: Internal Medicine

## 2022-02-22 DIAGNOSIS — I700 Atherosclerosis of aorta: Secondary | ICD-10-CM | POA: Diagnosis not present

## 2022-02-22 DIAGNOSIS — R9389 Abnormal findings on diagnostic imaging of other specified body structures: Secondary | ICD-10-CM

## 2022-03-01 ENCOUNTER — Telehealth: Payer: Self-pay

## 2022-03-01 NOTE — Telephone Encounter (Signed)
Called pt no answer left a vm

## 2022-03-19 ENCOUNTER — Encounter: Payer: Self-pay | Admitting: Neurology

## 2022-03-19 ENCOUNTER — Ambulatory Visit (INDEPENDENT_AMBULATORY_CARE_PROVIDER_SITE_OTHER): Payer: Medicare Other | Admitting: Neurology

## 2022-03-19 VITALS — BP 143/74 | HR 78 | Ht 66.5 in | Wt 128.0 lb

## 2022-03-19 DIAGNOSIS — I2510 Atherosclerotic heart disease of native coronary artery without angina pectoris: Secondary | ICD-10-CM | POA: Diagnosis not present

## 2022-03-19 DIAGNOSIS — R29818 Other symptoms and signs involving the nervous system: Secondary | ICD-10-CM

## 2022-03-19 NOTE — Progress Notes (Signed)
NEUROLOGY FOLLOW UP OFFICE NOTE  Sara Brown [id number] [birth year]  HISTORY OF PRESENT ILLNESS: I had the pleasure of seeing Sara Brown in follow-up in the neurology clinic on 03/19/2022.  The patient was last seen 5 months ago for concern for seizures. She is alone in the office today. She has a history of recurrent intermittent episodes of paresthesias on one side of her body, initially reporting symptoms on the right side, but other times on the left. Her routine EEGs and her 24-hour EEG in 03/2021 showed occasional focal slowing over the left temporal region, no epileptiform discharges seen. Repeat brain MRIs have not shown any acute changes, no structural abnormality on the left temporal region. She was prescribed Levetiracetam '500mg'$  BID on hospital admission in June 2022 for right-sided symptoms. On her last visit, she denied any focal symptoms or seizure-like symptoms, we had discussed weaning off Levetiracetam and monitoring symptoms off medication. She was in the ER on 01/23/22 for chest pain, she also reported some right-sided numbness and odd sensations in her face and tongue. Head CT no acute changes. She reported these symptoms occur with BP is elevated and improve when BP is better. She denies any staring/unresponsive episodes. She still has the sensations around her mouth and face with either elevated or low BP. She has "funny feelings" in different spots on her head, when she bends down, she notices reduced hearing. She tripped on 9/30, no significant injuries.    History on Initial Assessment 03/01/2021: This is an 81 year old right-handed woman with a history of hypertension, hyperlipidemia, diabetes, neuropathy, left carotid stenosis, presenting for second opinion regarding seizures. She had been seeing neurologist Dr. Rexene Alberts since 2016 for several year history of intermittent abnormal sensations in her right face, right arm, pulling sensation in right arm and shoulders.  Work-up in 2016 showed a normal EEG and unremarkable brain MRI. She was in the ER in 02/2018 for facial paresthesias and discomfort, at that time she reported symptoms on the left arm and face, as well as tongue. Head CT no acute changes. He had a repeat brain MRI in 1/202 with no acute changes, mild chronic microvascular disease. She was in the ER in 08/2020 due to elevated BP, she reported symptoms on the left side of her face, lip, and tongue. She also felt an abnormal sensation in her left leg like something was moving, making her feel as though she could not walk, bu tin the ER, she reported it may have occurred on the right leg but not as pronounced on the left. MRI brain and MRA head and neck in 08/2020 did not show any acute changes or significant stenosis. EEG in 08/2020 reported focal slowing over the left temporal region. She was discharged home on Levetiracetam '500mg'$  BID. She was back in the ER on 11/13/20 for sudden-onset right-sided numbness involving the forehead, face, lip, arm and leg. She also reported clumsiness and heaviness in the right leg. Another brain MRI done did not show any acute changes, MRA no significant stenosis. EEG again reported left temporal slowing. Levetiracetam was continued and she was advised to take DAPT with aspirin and Plavix for 3 weeks, the Plavix thereafter.   She also notes that when her BP goes too high or low, she gets symptoms on the side of her face. That day in June, she felt it on the right side, with tightening or heaviness in her tongue and lip, no physical changes visible to her husband. Her husband  denies any staring/unresponsive episodes. She states her tongue is heavy right now because her BP is low. She had also been reporting headaches to Dr. Rexene Alberts. She states the pain on the top her her head has not been recent, she cannot recall the last time she had a headache. She states it is not a headache but a stabbing kind of pain. She feels dizzy when her BP is off.  She is asking about her memory, she reports being tested and score was 23/30. She is usually pretty good with remembering her medications. She uses the computer frequently and denies forgetting passwords. She has not been driving, but was not getting lost when she drove. She "probably does not sleep enough."  Her brother had epilepsy. Otherwise she had a normal birth and early development.  There is no history of febrile convulsions, CNS infections such as meningitis/encephalitis, significant traumatic brain injury, neurosurgical procedures.  Diagnostic Data: 24-hour EEG in 03/2021 showed occasional left temporal slowing, no epileptiform discharges. Episodes of chest sensations and heavy breathing did not show any EEG correlate.   Neuropsychological testing in 08/2021 with current test scores did not indicate a diagnosis of a cognitive disorder. There was an isolated impairment across a line orientation task, which may be due to vision changes. Vascular issues may also be contributing. Testing did not lateralize to the left hemisphere. Psychiatric distress, various stressors, chronic pain, and sleep disturbances could also create inefficiencies.     Lab Results  Component Value Date   TSH 3.258 09/05/2020   No results found for: VITAMINB12  Lab Results  Component Value Date   HGBA1C 5.9 (H) 11/14/2020    PAST MEDICAL HISTORY: Past Medical History:  Diagnosis Date   Age-related osteoporosis without current pathological fracture 08/18/2020   Allergic rhinitis    Angina at rest 02/09/2018   Arthritis    "fingers, knees" (02/09/2018)   Asthma    "stopped taking RX after dr said I don't have this" (02/09/2018)   Basal cell carcinoma    Bradycardia 02/09/2018   Carotid stenosis    ICA(L)   Chest pain due to coronary artery disease 02/09/2018   Chronic constipation 53/66/4403   Complication of anesthesia    "felt the cut w/one of my c-sections" (02/09/2018)   Coronary artery disease involving  native coronary artery of native heart without angina pectoris 09/17/2018   Diabetes mellitus type 2, controlled 03/31/2018   EEG abnormal    Essential hypertension 03/31/2018   Generalized anxiety disorder    GERD (gastroesophageal reflux disease)    Hardening of the aorta (main artery of the heart)    Headache    "years ago; before menopause" (02/09/2018)   Hirsutism 06/12/2017   Hyperglycemia due to type 2 diabetes mellitus 03/09/2021   Hyperlipidemia    Hyperpigmentation 02/13/2017   Hypertensive retinopathy    Irritable bowel syndrome    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   Mixed hyperlipidemia 12/29/2020   Myringotomy tube status 04/03/2021   Neck mass 01/17/2017   Neuropathic pain 03/09/2021   OA (osteoarthritis)    Osteoporosis    Palpitations 03/16/2021   Paresthesias 09/05/2020   Personal history of colonic polyps    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     albuterol (PROVENTIL HFA;VENTOLIN HFA) 108 (90 Base) MCG/ACT inhaler Inhale 1 puff into the lungs every 4 (four) hours as needed for wheezing.     alendronate (FOSAMAX) 70 MG tablet Take 70 mg by mouth every Saturday.     aspirin EC 81 MG tablet Take 1 tablet (81 mg total) by mouth daily. Swallow whole. 90 tablet 3   Calcium Citrate-Vitamin D 315-250 MG-UNIT TABS Take 2 tablets by mouth daily.     carboxymethylcellulose (REFRESH PLUS) 0.5 % SOLN Place 1 drop into both eyes in the morning and at bedtime.     Cholecalciferol (VITAMIN D3) 10 MCG (400 UNIT)  CAPS Take 800 Units by mouth daily.     clobetasol cream (TEMOVATE) 0.05 % APPLY TO AFFECTED AREA DAILY     Clobetasol Prop Emollient Base (CLOBETASOL PROPIONATE E) 0.05 % emollient cream Apply 1 application topically 2 (two) times daily as needed. 60 g 6   cloNIDine (CATAPRES) 0.1 MG tablet TAKE 1 TABLET BY MOUTH AT BEDTIME , MAY TAKE ADDITIONAL 1 TAB IF BLOOD PRESSURE IS HIGH 180 tablet 1   Coenzyme Q10 (COQ10) 200 MG CAPS Take 200 mg by mouth daily.     cycloSPORINE (RESTASIS) 0.05 % ophthalmic emulsion Place 1 drop into both eyes 2 (two) times daily.      fenofibrate 160 MG tablet      fluticasone (FLONASE) 50 MCG/ACT nasal spray Place 1 spray into both nostrils in the morning and at bedtime.     fluticasone-salmeterol (ADVAIR) 100-50 MCG/ACT AEPB      Garlic 7673 MG CAPS Take 1,000 mg by mouth daily as needed (high blood pressure).     ibuprofen (ADVIL) 600 MG tablet      LIVALO 2 MG TABS TAKE 1 TABLET BY MOUTH EVERY DAY 90 tablet 1   Magnesium 250 MG TABS Take 250 mg by mouth daily.     Multiple Vitamins-Minerals (CENTRUM SILVER) CHEW See admin instructions.     NON FORMULARY Garlique 1800     nystatin (MYCOSTATIN) 100000 UNIT/ML suspension      polyethylene glycol (MIRALAX / GLYCOLAX) packet Take 17 g by mouth daily as needed for moderate constipation.     polyethylene glycol-electrolytes (NULYTELY) 420 g solution      pyridOXINE (VITAMIN B-6) 100 MG tablet Take 100 mg by mouth daily.      spironolactone (ALDACTONE) 50 MG tablet Take 50 mg by mouth daily.      triamcinolone cream (KENALOG) 0.1 % Apply 1 application topically 2 (two) times daily as needed (rash/ eczema).   2   No current facility-administered medications on file prior to visit.    ALLERGIES: Allergies  Allergen Reactions   Amlodipine Besylate     Other reaction(s): rash and hives   Hydralazine     Other reaction(s): rash   Losartan Potassium     Other reaction(s): rash and HIves   Naproxen Other (See Comments)     Patient preference  Other reaction(s): upset stomach   Prednisone Other (See Comments)    Patient Preference  Other reaction(s): upset stomach   Amlodipine Hives   Losartan Rash    FAMILY HISTORY: Family History  Problem Relation Age of Onset   Diabetes Mellitus II Mother    Osteoporosis Mother    Hypertension  Mother    Diabetes Mellitus II Father    CVA Sister    CAD Sister    Hypertension Sister    Osteoarthritis Sister    Alcoholism Brother    Epilepsy Brother    Breast cancer Cousin 31   Dementia Cousin        suspected; never confirmed    SOCIAL HISTORY: Social History   Socioeconomic History   Marital status: Married    Spouse name: Bland Span   Number of children: 2   Years of education: 16   Highest education level: Bachelor's degree (e.g., BA, AB, BS)  Occupational History   Occupation: Retired  Tobacco Use   Smoking status: Never   Smokeless tobacco: Never  Vaping Use   Vaping Use: Never used  Substance and Sexual Activity   Alcohol use: Never   Drug use: Never   Sexual activity: Not Currently  Other Topics Concern   Not on file  Social History Narrative   Consumes no caffeine   Right handed   One story house    Social Determinants of Health   Financial Resource Strain: Low Risk  (03/31/2018)   Overall Financial Resource Strain (CARDIA)    Difficulty of Paying Living Expenses: Not hard at all  Food Insecurity: No Food Insecurity (03/31/2018)   Hunger Vital Sign    Worried About Running Out of Food in the Last Year: Never true    Ran Out of Food in the Last Year: Never true  Transportation Needs: No Transportation Needs (03/31/2018)   PRAPARE - Hydrologist (Medical): No    Lack of Transportation (Non-Medical): No  Physical Activity: Insufficiently Active (03/31/2018)   Exercise Vital Sign    Days of Exercise per Week: 1 day    Minutes of Exercise per Session: 30 min  Stress: No Stress Concern Present  (03/31/2018)   [place]    Feeling of Stress : Not at all  Social Connections: Moderately Integrated (03/31/2018)   Social Connection and Isolation Panel [NHANES]    Frequency of Communication with Friends and Family: More than three times a week    Frequency of Social Gatherings with Friends and Family: More than three times a week    Attends Religious Services: 1 to 4 times per year    Active Member of [employer] or Organizations: No    Attends Archivist Meetings: Never    Marital Status: Married  Human resources officer Violence: Not At Risk (03/31/2018)   Humiliation, Afraid, Rape, and Kick questionnaire    Fear of Current or Ex-Partner: No    Emotionally Abused: No    Physically Abused: No    Sexually Abused: No     PHYSICAL EXAM: Vitals:   03/19/22 1440  BP: (!) 143/74  Pulse: 78  SpO2: 97%   General: No acute distress Head:  Normocephalic/atraumatic Skin/Extremities: No rash, no edema Neurological Exam: alert and awake. No aphasia or dysarthria. Fund of knowledge is appropriate.  Recent and remote memory are intact.  Attention and concentration are normal.   Cranial nerves: Pupils equal, round. Extraocular movements intact with no nystagmus. Visual fields full.  No facial asymmetry.  Motor: Bulk and tone normal, muscle strength 5/5 throughout with no pronator drift.   Finger to nose testing intact.  Gait narrow-based and steady, able to tandem walk adequately.  Romberg negative.   IMPRESSION: This is an 81 yo RH woman with a history  of hypertension, hyperlipidemia, diabetes, neuropathy, left carotid stenosis, who presented in 02/2021 for second opinion regarding seizures. She has had recurrent intermittent episodes of paresthesias on one side of her body, initially reporting symptoms on the right side, but other times on the left. Her routine EEGs and her 24-hour EEG in 03/2021 showed occasional focal slowing  over the left temporal region, no epileptiform discharges seen. Repeat brain MRIs have not shown any acute changes, no structural abnormality on the left temporal region. We had discussed that symptoms are atypical for seizure, she has weaned off Levetiracetam and reports doing well. She has had some episodes of sensations around her mouth/face that occur with BP changes, improving when BP normalizes. Continue BP control, control of vascular risk factors. Follow-up as needed, call for any changes.    Thank you for allowing me to participate in her care.  Please do not hesitate to call for any questions or concerns.   Ellouise Newer, M.D.   CC: Dr. Delfina Redwood

## 2022-03-19 NOTE — Patient Instructions (Signed)
Good to see you doing well! Continue control of blood pressure, cholesterol, sugar levels. Follow-up as needed, call for any changes.

## 2022-03-25 ENCOUNTER — Telehealth: Payer: Self-pay | Admitting: Podiatry

## 2022-03-25 NOTE — Telephone Encounter (Signed)
Spoke w/ pt about changing her sched due to [facility] being out of the office, stated she will cb. She had to look at appts already sched.

## 2022-03-26 ENCOUNTER — Ambulatory Visit
Admission: RE | Admit: 2022-03-26 | Discharge: 2022-03-26 | Disposition: A | Discharge status: Home / Self Care | Payer: Medicare Other | Source: Ambulatory Visit | Attending: Internal Medicine | Admitting: Internal Medicine

## 2022-03-26 ENCOUNTER — Other Ambulatory Visit: Payer: Self-pay | Admitting: Cardiology

## 2022-03-26 DIAGNOSIS — E2839 Other primary ovarian failure: Secondary | ICD-10-CM

## 2022-03-26 DIAGNOSIS — M8589 Other specified disorders of bone density and structure, multiple sites: Secondary | ICD-10-CM | POA: Diagnosis not present

## 2022-03-26 DIAGNOSIS — Z780 Asymptomatic menopausal state: Secondary | ICD-10-CM | POA: Diagnosis not present

## 2022-03-26 DIAGNOSIS — H6123 Impacted cerumen, bilateral: Secondary | ICD-10-CM | POA: Diagnosis not present

## 2022-03-26 DIAGNOSIS — Z9622 Myringotomy tube(s) status: Secondary | ICD-10-CM | POA: Diagnosis not present

## 2022-03-28 ENCOUNTER — Ambulatory Visit: Payer: Medicare Other | Admitting: Neurology

## 2022-03-28 DIAGNOSIS — E1169 Type 2 diabetes mellitus with other specified complication: Secondary | ICD-10-CM | POA: Diagnosis not present

## 2022-03-28 DIAGNOSIS — E7800 Pure hypercholesterolemia, unspecified: Secondary | ICD-10-CM | POA: Diagnosis not present

## 2022-03-28 DIAGNOSIS — I700 Atherosclerosis of aorta: Secondary | ICD-10-CM | POA: Diagnosis not present

## 2022-03-28 DIAGNOSIS — I10 Essential (primary) hypertension: Secondary | ICD-10-CM | POA: Diagnosis not present

## 2022-03-28 DIAGNOSIS — M8580 Other specified disorders of bone density and structure, unspecified site: Secondary | ICD-10-CM | POA: Diagnosis not present

## 2022-03-28 DIAGNOSIS — F418 Other specified anxiety disorders: Secondary | ICD-10-CM | POA: Diagnosis not present

## 2022-03-28 DIAGNOSIS — I8393 Asymptomatic varicose veins of bilateral lower extremities: Secondary | ICD-10-CM | POA: Diagnosis not present

## 2022-04-01 ENCOUNTER — Ambulatory Visit: Payer: Medicare Other | Admitting: Neurology

## 2022-04-02 ENCOUNTER — Ambulatory Visit: Payer: Medicare Other | Admitting: Podiatry

## 2022-04-08 ENCOUNTER — Ambulatory Visit (INDEPENDENT_AMBULATORY_CARE_PROVIDER_SITE_OTHER): Payer: Medicare Other | Admitting: Podiatry

## 2022-04-08 DIAGNOSIS — L84 Corns and callosities: Secondary | ICD-10-CM

## 2022-04-08 DIAGNOSIS — E1142 Type 2 diabetes mellitus with diabetic polyneuropathy: Secondary | ICD-10-CM

## 2022-04-08 DIAGNOSIS — B351 Tinea unguium: Secondary | ICD-10-CM

## 2022-04-08 DIAGNOSIS — M79674 Pain in right toe(s): Secondary | ICD-10-CM | POA: Diagnosis not present

## 2022-04-08 DIAGNOSIS — M79675 Pain in left toe(s): Secondary | ICD-10-CM | POA: Diagnosis not present

## 2022-04-08 NOTE — Progress Notes (Unsigned)
  Subjective:  Patient ID: Sara Brown, female    DOB: 1940-09-15,  MRN: [id number]  Sara Brown presents to clinic today for at risk diabetic footcare with h/o diabetic neuropathy. Chief Complaint  Patient presents with   Nail Problem    Dfc BG - pt states she does not check her sugar  A1C - pt does not recall  PCP - Dr Delfina Redwood , last OV 03/28/22   New problem(s): None.   PCP is Seward Carol, MD.  Allergies  Allergen Reactions   Amlodipine Besylate     Other reaction(s): rash and hives   Hydralazine     Other reaction(s): rash   Losartan Potassium     Other reaction(s): rash and HIves   Naproxen Other (See Comments)    Patient preference  Other reaction(s): upset stomach   Prednisone Other (See Comments)    Patient Preference  Other reaction(s): upset stomach   Amlodipine Hives   Losartan Rash    Review of Systems: Negative except as noted in the HPI.  Objective: No changes noted in today's physical examination.  Sara Brown is a pleasant 81 y.o. female WD, WN in NAD. AAO x 3.  Vascular Examination: CFT immediate b/l LE. Palpable DP/PT pulses b/l LE. Digital hair absent b/l. Skin temperature gradient WNL b/l. No pain with calf compression b/l. No edema noted b/l. No cyanosis or clubbing noted b/l LE.  Dermatological Examination: Pedal integument with normal turgor, texture and tone b/l LE. No open wounds b/l. No interdigital macerations b/l.   Toenails 1-5 b/l elongated, thickened, discolored with subungual debris. +Tenderness with dorsal palpation of nailplates.   Hyperkeratotic lesion(s) noted submet head 1 right foot.  Musculoskeletal Examination: Normal muscle strength 5/5 to all lower extremity muscle groups bilaterally. Hammertoe deformity noted 2-5 b/l.Marland Kitchen No pain, crepitus or joint limitation noted with ROM b/l LE.  Patient ambulates independently without assistive aids.  Neurological Examination: Protective sensation  decreased with 10 gram monofilament b/l. Vibratory sensation intact b/l. Proprioception intact bilaterally.  Assessment/Plan: 1. Pain due to onychomycosis of toenails of both feet   2. Callus   3. Diabetic peripheral neuropathy associated with type 2 diabetes mellitus ([place])     No orders of the defined types were placed in this encounter.   -Patient was evaluated and treated. All patient's and/or POA's questions/concerns answered on today's visit. -Continue diabetic foot care principles: inspect feet daily, monitor glucose as recommended by PCP and/or Endocrinologist, and follow prescribed diet per PCP, Endocrinologist and/or dietician. -Toenails 1-5 b/l were debrided in length and girth with sterile nail nippers and dremel without iatrogenic bleeding.  -Callus(es) submet head 1 right foot pared utilizing sharp debridement with sterile blade without complication or incident. Total number debrided =1. -Patient/POA to call should there be question/concern in the interim.   Return in about 3 months (around 07/09/2022).  Marzetta Board, DPM

## 2022-04-10 ENCOUNTER — Encounter: Payer: Self-pay | Admitting: Podiatry

## 2022-04-25 ENCOUNTER — Other Ambulatory Visit: Payer: Self-pay | Admitting: Cardiology

## 2022-04-25 DIAGNOSIS — E782 Mixed hyperlipidemia: Secondary | ICD-10-CM

## 2022-04-25 DIAGNOSIS — I2510 Atherosclerotic heart disease of native coronary artery without angina pectoris: Secondary | ICD-10-CM

## 2022-05-16 DIAGNOSIS — H40023 Open angle with borderline findings, high risk, bilateral: Secondary | ICD-10-CM | POA: Diagnosis not present

## 2022-05-16 DIAGNOSIS — H04123 Dry eye syndrome of bilateral lacrimal glands: Secondary | ICD-10-CM | POA: Diagnosis not present

## 2022-05-17 ENCOUNTER — Telehealth: Payer: Self-pay

## 2022-05-17 NOTE — Telephone Encounter (Signed)
Patients husband called and wants to know if patient can take BONE-UP OTC supplement, because he read on the warning label says "If on blood-thinners, ask your doctor first". Please advise.    [email or web address]

## 2022-05-17 NOTE — Telephone Encounter (Signed)
Patient is aware 

## 2022-05-17 NOTE — Telephone Encounter (Signed)
Blood thinner in this case most likely means warfarin. Okay for the patient to take it.  Thanks MJP

## 2022-05-21 DIAGNOSIS — E7800 Pure hypercholesterolemia, unspecified: Secondary | ICD-10-CM | POA: Diagnosis not present

## 2022-05-21 DIAGNOSIS — I2510 Atherosclerotic heart disease of native coronary artery without angina pectoris: Secondary | ICD-10-CM | POA: Diagnosis not present

## 2022-05-21 DIAGNOSIS — I10 Essential (primary) hypertension: Secondary | ICD-10-CM | POA: Diagnosis not present

## 2022-05-21 DIAGNOSIS — E1165 Type 2 diabetes mellitus with hyperglycemia: Secondary | ICD-10-CM | POA: Diagnosis not present

## 2022-05-21 DIAGNOSIS — Z23 Encounter for immunization: Secondary | ICD-10-CM | POA: Diagnosis not present

## 2022-05-21 DIAGNOSIS — M792 Neuralgia and neuritis, unspecified: Secondary | ICD-10-CM | POA: Diagnosis not present

## 2022-05-22 DIAGNOSIS — E1165 Type 2 diabetes mellitus with hyperglycemia: Secondary | ICD-10-CM | POA: Diagnosis not present

## 2022-05-22 DIAGNOSIS — M792 Neuralgia and neuritis, unspecified: Secondary | ICD-10-CM | POA: Diagnosis not present

## 2022-07-10 ENCOUNTER — Ambulatory Visit: Payer: Medicare Other | Admitting: Cardiology

## 2022-07-10 ENCOUNTER — Encounter: Payer: Self-pay | Admitting: Cardiology

## 2022-07-10 VITALS — BP 154/75 | HR 75 | Resp 16 | Ht 66.0 in | Wt 131.0 lb

## 2022-07-10 DIAGNOSIS — I2510 Atherosclerotic heart disease of native coronary artery without angina pectoris: Secondary | ICD-10-CM

## 2022-07-10 MED ORDER — NIFEDIPINE ER 60 MG PO TB24: 60.0000 mg | ORAL_TABLET | Freq: Every day | ORAL | 3 refills | Status: DC

## 2022-07-10 NOTE — Progress Notes (Signed)
Follow up visit  Subjective:   Sara Brown, female    DOB: 05/11/1941, 82 y.o.   MRN: [id number]   Chief Complaint  Patient presents with   Hypertension   Follow-up    6 month   Coronary Artery Disease     82 y/o African-American female with hypertension, diet controlled diabetes, hyperlipidemia,  GERD and remote history of TIA, mild nonobstructive coronary artery disease (Cath 04/01/2018).  Blood pressure is elevated today. She forgot to take her medication last night.   . Current Outpatient Medications:    albuterol (PROVENTIL HFA;VENTOLIN HFA) 108 (90 Base) MCG/ACT inhaler, Inhale 1 puff into the lungs every 4 (four) hours as needed for wheezing., Disp: , Rfl:    alendronate (FOSAMAX) 70 MG tablet, Take 70 mg by mouth every Saturday., Disp: , Rfl:    aspirin EC 81 MG tablet, Take 1 tablet (81 mg total) by mouth daily. Swallow whole., Disp: 90 tablet, Rfl: 3   Calcium Citrate-Vitamin D 315-250 MG-UNIT TABS, Take 2 tablets by mouth daily., Disp: , Rfl:    carboxymethylcellulose (REFRESH PLUS) 0.5 % SOLN, Place 1 drop into both eyes in the morning and at bedtime., Disp: , Rfl:    Cholecalciferol (VITAMIN D3) 10 MCG (400 UNIT) CAPS, Take 800 Units by mouth daily., Disp: , Rfl:    clobetasol cream (TEMOVATE) 0.05 %, APPLY TO AFFECTED AREA DAILY, Disp: , Rfl:    Clobetasol Prop Emollient Base (CLOBETASOL PROPIONATE E) 0.05 % emollient cream, Apply 1 application topically 2 (two) times daily as needed., Disp: 60 g, Rfl: 6   cloNIDine (CATAPRES) 0.1 MG tablet, TAKE 1 TABLET BY MOUTH AT BEDTIME , MAY TAKE ADDITIONAL 1 TAB IF BLOOD PRESSURE IS HIGH, Disp: 180 tablet, Rfl: 1   Coenzyme Q10 (COQ10) 200 MG CAPS, Take 200 mg by mouth daily., Disp: , Rfl:    cycloSPORINE (RESTASIS) 0.05 % ophthalmic emulsion, Place 1 drop into both eyes 2 (two) times daily. , Disp: , Rfl:    fluticasone (FLONASE) 50 MCG/ACT nasal spray, Place 1 spray into both nostrils in the morning and at  bedtime., Disp: , Rfl:    Fluticasone-Salmeterol (WIXELA INHUB IN), Inhale into the lungs., Disp: , Rfl:    Garlic 3810 MG CAPS, Take 1,000 mg by mouth daily as needed (high blood pressure)., Disp: , Rfl:    Magnesium 250 MG TABS, Take 250 mg by mouth daily., Disp: , Rfl:    Multiple Vitamins-Minerals (CENTRUM SILVER) CHEW, See admin instructions., Disp: , Rfl:    NIFEdipine (PROCARDIA-XL/NIFEDICAL-XL) 30 MG 24 hr tablet, TAKE 1 TABLET BY MOUTH EVERY DAY, Disp: 90 tablet, Rfl: 1   NON FORMULARY, Garlique 1800, Disp: , Rfl:    Pitavastatin Calcium (LIVALO) 2 MG TABS, TAKE 1 TABLET BY MOUTH EVERY DAY, Disp: 336 tablet, Rfl: 1   polyethylene glycol (MIRALAX / GLYCOLAX) packet, Take 17 g by mouth daily as needed for moderate constipation., Disp: , Rfl:    pyridOXINE (VITAMIN B-6) 100 MG tablet, Take 100 mg by mouth daily. , Disp: , Rfl:    spironolactone (ALDACTONE) 50 MG tablet, Take 50 mg by mouth daily. , Disp: , Rfl:   Cardiovascular & other pertient studies:  EKG 01/07/2022: Sinus rhythm 61 bpm  Low voltage Nonspecific T-abnormality  Renal artery duplex  12/14/2020:  No evidence of renal artery occlusive disease in either renal artery.  Normal intrarenal vascular perfusion is noted in both kidneys.  Renal length is within normal limits for both kidneys.  Diffuse plaque observed in the abdominal aorta. Normal abdominal aorta  flow velocities noted.  Coronary angiogram 04/01/2018: LM: Normal LAD: Prox-mid 40% stenosis. iFR 0.99 (Physiologically nonsignificant) LCx: Dominant. Small branches with mild disease RCA: Nondominant. Normal   LVEDP mildly elevated.   Carotid artery duplex 02/23/2018: No hemodynamically significant arterial disease in the internal carotid artery bilaterally. No significant plaque burden. Antegrade right vertebral artery flow. Antegrade left vertebral artery flow.  Echocardiogram 10/29/2017: Left ventricle cavity is normal in size. Mild concentric hypertrophy  of the left ventricle. Normal global wall motion. Normal diastolic filling pattern. Calculated EF 69%. No significant valvular abnormality.  Insignificant pericardial effusion.  Recent labs: 11/19/2021: Glucose 77, BUN/Cr 19/0.91. EGFR 63. Na/K 140/4.2. Rest of the CMP normal H/H 12/37. MCV 88. Platelets 212 HbA1C 6.2% Chol 172, TG 57, HDL 65, LDL 96 TSH 2.0 normal  Review of Systems  Cardiovascular:  Negative for chest pain, dyspnea on exertion, leg swelling, palpitations and syncope.  Neurological:  Negative for headaches and paresthesias.       Vitals:   07/10/22 0923  BP: (!) 154/75  Pulse: 75  Resp: 16  SpO2: 97%      Body mass index is 21.14 kg/m. Filed Weights   07/10/22 0923  Weight: 131 lb (59.4 kg)     Objective:   Physical Exam Vitals and nursing note reviewed.  Constitutional:      Appearance: She is well-developed.  Neck:     Vascular: No JVD.  Cardiovascular:     Rate and Rhythm: Normal rate and regular rhythm.     Pulses: Intact distal pulses.     Heart sounds: Normal heart sounds. No murmur heard. Pulmonary:     Effort: Pulmonary effort is normal.     Breath sounds: Normal breath sounds. No wheezing or rales.  Musculoskeletal:     Right lower leg: No edema.     Left lower leg: No edema.        Assessment & Recommendations:   82 y/o African-American female with hypertension, diet controlled diabetes, hyperlipidemia,  GERD and remote history of TIA, mild nonobstructive coronary artery disease (Cath 04/01/2018).  Hypertension: Intolerant to several medications. Increase Nifedipine to 60 mg daily. Continue spironolactone 50 mg daily, clonidine 0.1 mg at bedtime with additional 1/2-1 pill as needed .  Coronary artery disease involving native coronary artery of native heart without angina pectoris Continue risk factor modification with BP and lipid lowering therapy. EKG shows no ischemia.  Continue aspirin 81 mg daily (does not need to  take plavix), pitvastatin. She has not tolerated other statins in the past. She does not want to try injectables.  F/u in 4 weeks    Nigel Mormon, MD Pager: [phone] Office: [phone]

## 2022-07-31 ENCOUNTER — Ambulatory Visit (INDEPENDENT_AMBULATORY_CARE_PROVIDER_SITE_OTHER): Payer: Medicare Other | Admitting: Podiatry

## 2022-07-31 ENCOUNTER — Encounter: Payer: Self-pay | Admitting: Podiatry

## 2022-07-31 VITALS — BP 135/76

## 2022-07-31 DIAGNOSIS — B351 Tinea unguium: Secondary | ICD-10-CM

## 2022-07-31 DIAGNOSIS — E119 Type 2 diabetes mellitus without complications: Secondary | ICD-10-CM | POA: Diagnosis not present

## 2022-07-31 DIAGNOSIS — M2041 Other hammer toe(s) (acquired), right foot: Secondary | ICD-10-CM

## 2022-07-31 DIAGNOSIS — L84 Corns and callosities: Secondary | ICD-10-CM | POA: Diagnosis not present

## 2022-07-31 DIAGNOSIS — M2042 Other hammer toe(s) (acquired), left foot: Secondary | ICD-10-CM | POA: Diagnosis not present

## 2022-07-31 DIAGNOSIS — E1142 Type 2 diabetes mellitus with diabetic polyneuropathy: Secondary | ICD-10-CM

## 2022-07-31 DIAGNOSIS — M79675 Pain in left toe(s): Secondary | ICD-10-CM | POA: Diagnosis not present

## 2022-07-31 DIAGNOSIS — M79674 Pain in right toe(s): Secondary | ICD-10-CM | POA: Diagnosis not present

## 2022-07-31 NOTE — Patient Instructions (Signed)
Purchase Nervive Pain Cream or Roll On. Apply to feet before bedtime. Can be purchased at your local drug store over the counter.

## 2022-08-01 ENCOUNTER — Ambulatory Visit: Payer: Medicare Other | Admitting: Cardiology

## 2022-08-01 NOTE — Progress Notes (Signed)
ANNUAL DIABETIC FOOT EXAM  Subjective: Jamea Lat presents today for annual diabetic foot examination.  Chief Complaint  Patient presents with   Nail Problem    DFC BS-do not check A1C-do not remember PCP-Polite PCP VST-2023   Patient confirms h/o diabetes.  Patient relates 20 year h/o diabetes.  Patient denies any h/o foot wounds.  Patient endorses symptoms of sharp shooting pain on occasion.  Risk factors: diabetes, h/o TIA, HTN, CAD, hyperlipidemia, hypercholesterolemia.  Seward Carol, MD is patient's PCP.   Past Medical History:  Diagnosis Date   Age-related osteoporosis without current pathological fracture 08/18/2020   Allergic rhinitis    Angina at rest 02/09/2018   Arthritis    "fingers, knees" (02/09/2018)   Asthma    "stopped taking RX after dr said I don't have this" (02/09/2018)   Basal cell carcinoma    Bradycardia 02/09/2018   Carotid stenosis    ICA(L)   Chest pain due to coronary artery disease 02/09/2018   Chronic constipation [id number]   Complication of anesthesia    "felt the cut w/one of my c-sections" (02/09/2018)   Coronary artery disease involving native coronary artery of native heart without angina pectoris 09/17/2018   Diabetes mellitus type 2, controlled 03/31/2018   EEG abnormal    Essential hypertension 03/31/2018   Generalized anxiety disorder    GERD (gastroesophageal reflux disease)    Hardening of the aorta (main artery of the heart)    Headache    "years ago; before menopause" (02/09/2018)   Hirsutism 06/12/2017   Hyperglycemia due to type 2 diabetes mellitus 03/09/2021   Hyperlipidemia    Hyperpigmentation 02/13/2017   Hypertensive retinopathy    Irritable bowel syndrome    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   Mixed hyperlipidemia 12/29/2020   Myringotomy tube status 04/03/2021   Neck mass  01/17/2017   Neuropathic pain 03/09/2021   OA (osteoarthritis)    Osteoporosis    Palpitations 03/16/2021   Paresthesias 09/05/2020   Personal history of colonic polyps    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   Pure hypercholesterolemia 08/18/2020   Shortness of breath 07/30/2007   Skin laxity 06/12/2017   TIA (transient ischemic attack) ?2005   Vitamin D deficiency    Patient Active Problem List   Diagnosis Date Noted   Precordial pain 01/23/2022   Numbness 01/23/2022   Decreased estrogen level 12/24/2021   Sacroiliitis, not elsewhere classified ([place]) 12/24/2021   Vitamin D deficiency 08/31/2021   Generalized anxiety disorder    EEG abnormal    Hypertensive retinopathy    Personal history of colonic polyps    Irritable bowel syndrome    Hardening of the aorta (main artery of the heart)    Myringotomy tube status 04/03/2021   Palpitations 03/16/2021   Hyperglycemia due to type 2 diabetes mellitus 03/09/2021   Neuropathic pain 03/09/2021   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   Mixed hyperlipidemia 12/29/2020   TIA (transient ischemic attack) 11/13/2020   Paresthesias 09/05/2020   Age-related osteoporosis without current pathological fracture 08/18/2020   Pure hypercholesterolemia 08/18/2020   Chronic constipation 08/18/2020   Coronary artery disease involving native coronary artery of native heart without angina pectoris 09/17/2018   Diabetes mellitus type 2, controlled 03/31/2018   Essential hypertension 03/31/2018  Chest pain due to coronary artery disease ([place]) 02/09/2018   Angina at rest 02/09/2018   Bradycardia 02/09/2018   Skin laxity 06/12/2017   Hirsutism 06/12/2017   Hyperpigmentation 02/13/2017   Neck mass 01/17/2017   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   GERD (gastroesophageal reflux disease)    Shortness of breath 07/30/2007   Past  Surgical History:  Procedure Laterality Date   CATARACT EXTRACTION W/ INTRAOCULAR LENS  IMPLANT, BILATERAL Bilateral    CESAREAN SECTION  1971; 1975   INTRAVASCULAR PRESSURE WIRE/FFR STUDY N/A 04/01/2018   Procedure: INTRAVASCULAR PRESSURE WIRE/FFR STUDY;  Surgeon: Nigel Mormon, MD;  Location: [facility];  Service: Cardiovascular;  Laterality: N/A;   LEFT HEART CATH AND CORONARY ANGIOGRAPHY N/A 04/01/2018   Procedure: LEFT HEART CATH AND CORONARY ANGIOGRAPHY;  Surgeon: Nigel Mormon, MD;  Location: [facility];  Service: Cardiovascular;  Laterality: N/A;   TUBAL LIGATION  1975   TYMPANOSTOMY TUBE PLACEMENT Left 03/01/2021   Current Outpatient Medications on File Prior to Visit  Medication Sig Dispense Refill   albuterol (PROVENTIL HFA;VENTOLIN HFA) 108 (90 Base) MCG/ACT inhaler Inhale 1 puff into the lungs every 4 (four) hours as needed for wheezing.     alendronate (FOSAMAX) 70 MG tablet Take 70 mg by mouth every Saturday.     aspirin EC 81 MG tablet Take 1 tablet (81 mg total) by mouth daily. Swallow whole. 90 tablet 3   Calcium Citrate-Vitamin D 315-250 MG-UNIT TABS Take 2 tablets by mouth daily.     carboxymethylcellulose (REFRESH PLUS) 0.5 % SOLN Place 1 drop into both eyes in the morning and at bedtime.     Cholecalciferol (VITAMIN D3) 10 MCG (400 UNIT) CAPS Take 800 Units by mouth daily.     clobetasol cream (TEMOVATE) 0.05 % APPLY TO AFFECTED AREA DAILY     Clobetasol Prop Emollient Base (CLOBETASOL PROPIONATE E) 0.05 % emollient cream Apply 1 application topically 2 (two) times daily as needed. 60 g 6   cloNIDine (CATAPRES) 0.1 MG tablet TAKE 1 TABLET BY MOUTH AT BEDTIME , MAY TAKE ADDITIONAL 1 TAB IF BLOOD PRESSURE IS HIGH 180 tablet 1   Coenzyme Q10 (COQ10) 200 MG CAPS Take 200 mg by mouth daily.     cycloSPORINE (RESTASIS) 0.05 % ophthalmic emulsion Place 1 drop into both eyes 2 (two) times daily.      fluticasone (FLONASE) 50 MCG/ACT nasal spray  Place 1 spray into both nostrils in the morning and at bedtime.     Fluticasone-Salmeterol ([place]) Inhale into the lungs.     Garlic 123XX123 MG CAPS Take 1,000 mg by mouth daily as needed (high blood pressure).     Magnesium 250 MG TABS Take 250 mg by mouth daily.     Multiple Vitamins-Minerals (CENTRUM SILVER) CHEW See admin instructions.     NIFEdipine (ADALAT CC) 60 MG 24 hr tablet Take 1 tablet (60 mg total) by mouth daily. 90 tablet 3   NON FORMULARY Garlique 1800     Pitavastatin Calcium (LIVALO) 2 MG TABS TAKE 1 TABLET BY MOUTH EVERY DAY 336 tablet 1   polyethylene glycol (MIRALAX / GLYCOLAX) packet Take 17 g by mouth daily as needed for moderate constipation.     pyridOXINE (VITAMIN B-6) 100 MG tablet Take 100 mg by mouth daily.      spironolactone (ALDACTONE) 50 MG tablet Take 50 mg by mouth daily.      No current facility-administered medications on file prior  to visit.    Allergies  Allergen Reactions   Amlodipine Besylate     Other reaction(s): rash and hives   Hydralazine     Other reaction(s): rash   Losartan Potassium     Other reaction(s): rash and HIves   Naproxen Other (See Comments)    Patient preference  Other reaction(s): upset stomach   Prednisone Other (See Comments)    Patient Preference  Other reaction(s): upset stomach   Amlodipine Hives   Losartan Rash   Social History   Occupational History   Occupation: Retired  Tobacco Use   Smoking status: Never   Smokeless tobacco: Never  Vaping Use   Vaping Use: Never used  Substance and Sexual Activity   Alcohol use: Never   Drug use: Never   Sexual activity: Not Currently   Family History  Problem Relation Age of Onset   Diabetes Mellitus II Mother    Osteoporosis Mother    Hypertension Mother    Diabetes Mellitus II Father    CVA Sister    CAD Sister    Hypertension Sister    Osteoarthritis Sister    Alcoholism Brother    Epilepsy Brother    Breast cancer Cousin 54   Dementia Cousin         suspected; never confirmed   Immunization History  Administered Date(s) Administered   Tdap 07/10/2013     Review of Systems: Negative except as noted in the HPI.   Objective: Vitals:   07/31/22 0903  BP: 135/76    Sara Brown is a pleasant 82 y.o. female in NAD. AAO X 3.  Vascular Examination: Capillary refill time immediate b/l. Vascular status intact b/l with palpable pedal pulses. Pedal hair sparse b/l. No edema. No pain with calf compression b/l. Skin temperature gradient WNL b/l. No cyanosis or clubbing noted b/l LE.  Neurological Examination: Sensation grossly intact b/l with 10 gram monofilament. Vibratory sensation intact b/l. Pt has subjective symptoms of neuropathy.  Dermatological Examination: Pedal skin with normal turgor, texture and tone b/l.  No open wounds. No interdigital macerations.   Toenails 1-5 b/l thick, discolored, elongated with subungual debris and pain on dorsal palpation.   Pedal integument with normal turgor, texture and tone BLE. No open wounds b/l LE. No interdigital macerations noted b/l LE. Toenails 1-5 b/l elongated, discolored, dystrophic, thickened, crumbly with subungual debris and tenderness to dorsal palpation. Hyperkeratotic lesion(s) submet head 1 right foot.  No erythema, no edema, no drainage, no fluctuance.  Musculoskeletal Examination: Muscle strength 5/5 to all lower extremity muscle groups bilaterally. HAV with bunion deformity noted b/l LE. Hammertoe deformity noted 2-5 b/l. Patient ambulates independent of any assistive aids.  Radiographs: None  Footwear Assessment: Does the patient wear appropriate shoes? Yes. Does the patient need inserts/orthotics? Yes.  Lab Results  Component Value Date   HGBA1C 5.9 (H) 11/14/2020   No results found. ADA Risk Categorization: Low Risk :  Patient has all of the following: Intact protective sensation No prior foot ulcer  No severe deformity Pedal pulses  present  Assessment: 1. Pain due to onychomycosis of toenails of both feet   2. Callus   3. Hammer toes of both feet   4. Diabetic peripheral neuropathy associated with type 2 diabetes mellitus ([place])   5. Encounter for diabetic foot exam ([place])     Plan: Orders Placed This Encounter  Procedures   For Home Use Only DME Diabetic Shoe    Dispense one pair of extra  depth shoes and 3 pair total contact insoles. Offload callus submet head 1 right foot.   FOR HOME USE ONLY DME DIABETIC SHOE  -Patient was evaluated and treated. All patient's and/or POA's questions/concerns answered on today's visit. -Discussed neuropathy symptoms. Advised patient/POA to purchase Nervive Pain Cream or Roll On. Apply to foot/feet before bedtime. -Diabetic foot examination performed today. -Continue diabetic foot care principles: inspect feet daily, monitor glucose as recommended by PCP and/or Endocrinologist, and follow prescribed diet per PCP, Endocrinologist and/or dietician. -Continue supportive shoe gear daily. -Discussed diabetic shoe benefit available based on patient's diagnoses. Patient/POA would like to proceed. Order entered for one pair extra depth shoes and 3 pair total contact insoles. Patient qualifies based on diagnoses. -Toenails 1-5 b/l were debrided in length and girth with sterile nail nippers and dremel without iatrogenic bleeding.  -Callus(es) submet head 1 right foot pared utilizing sterile scalpel blade without complication or incident. Total number debrided =1. -Patient/POA to call should there be question/concern in the interim. Return in about 3 months (around 10/29/2022).  Marzetta Board, DPM

## 2022-08-02 DIAGNOSIS — E1169 Type 2 diabetes mellitus with other specified complication: Secondary | ICD-10-CM | POA: Diagnosis not present

## 2022-08-02 DIAGNOSIS — R350 Frequency of micturition: Secondary | ICD-10-CM | POA: Diagnosis not present

## 2022-08-02 DIAGNOSIS — R519 Headache, unspecified: Secondary | ICD-10-CM | POA: Diagnosis not present

## 2022-08-02 DIAGNOSIS — R1030 Lower abdominal pain, unspecified: Secondary | ICD-10-CM | POA: Diagnosis not present

## 2022-08-02 DIAGNOSIS — I10 Essential (primary) hypertension: Secondary | ICD-10-CM | POA: Diagnosis not present

## 2022-08-02 DIAGNOSIS — K5900 Constipation, unspecified: Secondary | ICD-10-CM | POA: Diagnosis not present

## 2022-08-05 DIAGNOSIS — E7800 Pure hypercholesterolemia, unspecified: Secondary | ICD-10-CM | POA: Diagnosis not present

## 2022-08-05 DIAGNOSIS — I2510 Atherosclerotic heart disease of native coronary artery without angina pectoris: Secondary | ICD-10-CM | POA: Diagnosis not present

## 2022-08-05 DIAGNOSIS — E1165 Type 2 diabetes mellitus with hyperglycemia: Secondary | ICD-10-CM | POA: Diagnosis not present

## 2022-08-05 DIAGNOSIS — M8580 Other specified disorders of bone density and structure, unspecified site: Secondary | ICD-10-CM | POA: Diagnosis not present

## 2022-08-05 DIAGNOSIS — I10 Essential (primary) hypertension: Secondary | ICD-10-CM | POA: Diagnosis not present

## 2022-08-05 DIAGNOSIS — M792 Neuralgia and neuritis, unspecified: Secondary | ICD-10-CM | POA: Diagnosis not present

## 2022-08-06 DIAGNOSIS — T887XXA Unspecified adverse effect of drug or medicament, initial encounter: Secondary | ICD-10-CM | POA: Diagnosis not present

## 2022-08-07 ENCOUNTER — Ambulatory Visit: Payer: Medicare Other | Admitting: Cardiology

## 2022-08-07 ENCOUNTER — Encounter: Payer: Self-pay | Admitting: Cardiology

## 2022-08-07 VITALS — BP 145/79 | HR 76 | Resp 16 | Ht 66.0 in | Wt 128.8 lb

## 2022-08-07 DIAGNOSIS — I2510 Atherosclerotic heart disease of native coronary artery without angina pectoris: Secondary | ICD-10-CM | POA: Diagnosis not present

## 2022-08-07 DIAGNOSIS — E782 Mixed hyperlipidemia: Secondary | ICD-10-CM | POA: Diagnosis not present

## 2022-08-07 DIAGNOSIS — I10 Essential (primary) hypertension: Secondary | ICD-10-CM

## 2022-08-07 NOTE — Progress Notes (Signed)
Follow up visit  Subjective:   Sara Brown, female    DOB: 1941-05-09, 82 y.o.   MRN: [id number]   Chief Complaint  Patient presents with   Coronary Artery Disease   Hypertension   Follow-up    4 weeks     82 y/o African-American female with hypertension, diet controlled diabetes, hyperlipidemia,  GERD and remote history of TIA, mild nonobstructive coronary artery disease (Cath 04/01/2018).  Blood pressure is elevated today. She forgot to take her medication last night.   . Current Outpatient Medications:    albuterol (PROVENTIL HFA;VENTOLIN HFA) 108 (90 Base) MCG/ACT inhaler, Inhale 1 puff into the lungs every 4 (four) hours as needed for wheezing., Disp: , Rfl:    alendronate (FOSAMAX) 70 MG tablet, Take 70 mg by mouth every Saturday., Disp: , Rfl:    aspirin EC 81 MG tablet, Take 1 tablet (81 mg total) by mouth daily. Swallow whole., Disp: 90 tablet, Rfl: 3   Calcium Citrate-Vitamin D 315-250 MG-UNIT TABS, Take 2 tablets by mouth daily., Disp: , Rfl:    carboxymethylcellulose (REFRESH PLUS) 0.5 % SOLN, Place 1 drop into both eyes in the morning and at bedtime., Disp: , Rfl:    Cholecalciferol (VITAMIN D3) 10 MCG (400 UNIT) CAPS, Take 800 Units by mouth daily., Disp: , Rfl:    clobetasol cream (TEMOVATE) 0.05 %, APPLY TO AFFECTED AREA DAILY, Disp: , Rfl:    Clobetasol Prop Emollient Base (CLOBETASOL PROPIONATE E) 0.05 % emollient cream, Apply 1 application topically 2 (two) times daily as needed., Disp: 60 g, Rfl: 6   cloNIDine (CATAPRES) 0.1 MG tablet, TAKE 1 TABLET BY MOUTH AT BEDTIME , MAY TAKE ADDITIONAL 1 TAB IF BLOOD PRESSURE IS HIGH, Disp: 180 tablet, Rfl: 1   Coenzyme Q10 (COQ10) 200 MG CAPS, Take 200 mg by mouth daily., Disp: , Rfl:    cycloSPORINE (RESTASIS) 0.05 % ophthalmic emulsion, Place 1 drop into both eyes 2 (two) times daily. , Disp: , Rfl:    fluticasone (FLONASE) 50 MCG/ACT nasal spray, Place 1 spray into both nostrils in the morning and at  bedtime., Disp: , Rfl:    Fluticasone-Salmeterol (WIXELA INHUB IN), Inhale into the lungs., Disp: , Rfl:    Garlic 123XX123 MG CAPS, Take 1,000 mg by mouth daily as needed (high blood pressure)., Disp: , Rfl:    Magnesium 250 MG TABS, Take 250 mg by mouth daily., Disp: , Rfl:    Multiple Vitamins-Minerals (CENTRUM SILVER) CHEW, See admin instructions., Disp: , Rfl:    NIFEdipine (ADALAT CC) 60 MG 24 hr tablet, Take 1 tablet (60 mg total) by mouth daily., Disp: 90 tablet, Rfl: 3   NON FORMULARY, Garlique 1800, Disp: , Rfl:    Pitavastatin Calcium (LIVALO) 2 MG TABS, TAKE 1 TABLET BY MOUTH EVERY DAY, Disp: 336 tablet, Rfl: 1   polyethylene glycol (MIRALAX / GLYCOLAX) packet, Take 17 g by mouth daily as needed for moderate constipation., Disp: , Rfl:    pyridOXINE (VITAMIN B-6) 100 MG tablet, Take 100 mg by mouth daily. , Disp: , Rfl:    spironolactone (ALDACTONE) 50 MG tablet, Take 50 mg by mouth daily. , Disp: , Rfl:   Cardiovascular & other pertient studies:  EKG 01/07/2022: Sinus rhythm 61 bpm  Low voltage Nonspecific T-abnormality  Renal artery duplex  12/14/2020:  No evidence of renal artery occlusive disease in either renal artery.  Normal intrarenal vascular perfusion is noted in both kidneys.  Renal length is within normal limits  for both kidneys.  Diffuse plaque observed in the abdominal aorta. Normal abdominal aorta  flow velocities noted.  Coronary angiogram 04/01/2018: LM: Normal LAD: Prox-mid 40% stenosis. iFR 0.99 (Physiologically nonsignificant) LCx: Dominant. Small branches with mild disease RCA: Nondominant. Normal   LVEDP mildly elevated.   Carotid artery duplex 02/23/2018: No hemodynamically significant arterial disease in the internal carotid artery bilaterally. No significant plaque burden. Antegrade right vertebral artery flow. Antegrade left vertebral artery flow.  Echocardiogram 10/29/2017: Left ventricle cavity is normal in size. Mild concentric hypertrophy of  the left ventricle. Normal global wall motion. Normal diastolic filling pattern. Calculated EF 69%. No significant valvular abnormality.  Insignificant pericardial effusion.  Recent labs: 11/19/2021: Glucose 77, BUN/Cr 19/0.91. EGFR 63. Na/K 140/4.2. Rest of the CMP normal H/H 12/37. MCV 88. Platelets 212 HbA1C 6.2% Chol 172, TG 57, HDL 65, LDL 96 TSH 2.0 normal  Review of Systems  Cardiovascular:  Negative for chest pain, dyspnea on exertion, leg swelling, palpitations and syncope.  Neurological:  Negative for headaches and paresthesias.       Vitals:   08/07/22 0933  BP: (!) 145/79  Pulse: 76  Resp: 16  SpO2: 98%      Body mass index is 20.79 kg/m. Filed Weights   08/07/22 0933  Weight: 128 lb 12.8 oz (58.4 kg)     Objective:   Physical Exam Vitals and nursing note reviewed.  Constitutional:      Appearance: She is well-developed.  Neck:     Vascular: No JVD.  Cardiovascular:     Rate and Rhythm: Normal rate and regular rhythm.     Pulses: Intact distal pulses.     Heart sounds: Normal heart sounds. No murmur heard. Pulmonary:     Effort: Pulmonary effort is normal.     Breath sounds: Normal breath sounds. No wheezing or rales.  Musculoskeletal:     Right lower leg: No edema.     Left lower leg: No edema.        Assessment & Recommendations:   82 y/o African-American female with hypertension, diet controlled diabetes, hyperlipidemia,  GERD and remote history of TIA, mild nonobstructive coronary artery disease (Cath 04/01/2018).  Hypertension: Intolerant to several medications. Continue Nifedipine 60 mg daily, spironolactone 50 mg daily, clonidine 0.1 mg at bedtime with additional 1/2-1 pill as needed . There are variations in blood pressure at home. Arranged for remote patient monitoring through pur pharmacist Haynes Dage.  Coronary artery disease involving native coronary artery of native heart without angina pectoris Continue risk factor  modification with BP and lipid lowering therapy. EKG shows no ischemia.  Continue aspirin 81 mg daily (does not need to take plavix), pitvastatin. She has not tolerated other statins in the past. She does not want to try injectables.  F/u in 3 months   Nigel Mormon, MD Pager: [phone] Office: [phone]

## 2022-08-08 ENCOUNTER — Ambulatory Visit (INDEPENDENT_AMBULATORY_CARE_PROVIDER_SITE_OTHER): Payer: Medicare Other

## 2022-08-08 DIAGNOSIS — E1142 Type 2 diabetes mellitus with diabetic polyneuropathy: Secondary | ICD-10-CM

## 2022-08-08 DIAGNOSIS — M2041 Other hammer toe(s) (acquired), right foot: Secondary | ICD-10-CM

## 2022-08-08 DIAGNOSIS — M2042 Other hammer toe(s) (acquired), left foot: Secondary | ICD-10-CM

## 2022-08-08 NOTE — Progress Notes (Signed)
Patient presents to the office today for diabetic shoe and insole measuring.  Patient was measured with brannock device to determine size and width for 1 pair of extra depth shoes and foam casted for 3 pair of insoles.   ABN signed.   Documentation of medical necessity will be sent to patient's treating diabetic doctor to verify and sign.   Patient's diabetic provider: DR Docia Chuck and insoles will be ordered at that time and patient will be notified for an appointment for fitting when they arrive.   Brannock measurement: 3M  Patient shoe selection-   1st   Shoe choice:   [id number] NEW BALANCE  Shoe size ordered: 3M

## 2022-08-12 ENCOUNTER — Other Ambulatory Visit: Payer: Self-pay | Admitting: Cardiology

## 2022-08-12 DIAGNOSIS — I1A0 Resistant hypertension: Secondary | ICD-10-CM

## 2022-08-15 DIAGNOSIS — R519 Headache, unspecified: Secondary | ICD-10-CM | POA: Diagnosis not present

## 2022-08-15 DIAGNOSIS — R109 Unspecified abdominal pain: Secondary | ICD-10-CM | POA: Diagnosis not present

## 2022-08-16 ENCOUNTER — Emergency Department (HOSPITAL_COMMUNITY): Payer: Medicare Other

## 2022-08-16 ENCOUNTER — Emergency Department (HOSPITAL_COMMUNITY)
Admission: EM | Admit: 2022-08-16 | Discharge: 2022-08-16 | Disposition: A | Discharge status: Home / Self Care | Payer: Medicare Other | Attending: Emergency Medicine | Admitting: Emergency Medicine

## 2022-08-16 ENCOUNTER — Encounter (HOSPITAL_COMMUNITY): Payer: Self-pay | Admitting: *Deleted

## 2022-08-16 ENCOUNTER — Other Ambulatory Visit: Payer: Self-pay

## 2022-08-16 DIAGNOSIS — I10 Essential (primary) hypertension: Secondary | ICD-10-CM | POA: Diagnosis not present

## 2022-08-16 DIAGNOSIS — Z8673 Personal history of transient ischemic attack (TIA), and cerebral infarction without residual deficits: Secondary | ICD-10-CM | POA: Diagnosis not present

## 2022-08-16 DIAGNOSIS — R109 Unspecified abdominal pain: Secondary | ICD-10-CM | POA: Diagnosis not present

## 2022-08-16 DIAGNOSIS — Z7951 Long term (current) use of inhaled steroids: Secondary | ICD-10-CM | POA: Diagnosis not present

## 2022-08-16 DIAGNOSIS — R1084 Generalized abdominal pain: Secondary | ICD-10-CM

## 2022-08-16 DIAGNOSIS — R519 Headache, unspecified: Secondary | ICD-10-CM | POA: Insufficient documentation

## 2022-08-16 DIAGNOSIS — J45909 Unspecified asthma, uncomplicated: Secondary | ICD-10-CM | POA: Diagnosis not present

## 2022-08-16 DIAGNOSIS — K5900 Constipation, unspecified: Secondary | ICD-10-CM | POA: Insufficient documentation

## 2022-08-16 DIAGNOSIS — Z7982 Long term (current) use of aspirin: Secondary | ICD-10-CM | POA: Insufficient documentation

## 2022-08-16 DIAGNOSIS — Z79899 Other long term (current) drug therapy: Secondary | ICD-10-CM | POA: Insufficient documentation

## 2022-08-16 DIAGNOSIS — I700 Atherosclerosis of aorta: Secondary | ICD-10-CM | POA: Diagnosis not present

## 2022-08-16 DIAGNOSIS — I2510 Atherosclerotic heart disease of native coronary artery without angina pectoris: Secondary | ICD-10-CM | POA: Insufficient documentation

## 2022-08-16 LAB — URINALYSIS, ROUTINE W REFLEX MICROSCOPIC
Bilirubin Urine: NEGATIVE
Glucose, UA: NEGATIVE mg/dL
Hgb urine dipstick: NEGATIVE
Ketones, ur: NEGATIVE mg/dL
Leukocytes,Ua: NEGATIVE
Nitrite: NEGATIVE
Protein, ur: NEGATIVE mg/dL
Specific Gravity, Urine: 1.005 (ref 1.005–1.030)
pH: 7 (ref 5.0–8.0)

## 2022-08-16 LAB — CBC WITH DIFFERENTIAL/PLATELET
Abs Immature Granulocytes: 0.01 10*3/uL (ref 0.00–0.07)
Basophils Absolute: 0 10*3/uL (ref 0.0–0.1)
Basophils Relative: 0 %
Eosinophils Absolute: 0.1 10*3/uL (ref 0.0–0.5)
Eosinophils Relative: 1 %
HCT: 36.7 % (ref 36.0–46.0)
Hemoglobin: 11.7 g/dL — ABNORMAL LOW (ref 12.0–15.0)
Immature Granulocytes: 0 %
Lymphocytes Relative: 13 %
Lymphs Abs: 1 10*3/uL (ref 0.7–4.0)
MCH: 28.7 pg (ref 26.0–34.0)
MCHC: 31.9 g/dL (ref 30.0–36.0)
MCV: 90 fL (ref 80.0–100.0)
Monocytes Absolute: 0.9 10*3/uL (ref 0.1–1.0)
Monocytes Relative: 12 %
Neutro Abs: 5.8 10*3/uL (ref 1.7–7.7)
Neutrophils Relative %: 74 %
Platelets: 215 10*3/uL (ref 150–400)
RBC: 4.08 MIL/uL (ref 3.87–5.11)
RDW: 13.3 % (ref 11.5–15.5)
WBC: 7.7 10*3/uL (ref 4.0–10.5)
nRBC: 0 % (ref 0.0–0.2)

## 2022-08-16 LAB — COMPREHENSIVE METABOLIC PANEL
ALT: 20 U/L (ref 0–44)
AST: 27 U/L (ref 15–41)
Albumin: 4.2 g/dL (ref 3.5–5.0)
Alkaline Phosphatase: 35 U/L — ABNORMAL LOW (ref 38–126)
Anion gap: 8 (ref 5–15)
BUN: 13 mg/dL (ref 8–23)
CO2: 26 mmol/L (ref 22–32)
Calcium: 9.4 mg/dL (ref 8.9–10.3)
Chloride: 103 mmol/L (ref 98–111)
Creatinine, Ser: 0.75 mg/dL (ref 0.44–1.00)
GFR, Estimated: >60 mL/min (ref >60)
Glucose, Bld: 103 mg/dL — ABNORMAL HIGH (ref 70–99)
Potassium: 3.7 mmol/L (ref 3.5–5.1)
Sodium: 137 mmol/L (ref 135–145)
Total Bilirubin: 1 mg/dL (ref 0.3–1.2)
Total Protein: 7.2 g/dL (ref 6.5–8.1)

## 2022-08-16 LAB — LIPASE, BLOOD: Lipase: 32 U/L (ref 11–51)

## 2022-08-16 LAB — LACTIC ACID, PLASMA: Lactic Acid, Venous: 0.9 mmol/L (ref 0.5–1.9)

## 2022-08-16 MED ORDER — SODIUM CHLORIDE (PF) 0.9 % IJ SOLN
INTRAMUSCULAR | Status: AC
  Filled 2022-08-16: qty 50

## 2022-08-16 MED ORDER — IOHEXOL 300 MG/ML  SOLN
100.0000 mL | Freq: Once | INTRAMUSCULAR | Status: AC | PRN
  Administered 2022-08-16: 100 mL via INTRAVENOUS

## 2022-08-16 NOTE — ED Notes (Signed)
Patient transported to CT 

## 2022-08-16 NOTE — ED Triage Notes (Addendum)
Here by POV from home with family for RLQ/groin pain. Also endorses HA. Denies hip pain. Denies sx other than pain. Denies NVD, fever, syncope. Tried miralax and fleet's enema. Last BM minimal/ tiny bit today. Sx fluctuating x1 week, constant today.

## 2022-08-16 NOTE — ED Notes (Signed)
Unable to triage at this patient is in the restroom

## 2022-08-16 NOTE — Discharge Instructions (Signed)
Your history, exam and workup today are consistent with constipation likely causing the intense abdominal pain you are feeling.  They do not see evidence of obstruction on the imaging and we went through all the findings together than they were reassuring.  Please use MiraLAX daily which you have tolerated in the past and make sure you are staying hydrated.  Please follow-up with your primary doctor and they may even want you to follow-up with an outpatient GI team.  We feel you are safe for discharge home given your stability for over 6 hours.  Please return to the nearest emergency department if any symptoms change or worsen acutely.

## 2022-08-16 NOTE — ED Provider Notes (Signed)
EMERGENCY DEPARTMENT AT [facility] Provider Note   CSN: [id number] Arrival date & time: 08/16/22  [id number]     History  Chief Complaint  Patient presents with   Abdominal Pain    RLQ/groin    Sara Brown is a 82 y.o. female.  The history is provided by the patient and medical records. No language interpreter was used.  Abdominal Pain Pain location:  Generalized Pain quality: aching   Pain radiates to:  Does not radiate Pain severity:  Severe Onset quality:  Gradual Duration:  2 weeks Timing:  Constant Progression:  Waxing and waning Chronicity:  New Context: not sick contacts and not trauma   Relieved by:  Nothing Worsened by:  Nothing Ineffective treatments:  Acetaminophen Associated symptoms: constipation and hematuria   Associated symptoms: no anorexia, no chest pain, no chills, no cough, no diarrhea, no dysuria, no fatigue, no fever, no flatus, no melena, no nausea, no shortness of breath, no vaginal bleeding, no vaginal discharge and no vomiting        Home Medications Prior to Admission medications   Medication Sig Start Date End Date Taking? Authorizing Provider  albuterol (PROVENTIL HFA;VENTOLIN HFA) 108 (90 Base) MCG/ACT inhaler Inhale 1 puff into the lungs every 4 (four) hours as needed for wheezing. 07/23/18   [provider]  alendronate (FOSAMAX) 70 MG tablet Take 70 mg by mouth every Saturday. 08/09/18   [provider]  aspirin EC 81 MG tablet Take 1 tablet (81 mg total) by mouth daily. Swallow whole. 01/07/22   Patwardhan, Reynold Bowen, MD  Calcium Citrate-Vitamin D 315-250 MG-UNIT TABS Take 2 tablets by mouth daily.    [provider]  carboxymethylcellulose (REFRESH PLUS) 0.5 % SOLN Place 1 drop into both eyes in the morning and at bedtime.    [provider]  Cholecalciferol (VITAMIN D3) 10 MCG (400 UNIT) CAPS Take 800 Units by mouth daily.    [provider]  clobetasol cream  (TEMOVATE) 0.05 % APPLY TO AFFECTED AREA DAILY    [provider]  Clobetasol Prop Emollient Base (CLOBETASOL PROPIONATE E) 0.05 % emollient cream Apply 1 application topically 2 (two) times daily as needed. 07/02/21   Lavonna Monarch, MD  cloNIDine (CATAPRES) 0.1 MG tablet TAKE 1 TABLET BY MOUTH AT BEDTIME , MAY TAKE ADDITIONAL 1 TAB IF BLOOD PRESSURE IS HIGH 08/12/22   Patwardhan, Manish J, MD  Coenzyme Q10 (COQ10) 200 MG CAPS Take 200 mg by mouth daily.    [provider]  cycloSPORINE (RESTASIS) 0.05 % ophthalmic emulsion Place 1 drop into both eyes 2 (two) times daily.     [provider]  fluticasone (FLONASE) 50 MCG/ACT nasal spray Place 1 spray into both nostrils in the morning and at bedtime.    [provider]  Fluticasone-Salmeterol [facility] INHUB IN) Inhale into the lungs.    [provider]  Garlic 123XX123 MG CAPS Take 1,000 mg by mouth daily as needed (high blood pressure).    [provider]  Magnesium 250 MG TABS Take 250 mg by mouth daily.    [provider]  Multiple Vitamins-Minerals (CENTRUM SILVER) CHEW See admin instructions.    [provider]  NIFEdipine (ADALAT CC) 60 MG 24 hr tablet Take 1 tablet (60 mg total) by mouth daily. 07/10/22   Patwardhan, Reynold Bowen, MD  NON FORMULARY Garlique 1800    [provider]  Pitavastatin Calcium (LIVALO) 2 MG TABS TAKE 1 TABLET BY MOUTH  EVERY DAY 04/25/22   Patwardhan, Reynold Bowen, MD  polyethylene glycol (MIRALAX / GLYCOLAX) packet Take 17 g by mouth daily as needed for moderate constipation.    [provider]  pyridOXINE (VITAMIN B-6) 100 MG tablet Take 100 mg by mouth daily.     [provider]  spironolactone (ALDACTONE) 50 MG tablet Take 50 mg by mouth daily.     [provider]      Allergies    Amlodipine besylate, Hydralazine, Losartan potassium, Naproxen, Prednisone, Amlodipine, and Losartan    Review of Systems   Review of  Systems  Constitutional:  Negative for chills, fatigue and fever.  HENT:  Negative for congestion.   Respiratory:  Negative for cough, chest tightness, shortness of breath and wheezing.   Cardiovascular:  Negative for chest pain, palpitations and leg swelling.  Gastrointestinal:  Positive for abdominal pain and constipation. Negative for anorexia, blood in stool, diarrhea, flatus, melena, nausea and vomiting.  Genitourinary:  Positive for hematuria. Negative for dysuria, flank pain, frequency, vaginal bleeding and vaginal discharge.  Musculoskeletal:  Negative for back pain, neck pain and neck stiffness.  Skin:  Negative for rash and wound.  Neurological:  Negative for light-headedness, numbness and headaches.  Psychiatric/Behavioral:  Negative for agitation and confusion.   All other systems reviewed and are negative.   Physical Exam Updated Vital Signs BP 137/73 (BP Location: Left Arm)   Pulse 75   Temp 98.3 F (36.8 C) (Oral)   Resp 16   Ht '5\' 6"'$  (1.676 m)   Wt 57.2 kg   SpO2 100%   BMI 20.34 kg/m  Physical Exam Vitals and nursing note reviewed.  Constitutional:      General: She is not in acute distress.    Appearance: She is well-developed. She is not ill-appearing, toxic-appearing or diaphoretic.  HENT:     Head: Normocephalic and atraumatic.     Mouth/Throat:     Mouth: Mucous membranes are moist.  Eyes:     General: No scleral icterus.    Extraocular Movements: Extraocular movements intact.     Conjunctiva/sclera: Conjunctivae normal.  Cardiovascular:     Rate and Rhythm: Normal rate and regular rhythm.     Heart sounds: No murmur heard. Pulmonary:     Effort: Pulmonary effort is normal. No respiratory distress.     Breath sounds: Normal breath sounds. No wheezing, rhonchi or rales.  Chest:     Chest wall: No tenderness.  Abdominal:     General: Abdomen is flat. Bowel sounds are normal. There is no distension.     Palpations: Abdomen is soft.     Tenderness:  There is generalized abdominal tenderness. There is no right CVA tenderness, left CVA tenderness, guarding or rebound.  Musculoskeletal:        General: No swelling.     Cervical back: Neck supple.  Skin:    General: Skin is warm and dry.     Capillary Refill: Capillary refill takes less than 2 seconds.     Coloration: Skin is not pale.     Findings: No rash.  Neurological:     General: No focal deficit present.     Mental Status: She is alert.  Psychiatric:        Mood and Affect: Mood normal.     ED Results / Procedures / Treatments   Labs (all labs ordered are listed, but only abnormal results are displayed) Labs Reviewed  COMPREHENSIVE METABOLIC PANEL - Abnormal; Notable for  the following components:      Result Value   Glucose, Bld 103 (*)    Alkaline Phosphatase 35 (*)    All other components within normal limits  URINALYSIS, ROUTINE W REFLEX MICROSCOPIC - Abnormal; Notable for the following components:   Color, Urine STRAW (*)    All other components within normal limits  CBC WITH DIFFERENTIAL/PLATELET - Abnormal; Notable for the following components:   Hemoglobin 11.7 (*)    All other components within normal limits  LIPASE, BLOOD  LACTIC ACID, PLASMA  LACTIC ACID, PLASMA    EKG None  Radiology CT ABDOMEN PELVIS W CONTRAST  Result Date: 08/16/2022 CLINICAL DATA:  Diffuse abdominal pain especially in the right lower quadrant EXAM: CT ABDOMEN AND PELVIS WITH CONTRAST TECHNIQUE: Multidetector CT imaging of the abdomen and pelvis was performed using the standard protocol following bolus administration of intravenous contrast. RADIATION DOSE REDUCTION: This exam was performed according to the departmental dose-optimization program which includes automated exposure control, adjustment of the mA and/or kV according to patient size and/or use of iterative reconstruction technique. CONTRAST:  146m OMNIPAQUE IOHEXOL 300 MG/ML  SOLN COMPARISON:  01/02/2017 FINDINGS: Lower  chest: Mild cardiomegaly. Mild scarring or subsegmental atelectasis in both lower lobes and in the lingula. Mild scarring in the right middle lobe. Hepatobiliary: Unremarkable Pancreas: Unremarkable Spleen: Unremarkable Adrenals/Urinary Tract: No significant findings. Stomach/Bowel: Prominent stool throughout the colon favors constipation. No dilated small bowel. Mobile cecum sits down in the pelvis adjacent to the rectum appendix not well visualized. Vascular/Lymphatic: Atherosclerosis is present, including aortoiliac atherosclerotic disease. No pathologic adenopathy. Reproductive: Endometrial thickness 0.7 cm, image 60 series 5. Assuming no postmenopausal bleeding this is within normal limits. Other: No supplemental non-categorized findings. Musculoskeletal: Mild grade 1 degenerative anterolisthesis at L4-5 and L5-S1. Lower lumbar spondylosis and degenerative disc disease without overt impingement. IMPRESSION: 1. Prominent stool throughout the colon favors substantial constipation. No dilated small bowel to suggest obstruction. The appendix is not well visualized. 2. Mild cardiomegaly. 3. Lower lumbar spondylosis and degenerative disc disease without overt impingement. 4. Mild scarring or subsegmental atelectasis in both lower lobes and in the lingula. 5. Aortic atherosclerosis. Aortic Atherosclerosis (ICD10-I70.0). Electronically Signed   By: WVan ClinesM.D.   On: 08/16/2022 14:54   CT HEAD WO CONTRAST (5MM)  Result Date: 08/16/2022 CLINICAL DATA:  Headache, sudden, severe EXAM: CT HEAD WITHOUT CONTRAST TECHNIQUE: Contiguous axial images were obtained from the base of the skull through the vertex without intravenous contrast. RADIATION DOSE REDUCTION: This exam was performed according to the departmental dose-optimization program which includes automated exposure control, adjustment of the mA and/or kV according to patient size and/or use of iterative reconstruction technique. COMPARISON:  CT head  01/23/2022. FINDINGS: Brain: No evidence of acute infarction, hemorrhage, hydrocephalus, extra-axial collection or mass lesion/mass effect. Similar mild patchy white matter hypodensities, nonspecific but compatible with chronic microvascular ischemic change. Vascular: No hyperdense vessel identified. Calcific atherosclerosis. Skull: No acute fracture. Sinuses/Orbits: Clear sinuses.  No acute orbital findings. Other: No mastoid effusions. IMPRESSION: No evidence of acute abnormality. Electronically Signed   By: FMargaretha SheffieldM.D.   On: 08/16/2022 14:43    Procedures Procedures    Medications Ordered in ED Medications  sodium chloride (PF) 0.9 % injection (  Given by Other 08/16/22 1415)  iohexol (OMNIPAQUE) 300 MG/ML solution 100 mL (100 mLs Intravenous Contrast Given 08/16/22 1423)    ED Course/ Medical Decision Making/ A&P  Medical Decision Making Amount and/or Complexity of Data Reviewed Labs: ordered. Radiology: ordered.  Risk Prescription drug management.    Ashana Goldfarb is a 82 y.o. female with a past medical history significant for asthma, osteoporosis, GERD, hypertension, hyperlipidemia, previous TIA, anxiety, and CAD who presents with abdominal pain.  According to patient, for the last 2 weeks she has had abdominal pain in different places of her abdomen.  She reports at 1 point she was told she may have diverticulitis and was started on some medications but then she stopped taking them when she was concerned about scleral icterus.  She reports that she is also had constipation and is not passing gas for the last few days and has had decreased bowel movements chronically.  She reports no fevers or chills and denies any nausea or vomiting.  Denies any chest pain or shortness of breath.  Reports chronic headaches that are unchanged from baseline for years.  Denies any new leg swelling.  Denies trauma.  She reports abdominal pain is up to 10 out of  10 in severity at times and this morning it was so severe in the right lower quadrant she had to come in.  She was post to get blood work done today but came here instead.  On exam, lungs clear.  Chest nontender.  Abdomen is diffusely tender.  Bowel sounds were appreciated.  Back and flanks nontender.  Good pulses in extremities.  Legs have intact sensation and strength.  Legs are nonedematous.  Pupils are symmetric and reactive with normal extraocular movements.  Patient otherwise well-appearing.  Given the patient's 10 out of 10 abdominal pain as well as pain in the right lower quadrant and diffusely, I do feel he needs imaging.  Will get screening labs.  Will also get urinalysis given the abdominal discomfort.  We discussed that this could just be constipation causing her severe pain however with her possible history diverticulitis, the right lower quadrant tenderness, and she still has her appendix, gallbladder, and has otherwise not had surgeries there could be other etiologies.  She reports he does not want pain medicine at this time but agrees with workup.  Anticipate reassessment after workup.  2:19 PM While getting ready to get her CT abdomen/pelvis patient was asking the CT tech about CT of her head because of her headaches that have worsened.  Will order CT head without contrast.         Final Clinical Impression(s) / ED Diagnoses Final diagnoses:  Generalized abdominal pain  Constipation, unspecified constipation type    Rx / DC Orders ED Discharge Orders     None      Clinical Impression: 1. Generalized abdominal pain   2. Constipation, unspecified constipation type     Disposition: Discharge  Condition: Good  I have discussed the results, Dx and Tx plan with the pt(& family if present). He/she/they expressed understanding and agree(s) with the plan. Discharge instructions discussed at great length. Strict return precautions discussed and pt &/or family have  verbalized understanding of the instructions. No further questions at time of discharge.    New Prescriptions   No medications on file    Follow Up: Seward Carol, MD [address] [place] [address] [place] [facility] at [facility] [address] [place]       Sara Brown, Gwenyth Allegra, MD 08/16/22 [phone]

## 2022-08-19 ENCOUNTER — Telehealth: Payer: Self-pay

## 2022-08-19 NOTE — Telephone Encounter (Signed)
Patient called and asked you review her recent ED visit she was told to contact you.

## 2022-08-19 NOTE — Telephone Encounter (Signed)
No. 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 We can repeat echocardiogram but would not change management Most likely, would show mild thickening of the heart fue to hypertension.  Thanks MJP

## 2022-08-19 NOTE — Telephone Encounter (Signed)
Reviewed. Likely due to constipation.  Thanks MJP

## 2022-08-19 NOTE — Telephone Encounter (Signed)
So does she not need to be concerned about the Mild cardiomegaly seen on her CT?   Thanks

## 2022-08-20 ENCOUNTER — Telehealth: Payer: Self-pay

## 2022-08-20 ENCOUNTER — Ambulatory Visit (INDEPENDENT_AMBULATORY_CARE_PROVIDER_SITE_OTHER): Payer: Medicare Other | Admitting: Neurology

## 2022-08-20 ENCOUNTER — Encounter: Payer: Self-pay | Admitting: Neurology

## 2022-08-20 VITALS — BP 161/76 | HR 68 | Wt 128.0 lb

## 2022-08-20 DIAGNOSIS — R519 Headache, unspecified: Secondary | ICD-10-CM

## 2022-08-20 DIAGNOSIS — R202 Paresthesia of skin: Secondary | ICD-10-CM

## 2022-08-20 MED ORDER — GABAPENTIN 250 MG/5ML PO SOLN
ORAL | 6 refills | Status: DC
Start: 2022-08-20 — End: 2022-10-29

## 2022-08-20 NOTE — Telephone Encounter (Signed)
LMTCB

## 2022-08-20 NOTE — Telephone Encounter (Signed)
Initial follow up after starting home blood pressure monitoring with RPM. Patient home blood pressure is well controlled.   Systolic Blood Pressure mmHg -- 121.8 ([id number] - [id number]) Diastolic Blood Pressure mmHg -- 77.1 (63.0 - 97.0) Heart Rate bpm -- 67.3 (55.0 - 80.0)  08/20/22 3:59 AM 112 / 70 mmHg 58 bpm  08/19/22 10:32 PM 114 / 71 mmHg 68 bpm  08/19/22 4:13 PM 148 / 97 mmHg 65 bpm  08/19/22 1:12 AM 108 / 70 mmHg 63 bpm  08/18/22 10:21 PM 115 / 71 mmHg 64 bpm  08/18/22 1:23 PM 131 / 82 mmHg 68 bpm  08/18/22 10:54 AM 108 / 70 mmHg 65 bpm  08/18/22 1:33 AM 115 / 70 mmHg 65 bpm  08/18/22 1:25 AM 116 / 69 mmHg 67 bpm

## 2022-08-20 NOTE — Progress Notes (Signed)
NEUROLOGY FOLLOW UP OFFICE NOTE  Sara Brown [id number] 1941/01/13  HISTORY OF PRESENT ILLNESS: I had the pleasure of seeing Sara Brown in follow-up in the neurology clinic on 08/20/2022.  The patient was last seen 5 months ago. She was being seen in our office due to concern for seizures due to recurrent intermittent episodes of paresthesias on one side of her body, initially on the right, then other times on the left. Her routine EEGs and her 24-hour EEG in 03/2021 showed occasional focal slowing over the left temporal region, no epileptiform discharges seen. Repeat brain MRIs have not shown any acute changes, no structural abnormality on the left temporal region. She has weaned off Levetiracetam with no seizures or seizure-like symptoms. She presents today for new symptoms of headache. On review of records, when she was seeing Dr. Rexene Alberts in 2021, she was reporting fairly short-lived headaches in the top of the head, sometimes in the back or parietal areas. ESR was normal, brain MRI with and without contrast done for this symptom no acute changes, there was mild chronic microvascular disease.   She states that she is having that feeling again over the vertex and temples. She has difficulty describing the sensation but it bothers her, she is "feeling it in there," there is no tenderness but it hurts "not like a regular headache. She has to go to bed and be still when she feels it. After she sleeps for a few hours, she wakes up feeling fine. She recalls an episode 2 Saturdays ago where it was bothering her so bad she had to go to bed, sometimes it may come back the next day. Last week she noticed it several times, "it was awful." She went to the ER for abdominal pain 4 days ago and had a normal head CT. She clears her sinuses a lot and has post-nasal drip but denies any congestion. She endorses a stressful situation ongoing. She does not know how much sleep she gets. She also reports cramps  in her legs making her scream out. She has sharp pains in her toes L>R.   History on Initial Assessment 03/01/2021: This is an 82 year old right-handed woman with a history of hypertension, hyperlipidemia, diabetes, neuropathy, left carotid stenosis, presenting for second opinion regarding seizures. She had been seeing neurologist Dr. Rexene Alberts since 2016 for several year history of intermittent abnormal sensations in her right face, right arm, pulling sensation in right arm and shoulders. Work-up in 2016 showed a normal EEG and unremarkable brain MRI. She was in the ER in 02/2018 for facial paresthesias and discomfort, at that time she reported symptoms on the left arm and face, as well as tongue. Head CT no acute changes. He had a repeat brain MRI in 1/202 with no acute changes, mild chronic microvascular disease. She was in the ER in 08/2020 due to elevated BP, she reported symptoms on the left side of her face, lip, and tongue. She also felt an abnormal sensation in her left leg like something was moving, making her feel as though she could not walk, bu tin the ER, she reported it may have occurred on the right leg but not as pronounced on the left. MRI brain and MRA head and neck in 08/2020 did not show any acute changes or significant stenosis. EEG in 08/2020 reported focal slowing over the left temporal region. She was discharged home on Levetiracetam 500mg  BID. She was back in the ER on 11/13/20 for sudden-onset right-sided numbness  involving the forehead, face, lip, arm and leg. She also reported clumsiness and heaviness in the right leg. Another brain MRI done did not show any acute changes, MRA no significant stenosis. EEG again reported left temporal slowing. Levetiracetam was continued and she was advised to take DAPT with aspirin and Plavix for 3 weeks, the Plavix thereafter.   She also notes that when her BP goes too high or low, she gets symptoms on the side of her face. That day in June, she felt it on  the right side, with tightening or heaviness in her tongue and lip, no physical changes visible to her husband. Her husband denies any staring/unresponsive episodes. She states her tongue is heavy right now because her BP is low. She had also been reporting headaches to Dr. Rexene Alberts. She states the pain on the top her her head has not been recent, she cannot recall the last time she had a headache. She states it is not a headache but a stabbing kind of pain. She feels dizzy when her BP is off. She is asking about her memory, she reports being tested and score was 23/30. She is usually pretty good with remembering her medications. She uses the computer frequently and denies forgetting passwords. She has not been driving, but was not getting lost when she drove. She "probably does not sleep enough."  Her brother had epilepsy. Otherwise she had a normal birth and early development.  There is no history of febrile convulsions, CNS infections such as meningitis/encephalitis, significant traumatic brain injury, neurosurgical procedures.  Diagnostic Data: 24-hour EEG in 03/2021 showed occasional left temporal slowing, no epileptiform discharges. Episodes of chest sensations and heavy breathing did not show any EEG correlate.   Neuropsychological testing in 08/2021 with current test scores did not indicate a diagnosis of a cognitive disorder. There was an isolated impairment across a line orientation task, which may be due to vision changes. Vascular issues may also be contributing. Testing did not lateralize to the left hemisphere. Psychiatric distress, various stressors, chronic pain, and sleep disturbances could also create inefficiencies.     Lab Results  Component Value Date   TSH 3.258 09/05/2020   No results found for: VITAMINB12  Lab Results  Component Value Date   HGBA1C 5.9 (H) 11/14/2020    PAST MEDICAL HISTORY: Past Medical History:  Diagnosis Date   Age-related osteoporosis without current  pathological fracture 08/18/2020   Allergic rhinitis    Angina at rest 02/09/2018   Arthritis    "fingers, knees" (02/09/2018)   Asthma    "stopped taking RX after dr said I don't have this" (02/09/2018)   Basal cell carcinoma    Bradycardia 02/09/2018   Carotid stenosis    ICA(L)   Chest pain due to coronary artery disease 02/09/2018   Chronic constipation [id number]   Complication of anesthesia    "felt the cut w/one of my c-sections" (02/09/2018)   Coronary artery disease involving native coronary artery of native heart without angina pectoris 09/17/2018   Diabetes mellitus type 2, controlled 03/31/2018   EEG abnormal    Essential hypertension 03/31/2018   Generalized anxiety disorder    GERD (gastroesophageal reflux disease)    Hardening of the aorta (main artery of the heart)    Headache    "years ago; before menopause" (02/09/2018)   Hirsutism 06/12/2017   Hyperglycemia due to type 2 diabetes mellitus 03/09/2021   Hyperlipidemia    Hyperpigmentation 02/13/2017   Hypertensive retinopathy    Irritable  bowel syndrome    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   Mixed hyperlipidemia 12/29/2020   Myringotomy tube status 04/03/2021   Neck mass 01/17/2017   Neuropathic pain 03/09/2021   OA (osteoarthritis)    Osteoporosis    Palpitations 03/16/2021   Paresthesias 09/05/2020   Personal history of colonic polyps    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      alendronate (FOSAMAX) 70 MG tablet Take 70 mg by mouth every Saturday.     aspirin EC 81 MG tablet Take 1 tablet (81 mg total) by mouth daily. Swallow whole. 90 tablet 3   Calcium Citrate-Vitamin D 315-250 MG-UNIT TABS Take 2 tablets by mouth daily.     carboxymethylcellulose (REFRESH PLUS) 0.5 % SOLN Place 1 drop into both eyes in the morning and at bedtime.     Cholecalciferol (VITAMIN D3) 10 MCG (400 UNIT) CAPS Take 800 Units by mouth daily.     clobetasol cream (TEMOVATE) 0.05 % APPLY TO AFFECTED AREA DAILY     Clobetasol Prop Emollient Base (CLOBETASOL PROPIONATE E) 0.05 % emollient cream Apply 1 application topically 2 (two) times daily as needed. 60 g 6   cloNIDine (CATAPRES) 0.1 MG tablet TAKE 1 TABLET BY MOUTH AT BEDTIME , MAY TAKE ADDITIONAL 1 TAB IF BLOOD PRESSURE IS HIGH 180 tablet 1   Coenzyme Q10 (COQ10) 200 MG CAPS Take 200 mg by mouth daily.     cycloSPORINE (RESTASIS) 0.05 % ophthalmic emulsion Place 1 drop into both eyes 2 (two) times daily.      fluticasone (FLONASE) 50 MCG/ACT nasal spray Place 1 spray into both nostrils in the morning and at bedtime.     Fluticasone-Salmeterol ([place]) Inhale into the lungs.     Garlic 123XX123 MG CAPS Take 1,000 mg by mouth daily as needed (high blood pressure).     Magnesium 250 MG TABS Take 250 mg by mouth daily.     Multiple Vitamins-Minerals (CENTRUM SILVER) CHEW See admin instructions.     NIFEdipine (ADALAT CC) 60 MG 24 hr tablet Take 1 tablet (60 mg total) by mouth daily. 90 tablet 3   NON FORMULARY Garlique 1800     Pitavastatin Calcium (LIVALO) 2 MG TABS TAKE 1 TABLET BY MOUTH EVERY DAY 336 tablet 1   polyethylene glycol (MIRALAX / GLYCOLAX) packet Take 17 g by mouth daily as needed for moderate constipation.     pyridOXINE (VITAMIN B-6) 100 MG tablet Take 100 mg by mouth daily.      spironolactone (ALDACTONE) 50 MG tablet Take 50 mg by mouth daily.      No current facility-administered medications on file prior to visit.     ALLERGIES: Allergies  Allergen Reactions   Amlodipine Besylate     Other reaction(s): rash and hives   Hydralazine     Other reaction(s): rash   Losartan Potassium     Other reaction(s): rash and HIves   Naproxen Other (See Comments)  Patient preference  Other reaction(s): upset stomach   Prednisone Other (See Comments)    Patient Preference  Other reaction(s): upset stomach   Amlodipine Hives   Losartan Rash    FAMILY HISTORY: Family History  Problem Relation Age of Onset   Diabetes Mellitus II Mother    Osteoporosis Mother    Hypertension Mother    Diabetes Mellitus II Father    CVA Sister    CAD Sister    Hypertension Sister    Osteoarthritis Sister    Alcoholism Brother    Epilepsy Brother    Breast cancer Cousin 48   Dementia Cousin        suspected; never confirmed    SOCIAL HISTORY: Social History   Socioeconomic History   Marital status: Married    Spouse name: Bland Span   Number of children: 2   Years of education: 16   Highest education level: Bachelor's degree (e.g., BA, AB, BS)  Occupational History   Occupation: Retired  Tobacco Use   Smoking status: Never   Smokeless tobacco: Never  Vaping Use   Vaping Use: Never used  Substance and Sexual Activity   Alcohol use: Never   Drug use: Never   Sexual activity: Not Currently  Other Topics Concern   Not on file  Social History Narrative   Consumes no caffeine   Right handed   One story house    Social Determinants of Health   Financial Resource Strain: Low Risk  (03/31/2018)   Overall Financial Resource Strain (CARDIA)    Difficulty of Paying Living Expenses: Not hard at all  Food Insecurity: No Food Insecurity (03/31/2018)   Hunger Vital Sign    Worried About Running Out of Food in the Last Year: Never true    Ran Out of Food in the Last Year: Never true  Transportation Needs: No Transportation Needs (03/31/2018)   PRAPARE - Hydrologist (Medical): No     Lack of Transportation (Non-Medical): No  Physical Activity: Insufficiently Active (03/31/2018)   Exercise Vital Sign    Days of Exercise per Week: 1 day    Minutes of Exercise per Session: 30 min  Stress: No Stress Concern Present (03/31/2018)   [place]    Feeling of Stress : Not at all  Social Connections: Moderately Integrated (03/31/2018)   Social Connection and Isolation Panel [NHANES]    Frequency of Communication with Friends and Family: More than three times a week    Frequency of Social Gatherings with Friends and Family: More than three times a week    Attends Religious Services: 1 to 4 times per year    Active Member of [employer] or Organizations: No    Attends Archivist Meetings: Never    Marital Status: Married  Human resources officer Violence: Not At Risk (03/31/2018)   Humiliation, Afraid, Rape, and Kick questionnaire    Fear of Current or Ex-Partner: No    Emotionally Abused: No    Physically Abused: No    Sexually Abused: No     PHYSICAL EXAM: Vitals:   08/20/22 1257  BP: (!) 161/76  Pulse: 68  SpO2: 97%   General: No acute distress Head:  Normocephalic/atraumatic, no tenderness to palpation over sites where headaches occur Skin/Extremities: No rash, no edema Neurological Exam: alert and awake. No aphasia or dysarthria. Fund of knowledge is appropriate.  Attention and concentration are normal.   Cranial nerves: Pupils equal,  round. Extraocular movements intact with no nystagmus. Visual fields full.  No facial asymmetry.  Motor: Bulk and tone normal, muscle strength 5/5 throughout with no pronator drift.  Sensation: intact to all modalities except decreased vibration sense to right knee and left ankle. Reflexes +2 throughout except for absent ankle jerks. Finger to nose testing intact.  Gait narrow-based and steady, able to tandem walk adequately.  Romberg negative.   IMPRESSION: This is a  pleasant 82 yo RH woman with a history of hypertension, hyperlipidemia, diabetes, neuropathy, left carotid stenosis, who presented in 02/2021 for second opinion regarding seizures. She was having recurrent intermittent episodes of paresthesias on one side of her body, initially reporting symptoms on the right side, but other times on the left. Her routine EEGs and her 24-hour EEG in 03/2021 showed occasional focal slowing over the left temporal region, no epileptiform discharges seen. Repeat brain MRIs have not shown any acute changes, no structural abnormality on the left temporal region. Symptoms atypical for seizure, she has weaned off Levetiracetam with no recurrence of symptoms. Main concern today are head sensations of unclear etiology. Neurological exam shows mild neuropathy. Head CT no acute changes. We discussed starting symptomatic treatment with low dose Gabapentin 50mg  qhs (gabapentin 250mg /62mL: take 1 mL qhs). Side effects discussed, this may help with neuropathic pain as well. She was advised to monitor how sleep (and stress) affect her symptoms. Follow-up as scheduled in September, call for any changes.    Thank you for allowing me to participate in her care.  Please do not hesitate to call for any questions or concerns.    Ellouise Newer, M.D.   CC: Dr. Delfina Redwood

## 2022-08-20 NOTE — Telephone Encounter (Signed)
Informed patient of Dr. Bonney Roussel reply per her ER Visit concern. Patient verbalized understanding.

## 2022-08-20 NOTE — Patient Instructions (Signed)
Good to see you.  Try the low dose Gabapentin '250mg'$ /54m: take 1 mL ('50mg'$ ) every night or as needed, depending on how you are feeling. We can adjust dose as tolerated  2. Start keeping track of sleep hours and see if it is related to your symptoms  3. Follow-up as scheduled in September, call for any changes

## 2022-08-21 NOTE — Telephone Encounter (Signed)
Agree  Thanks MJP  

## 2022-08-22 DIAGNOSIS — K5900 Constipation, unspecified: Secondary | ICD-10-CM | POA: Diagnosis not present

## 2022-08-22 DIAGNOSIS — R109 Unspecified abdominal pain: Secondary | ICD-10-CM | POA: Diagnosis not present

## 2022-08-23 ENCOUNTER — Telehealth: Payer: Self-pay

## 2022-09-14 DIAGNOSIS — I5020 Unspecified systolic (congestive) heart failure: Secondary | ICD-10-CM | POA: Diagnosis not present

## 2022-09-17 ENCOUNTER — Ambulatory Visit: Payer: Medicare Other | Admitting: Neurology

## 2022-09-30 ENCOUNTER — Telehealth: Payer: Self-pay

## 2022-09-30 NOTE — Telephone Encounter (Signed)
I did not understand. Internal what?

## 2022-09-30 NOTE — Telephone Encounter (Signed)
Patient called asking if the Nifedipine 60 mg could affect her internal. Please advise

## 2022-10-01 DIAGNOSIS — Z0000 Encounter for general adult medical examination without abnormal findings: Secondary | ICD-10-CM | POA: Diagnosis not present

## 2022-10-01 DIAGNOSIS — I10 Essential (primary) hypertension: Secondary | ICD-10-CM | POA: Diagnosis not present

## 2022-10-01 DIAGNOSIS — I700 Atherosclerosis of aorta: Secondary | ICD-10-CM | POA: Diagnosis not present

## 2022-10-01 DIAGNOSIS — Z1331 Encounter for screening for depression: Secondary | ICD-10-CM | POA: Diagnosis not present

## 2022-10-01 DIAGNOSIS — K5901 Slow transit constipation: Secondary | ICD-10-CM | POA: Diagnosis not present

## 2022-10-01 DIAGNOSIS — M810 Age-related osteoporosis without current pathological fracture: Secondary | ICD-10-CM | POA: Diagnosis not present

## 2022-10-01 DIAGNOSIS — E7800 Pure hypercholesterolemia, unspecified: Secondary | ICD-10-CM | POA: Diagnosis not present

## 2022-10-01 DIAGNOSIS — E1169 Type 2 diabetes mellitus with other specified complication: Secondary | ICD-10-CM | POA: Diagnosis not present

## 2022-10-01 DIAGNOSIS — H35033 Hypertensive retinopathy, bilateral: Secondary | ICD-10-CM | POA: Diagnosis not present

## 2022-10-01 NOTE — Telephone Encounter (Signed)
Internal damage when taking it.

## 2022-10-01 NOTE — Telephone Encounter (Signed)
No

## 2022-10-01 NOTE — Telephone Encounter (Signed)
Called patient no answer left a vm

## 2022-10-02 ENCOUNTER — Other Ambulatory Visit: Payer: Self-pay | Admitting: Cardiology

## 2022-10-14 DIAGNOSIS — I10 Essential (primary) hypertension: Secondary | ICD-10-CM | POA: Diagnosis not present

## 2022-10-15 ENCOUNTER — Ambulatory Visit (INDEPENDENT_AMBULATORY_CARE_PROVIDER_SITE_OTHER): Payer: Medicare Other | Admitting: Podiatry

## 2022-10-15 DIAGNOSIS — E1142 Type 2 diabetes mellitus with diabetic polyneuropathy: Secondary | ICD-10-CM | POA: Diagnosis not present

## 2022-10-15 DIAGNOSIS — M2041 Other hammer toe(s) (acquired), right foot: Secondary | ICD-10-CM | POA: Diagnosis not present

## 2022-10-15 DIAGNOSIS — M2042 Other hammer toe(s) (acquired), left foot: Secondary | ICD-10-CM

## 2022-10-15 NOTE — Progress Notes (Signed)
Patient presents today to pick up diabetic shoes and insoles.  Patient was dispensed 1 pair of diabetic shoes and 3 pairs of foam casted diabetic insoles. She tried on the shoes with the insoles and the fit was satisfactory.   Will follow up next year for new order.    

## 2022-10-20 ENCOUNTER — Other Ambulatory Visit: Payer: Self-pay | Admitting: Cardiology

## 2022-10-29 ENCOUNTER — Ambulatory Visit: Payer: Medicare Other | Admitting: Cardiology

## 2022-10-29 ENCOUNTER — Encounter: Payer: Self-pay | Admitting: Cardiology

## 2022-10-29 ENCOUNTER — Telehealth: Payer: Self-pay

## 2022-10-29 VITALS — BP 128/73 | HR 81 | Ht 66.0 in | Wt 126.0 lb

## 2022-10-29 DIAGNOSIS — E782 Mixed hyperlipidemia: Secondary | ICD-10-CM

## 2022-10-29 DIAGNOSIS — E1169 Type 2 diabetes mellitus with other specified complication: Secondary | ICD-10-CM | POA: Diagnosis not present

## 2022-10-29 DIAGNOSIS — M810 Age-related osteoporosis without current pathological fracture: Secondary | ICD-10-CM | POA: Diagnosis not present

## 2022-10-29 DIAGNOSIS — F418 Other specified anxiety disorders: Secondary | ICD-10-CM | POA: Diagnosis not present

## 2022-10-29 DIAGNOSIS — I700 Atherosclerosis of aorta: Secondary | ICD-10-CM | POA: Diagnosis not present

## 2022-10-29 DIAGNOSIS — E7800 Pure hypercholesterolemia, unspecified: Secondary | ICD-10-CM | POA: Diagnosis not present

## 2022-10-29 DIAGNOSIS — I10 Essential (primary) hypertension: Secondary | ICD-10-CM | POA: Diagnosis not present

## 2022-10-29 DIAGNOSIS — R350 Frequency of micturition: Secondary | ICD-10-CM | POA: Diagnosis not present

## 2022-10-29 MED ORDER — CARVEDILOL 3.125 MG PO TABS: 3.1250 mg | ORAL_TABLET | Freq: Two times a day (BID) | ORAL | 2 refills | Status: DC

## 2022-10-29 NOTE — Progress Notes (Signed)
Follow up visit  Subjective:   Sara Brown, female    DOB: 1940-10-02, 82 y.o.   MRN: [id number]   Chief Complaint  Patient presents with   Essential hypertension   Mixed hyperlipidemia   Follow-up     82 y/o African-American female with hypertension, diet controlled diabetes, hyperlipidemia,  GERD and remote history of TIA, mild nonobstructive coronary artery disease (Cath 04/01/2018).  Patient has highlighted list of side effects and wonders if nifedipine is causing them.  This includes as needed nasal congestion, skin color changes etc.  Blood pressure is well-controlled today.  . Current Outpatient Medications:    albuterol (PROVENTIL HFA;VENTOLIN HFA) 108 (90 Base) MCG/ACT inhaler, Inhale 1 puff into the lungs every 4 (four) hours as needed for wheezing., Disp: , Rfl:    alendronate (FOSAMAX) 70 MG tablet, Take 70 mg by mouth every Saturday., Disp: , Rfl:    aspirin EC 81 MG tablet, Take 1 tablet (81 mg total) by mouth daily. Swallow whole., Disp: 90 tablet, Rfl: 3   Calcium Citrate-Vitamin D 315-250 MG-UNIT TABS, Take 2 tablets by mouth daily., Disp: , Rfl:    carboxymethylcellulose (REFRESH PLUS) 0.5 % SOLN, Place 1 drop into both eyes in the morning and at bedtime., Disp: , Rfl:    Cholecalciferol (VITAMIN D3) 10 MCG (400 UNIT) CAPS, Take 800 Units by mouth daily., Disp: , Rfl:    clobetasol cream (TEMOVATE) 0.05 %, APPLY TO AFFECTED AREA DAILY, Disp: , Rfl:    Clobetasol Prop Emollient Base (CLOBETASOL PROPIONATE E) 0.05 % emollient cream, Apply 1 application topically 2 (two) times daily as needed., Disp: 60 g, Rfl: 6   cloNIDine (CATAPRES) 0.1 MG tablet, TAKE 1 TABLET BY MOUTH AT BEDTIME , MAY TAKE ADDITIONAL 1 TAB IF BLOOD PRESSURE IS HIGH, Disp: 180 tablet, Rfl: 1   Coenzyme Q10 (COQ10) 200 MG CAPS, Take 200 mg by mouth daily., Disp: , Rfl:    cycloSPORINE (RESTASIS) 0.05 % ophthalmic emulsion, Place 1 drop into both eyes 2 (two) times daily. , Disp: , Rfl:     fluticasone (FLONASE) 50 MCG/ACT nasal spray, Place 1 spray into both nostrils in the morning and at bedtime., Disp: , Rfl:    Fluticasone-Salmeterol ([place]IX[place] INHUB IN), Inhale into the lungs., Disp: , Rfl:    gabapentin (NEURONTIN) 250 MG/5ML solution, Take 1 mL (50mg ) every night, Disp: 470 mL, Rfl: 6   Garlic 1000 MG CAPS, Take 1,000 mg by mouth daily as needed (high blood pressure)., Disp: , Rfl:    Magnesium 250 MG TABS, Take 250 mg by mouth daily., Disp: , Rfl:    Multiple Vitamins-Minerals (CENTRUM SILVER) CHEW, See admin instructions., Disp: , Rfl:    NIFEdipine (ADALAT CC) 60 MG 24 hr tablet, Take 1 tablet (60 mg total) by mouth daily., Disp: 90 tablet, Rfl: 3   NIFEdipine (PROCARDIA-XL/NIFEDICAL-XL) 30 MG 24 hr tablet, TAKE 1 TABLET BY MOUTH EVERY DAY, Disp: 90 tablet, Rfl: 1   NON FORMULARY, Garlique 1800, Disp: , Rfl:    NON FORMULARY, Take 1 tablet by mouth daily. Bone up takes one tablet daily, Disp: , Rfl:    Pitavastatin Calcium (LIVALO) 2 MG TABS, TAKE 1 TABLET BY MOUTH EVERY DAY, Disp: 336 tablet, Rfl: 1   polyethylene glycol (MIRALAX / GLYCOLAX) packet, Take 17 g by mouth daily as needed for moderate constipation., Disp: , Rfl:    pyridOXINE (VITAMIN B-6) 100 MG tablet, Take 100 mg by mouth daily. , Disp: , Rfl:  spironolactone (ALDACTONE) 50 MG tablet, Take 50 mg by mouth daily. , Disp: , Rfl:   Cardiovascular & other pertient studies:  EKG 01/07/2022: Sinus rhythm 61 bpm  Low voltage Nonspecific T-abnormality  Renal artery duplex  12/14/2020:  No evidence of renal artery occlusive disease in either renal artery.  Normal intrarenal vascular perfusion is noted in both kidneys.  Renal length is within normal limits for both kidneys.  Diffuse plaque observed in the abdominal aorta. Normal abdominal aorta  flow velocities noted.  Coronary angiogram 04/01/2018: LM: Normal LAD: Prox-mid 40% stenosis. iFR 0.99 (Physiologically nonsignificant) LCx: Dominant. Small branches  with mild disease RCA: Nondominant. Normal   LVEDP mildly elevated.   Carotid artery duplex 02/23/2018: No hemodynamically significant arterial disease in the internal carotid artery bilaterally. No significant plaque burden. Antegrade right vertebral artery flow. Antegrade left vertebral artery flow.  Echocardiogram 10/29/2017: Left ventricle cavity is normal in size. Mild concentric hypertrophy of the left ventricle. Normal global wall motion. Normal diastolic filling pattern. Calculated EF 69%. No significant valvular abnormality.  Insignificant pericardial effusion.  Recent labs: 11/19/2021: Glucose 77, BUN/Cr 19/0.91. EGFR 63. Na/K 140/4.2. Rest of the CMP normal H/H 12/37. MCV 88. Platelets 212 HbA1C 6.2% Chol 172, TG 57, HDL 65, LDL 96 TSH 2.0 normal  Review of Systems  Cardiovascular:  Negative for chest pain, dyspnea on exertion, leg swelling, palpitations and syncope.  Neurological:  Negative for headaches and paresthesias.       There were no vitals filed for this visit.     There is no height or weight on file to calculate BMI. There were no vitals filed for this visit.    Objective:   Physical Exam Vitals and nursing note reviewed.  Constitutional:      Appearance: She is well-developed.  Neck:     Vascular: No JVD.  Cardiovascular:     Rate and Rhythm: Normal rate and regular rhythm.     Pulses: Intact distal pulses.     Heart sounds: Normal heart sounds. No murmur heard. Pulmonary:     Effort: Pulmonary effort is normal.     Breath sounds: Normal breath sounds. No wheezing or rales.  Musculoskeletal:     Right lower leg: No edema.     Left lower leg: No edema.        Assessment & Recommendations:   82 y/o African-American female with hypertension, diet controlled diabetes, hyperlipidemia,  GERD and remote history of TIA, mild nonobstructive coronary artery disease (Cath 04/01/2018).  Hypertension: Intolerant to several medications-including  but not limited to losartan, amlodipine, hydralazine, atenolol. Now patient is worried about side effects of nifedipine. Discontinue nifedipine, start Coreg 3.125 mg twice daily.  This would avoid reflex tachycardia that could happen with discontinuation of nifedipine. Continue spironolactone 50 mg daily, clonidine 0.1 mg at bedtime with additional 1/2-1 pill as needed . We will need to monitor closely for bradycardia with concurrent use of carvedilol and clonidine.  She did have profound bradycardia with concurrent use of atenolol and clonidine in the past. I will see her back in 2 weeks to monitor this closely.  Coronary artery disease involving native coronary artery of native heart without angina pectoris Continue risk factor modification with BP and lipid lowering therapy. Continue aspirin 81 mg daily (does not need to take plavix), pitvastatin. She has not tolerated other statins in the past. She does not want to try injectables.  F/u in 2 weeks    Elder Negus, MD Pager:  [phone] Office: [phone]

## 2022-10-29 NOTE — Telephone Encounter (Signed)
Patient's BP/HR are well controlled on her current antihypertensive regimen.   30-day BP Avg: 127/78 mmHg 30-day HR Avg: 64 bpm  10/29/22 7:49 AM 155 / 98 54     10/28/22 12:44 PM 112 / 68 72     10/28/22 9:23 AM 137 / 83 66     10/27/22 3:13 PM 107 / 68 68    10/27/22 9:58 AM 129 / 81 59     10/27/22 3:03 AM 130 / 82 51    10/24/22 8:24 AM 122 / 76 67     10/23/22 4:41 PM 124 / 79 66     10/23/22 2:03 PM 134 / 81 70     10/23/22 12:02 PM 121 / 69 69     10/23/22 8:33 AM 137 / 81 59     10/22/22 8:40 PM 135 / 85 68     10/22/22 8:23 PM 133 / 83 67     10/21/22 10:34 PM 127 / 80 73     10/21/22 8:54 AM 117 / 76 55     10/20/22 10:13 PM 128 / 81 65     10/20/22 4:02 PM 124 / 76 73     10/20/22 2:13 PM 122 / 76 71     10/20/22 11:07 AM 121 / 77 64     10/20/22 10:42 AM 104 / 62 65

## 2022-10-30 ENCOUNTER — Telehealth: Payer: Self-pay

## 2022-10-30 NOTE — Telephone Encounter (Signed)
Patient called asking if the carvedilol with interfere with her asthma. Please advise

## 2022-10-31 MED ORDER — LABETALOL HCL 100 MG PO TABS: 100.0000 mg | ORAL_TABLET | Freq: Two times a day (BID) | ORAL | 3 refills | Status: DC

## 2022-10-31 NOTE — Telephone Encounter (Signed)
It could. I am not aware of Ms. Sobocinski having asthma. Please check. If yes, please change to labetalol 100 mg bid.  Thanks MJP

## 2022-10-31 NOTE — Telephone Encounter (Signed)
Patient is aware 

## 2022-11-05 ENCOUNTER — Telehealth: Payer: Self-pay

## 2022-11-05 ENCOUNTER — Ambulatory Visit: Payer: Medicare Other | Admitting: Cardiology

## 2022-11-05 NOTE — Telephone Encounter (Signed)
Patient called wanting you to give her a call back because she is wanting you to explain to her why she is taking the Labetalol and the clonidine at night. Patient mention it is too much medication for her bp and she thinks it is going to low it too much. Please call patient to explain.

## 2022-11-06 ENCOUNTER — Ambulatory Visit: Payer: Medicare Other | Admitting: Cardiology

## 2022-11-07 NOTE — Telephone Encounter (Signed)
Lets hold labetalol for now if patient is not comfortable. Monitor the blood pressure. Patient has appt with me on 6/6 and I wll discuss more in detail then.  Thanks MJP

## 2022-11-08 NOTE — Telephone Encounter (Signed)
Called patient to explain to her about the message above. Patient understood

## 2022-11-11 DIAGNOSIS — Z9622 Myringotomy tube(s) status: Secondary | ICD-10-CM | POA: Diagnosis not present

## 2022-11-11 DIAGNOSIS — H903 Sensorineural hearing loss, bilateral: Secondary | ICD-10-CM | POA: Diagnosis not present

## 2022-11-11 DIAGNOSIS — H6123 Impacted cerumen, bilateral: Secondary | ICD-10-CM | POA: Diagnosis not present

## 2022-11-12 ENCOUNTER — Telehealth: Payer: Self-pay | Admitting: Cardiology

## 2022-11-12 DIAGNOSIS — I10 Essential (primary) hypertension: Secondary | ICD-10-CM | POA: Diagnosis not present

## 2022-11-12 DIAGNOSIS — R001 Bradycardia, unspecified: Secondary | ICD-10-CM | POA: Diagnosis not present

## 2022-11-12 NOTE — Telephone Encounter (Signed)
ON-CALL CARDIOLOGY 11/12/22  Patient's name: Sara Brown.   MRN: [id number].    DOB: 03/17/1941 Primary care provider: Renford Dills, MD. Primary cardiologist: Dr. Truett Mainland  Interaction regarding this patient's care today: Patient called answering service with concerns for blood pressure reading of 158/82 and a pulse of 51..  I spoke to the patient she had already taken her evening dose of clonidine and her statin.  Clinically asymptomatic.  She is not due for any medications later this afternoon.  Impression:   ICD-10-CM   1. Essential hypertension  I10     2. Bradycardia  R00.1      Recommendations: Currently she is asymptomatic with regards to her bradycardia.  No changes were recommended at this time.  I have asked her to check her blood pressures and pulse prior to taking her morning medications and if the pulse is less than 60 bpm she can call the office for further direction.  She has an upcoming appointment with her primary cardiologist Dr. Rosemary Holms on November 14, 2022.  Telephone encounter total time: 5 minutes.   Tessa Lerner, [place], [facility]  Pager:  [phone] Office: [phone]

## 2022-11-13 DIAGNOSIS — I10 Essential (primary) hypertension: Secondary | ICD-10-CM | POA: Diagnosis not present

## 2022-11-14 ENCOUNTER — Encounter: Payer: Self-pay | Admitting: Cardiology

## 2022-11-14 ENCOUNTER — Ambulatory Visit: Payer: Medicare Other | Admitting: Cardiology

## 2022-11-14 VITALS — BP 145/74 | HR 71 | Ht 66.0 in | Wt 128.4 lb

## 2022-11-14 DIAGNOSIS — H903 Sensorineural hearing loss, bilateral: Secondary | ICD-10-CM | POA: Diagnosis not present

## 2022-11-14 DIAGNOSIS — R072 Precordial pain: Secondary | ICD-10-CM | POA: Diagnosis not present

## 2022-11-14 DIAGNOSIS — I10 Essential (primary) hypertension: Secondary | ICD-10-CM | POA: Diagnosis not present

## 2022-11-14 MED ORDER — NITROGLYCERIN 0.4 MG SL SUBL: 0.4000 mg | SUBLINGUAL_TABLET | SUBLINGUAL | 1 refills | Status: DC | PRN

## 2022-11-14 NOTE — Progress Notes (Signed)
Follow up visit  Subjective:   Sara Brown, female    DOB: 1940/11/21, 83 y.o.   MRN: [id number]   Chief Complaint  Patient presents with   Hypertension   Follow-up     82 y/o African-American female with hypertension, diet controlled diabetes, hyperlipidemia,  GERD and remote history of TIA, mild nonobstructive coronary artery disease (Cath 04/01/2018).  Intolerant to several medications-including but not limited to losartan, amlodipine, hydralazine, atenolol, nifedipine, reluctant to use carvedilol due to history of asthma.  At last visit, had started on carvedilol in place of nifedipine.  However, patient has history of asthma and she concerned about taking carvedilol, therefore it was stopped.  In the meantime, patient had 1 episode of chest pain while working outside in the yard, improved with rest.  He also reports "heavy breathing".  . Current Outpatient Medications:    albuterol (PROVENTIL HFA;VENTOLIN HFA) 108 (90 Base) MCG/ACT inhaler, Inhale 1 puff into the lungs every 4 (four) hours as needed for wheezing., Disp: , Rfl:    alendronate (FOSAMAX) 70 MG tablet, Take 70 mg by mouth every Saturday., Disp: , Rfl:    aspirin EC 81 MG tablet, Take 1 tablet (81 mg total) by mouth daily. Swallow whole., Disp: 90 tablet, Rfl: 3   Calcium Citrate-Vitamin D 315-250 MG-UNIT TABS, Take 2 tablets by mouth daily., Disp: , Rfl:    carboxymethylcellulose (REFRESH PLUS) 0.5 % SOLN, Place 1 drop into both eyes in the morning and at bedtime., Disp: , Rfl:    Cholecalciferol (VITAMIN D3) 10 MCG (400 UNIT) CAPS, Take 800 Units by mouth daily., Disp: , Rfl:    clobetasol cream (TEMOVATE) 0.05 %, APPLY TO AFFECTED AREA DAILY, Disp: , Rfl:    Clobetasol Prop Emollient Base (CLOBETASOL PROPIONATE E) 0.05 % emollient cream, Apply 1 application topically 2 (two) times daily as needed., Disp: 60 g, Rfl: 6   cloNIDine (CATAPRES) 0.1 MG tablet, TAKE 1 TABLET BY MOUTH AT BEDTIME , MAY TAKE  ADDITIONAL 1 TAB IF BLOOD PRESSURE IS HIGH (Patient taking differently: Take 0.1 mg by mouth 2 (two) times daily.), Disp: 180 tablet, Rfl: 1   Coenzyme Q10 (COQ10) 200 MG CAPS, Take 200 mg by mouth daily., Disp: , Rfl:    cycloSPORINE (RESTASIS) 0.05 % ophthalmic emulsion, Place 1 drop into both eyes 2 (two) times daily. , Disp: , Rfl:    fluticasone (FLONASE) 50 MCG/ACT nasal spray, Place 1 spray into both nostrils in the morning and at bedtime., Disp: , Rfl:    Fluticasone-Salmeterol (WIXELA INHUB IN), Inhale into the lungs., Disp: , Rfl:    Garlic 1000 MG CAPS, Take 1,000 mg by mouth daily as needed (high blood pressure)., Disp: , Rfl:    Magnesium 250 MG TABS, Take 250 mg by mouth daily., Disp: , Rfl:    Multiple Vitamins-Minerals (CENTRUM SILVER) CHEW, See admin instructions., Disp: , Rfl:    NON FORMULARY, Garlique 1800, Disp: , Rfl:    NON FORMULARY, Take 1 tablet by mouth daily. Bone up takes one tablet daily, Disp: , Rfl:    Pitavastatin Calcium (LIVALO) 2 MG TABS, TAKE 1 TABLET BY MOUTH EVERY DAY, Disp: 336 tablet, Rfl: 1   polyethylene glycol (MIRALAX / GLYCOLAX) packet, Take 17 g by mouth daily as needed for moderate constipation., Disp: , Rfl:    pyridOXINE (VITAMIN B-6) 100 MG tablet, Take 100 mg by mouth daily. , Disp: , Rfl:    spironolactone (ALDACTONE) 50 MG tablet, Take 50 mg  by mouth daily. , Disp: , Rfl:    labetalol (NORMODYNE) 100 MG tablet, Take 1 tablet (100 mg total) by mouth 2 (two) times daily. (Patient not taking: Reported on 11/14/2022), Disp: 180 tablet, Rfl: 3  Cardiovascular & other pertient studies:  EKG 01/07/2022: Sinus rhythm 61 bpm  Low voltage Nonspecific T-abnormality  Renal artery duplex  12/14/2020:  No evidence of renal artery occlusive disease in either renal artery.  Normal intrarenal vascular perfusion is noted in both kidneys.  Renal length is within normal limits for both kidneys.  Diffuse plaque observed in the abdominal aorta. Normal abdominal  aorta  flow velocities noted.  Coronary angiogram 04/01/2018: LM: Normal LAD: Prox-mid 40% stenosis. iFR 0.99 (Physiologically nonsignificant) LCx: Dominant. Small branches with mild disease RCA: Nondominant. Normal   LVEDP mildly elevated.   Carotid artery duplex 02/23/2018: No hemodynamically significant arterial disease in the internal carotid artery bilaterally. No significant plaque burden. Antegrade right vertebral artery flow. Antegrade left vertebral artery flow.  Echocardiogram 10/29/2017: Left ventricle cavity is normal in size. Mild concentric hypertrophy of the left ventricle. Normal global wall motion. Normal diastolic filling pattern. Calculated EF 69%. No significant valvular abnormality.  Insignificant pericardial effusion.  Recent labs: 11/19/2021: Glucose 77, BUN/Cr 19/0.91. EGFR 63. Na/K 140/4.2. Rest of the CMP normal H/H 12/37. MCV 88. Platelets 212 HbA1C 6.2% Chol 172, TG 57, HDL 65, LDL 96 TSH 2.0 normal  Review of Systems  Cardiovascular:  Positive for chest pain. Negative for dyspnea on exertion, leg swelling, palpitations and syncope.  Respiratory:  Positive for shortness of breath.   Neurological:  Negative for headaches and paresthesias.       Vitals:   11/14/22 0918  BP: (!) 145/74  Pulse: 71  SpO2: 98%      There is no height or weight on file to calculate BMI. Filed Weights   11/14/22 0918  Weight: 128 lb 6.4 oz (58.2 kg)     Objective:   Physical Exam Vitals and nursing note reviewed.  Constitutional:      Appearance: She is well-developed.  Neck:     Vascular: No JVD.  Cardiovascular:     Rate and Rhythm: Normal rate and regular rhythm.     Pulses: Intact distal pulses.     Heart sounds: Normal heart sounds. No murmur heard. Pulmonary:     Effort: Pulmonary effort is normal.     Breath sounds: Normal breath sounds. No wheezing or rales.  Musculoskeletal:     Right lower leg: No edema.     Left lower leg: No edema.         Assessment & Recommendations:   82 y/o African-American female with hypertension, diet controlled diabetes, hyperlipidemia,  GERD and remote history of TIA, mild nonobstructive coronary artery disease (Cath 04/01/2018).  Hypertension: Intolerant to several medications-including but not limited to losartan, amlodipine, hydralazine, atenolol, nifedipine, reluctant to use carvedilol due to history of asthma. Continue spironolactone 50 mg daily, clonidine 0.1 mg at bedtime with additional 1/2-1 pill as needed . She is reluctant to add any new medication at this time, especially in light of her recent chest pain.  Will proceed with exercise nuclear stress test and echocardiogram.  Options for additional antihypertensive agent include labetalol, and possibly Imdur if she continues to have anginal-like symptoms.    Coronary artery disease: Nonobstructive on cath in 2019. Recent possible anginal symptoms, ordered exercise nuclear stress test and echocardiogram.  Continue risk factor modification with BP and lipid lowering therapy.  Continue aspirin 81 mg daily (does not need to take plavix), pitvastatin. She has not tolerated other statins in the past. She does not want to try injectables.   F/u after tests     Elder Negus, MD Pager: [phone] Office: [phone]

## 2022-11-18 ENCOUNTER — Ambulatory Visit: Payer: Medicare Other

## 2022-11-19 ENCOUNTER — Encounter: Payer: Self-pay | Admitting: Podiatry

## 2022-11-19 ENCOUNTER — Ambulatory Visit: Payer: Medicare Other | Admitting: Podiatry

## 2022-11-19 VITALS — BP 122/60 | HR 69 | Temp 97.2°F

## 2022-11-19 DIAGNOSIS — M79674 Pain in right toe(s): Secondary | ICD-10-CM

## 2022-11-19 DIAGNOSIS — M2041 Other hammer toe(s) (acquired), right foot: Secondary | ICD-10-CM

## 2022-11-19 DIAGNOSIS — B351 Tinea unguium: Secondary | ICD-10-CM | POA: Diagnosis not present

## 2022-11-19 DIAGNOSIS — M2042 Other hammer toe(s) (acquired), left foot: Secondary | ICD-10-CM | POA: Diagnosis not present

## 2022-11-19 DIAGNOSIS — E1142 Type 2 diabetes mellitus with diabetic polyneuropathy: Secondary | ICD-10-CM

## 2022-11-19 DIAGNOSIS — L84 Corns and callosities: Secondary | ICD-10-CM | POA: Diagnosis not present

## 2022-11-19 DIAGNOSIS — M79675 Pain in left toe(s): Secondary | ICD-10-CM | POA: Diagnosis not present

## 2022-11-19 NOTE — Progress Notes (Signed)
Subjective:  Patient ID: Sara Brown, female    DOB: 1941/05/30,  MRN: [id number]  Sara Brown presents to clinic today for at risk foot care with history of diabetic neuropathy and callus(es) right foot and painful thick toenails that are difficult to trim. Painful toenails interfere with ambulation. Aggravating factors include wearing enclosed shoe gear. Pain is relieved with periodic professional debridement. Painful calluses are aggravated when weightbearing with and without shoegear. Pain is relieved with periodic professional debridement.  Chief Complaint  Patient presents with   Routine Foot Care     3 month RFC     New problem(s): None.   PCP is Renford Dills, MD.  Dr. Dorisann Frames is her Endocrinologist.  Allergies  Allergen Reactions   Amlodipine Besylate     Other reaction(s): rash and hives   Hydralazine     Other reaction(s): rash   Losartan Potassium     Other reaction(s): rash and HIves   Naproxen Other (See Comments)    Patient preference  Other reaction(s): upset stomach   Prednisone Other (See Comments)    Patient Preference  Other reaction(s): upset stomach   Amlodipine Hives   Isordil [Isosorbide Nitrate] Rash   Losartan Rash    Review of Systems: Negative except as noted in the HPI.  Objective: No changes noted in today's physical examination. Vitals:   11/19/22 0840  BP: 122/60  Pulse: 69  Temp: (!) 97.2 F (36.2 C)  SpO2: 97%   Sara Brown is a pleasant 82 y.o. female WD, WN in NAD. AAO x 3.  Vascular Examination: Capillary refill time immediate b/l. Vascular status intact b/l with palpable pedal pulses. Pedal hair sparse b/l. No edema. No pain with calf compression b/l. Skin temperature gradient WNL b/l. No cyanosis or clubbing noted b/l LE.  Neurological Examination: Sensation grossly intact b/l with 10 gram monofilament. Vibratory sensation intact b/l. Pt has subjective symptoms of  neuropathy.  Dermatological Examination: Pedal integument with normal turgor, texture and tone BLE. No open wounds b/l LE. No interdigital macerations noted b/l LE.   Toenails 1-5 b/l elongated, discolored, dystrophic, thickened, crumbly with subungual debris and tenderness to dorsal palpation.   Hyperkeratotic lesion(s) submet head 1 right foot.  No erythema, no edema, no drainage, no fluctuance.  Musculoskeletal Examination: Muscle strength 5/5 to all lower extremity muscle groups bilaterally. HAV with bunion deformity noted b/l LE. Hammertoe deformity noted 2-5 b/l. Patient ambulates independent of any assistive aids.  Radiographs: None  Assessment/Plan: 1. Pain due to onychomycosis of toenails of both feet   2. Callus   3. Hammer toes of both feet   4. Diabetic peripheral neuropathy associated with type 2 diabetes mellitus (HCC)   Dispensed one pair of extra depth shoes and 3 pairs of custom-molded tonal contact multidensity Plastazote/EVA inserts that were made from a positive model of the patient's foot. Custom molded inserts were required to achieve and maintain total contact with the plantar aspect of the patient's foot for the life of the device and to prevent tissue damage. Three pairs of inserts are required in this patient due to bottoming out and loss of protection after four months of wear. The shoes fit well and the inserts achieve total contact with the plantar surface of the patient's foot. A statement of certifying physician is on file in the patient's chart that documents the medical necessity for footwear and/or inserts. The patient was given a copy of the supplier standards, the return policy and new  shoe break-in instructions. The patient was advised to wear the shoes at home for only one hour on the first day and to check their feet for any sores or irritations. The patient will call if any problems arise. -Consent given for treatment as described below: -Examined  patient. -Continue foot and shoe inspections daily. Monitor blood glucose per PCP/Endocrinologist's recommendations. -Toenails 1-5 b/l were debrided in length and girth with sterile nail nippers and dremel without iatrogenic bleeding.  -Callus(es) submet head 1 right foot pared utilizing sterile scalpel blade without complication or incident. Total number debrided =1. -Patient/POA to call should there be question/concern in the interim.   Return in about 9 weeks (around 01/21/2023).  Freddie Breech, DPM

## 2022-11-19 NOTE — Patient Instructions (Addendum)
Drew Double Depth shoes.They can be purchased online at [email or web address]

## 2022-11-25 ENCOUNTER — Ambulatory Visit: Payer: Medicare Other

## 2022-11-25 DIAGNOSIS — R072 Precordial pain: Secondary | ICD-10-CM | POA: Diagnosis not present

## 2022-12-06 ENCOUNTER — Ambulatory Visit: Payer: Medicare Other

## 2022-12-06 DIAGNOSIS — I10 Essential (primary) hypertension: Secondary | ICD-10-CM

## 2022-12-06 DIAGNOSIS — R072 Precordial pain: Secondary | ICD-10-CM | POA: Diagnosis not present

## 2022-12-10 ENCOUNTER — Telehealth: Payer: Self-pay

## 2022-12-10 NOTE — Telephone Encounter (Signed)
Patient called for echo results.

## 2022-12-10 NOTE — Telephone Encounter (Signed)
Normal pumping function of the heart. No severe heart valve abnormalities noted. Will discuss more during upcoming office visit on 7/12.  Regards, Dr. Rosemary Holms

## 2022-12-10 NOTE — Telephone Encounter (Signed)
Lvm for pt

## 2022-12-19 DIAGNOSIS — H6522 Chronic serous otitis media, left ear: Secondary | ICD-10-CM | POA: Diagnosis not present

## 2022-12-19 NOTE — Progress Notes (Deleted)
Follow up visit  Subjective:   Sara Brown, female    DOB: 09-08-1940, 82 y.o.   MRN: [id number]   No chief complaint on file.    82 y/o African-American female with hypertension, diet controlled diabetes, hyperlipidemia,  GERD and remote history of TIA, mild nonobstructive coronary artery disease (Cath 04/01/2018).  Intolerant to several medications-including but not limited to losartan, amlodipine, hydralazine, atenolol, nifedipine, reluctant to use carvedilol due to history of asthma.  At last visit, had started on carvedilol in place of nifedipine.  However, patient has history of asthma and she concerned about taking carvedilol, therefore it was stopped.  In the meantime, patient had 1 episode of chest pain while working outside in the yard, improved with rest.  He also reports "heavy breathing".  . Current Outpatient Medications:    albuterol (PROVENTIL HFA;VENTOLIN HFA) 108 (90 Base) MCG/ACT inhaler, Inhale 1 puff into the lungs every 4 (four) hours as needed for wheezing., Disp: , Rfl:    alendronate (FOSAMAX) 70 MG tablet, Take 70 mg by mouth every Saturday., Disp: , Rfl:    aspirin EC 81 MG tablet, Take 1 tablet (81 mg total) by mouth daily. Swallow whole., Disp: 90 tablet, Rfl: 3   Calcium Citrate-Vitamin D 315-250 MG-UNIT TABS, Take 2 tablets by mouth daily., Disp: , Rfl:    carboxymethylcellulose (REFRESH PLUS) 0.5 % SOLN, Place 1 drop into both eyes in the morning and at bedtime., Disp: , Rfl:    Cholecalciferol (VITAMIN D3) 10 MCG (400 UNIT) CAPS, Take 800 Units by mouth daily., Disp: , Rfl:    clobetasol cream (TEMOVATE) 0.05 %, APPLY TO AFFECTED AREA DAILY, Disp: , Rfl:    Clobetasol Prop Emollient Base (CLOBETASOL PROPIONATE E) 0.05 % emollient cream, Apply 1 application topically 2 (two) times daily as needed., Disp: 60 g, Rfl: 6   cloNIDine (CATAPRES) 0.1 MG tablet, TAKE 1 TABLET BY MOUTH AT BEDTIME , MAY TAKE ADDITIONAL 1 TAB IF BLOOD PRESSURE IS HIGH  (Patient taking differently: Take 0.1 mg by mouth 2 (two) times daily.), Disp: 180 tablet, Rfl: 1   Coenzyme Q10 (COQ10) 200 MG CAPS, Take 200 mg by mouth daily., Disp: , Rfl:    cycloSPORINE (RESTASIS) 0.05 % ophthalmic emulsion, Place 1 drop into both eyes 2 (two) times daily. , Disp: , Rfl:    fluticasone (FLONASE) 50 MCG/ACT nasal spray, Place 1 spray into both nostrils in the morning and at bedtime., Disp: , Rfl:    Fluticasone-Salmeterol (WIXELA INHUB IN), Inhale into the lungs., Disp: , Rfl:    Garlic 1000 MG CAPS, Take 1,000 mg by mouth daily as needed (high blood pressure)., Disp: , Rfl:    labetalol (NORMODYNE) 100 MG tablet, Take 1 tablet (100 mg total) by mouth 2 (two) times daily., Disp: 180 tablet, Rfl: 3   Magnesium 250 MG TABS, Take 250 mg by mouth daily., Disp: , Rfl:    Multiple Vitamins-Minerals (CENTRUM SILVER) CHEW, See admin instructions., Disp: , Rfl:    nitroGLYCERIN (NITROSTAT) 0.4 MG SL tablet, Place 1 tablet (0.4 mg total) under the tongue every 5 (five) minutes as needed for chest pain., Disp: 30 tablet, Rfl: 1   NON FORMULARY, Garlique 1800, Disp: , Rfl:    NON FORMULARY, Take 1 tablet by mouth daily. Bone up takes one tablet daily, Disp: , Rfl:    Pitavastatin Calcium (LIVALO) 2 MG TABS, TAKE 1 TABLET BY MOUTH EVERY DAY (Patient taking differently: Use as directed 2 mg in the  mouth or throat at bedtime.), Disp: 336 tablet, Rfl: 1   polyethylene glycol (MIRALAX / GLYCOLAX) packet, Take 17 g by mouth daily as needed for moderate constipation., Disp: , Rfl:    pyridOXINE (VITAMIN B-6) 100 MG tablet, Take 100 mg by mouth daily. , Disp: , Rfl:    spironolactone (ALDACTONE) 50 MG tablet, Take 50 mg by mouth daily. , Disp: , Rfl:   Cardiovascular & other pertient studies:  EKG 01/07/2022: Sinus rhythm 61 bpm  Low voltage Nonspecific T-abnormality  Renal artery duplex  12/14/2020:  No evidence of renal artery occlusive disease in either renal artery.  Normal intrarenal  vascular perfusion is noted in both kidneys.  Renal length is within normal limits for both kidneys.  Diffuse plaque observed in the abdominal aorta. Normal abdominal aorta  flow velocities noted.  Coronary angiogram 04/01/2018: LM: Normal LAD: Prox-mid 40% stenosis. iFR 0.99 (Physiologically nonsignificant) LCx: Dominant. Small branches with mild disease RCA: Nondominant. Normal   LVEDP mildly elevated.   Carotid artery duplex 02/23/2018: No hemodynamically significant arterial disease in the internal carotid artery bilaterally. No significant plaque burden. Antegrade right vertebral artery flow. Antegrade left vertebral artery flow.  Echocardiogram 10/29/2017: Left ventricle cavity is normal in size. Mild concentric hypertrophy of the left ventricle. Normal global wall motion. Normal diastolic filling pattern. Calculated EF 69%. No significant valvular abnormality.  Insignificant pericardial effusion.  Recent labs: 11/19/2021: Glucose 77, BUN/Cr 19/0.91. EGFR 63. Na/K 140/4.2. Rest of the CMP normal H/H 12/37. MCV 88. Platelets 212 HbA1C 6.2% Chol 172, TG 57, HDL 65, LDL 96 TSH 2.0 normal  Review of Systems  Cardiovascular:  Positive for chest pain. Negative for dyspnea on exertion, leg swelling, palpitations and syncope.  Respiratory:  Positive for shortness of breath.   Neurological:  Negative for headaches and paresthesias.       There were no vitals filed for this visit.     There is no height or weight on file to calculate BMI. There were no vitals filed for this visit.    Objective:   Physical Exam Vitals and nursing note reviewed.  Constitutional:      Appearance: She is well-developed.  Neck:     Vascular: No JVD.  Cardiovascular:     Rate and Rhythm: Normal rate and regular rhythm.     Pulses: Intact distal pulses.     Heart sounds: Normal heart sounds. No murmur heard. Pulmonary:     Effort: Pulmonary effort is normal.     Breath sounds: Normal  breath sounds. No wheezing or rales.  Musculoskeletal:     Right lower leg: No edema.     Left lower leg: No edema.        Assessment & Recommendations:   82 y/o African-American female with hypertension, diet controlled diabetes, hyperlipidemia,  GERD and remote history of TIA, mild nonobstructive coronary artery disease (Cath 04/01/2018).  Hypertension: Intolerant to several medications-including but not limited to losartan, amlodipine, hydralazine, atenolol, nifedipine, reluctant to use carvedilol due to history of asthma. Continue spironolactone 50 mg daily, clonidine 0.1 mg at bedtime with additional 1/2-1 pill as needed . She is reluctant to add any new medication at this time, especially in light of her recent chest pain.  Will proceed with exercise nuclear stress test and echocardiogram.  Options for additional antihypertensive agent include labetalol, and possibly Imdur if she continues to have anginal-like symptoms.    Coronary artery disease: Nonobstructive on cath in 2019. Recent possible anginal symptoms, ordered  exercise nuclear stress test and echocardiogram.  Continue risk factor modification with BP and lipid lowering therapy. Continue aspirin 81 mg daily (does not need to take plavix), pitvastatin. She has not tolerated other statins in the past. She does not want to try injectables.   F/u after tests     Elder Negus, MD Pager: [phone] Office: [phone]

## 2022-12-20 ENCOUNTER — Ambulatory Visit: Payer: Medicare Other | Admitting: Cardiology

## 2023-01-21 DIAGNOSIS — H6522 Chronic serous otitis media, left ear: Secondary | ICD-10-CM | POA: Diagnosis not present

## 2023-01-21 DIAGNOSIS — Z9622 Myringotomy tube(s) status: Secondary | ICD-10-CM | POA: Diagnosis not present

## 2023-01-22 ENCOUNTER — Ambulatory Visit (INDEPENDENT_AMBULATORY_CARE_PROVIDER_SITE_OTHER): Payer: Medicare Other | Admitting: Podiatry

## 2023-01-22 ENCOUNTER — Encounter: Payer: Self-pay | Admitting: Podiatry

## 2023-01-22 DIAGNOSIS — Z23 Encounter for immunization: Secondary | ICD-10-CM | POA: Diagnosis not present

## 2023-01-22 DIAGNOSIS — M79675 Pain in left toe(s): Secondary | ICD-10-CM

## 2023-01-22 DIAGNOSIS — B351 Tinea unguium: Secondary | ICD-10-CM

## 2023-01-22 DIAGNOSIS — E1142 Type 2 diabetes mellitus with diabetic polyneuropathy: Secondary | ICD-10-CM

## 2023-01-22 DIAGNOSIS — M79674 Pain in right toe(s): Secondary | ICD-10-CM

## 2023-01-22 DIAGNOSIS — L84 Corns and callosities: Secondary | ICD-10-CM | POA: Diagnosis not present

## 2023-01-22 NOTE — Progress Notes (Addendum)
Subjective:  Patient ID: Sara Brown, female    DOB: [birth year],   MRN: [id number]  No chief complaint on file.   82 y.o. female presents for concern of thickened elongated and painful nails that are difficult to trim. Requesting to have them trimmed today. Relates burning and tingling in their feet. Patient is diabetic and last A1c was  Lab Results  Component Value Date   HGBA1C 5.9 (H) 11/14/2020   .   PCP:  Renford Dills, MD    . Denies any other pedal complaints. Denies n/v/f/c.   Past Medical History:  Diagnosis Date   Age-related osteoporosis without current pathological fracture 08/18/2020   Allergic rhinitis    Angina at rest 02/09/2018   Arthritis    "fingers, knees" (02/09/2018)   Asthma    "stopped taking RX after dr said I don't have this" (02/09/2018)   Basal cell carcinoma    Bradycardia 02/09/2018   Carotid stenosis    ICA(L)   Chest pain due to coronary artery disease 02/09/2018   Chronic bronchitis (HCC)    Chronic constipation 08/18/2020   Complication of anesthesia    "felt the cut w/one of my c-sections" (02/09/2018)   Coronary artery disease involving native coronary artery of native heart without angina pectoris 09/17/2018   Diabetes mellitus type 2, controlled 03/31/2018   EEG abnormal    Essential hypertension 03/31/2018   Generalized anxiety disorder    GERD (gastroesophageal reflux disease)    Hardening of the aorta (main artery of the heart)    Headache    "years ago; before menopause" (02/09/2018)   Hirsutism 06/12/2017   Hyperglycemia due to type 2 diabetes mellitus 03/09/2021   Hyperlipidemia    Hyperpigmentation 02/13/2017   Hypertensive retinopathy    Irritable bowel syndrome    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   Mixed hyperlipidemia 12/29/2020   Myringotomy tube status 04/03/2021   Neck mass 01/17/2017    Neuropathic pain 03/09/2021   OA (osteoarthritis)    Osteoporosis    Palpitations 03/16/2021   Paresthesias 09/05/2020   Personal history of colonic polyps    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   Pure hypercholesterolemia 08/18/2020   Shortness of breath 07/30/2007   Skin laxity 06/12/2017   TIA (transient ischemic attack) ?2005   Vitamin D deficiency     Objective:  Physical Exam: Vascular: DP/PT pulses 2/4 bilateral. CFT <3 seconds. Absent hair growth on digits. Edema noted to bilateral lower extremities. Xerosis noted bilaterally.  Skin. No lacerations or abrasions bilateral feet. Nails 1-5 bilateral  are thickened discolored and elongated with subungual debris. Hyperkeratotic tissue noted to sub metatarsal first head on right.  Musculoskeletal: MMT 5/5 bilateral lower extremities in DF, PF, Inversion and Eversion. Deceased ROM in DF of ankle joint.  Neurological: Sensation intact to light touch. Protective sensation diminished bilateral.    Assessment:   1. Pain due to onychomycosis of toenails of both feet   2. Diabetic peripheral neuropathy associated with type 2 diabetes mellitus (HCC)      Plan:  Patient was evaluated and treated and all questions answered. -Hyperkeratotic tissue right foot sub first metatarsal head debrided without incident with chisel.  -Mechanically debrided all nails 1-5 bilateral using sterile nail nipper and filed with dremel without incident  -Answered all patient questions -Patient to return  in 3 months   for at risk foot  care -Patient advised to call the office if any problems or questions arise in the meantime.   Louann Sjogren, DPM

## 2023-01-23 ENCOUNTER — Other Ambulatory Visit: Payer: Self-pay | Admitting: Cardiology

## 2023-02-02 ENCOUNTER — Other Ambulatory Visit: Payer: Self-pay | Admitting: Cardiology

## 2023-02-02 DIAGNOSIS — I1A0 Resistant hypertension: Secondary | ICD-10-CM

## 2023-02-03 DIAGNOSIS — H35363 Drusen (degenerative) of macula, bilateral: Secondary | ICD-10-CM | POA: Diagnosis not present

## 2023-02-03 DIAGNOSIS — H18513 Endothelial corneal dystrophy, bilateral: Secondary | ICD-10-CM | POA: Diagnosis not present

## 2023-02-03 DIAGNOSIS — H35033 Hypertensive retinopathy, bilateral: Secondary | ICD-10-CM | POA: Diagnosis not present

## 2023-02-03 DIAGNOSIS — E119 Type 2 diabetes mellitus without complications: Secondary | ICD-10-CM | POA: Diagnosis not present

## 2023-02-03 DIAGNOSIS — H40023 Open angle with borderline findings, high risk, bilateral: Secondary | ICD-10-CM | POA: Diagnosis not present

## 2023-02-19 DIAGNOSIS — M1990 Unspecified osteoarthritis, unspecified site: Secondary | ICD-10-CM | POA: Diagnosis not present

## 2023-02-19 DIAGNOSIS — M25511 Pain in right shoulder: Secondary | ICD-10-CM | POA: Diagnosis not present

## 2023-02-19 NOTE — Progress Notes (Unsigned)
NEUROLOGY FOLLOW UP OFFICE NOTE  Sara Brown [id number] 01-13-1941  HISTORY OF PRESENT ILLNESS: I had the pleasure of seeing Sara Brown in follow-up in the neurology clinic on 02/20/2023.  The patient was last seen on *** and is accompanied by *** today.  Records and images were personally reviewed where available.  On her last visit, she continued to report odd sensations in her head, "hurts, but not like a regular headache." She was started on low dose Gabapentin for symptomatic treatment.   I had the pleasure of seeing Sara Brown in follow-up in the neurology clinic on 08/20/2022.  The patient was last seen 5 months ago. She was being seen in our office due to concern for seizures due to recurrent intermittent episodes of paresthesias on one side of her body, initially on the right, then other times on the left. Her routine EEGs and her 24-hour EEG in 03/2021 showed occasional focal slowing over the left temporal region, no epileptiform discharges seen. Repeat brain MRIs have not shown any acute changes, no structural abnormality on the left temporal region. She has weaned off Levetiracetam with no seizures or seizure-like symptoms. She presents today for new symptoms of headache. On review of records, when she was seeing Dr. Frances Furbish in 2021, she was reporting fairly short-lived headaches in the top of the head, sometimes in the back or parietal areas. ESR was normal, brain MRI with and without contrast done for this symptom no acute changes, there was mild chronic microvascular disease.   She states that she is having that feeling again over the vertex and temples. She has difficulty describing the sensation but it bothers her, she is "feeling it in there," there is no tenderness but it hurts "not like a regular headache. She has to go to bed and be still when she feels it. After she sleeps for a few hours, she wakes up feeling fine. She recalls an episode 2 Saturdays ago where it  was bothering her so bad she had to go to bed, sometimes it may come back the next day. Last week she noticed it several times, "it was awful." She went to the ER for abdominal pain 4 days ago and had a normal head CT. She clears her sinuses a lot and has post-nasal drip but denies any congestion. She endorses a stressful situation ongoing. She does not know how much sleep she gets. She also reports cramps in her legs making her scream out. She has sharp pains in her toes L>R.   History on Initial Assessment 03/01/2021: This is an 82 year old right-handed woman with a history of hypertension, hyperlipidemia, diabetes, neuropathy, left carotid stenosis, presenting for second opinion regarding seizures. She had been seeing neurologist Dr. Frances Furbish since 2016 for several year history of intermittent abnormal sensations in her right face, right arm, pulling sensation in right arm and shoulders. Work-up in 2016 showed a normal EEG and unremarkable brain MRI. She was in the ER in 02/2018 for facial paresthesias and discomfort, at that time she reported symptoms on the left arm and face, as well as tongue. Head CT no acute changes. He had a repeat brain MRI in 1/202 with no acute changes, mild chronic microvascular disease. She was in the ER in 08/2020 due to elevated BP, she reported symptoms on the left side of her face, lip, and tongue. She also felt an abnormal sensation in her left leg like something was moving, making her feel as though she could  not walk, bu tin the ER, she reported it may have occurred on the right leg but not as pronounced on the left. MRI brain and MRA head and neck in 08/2020 did not show any acute changes or significant stenosis. EEG in 08/2020 reported focal slowing over the left temporal region. She was discharged home on Levetiracetam 500mg  BID. She was back in the ER on 11/13/20 for sudden-onset right-sided numbness involving the forehead, face, lip, arm and leg. She also reported clumsiness and  heaviness in the right leg. Another brain MRI done did not show any acute changes, MRA no significant stenosis. EEG again reported left temporal slowing. Levetiracetam was continued and she was advised to take DAPT with aspirin and Plavix for 3 weeks, the Plavix thereafter.   She also notes that when her BP goes too high or low, she gets symptoms on the side of her face. That day in June, she felt it on the right side, with tightening or heaviness in her tongue and lip, no physical changes visible to her husband. Her husband denies any staring/unresponsive episodes. She states her tongue is heavy right now because her BP is low. She had also been reporting headaches to Dr. Frances Furbish. She states the pain on the top her her head has not been recent, she cannot recall the last time she had a headache. She states it is not a headache but a stabbing kind of pain. She feels dizzy when her BP is off. She is asking about her memory, she reports being tested and score was 23/30. She is usually pretty good with remembering her medications. She uses the computer frequently and denies forgetting passwords. She has not been driving, but was not getting lost when she drove. She "probably does not sleep enough."  Her brother had epilepsy. Otherwise she had a normal birth and early development.  There is no history of febrile convulsions, CNS infections such as meningitis/encephalitis, significant traumatic brain injury, neurosurgical procedures.  Diagnostic Data: 24-hour EEG in 03/2021 showed occasional left temporal slowing, no epileptiform discharges. Episodes of chest sensations and heavy breathing did not show any EEG correlate.   Neuropsychological testing in 08/2021 with current test scores did not indicate a diagnosis of a cognitive disorder. There was an isolated impairment across a line orientation task, which may be due to vision changes. Vascular issues may also be contributing. Testing did not lateralize to the left  hemisphere. Psychiatric distress, various stressors, chronic pain, and sleep disturbances could also create inefficiencies.     Lab Results  Component Value Date   TSH 3.258 09/05/2020   No results found for: VITAMINB12  Lab Results  Component Value Date   HGBA1C 5.9 (H) 11/14/2020    PAST MEDICAL HISTORY: Past Medical History:  Diagnosis Date   Age-related osteoporosis without current pathological fracture 08/18/2020   Allergic rhinitis    Angina at rest 02/09/2018   Arthritis    "fingers, knees" (02/09/2018)   Asthma    "stopped taking RX after dr said I don't have this" (02/09/2018)   Basal cell carcinoma    Bradycardia 02/09/2018   Carotid stenosis    ICA(L)   Chest pain due to coronary artery disease 02/09/2018   Chronic bronchitis (HCC)    Chronic constipation 08/18/2020   Complication of anesthesia    "felt the cut w/one of my c-sections" (02/09/2018)   Coronary artery disease involving native coronary artery of native heart without angina pectoris 09/17/2018   Diabetes mellitus type 2,  controlled 03/31/2018   EEG abnormal    Essential hypertension 03/31/2018   Generalized anxiety disorder    GERD (gastroesophageal reflux disease)    Hardening of the aorta (main artery of the heart)    Headache    "years ago; before menopause" (02/09/2018)   Hirsutism 06/12/2017   Hyperglycemia due to type 2 diabetes mellitus 03/09/2021   Hyperlipidemia    Hyperpigmentation 02/13/2017   Hypertensive retinopathy    Irritable bowel syndrome    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   Mixed hyperlipidemia 12/29/2020   Myringotomy tube status 04/03/2021   Neck mass 01/17/2017   Neuropathic pain 03/09/2021   OA (osteoarthritis)    Osteoporosis    Palpitations 03/16/2021   Paresthesias 09/05/2020   Personal history of colonic polyps    Presbycusis of both ears  02/21/2016   Pure hypercholesterolemia 08/18/2020   Shortness of breath 07/30/2007   Skin laxity 06/12/2017   TIA (transient ischemic attack) ?2005   Vitamin D deficiency     MEDICATIONS: Current Outpatient Medications on File Prior to Visit  Medication Sig Dispense Refill   albuterol (PROVENTIL HFA;VENTOLIN HFA) 108 (90 Base) MCG/ACT inhaler Inhale 1 puff into the lungs every 4 (four) hours as needed for wheezing.     alendronate (FOSAMAX) 70 MG tablet Take 70 mg by mouth every Saturday.     aspirin EC 81 MG tablet Take 1 tablet (81 mg total) by mouth daily. Swallow whole. 90 tablet 3   Calcium Citrate-Vitamin D 315-250 MG-UNIT TABS Take 2 tablets by mouth daily.     carboxymethylcellulose (REFRESH PLUS) 0.5 % SOLN Place 1 drop into both eyes in the morning and at bedtime.     Cholecalciferol (VITAMIN D3) 10 MCG (400 UNIT) CAPS Take 800 Units by mouth daily.     clobetasol cream (TEMOVATE) 0.05 % APPLY TO AFFECTED AREA DAILY     Clobetasol Prop Emollient Base (CLOBETASOL PROPIONATE E) 0.05 % emollient cream Apply 1 application topically 2 (two) times daily as needed. 60 g 6   cloNIDine (CATAPRES) 0.1 MG tablet Take 1 tablet (0.1 mg total) by mouth 2 (two) times daily. 180 tablet 2   Coenzyme Q10 (COQ10) 200 MG CAPS Take 200 mg by mouth daily.     cycloSPORINE (RESTASIS) 0.05 % ophthalmic emulsion Place 1 drop into both eyes 2 (two) times daily.      fluticasone (FLONASE) 50 MCG/ACT nasal spray Place 1 spray into both nostrils in the morning and at bedtime.     Fluticasone-Salmeterol (WIXELA INHUB IN) Inhale into the lungs.     Garlic 1000 MG CAPS Take 1,000 mg by mouth daily as needed (high blood pressure).     labetalol (NORMODYNE) 100 MG tablet Take 1 tablet (100 mg total) by mouth 2 (two) times daily. 180 tablet 3   Magnesium 250 MG TABS Take 250 mg by mouth daily.     Multiple Vitamins-Minerals (CENTRUM SILVER) CHEW See admin instructions.     nitroGLYCERIN (NITROSTAT) 0.4 MG SL  tablet Place 1 tablet (0.4 mg total) under the tongue every 5 (five) minutes as needed for chest pain. 30 tablet 1   NON FORMULARY Garlique 1800     NON FORMULARY Take 1 tablet by mouth daily. Bone up takes one tablet daily     Pitavastatin Calcium (LIVALO) 2 MG TABS TAKE 1 TABLET BY MOUTH EVERY DAY (Patient taking  differently: Use as directed 2 mg in the mouth or throat at bedtime.) 336 tablet 1   polyethylene glycol (MIRALAX / GLYCOLAX) packet Take 17 g by mouth daily as needed for moderate constipation.     pyridOXINE (VITAMIN B-6) 100 MG tablet Take 100 mg by mouth daily.      spironolactone (ALDACTONE) 50 MG tablet Take 50 mg by mouth daily.      No current facility-administered medications on file prior to visit.    ALLERGIES: Allergies  Allergen Reactions   Amlodipine Besylate     Other reaction(s): rash and hives   Hydralazine     Other reaction(s): rash   Losartan Potassium     Other reaction(s): rash and HIves   Naproxen Other (See Comments)    Patient preference  Other reaction(s): upset stomach   Prednisone Other (See Comments)    Patient Preference  Other reaction(s): upset stomach   Amlodipine Hives   Isordil [Isosorbide Nitrate] Rash   Losartan Rash    FAMILY HISTORY: Family History  Problem Relation Age of Onset   Diabetes Mellitus II Mother    Osteoporosis Mother    Hypertension Mother    Diabetes Mellitus II Father    CVA Sister    CAD Sister    Hypertension Sister    Osteoarthritis Sister    Alcoholism Brother    Epilepsy Brother    Breast cancer Cousin 78   Dementia Cousin        suspected; never confirmed    SOCIAL HISTORY: Social History   Socioeconomic History   Marital status: Married    Spouse name: Marcello Moores   Number of children: 2   Years of education: 16   Highest education level: Bachelor's degree (e.g., BA, AB, BS)  Occupational History   Occupation: Retired  Tobacco Use   Smoking status: Never   Smokeless tobacco: Never  Vaping  Use   Vaping status: Never Used  Substance and Sexual Activity   Alcohol use: Never   Drug use: Never   Sexual activity: Not Currently  Other Topics Concern   Not on file  Social History Narrative   Consumes no caffeine   Right handed   One story house    Social Determinants of Health   Financial Resource Strain: Low Risk  (03/31/2018)   Overall Financial Resource Strain (CARDIA)    Difficulty of Paying Living Expenses: Not hard at all  Food Insecurity: No Food Insecurity (03/31/2018)   Hunger Vital Sign    Worried About Running Out of Food in the Last Year: Never true    Ran Out of Food in the Last Year: Never true  Transportation Needs: No Transportation Needs (03/31/2018)   PRAPARE - Administrator, Civil Service (Medical): No    Lack of Transportation (Non-Medical): No  Physical Activity: Insufficiently Active (03/31/2018)   Exercise Vital Sign    Days of Exercise per Week: 1 day    Minutes of Exercise per Session: 30 min  Stress: No Stress Concern Present (03/31/2018)   [employer] of Occupational Health - Occupational Stress Questionnaire    Feeling of Stress : Not at all  Social Connections: Moderately Integrated (03/31/2018)   Social Connection and Isolation Panel [NHANES]    Frequency of Communication with Friends and Family: More than three times a week    Frequency of Social Gatherings with Friends and Family: More than three times a week    Attends Religious Services: 1 to 4 times  per year    Active Member of Clubs or Organizations: No    Attends Banker Meetings: Never    Marital Status: Married  Catering manager Violence: Not At Risk (03/31/2018)   Humiliation, Afraid, Rape, and Kick questionnaire    Fear of Current or Ex-Partner: No    Emotionally Abused: No    Physically Abused: No    Sexually Abused: No     PHYSICAL EXAM: There were no vitals filed for this visit. General: No acute distress Head:   Normocephalic/atraumatic Skin/Extremities: No rash, no edema Neurological Exam: alert and oriented to person, place, and time. No aphasia or dysarthria. Fund of knowledge is appropriate.  Recent and remote memory are intact.  Attention and concentration are normal.   Cranial nerves: Pupils equal, round. Extraocular movements intact with no nystagmus. Visual fields full.  No facial asymmetry.  Motor: Bulk and tone normal, muscle strength 5/5 throughout with no pronator drift.   Finger to nose testing intact.  Gait narrow-based and steady, able to tandem walk adequately.  Romberg negative.   IMPRESSION: This is a pleasant 82 yo RH woman with a history of hypertension, hyperlipidemia, diabetes, neuropathy, left carotid stenosis, who presented in 02/2021 for second opinion regarding seizures. She was having recurrent intermittent episodes of paresthesias on one side of her body, initially reporting symptoms on the right side, but other times on the left. Her routine EEGs and her 24-hour EEG in 03/2021 showed occasional focal slowing over the left temporal region, no epileptiform discharges seen. Repeat brain MRIs have not shown any acute changes, no structural abnormality on the left temporal region. Symptoms atypical for seizure, she has weaned off Levetiracetam with no recurrence of symptoms. Main concern today are head sensations of unclear etiology. Neurological exam shows mild neuropathy. Head CT no acute changes. We discussed starting symptomatic treatment with low dose Gabapentin 50mg  qhs (gabapentin 250mg /38mL: take 1 mL qhs). Side effects discussed, this may help with neuropathic pain as well. She was advised to monitor how sleep (and stress) affect her symptoms. Follow-up as scheduled in September, call for any changes.       Thank you for allowing me to participate in *** care.  Please do not hesitate to call for any questions or concerns.  The duration of this appointment visit was *** minutes of  face-to-face time with the patient.  Greater than 50% of this time was spent in counseling, explanation of diagnosis, planning of further management, and coordination of care.   Patrcia Dolly, M.D.   CC: ***

## 2023-02-20 ENCOUNTER — Ambulatory Visit (INDEPENDENT_AMBULATORY_CARE_PROVIDER_SITE_OTHER): Payer: Medicare Other | Admitting: Neurology

## 2023-02-20 ENCOUNTER — Encounter: Payer: Self-pay | Admitting: Neurology

## 2023-02-20 VITALS — BP 119/69 | HR 70 | Ht 65.0 in | Wt 127.6 lb

## 2023-02-20 DIAGNOSIS — R202 Paresthesia of skin: Secondary | ICD-10-CM | POA: Diagnosis not present

## 2023-02-20 NOTE — Patient Instructions (Signed)
Good to see you doing well! Good luck with the right shoulder. Follow-up as needed, call for any changes. You can ask for my nurse Herbert Seta when you call if needed.

## 2023-02-25 ENCOUNTER — Telehealth: Payer: Self-pay

## 2023-02-25 DIAGNOSIS — I10 Essential (primary) hypertension: Secondary | ICD-10-CM

## 2023-02-25 MED ORDER — MINOXIDIL 10 MG PO TABS
10.0000 mg | ORAL_TABLET | Freq: Every day | ORAL | 2 refills | Status: AC
Start: 2023-02-25 — End: ?

## 2023-02-25 NOTE — Telephone Encounter (Signed)
Patient called to inform us that she has been noticing that her hair has been falling off for the past 6 months. Patient mention she thinks it could be the clonidine when Dr. Rosemary Holms increased it to BID. Patient would like to know what should she do.

## 2023-02-25 NOTE — Telephone Encounter (Signed)
Discontinue Clonidine and sent new Medication which will help with hair loss and will help for hair  growth. To stop if she starts getting facial hair

## 2023-02-25 NOTE — Telephone Encounter (Signed)
ICD-10-CM   1. Essential hypertension  I10 minoxidil (LONITEN) 10 MG tablet     Medications Discontinued During This Encounter  Medication Reason   cloNIDine (CATAPRES) 0.1 MG tablet Side effect (s)   Hair loss  Meds ordered this encounter  Medications   minoxidil (LONITEN) 10 MG tablet    Sig: Take 1 tablet (10 mg total) by mouth daily.    Dispense:  30 tablet    Refill:  2    Discontinue Catapres

## 2023-03-03 ENCOUNTER — Telehealth: Payer: Self-pay | Admitting: Cardiology

## 2023-03-03 DIAGNOSIS — R21 Rash and other nonspecific skin eruption: Secondary | ICD-10-CM | POA: Diagnosis not present

## 2023-03-03 DIAGNOSIS — Z23 Encounter for immunization: Secondary | ICD-10-CM | POA: Diagnosis not present

## 2023-03-03 NOTE — Telephone Encounter (Signed)
Pt c/o medication issue:  1. Name of Medication:   minoxidil (LONITEN) 10 MG tablet    2. How are you currently taking this medication (dosage and times per day)?   Take 1 tablet (10 mg total) by mouth daily.    3. Are you having a reaction (difficulty breathing--STAT)? No  4. What is your medication issue? Pt states that she would like a callback regarding medication. She says that she has questions before she starts to take. Please advise

## 2023-03-03 NOTE — Telephone Encounter (Signed)
Per previous phone encounter:  Discontinue Clonidine and sent new Medication which will help with hair loss and will help for hair  growth. To stop if she starts getting facial hair       Returned call to patient.  Adv of recommendation to stop clonidine and start minoxidil.  She asked about side effects including hair growth - recommendation to let us know if this becomes and issue and stop the medication.

## 2023-03-04 ENCOUNTER — Emergency Department (HOSPITAL_COMMUNITY): Payer: Medicare Other

## 2023-03-04 ENCOUNTER — Other Ambulatory Visit: Payer: Self-pay

## 2023-03-04 ENCOUNTER — Emergency Department (HOSPITAL_COMMUNITY)
Admission: EM | Admit: 2023-03-04 | Discharge: 2023-03-04 | Disposition: A | Discharge status: Home / Self Care | Payer: Medicare Other | Attending: Emergency Medicine | Admitting: Emergency Medicine

## 2023-03-04 ENCOUNTER — Encounter (HOSPITAL_COMMUNITY): Payer: Self-pay

## 2023-03-04 ENCOUNTER — Telehealth: Payer: Self-pay | Admitting: Cardiology

## 2023-03-04 DIAGNOSIS — I10 Essential (primary) hypertension: Secondary | ICD-10-CM | POA: Insufficient documentation

## 2023-03-04 DIAGNOSIS — R918 Other nonspecific abnormal finding of lung field: Secondary | ICD-10-CM | POA: Diagnosis not present

## 2023-03-04 DIAGNOSIS — Z7982 Long term (current) use of aspirin: Secondary | ICD-10-CM | POA: Insufficient documentation

## 2023-03-04 DIAGNOSIS — E119 Type 2 diabetes mellitus without complications: Secondary | ICD-10-CM | POA: Diagnosis not present

## 2023-03-04 DIAGNOSIS — I2510 Atherosclerotic heart disease of native coronary artery without angina pectoris: Secondary | ICD-10-CM | POA: Insufficient documentation

## 2023-03-04 DIAGNOSIS — T465X5A Adverse effect of other antihypertensive drugs, initial encounter: Secondary | ICD-10-CM | POA: Diagnosis not present

## 2023-03-04 DIAGNOSIS — R000 Tachycardia, unspecified: Secondary | ICD-10-CM | POA: Insufficient documentation

## 2023-03-04 DIAGNOSIS — R0602 Shortness of breath: Secondary | ICD-10-CM | POA: Diagnosis not present

## 2023-03-04 DIAGNOSIS — T50905A Adverse effect of unspecified drugs, medicaments and biological substances, initial encounter: Secondary | ICD-10-CM

## 2023-03-04 DIAGNOSIS — I517 Cardiomegaly: Secondary | ICD-10-CM | POA: Diagnosis not present

## 2023-03-04 LAB — COMPREHENSIVE METABOLIC PANEL
ALT: 23 U/L (ref 0–44)
AST: 28 U/L (ref 15–41)
Albumin: 4.2 g/dL (ref 3.5–5.0)
Alkaline Phosphatase: 43 U/L (ref 38–126)
Anion gap: 10 (ref 5–15)
BUN: 23 mg/dL (ref 8–23)
CO2: 26 mmol/L (ref 22–32)
Calcium: 9.9 mg/dL (ref 8.9–10.3)
Chloride: 102 mmol/L (ref 98–111)
Creatinine, Ser: 0.67 mg/dL (ref 0.44–1.00)
GFR, Estimated: >60 mL/min (ref >60)
Glucose, Bld: 106 mg/dL — ABNORMAL HIGH (ref 70–99)
Potassium: 3.9 mmol/L (ref 3.5–5.1)
Sodium: 138 mmol/L (ref 135–145)
Total Bilirubin: 1.3 mg/dL — ABNORMAL HIGH (ref 0.3–1.2)
Total Protein: 7.5 g/dL (ref 6.5–8.1)

## 2023-03-04 LAB — URINALYSIS, ROUTINE W REFLEX MICROSCOPIC
Bilirubin Urine: NEGATIVE
Glucose, UA: NEGATIVE mg/dL
Hgb urine dipstick: NEGATIVE
Ketones, ur: NEGATIVE mg/dL
Leukocytes,Ua: NEGATIVE
Nitrite: NEGATIVE
Protein, ur: NEGATIVE mg/dL
Specific Gravity, Urine: 1.01 (ref 1.005–1.030)
pH: 7 (ref 5.0–8.0)

## 2023-03-04 LAB — CBC WITH DIFFERENTIAL/PLATELET
Abs Immature Granulocytes: 0.02 10*3/uL (ref 0.00–0.07)
Basophils Absolute: 0 10*3/uL (ref 0.0–0.1)
Basophils Relative: 0 %
Eosinophils Absolute: 0.1 10*3/uL (ref 0.0–0.5)
Eosinophils Relative: 1 %
HCT: 40.4 % (ref 36.0–46.0)
Hemoglobin: 13 g/dL (ref 12.0–15.0)
Immature Granulocytes: 0 %
Lymphocytes Relative: 21 %
Lymphs Abs: 1.5 10*3/uL (ref 0.7–4.0)
MCH: 28.7 pg (ref 26.0–34.0)
MCHC: 32.2 g/dL (ref 30.0–36.0)
MCV: 89.2 fL (ref 80.0–100.0)
Monocytes Absolute: 0.7 10*3/uL (ref 0.1–1.0)
Monocytes Relative: 9 %
Neutro Abs: 4.9 10*3/uL (ref 1.7–7.7)
Neutrophils Relative %: 69 %
Platelets: 224 10*3/uL (ref 150–400)
RBC: 4.53 MIL/uL (ref 3.87–5.11)
RDW: 14.4 % (ref 11.5–15.5)
WBC: 7.2 10*3/uL (ref 4.0–10.5)
nRBC: 0 % (ref 0.0–0.2)

## 2023-03-04 LAB — TROPONIN I (HIGH SENSITIVITY): Troponin I (High Sensitivity): 5 ng/L (ref <18)

## 2023-03-04 NOTE — Telephone Encounter (Signed)
Pt c/o medication issue:  1. Name of Medication: minoxidil (LONITEN) 10 MG tablet   2. How are you currently taking this medication (dosage and times per day)?  Take 1 tablet (10 mg total) by mouth daily.       3. Are you having a reaction (difficulty breathing--STAT)? No  4. What is your medication issue? Pt is requesting a callback regarding today being her first time taking this medication and it's making her feel woozy, strange feeling on her tongue, breathing heavy and just no energy to do anything. She stated she just doesn't feel normal and she'd been reading online about some of the side effects. She found out if you have asthma or bronchitis you'll have some issues possibly so being that she does and was prescribed this medication by Dr. Jacinto Halim, she has major concerns and would like to discuss further with nurse or MD. Please advise .

## 2023-03-04 NOTE — Telephone Encounter (Signed)
Noted. Thank you for follow up/  Thanks MJP

## 2023-03-04 NOTE — ED Triage Notes (Addendum)
Patient stated she began taking minoxidal this morning at 8am. First time first dose. Patient said she began having right sided forehead pain, "woozy," shortness of breath, elevated heart rate, left calf pain. Also began having vaginal pain last night along with dysuria. Denies itchy throat or tongue swelling.

## 2023-03-04 NOTE — ED Notes (Signed)
Urine sent to the lab

## 2023-03-04 NOTE — Discharge Instructions (Signed)
I recommend you talk to your doctor before you take the medication again.  Return for new or worsening symptoms.

## 2023-03-04 NOTE — Telephone Encounter (Signed)
Spoke with patient and she states she started on minoxidil this morning  and after taking medication she started feeling woozy, her breathing was heavy, her lips and tongue was tingling. Took a nap feels a little better but her lips and tongue are still tingling,. Denies any swelling. Did advise to hold for now and will forward to provider

## 2023-03-04 NOTE — Telephone Encounter (Signed)
Would suggest cutting to 5 mg for first week, then if tolerates increase to 10 mg

## 2023-03-04 NOTE — ED Provider Notes (Signed)
[place] EMERGENCY DEPARTMENT AT [facility] Provider Note   CSN: [id number] Arrival date & time: 03/04/23  1511     History {Add pertinent medical, surgical, social history, OB history to HPI:1} Chief Complaint  Patient presents with   Medication Reaction    Sara Brown is a 82 y.o. female.  HPI 82 year old female history of type 2 diabetes, hyperlipidemia, CAD, hypertension, GERD presenting for concern for medication reaction.  She states she has been taking clonidine for some time.  She had some hair loss that she was told to stop it and start taking minoxidil which she took this morning for the first time.  Shortly after she felt like her lips and tongue were numb but not swollen.  She felt some pain in her forehead which was not sudden onset is resolved, and she felt somewhat short of breath.  She felt like her heart rate was fast 2, got up to 78.  She does get some vaginal pain last night, no discharge or bleeding but had some dysuria last night as well.  Both of these have resolved.  No rash.  No swelling or lesions to her mouth.  No wheezing.  She has not had any chest pain.  Currently she feels much better.  She came in because she just was not feeling good all day even after the other symptoms resolved.  She feels like she has had some pain in her calves for months which is unchanged and currently is asymptomatic.  No swelling.     Home Medications Prior to Admission medications   Medication Sig Start Date End Date Taking? Authorizing Provider  albuterol (PROVENTIL HFA;VENTOLIN HFA) 108 (90 Base) MCG/ACT inhaler Inhale 1 puff into the lungs every 4 (four) hours as needed for wheezing. 07/23/18   [provider]  alendronate (FOSAMAX) 70 MG tablet Take 70 mg by mouth every Saturday. 08/09/18   [provider]  aspirin EC 81 MG tablet Take 1 tablet (81 mg total) by mouth daily. Swallow whole. 01/07/22   Patwardhan, Anabel Bene, MD  Calcium  Citrate-Vitamin D 315-250 MG-UNIT TABS Take 2 tablets by mouth daily.    [provider]  carboxymethylcellulose (REFRESH PLUS) 0.5 % SOLN Place 1 drop into both eyes in the morning and at bedtime.    [provider]  Cholecalciferol (VITAMIN D3) 10 MCG (400 UNIT) CAPS Take 800 Units by mouth daily.    [provider]  clobetasol cream (TEMOVATE) 0.05 % APPLY TO AFFECTED AREA DAILY Patient not taking: Reported on 02/20/2023    [provider]  Clobetasol Prop Emollient Base (CLOBETASOL PROPIONATE E) 0.05 % emollient cream Apply 1 application topically 2 (two) times daily as needed. 07/02/21   Janalyn Harder, MD  Coenzyme Q10 (COQ10) 200 MG CAPS Take 200 mg by mouth daily.    [provider]  cycloSPORINE (RESTASIS) 0.05 % ophthalmic emulsion Place 1 drop into both eyes 2 (two) times daily.     [provider]  fluticasone (FLONASE) 50 MCG/ACT nasal spray Place 1 spray into both nostrils in the morning and at bedtime.    [provider]  Fluticasone-Salmeterol [facility] INHUB IN) Inhale into the lungs.    [provider]  Garlic 1000 MG CAPS Take 1,000 mg by mouth daily as needed (high blood pressure).    [provider]  Magnesium 250 MG TABS Take 250 mg by mouth daily.    [provider]  minoxidil (LONITEN) 10 MG  tablet Take 1 tablet (10 mg total) by mouth daily. 02/25/23   Yates Decamp, MD  Multiple Vitamins-Minerals (CENTRUM SILVER) CHEW See admin instructions.    [provider]  nitroGLYCERIN (NITROSTAT) 0.4 MG SL tablet Place 1 tablet (0.4 mg total) under the tongue every 5 (five) minutes as needed for chest pain. 11/14/22 02/20/23  Patwardhan, Anabel Bene, MD  NON FORMULARY [place] 1800    [provider]  NON FORMULARY Take 1 tablet by mouth daily. Bone up takes one tablet daily    [provider]  Pitavastatin Calcium (LIVALO) 2 MG TABS TAKE 1 TABLET BY MOUTH EVERY DAY Patient taking  differently: Use as directed 2 mg in the mouth or throat at bedtime. 04/25/22   Patwardhan, Anabel Bene, MD  polyethylene glycol (MIRALAX / GLYCOLAX) packet Take 17 g by mouth daily as needed for moderate constipation.    [provider]  pyridOXINE (VITAMIN B-6) 100 MG tablet Take 100 mg by mouth daily.     [provider]  spironolactone (ALDACTONE) 50 MG tablet Take 50 mg by mouth daily.     [provider]      Allergies    Amlodipine besylate, Hydralazine, Losartan potassium, Naproxen, Prednisone, Amlodipine, Isordil [isosorbide nitrate], and Losartan    Review of Systems   Review of Systems Review of systems completed and notable as per HPI.  ROS otherwise negative.   Physical Exam Updated Vital Signs BP 109/73 (BP Location: Left Arm)   Pulse 95   Temp 98.8 F (37.1 C) (Oral)   Resp 16   Ht 5\' 6"  (1.676 m)   Wt 57.5 kg   SpO2 100%   BMI 20.47 kg/m  Physical Exam Vitals and nursing note reviewed.  Constitutional:      General: She is not in acute distress.    Appearance: She is well-developed.  HENT:     Head: Normocephalic and atraumatic.     Nose: Nose normal. No congestion or rhinorrhea.     Mouth/Throat:     Mouth: Mucous membranes are moist.     Pharynx: Oropharynx is clear.  Eyes:     Extraocular Movements: Extraocular movements intact.     Conjunctiva/sclera: Conjunctivae normal.     Pupils: Pupils are equal, round, and reactive to light.  Cardiovascular:     Rate and Rhythm: Normal rate and regular rhythm.     Heart sounds: No murmur heard. Pulmonary:     Effort: Pulmonary effort is normal. No respiratory distress.     Breath sounds: Normal breath sounds.  Abdominal:     Palpations: Abdomen is soft.     Tenderness: There is no abdominal tenderness. There is no guarding or rebound.  Musculoskeletal:        General: No swelling.     Cervical back: Neck supple.     Right lower leg: No edema.     Left lower leg: No edema.   Skin:    General: Skin is warm and dry.     Capillary Refill: Capillary refill takes less than 2 seconds.     Findings: No rash.  Neurological:     General: No focal deficit present.     Mental Status: She is alert and oriented to person, place, and time. Mental status is at baseline.  Psychiatric:        Mood and Affect: Mood normal.     ED Results / Procedures / Treatments   Labs (all labs ordered are listed, but  only abnormal results are displayed) Labs Reviewed - No data to display  EKG None  Radiology No results found.  Procedures Procedures  {Document cardiac monitor, telemetry assessment procedure when appropriate:1}  Medications Ordered in ED Medications - No data to display  ED Course/ Medical Decision Making/ A&P   {   Click here for ABCD2, HEART and other calculatorsREFRESH Note before signing :1}                              Medical Decision Making  Medical Decision Making:   Sara Brown is a 82 y.o. female who presented to the ED today with multiple concerns regarding medication reaction.  Vital signs reviewed.  Exam she is well-appearing.  She is quite anxious.  She reports feeling woozy, short of breath, mild forehead pain as well as some abnormal sensation in her lips after taking minoxidil for the first time today.  She did not take any Benadryl or other medications for allergic reaction and symptoms have since resolved.  She never had any chest pain, did have some shortness of breath obtain EKG, lab workup for evaluation.  Have low suspicion for PE, she has no DVT symptoms on exam, no tachycardia, current shortness of breath, or hypoxia.  No signs of infection.  No signs of serious drug rash.   {crccomplexity:27900} Reviewed and confirmed nursing documentation for past medical history, family history, social history.  Reassessment and Plan:   ***    Patient's presentation is most consistent with {EM COPA:27473}     {Document critical  care time when appropriate:1} {Document review of labs and clinical decision tools ie heart score, Chads2Vasc2 etc:1}  {Document your independent review of radiology images, and any outside records:1} {Document your discussion with family members, caretakers, and with consultants:1} {Document social determinants of health affecting pt's care:1} {Document your decision making why or why not admission, treatments were needed:1} Final Clinical Impression(s) / ED Diagnoses Final diagnoses:  None    Rx / DC Orders ED Discharge Orders     None

## 2023-03-04 NOTE — Telephone Encounter (Addendum)
Called patient to give provider recommendation and she stated she is at [facility] because she does not feel well. She will calls [place] back

## 2023-03-05 ENCOUNTER — Telehealth: Payer: Self-pay | Admitting: Cardiology

## 2023-03-05 DIAGNOSIS — G8929 Other chronic pain: Secondary | ICD-10-CM | POA: Diagnosis not present

## 2023-03-05 DIAGNOSIS — M19011 Primary osteoarthritis, right shoulder: Secondary | ICD-10-CM | POA: Diagnosis not present

## 2023-03-05 DIAGNOSIS — T887XXA Unspecified adverse effect of drug or medicament, initial encounter: Secondary | ICD-10-CM | POA: Diagnosis not present

## 2023-03-05 DIAGNOSIS — M25511 Pain in right shoulder: Secondary | ICD-10-CM | POA: Diagnosis not present

## 2023-03-05 NOTE — Telephone Encounter (Signed)
I made the pt an appt for 03/11/23... pt will check her BP a few hours after taking her meds and then again in the evening and write it down for her.   She will bring her BP log and cuff with her to her appt.

## 2023-03-05 NOTE — Telephone Encounter (Signed)
Left a message for the pt to call back.  

## 2023-03-05 NOTE — Telephone Encounter (Addendum)
I spoke with the pt and she took the Minoxidil 5 mg today per the pharmacist..  Note 03/04/23 Would suggest cutting to 5 mg for first week, then if tolerates increase to 10 mg     She says she does not feel well at all... BP 142/73 when she woke up.. took her meds at 7:45 am and now at 10 am and her BP is 150/80 HR 91.   She is c/o pain in the back of her head, her left hand hurts, and just generally does not feel good no specific symptoms.   She does not want to talk to her PCP since she feels it is related to her BP meds.   She will continue to monitor and I advised her to stop checking her BP so much for now and just try to relax and give it a couple of hours before she rechecks it.

## 2023-03-05 NOTE — Telephone Encounter (Signed)
Patient is returning call. Please advise? 

## 2023-03-05 NOTE — Telephone Encounter (Signed)
I spoke with patient.  Minoxidil was decreased yesterday due to side effects.  She was seen in ED yesterday.  Today she took 5 mg dose of Minoxidil.  She then developed tension in her neck and facial tightness.  Facial tightness was initially on the right but now it is on both sides of her face but greater on the left side. No facial droop.  Speech is clear.  Feels bottom lip may be slightly bigger. BP is 144/95. She has been taking home remedy of apple cider vinegar in water with sweetner and garlic water to help with BP.  Will forward to Dr Rosemary Holms for recommendations. Patient advised to go to to ED or Urgent care if symptoms worsen.

## 2023-03-05 NOTE — Telephone Encounter (Signed)
Pt c/o medication issue:  1. Name of Medication: Minoxidil 10 mg  2. How are you currently taking this medication (dosage and times per day)?  1 tablet a day  3. Are you having a reaction (difficulty breathing--STAT)?   4. What is your medication issue?  today she took a half a tablet,-back of her neck is tight, right side of her face is feeling funny,blood pressure is up -144/95- using a lot of home remedies to bring her blood pressure

## 2023-03-05 NOTE — Telephone Encounter (Signed)
I agree with your recommendations. Historically, she has had a lot side effects and intolerances to several medications and anxiety that comes along. I am happy to see her follow up at next available, if needed.  Thanks MJP

## 2023-03-05 NOTE — Telephone Encounter (Signed)
Pt advised that she is to take the Minoxidil 5 mg a day and after a few days she can take the 10 mg... or she can try 5 mg BID.Marland Kitchen and she will keep track of her BP.   See other encounter.. pt has appt next week.

## 2023-03-05 NOTE — Telephone Encounter (Signed)
Pt c/o medication issue:  1. Name of Medication: minoxidil (LONITEN) 10 MG tablet   2. How are you currently taking this medication (dosage and times per day)? Take 1 tablet (10 mg total) by mouth daily. Patient is taking differently  3. Are you having a reaction (difficulty breathing--STAT)? No   4. What is your medication issue? Patient said that Dr. Rosemary Holms put her on 1/2 tablet by mouth daily due to patient not taking medication well. Want to know how she should be taking medication or if there is an alternative for her to take

## 2023-03-08 ENCOUNTER — Telehealth: Payer: Self-pay | Admitting: Cardiology

## 2023-03-08 NOTE — Telephone Encounter (Signed)
Outpatient service line: Chief complaint: Medication side effects, possible angioedema  Patient has newly been diagnosed minoxidil and previously exhibited signs of swelling in her face, lips, tongue, also with some shortness of breath.  She was seen at the emergency room recently.  Side effects were notified by primary MD who recommended reducing dose.  She continues to exhibit ongoing symptoms concerning for angioedema.  Discussed with patient need for emergency evaluation as she is demonstrating an allergic reaction and intolerance to medication with some difficulty breathing.  Patient reluctant to go to the emergency room despite recommendations.  Unclear whether or not she will go.  I have given her ER precautions and have reiterated need to be seen today for her symptoms.  I will forward this to primary MD to follow-up.

## 2023-03-10 DIAGNOSIS — R220 Localized swelling, mass and lump, head: Secondary | ICD-10-CM | POA: Diagnosis not present

## 2023-03-10 DIAGNOSIS — T887XXA Unspecified adverse effect of drug or medicament, initial encounter: Secondary | ICD-10-CM | POA: Diagnosis not present

## 2023-03-11 ENCOUNTER — Encounter: Payer: Self-pay | Admitting: Cardiology

## 2023-03-11 ENCOUNTER — Ambulatory Visit: Payer: Medicare Other | Attending: Cardiology | Admitting: Cardiology

## 2023-03-11 VITALS — BP 140/78 | HR 94 | Wt 130.0 lb

## 2023-03-11 DIAGNOSIS — I10 Essential (primary) hypertension: Secondary | ICD-10-CM

## 2023-03-11 DIAGNOSIS — I25118 Atherosclerotic heart disease of native coronary artery with other forms of angina pectoris: Secondary | ICD-10-CM | POA: Diagnosis not present

## 2023-03-11 NOTE — Patient Instructions (Signed)
Medication Instructions:  STOP MINOXIDIL  *If you need a refill on your cardiac medications before your next appointment, please call your pharmacy*   Lab Work:  If you have labs (blood work) drawn today and your tests are completely normal, you will receive your results only by: MyChart Message (if you have MyChart) OR A paper copy in the mail If you have any lab test that is abnormal or we need to change your treatment, we will call you to review the results.   Testing/Procedures:    Follow-Up: At [facility], you and your health needs are our priority.  As part of our continuing mission to provide you with exceptional heart care, we have created designated Provider Care Teams.  These Care Teams include your primary Cardiologist (physician) and Advanced Practice Providers (APPs -  Physician Assistants and Nurse Practitioners) who all work together to provide you with the care you need, when you need it.  We recommend signing up for the patient portal called "MyChart".  Sign up information is provided on this After Visit Summary.  MyChart is used to connect with patients for Virtual Visits (Telemedicine).  Patients are able to view lab/test results, encounter notes, upcoming appointments, etc.  Non-urgent messages can be sent to your provider as well.   To learn more about what you can do with MyChart, go to [email or web address].    Your next appointment:  AS NEEDED  REFERRAL TO THE PHARM D FOR BP

## 2023-03-11 NOTE — Progress Notes (Signed)
Cardiology Office Note:  .   Date:  03/11/2023  ID:  Sara Brown, DOB 21-Jul-1940, MRN [id number] PCP: Renford Dills, MD  [facility] Providers Cardiologist:  Truett Mainland, MD PCP: Renford Dills, MD  History of Present Illness: .    82 y/o African-American female with hypertension, diet controlled diabetes, hyperlipidemia,  GERD and remote history of TIA, mild nonobstructive coronary artery disease (Cath 04/01/2018).   Patient has been intolerant to several medications-including but not limited to losartan, amlodipine, hydralazine, atenolol, nifedipine, clonidine, reluctant to use carvedilol and labetalol due to history of asthma. Most recently, her clonidine was stopped due to complaints of hair loss and minoxidil was started instead, but she does not want to take it due to concern of having heart disease.    ROS:  Review of Systems  Cardiovascular:  Positive for chest pain. Negative for dyspnea on exertion, leg swelling, palpitations and syncope.     Studies Reviewed: Marland Kitchen       Exercise nuclear stress test 11/25/2022 (independently interpreted by me): 7.0 METS  Small size, mild intensity, reversible perfusion defect in inferior myocardium.    Physical Exam:   Physical Exam Vitals and nursing note reviewed.  Constitutional:      General: She is not in acute distress. Neck:     Vascular: No JVD.  Cardiovascular:     Rate and Rhythm: Normal rate and regular rhythm.     Heart sounds: Normal heart sounds. No murmur heard. Pulmonary:     Effort: Pulmonary effort is normal.     Breath sounds: Normal breath sounds. No wheezing or rales.  Musculoskeletal:     Right lower leg: No edema.     Left lower leg: No edema.      ASSESSMENT AND PLAN: .    82 y/o African-American female with hypertension, diet controlled diabetes, hyperlipidemia,  GERD and remote history of TIA, mild nonobstructive coronary artery disease (Cath 04/01/2018).    Hypertension: Patient has been intolerant to several medications-including but not limited to losartan, amlodipine, hydralazine, atenolol, nifedipine, clonidine, reluctant to use carvedilol and labetalol due to history of asthma. Most recently, her clonidine was stopped due to complaints of hair loss and minoxidil was started instead, but she does not want to take it due to concern of having heart disease.   Continue spironolactone 50 mg daily.  Stop minoxidil due to patient's concerns. Given mildly abnormal stress test, I offered to add isosorbide mononitrate 30 mg daily for management of chest pain, as well as blood pressure.  She is directed to take this due to headache that she had when she was young.  Unfortunately, I have exhausted options for antihypertensive therapy for [name].  I have referred her to hypertension clinic.  Coronary artery disease: Nonobstructive on cath in 2019. Recent chest pain is rather atypical.  Stress test showed mild inferior ischemia.  There is able to manage this medically for now.  If worsening exertional chest pain, could consider heart catheterization in future.    Continue risk factor modification with BP and lipid lowering therapy. Continue aspirin 81 mg daily (does not need to take plavix), pitvastatin. She has not tolerated other statins in the past. She does not want to try injectables.    F/u as needed  Signed, Elder Negus, MD

## 2023-03-14 ENCOUNTER — Emergency Department (HOSPITAL_COMMUNITY)
Admission: EM | Admit: 2023-03-14 | Discharge: 2023-03-14 | Disposition: A | Discharge status: Home / Self Care | Payer: Medicare Other

## 2023-03-14 ENCOUNTER — Encounter (HOSPITAL_COMMUNITY): Payer: Self-pay

## 2023-03-14 ENCOUNTER — Other Ambulatory Visit: Payer: Self-pay

## 2023-03-14 ENCOUNTER — Telehealth: Payer: Self-pay | Admitting: Podiatry

## 2023-03-14 DIAGNOSIS — Z7982 Long term (current) use of aspirin: Secondary | ICD-10-CM | POA: Diagnosis not present

## 2023-03-14 DIAGNOSIS — J45909 Unspecified asthma, uncomplicated: Secondary | ICD-10-CM | POA: Diagnosis not present

## 2023-03-14 DIAGNOSIS — I10 Essential (primary) hypertension: Secondary | ICD-10-CM | POA: Diagnosis not present

## 2023-03-14 MED ORDER — DOXAZOSIN MESYLATE 1 MG PO TABS: 1.0000 mg | ORAL_TABLET | Freq: Every day | ORAL | 0 refills | Status: DC

## 2023-03-14 NOTE — ED Notes (Addendum)
9:38 PM  Patient reports elevated BP with SBP 160s-170s over DBP 100s x 1 week. Recently taken off of clonidine medication due to side effects. Denies CP, SOB, HA, dizziness, lightheadedness, nausea, vomiting, fevers, weakness. She does endorse a "tightness" sensation to side of face with tightness of tongue and mouth. She is alert and oriented x 4. She has equal rise and fall of the chest wall with clear lung sounds. Denies pain. Pending ER provider assessment and orders. Bed in lowest position. Family present at bedside.   10:01 PM  MD present at bedside.   11:26 PM  Discharge instructions discussed with patient and family. Patient and family voice understanding of discharge instructions and declines any questions or concerns regarding discharge. Patient is stable at discharge. Has steady and equal gait and declines wheelchair. Patient's husband to drive patient home.

## 2023-03-14 NOTE — Discharge Instructions (Addendum)
Please try the doxazosin for your blood pressure.  Follow-up with your doctor and pharmacist as scheduled.  Return to the ER for worsening symptoms.

## 2023-03-14 NOTE — ED Triage Notes (Signed)
Pt was recently taken off her blood pressure medication. While taking her BP today her pressure reached 174/91, contacted her pharmacist and was recommended to go to the ER. Pt denies any pain or s/s a this time.

## 2023-03-14 NOTE — ED Provider Notes (Signed)
[place] EMERGENCY DEPARTMENT AT [facility] Provider Note   CSN: [id number] Arrival date & time: 03/14/23  4098     History  Chief Complaint  Patient presents with   Hypertension    Sara Brown is a 82 y.o. female.  82 year old female with past medical history of hypertension as well as asthma presenting to the emergency department today with concern for elevated blood pressure.  She called her pharmacist today when sure her blood pressure was in the 160s over low 100s and was told that she should come to the ER for further evaluation.  The patient tells me that she can tell when her blood pressure is elevated because she has a pressure sensation in her tongue and face.  She had this this morning.  She called the pharmacist and was told to come in.  She denies any headache.  Denies any focal weakness, numbness, or tingling.  She denies any chest pain.  She does have multiple allergies and has tried multiple blood pressure medications in the past and has not tolerated them.  She is here today for further evaluation.  Her blood pressure is improved here without any intervention.   Hypertension       Home Medications Prior to Admission medications   Medication Sig Start Date End Date Taking? Authorizing Provider  doxazosin (CARDURA) 1 MG tablet Take 1 tablet (1 mg total) by mouth daily. 03/14/23  Yes Durwin Glaze, MD  albuterol (PROVENTIL HFA;VENTOLIN HFA) 108 (90 Base) MCG/ACT inhaler Inhale 1 puff into the lungs every 4 (four) hours as needed for wheezing. 07/23/18   [provider]  alendronate (FOSAMAX) 70 MG tablet Take 70 mg by mouth every Saturday. 08/09/18   [provider]  aspirin EC 81 MG tablet Take 1 tablet (81 mg total) by mouth daily. Swallow whole. 01/07/22   Patwardhan, Anabel Bene, MD  Calcium Citrate-Vitamin D 315-250 MG-UNIT TABS Take 2 tablets by mouth daily.    [provider]  carboxymethylcellulose (REFRESH PLUS) 0.5  % SOLN Place 1 drop into both eyes in the morning and at bedtime.    [provider]  Cholecalciferol (VITAMIN D3) 10 MCG (400 UNIT) CAPS Take 800 Units by mouth daily.    [provider]  clobetasol cream (TEMOVATE) 0.05 %     [provider]  Clobetasol Prop Emollient Base (CLOBETASOL PROPIONATE E) 0.05 % emollient cream Apply 1 application topically 2 (two) times daily as needed. 07/02/21   Janalyn Harder, MD  Coenzyme Q10 (COQ10) 200 MG CAPS Take 200 mg by mouth daily.    [provider]  cycloSPORINE (RESTASIS) 0.05 % ophthalmic emulsion Place 1 drop into both eyes 2 (two) times daily.     [provider]  fluticasone (FLONASE) 50 MCG/ACT nasal spray Place 1 spray into both nostrils in the morning and at bedtime.    [provider]  Fluticasone-Salmeterol [facility] INHUB IN) Inhale into the lungs.    [provider]  Garlic 1000 MG CAPS Take 1,000 mg by mouth daily as needed (high blood pressure).    [provider]  Magnesium 250 MG TABS Take 250 mg by mouth daily.    [provider]  Multiple Vitamins-Minerals (CENTRUM SILVER) CHEW See admin instructions.    [provider]  nitroGLYCERIN (NITROSTAT) 0.4 MG SL tablet Place 1 tablet (0.4 mg total) under the tongue every 5 (five) minutes as needed for chest pain. 11/14/22 02/20/23  Patwardhan, Anabel Bene,  MD  NON FORMULARY Garlique 1800    [provider]  NON FORMULARY Take 1 tablet by mouth daily. Bone up takes one tablet daily    [provider]  Pitavastatin Calcium (LIVALO) 2 MG TABS TAKE 1 TABLET BY MOUTH EVERY DAY Patient taking differently: Use as directed 2 mg in the mouth or throat at bedtime. 04/25/22   Patwardhan, Anabel Bene, MD  polyethylene glycol (MIRALAX / GLYCOLAX) packet Take 17 g by mouth daily as needed for moderate constipation.    [provider]  pyridOXINE (VITAMIN B-6) 100 MG tablet Take 100 mg by mouth daily.      [provider]  spironolactone (ALDACTONE) 50 MG tablet Take 50 mg by mouth daily.     [provider]      Allergies    Amlodipine besylate, Hydralazine, Losartan potassium, Naproxen, Prednisone, Amlodipine, Isordil [isosorbide nitrate], and Losartan    Review of Systems   Review of Systems Gen: No fevers Eyes: No vision changes HEENT: no congestion, sore throat Neck: no neck stiffness Resp: no cough, shortness of breath Card: no chest pain Abd: no nausea or vomiting, no abdominal pain Extremities: no leg swelling Neuro: no weakness, numbness, tingling Skin: no rashes  Physical Exam Updated Vital Signs BP (!) 153/75 (BP Location: Left Arm)   Pulse 63   Temp 98.9 F (37.2 C) (Oral)   Resp 19   SpO2 99%  Physical Exam Gen: NAD Eyes: PERRL, EOMI HEENT: no oropharyngeal swelling Neck: trachea midline Resp: clear to auscultation bilaterally Card: RRR, no murmurs, rubs, or gallops Abd: nontender, nondistended Extremities: no calf tenderness, no edema Vascular: 2+ radial pulses bilaterally, 2+ DP pulses bilaterally Neuro: Cranial nerves intact, equal strength sensation throughout bilateral upper and lower extremities with no dysmetria on finger-to-nose testing Skin: no rashes Psyc: acting appropriately  ED Results / Procedures / Treatments   Labs (all labs ordered are listed, but only abnormal results are displayed) Labs Reviewed - No data to display  EKG None  Radiology No results found.  Procedures Procedures    Medications Ordered in ED Medications - No data to display  ED Course/ Medical Decision Making/ A&P                                 Medical Decision Making 82 year old female with past medical history of hypertension and asthma presents the emergency department today with elevated blood pressure.  The patient had labs last week and is declining any today.  Her EKG interpreted by me shows a sinus bradycardia with rate 59 with left  axis deviation, normal intervals, and nonspecific ST-T changes.  This does appear similar morphology to her previous EKG.  I did call and discuss her case with our pharmacist on-call.  Will try the patient on a alpha-blocker after reviewing all of her medication intolerances.  She will be discharged with return precautions.  She does not have any findings on exam or history concerning for endorgan dysfunction at this time.           Final Clinical Impression(s) / ED Diagnoses Final diagnoses:  Hypertension, unspecified type    Rx / DC Orders ED Discharge Orders          Ordered    doxazosin (CARDURA) 1 MG tablet  Daily        03/14/23 2311  Durwin Glaze, MD 03/14/23 [phone]

## 2023-03-14 NOTE — Telephone Encounter (Signed)
Pt would like a call back about her foot condition/ she, she called in last week

## 2023-03-17 ENCOUNTER — Telehealth: Payer: Self-pay | Admitting: Podiatry

## 2023-03-17 DIAGNOSIS — R531 Weakness: Secondary | ICD-10-CM | POA: Diagnosis not present

## 2023-03-17 DIAGNOSIS — M25611 Stiffness of right shoulder, not elsewhere classified: Secondary | ICD-10-CM | POA: Diagnosis not present

## 2023-03-17 DIAGNOSIS — M7541 Impingement syndrome of right shoulder: Secondary | ICD-10-CM | POA: Diagnosis not present

## 2023-03-17 DIAGNOSIS — M19011 Primary osteoarthritis, right shoulder: Secondary | ICD-10-CM | POA: Diagnosis not present

## 2023-03-17 NOTE — Telephone Encounter (Signed)
Attempted to call patient did not answer.

## 2023-03-17 NOTE — Telephone Encounter (Signed)
Pt returned your call.  

## 2023-03-19 DIAGNOSIS — I10 Essential (primary) hypertension: Secondary | ICD-10-CM | POA: Diagnosis not present

## 2023-03-19 DIAGNOSIS — M25511 Pain in right shoulder: Secondary | ICD-10-CM | POA: Diagnosis not present

## 2023-03-21 ENCOUNTER — Ambulatory Visit: Payer: Medicare Other | Attending: Cardiology | Admitting: Student

## 2023-03-21 ENCOUNTER — Encounter: Payer: Self-pay | Admitting: Student

## 2023-03-21 VITALS — BP 135/70 | HR 62

## 2023-03-21 DIAGNOSIS — I10 Essential (primary) hypertension: Secondary | ICD-10-CM | POA: Insufficient documentation

## 2023-03-21 NOTE — Assessment & Plan Note (Signed)
Assessment: BP in office BP 135/80 mmHg; given advance age can consider lenient BP goal <140/90   Home BP 143-151/86-92 range. Her HR ~75-65;home BP monitor never validated  BP stays near goal in AM but around 4 pm it increases to 145-150/90 range  Takes both BP medications in the morning (spironolactone 50 mg and doxazosin 1 mg daily) Tolerates current BP medications well without any side effects Denies SOB, palpitation, chest pain, headaches,or swelling Follows low salt diet but does not exercise  Reiterated the importance of regular exercise - motivated to start using stationary bike 10 min twice daily   Plan:  Start taking doxazosin 1 mg around noon or 1 pm  Continue taking spironolactone 50 mg in the morning  Patient to bring home BP monitor at next OV for validation  Patient to keep record of BP readings with heart rate and report to [place] at the next visit Patient to see PharmD in 3  weeks for follow up  Follow up lab(s): none

## 2023-03-21 NOTE — Progress Notes (Signed)
Patient ID: Sara Brown                 DOB: 1941-01-25                      MRN: [id number]      HPI: Sara Brown is a 82 y.o. female referred by Dr. Rosemary Holms to HTN clinic. PMH is significant for angina, CAD, TIA, HTN, T2DM, HLD. Patient intolerant to multiple BP medication.Patient was put on minoxidil on Oct 1, clonidine was stopped due to complaints of hair loss. Patient went to ED for hypertension on Oct 04 - patient was put on doxazosin 1 mg daily     Patient presented today for hypertension clinic. Reports most doctors have gave up on her for her BP problem due to her intolerance to multiple agents. She checks her BP multiple time during the day and reports she get stressed due to her husband. She feels good when she does yard work and go out for walk. Her husband thinks her BP problem is due to her anxiety. She feels funny on her tongue and gets headaches when her BP is elevated. Reports her BP in the morning stays in normal range since she started doxazosin but around 4 pm it start going up in 143-151/86-92 range. Her HR ~75-65 range. She thinks she tolerates the current BP medications well except the effect may be wearing out in the afternoon. When she sees her BP rising she drinks garlic tea and it brings it down. She also reports her BP goes too low sometime. It is simply hard to control her BP, her mother had a same problem. States Dr.Patwardhan put her on minoxidil and she had blister on her lips. She thought it was from the medication but later she found out it was from garlic tea. She wants me to let Dr.Patwardhan know about this.    Current HTN meds: spironolactone 50 mg daily in the morning, doxazosin 1 mg once a day in the morning  Previously tried: losartan, amlodipine, hydralazine, atenolol, nifedipine, clonidine, reluctant to use carvedilol and labetalol due to history of asthma,clonidine was stopped due to complaints of hair loss and minoxidil was started  instead, but she does not want to take it due to concern of having heart disease.   BP goal: <130/80 given advance age and allergic to multiple BP medication can consider lenient goal <140/90   Family History:  Mother- hypertension Father T2DM Sister- died from MI in her 28's  Other sisters: hypertension Social History:  Alcohol: none  Smoking: none  Diet: does not eat out and follows low salt diet   Exercise: has stationary bike but does not use    Home BP readings: ~143-151/86-92 range. Her HR ~75-65    Wt Readings from Last 3 Encounters:  03/11/23 130 lb (59 kg)  03/04/23 126 lb 12.8 oz (57.5 kg)  02/20/23 127 lb 9.6 oz (57.9 kg)   BP Readings from Last 3 Encounters:  03/21/23 135/70  03/14/23 (!) 146/81  03/11/23 (!) 140/78   Pulse Readings from Last 3 Encounters:  03/21/23 62  03/14/23 64  03/11/23 94    Renal function: Estimated Creatinine Clearance: 48.8 mL/min (by C-G formula based on SCr of 0.67 mg/dL).  Past Medical History:  Diagnosis Date   Age-related osteoporosis without current pathological fracture 08/18/2020   Allergic rhinitis    Angina at rest [facility]) 02/09/2018   Arthritis    "fingers, knees" (02/09/2018)  Asthma    "stopped taking RX after dr said I don't have this" (02/09/2018)   Basal cell carcinoma    Bradycardia 02/09/2018   Carotid stenosis    ICA(L)   Chest pain due to coronary artery disease 02/09/2018   Chronic bronchitis (HCC)    Chronic constipation 08/18/2020   Complication of anesthesia    "felt the cut w/one of my c-sections" (02/09/2018)   Coronary artery disease involving native coronary artery of native heart without angina pectoris 09/17/2018   Diabetes mellitus type 2, controlled 03/31/2018   EEG abnormal    Essential hypertension 03/31/2018   Generalized anxiety disorder    GERD (gastroesophageal reflux disease)    Hardening of the aorta (main artery of the heart)    Headache    "years ago; before menopause" (02/09/2018)    Hirsutism 06/12/2017   Hyperglycemia due to type 2 diabetes mellitus 03/09/2021   Hyperlipidemia    Hyperpigmentation 02/13/2017   Hypertensive retinopathy    Irritable bowel syndrome    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   Mixed hyperlipidemia 12/29/2020   Myringotomy tube status 04/03/2021   Neck mass 01/17/2017   Neuropathic pain 03/09/2021   OA (osteoarthritis)    Osteoporosis    Palpitations 03/16/2021   Paresthesias 09/05/2020   Personal history of colonic polyps    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     alendronate (FOSAMAX) 70 MG tablet Take 70 mg by mouth every Saturday.     aspirin EC 81 MG tablet Take 1 tablet (81 mg total) by mouth daily. Swallow whole. 90 tablet 3   Calcium Citrate-Vitamin D 315-250 MG-UNIT TABS Take 2 tablets by mouth daily.     carboxymethylcellulose (REFRESH PLUS) 0.5 % SOLN Place 1 drop into both eyes in the morning and at bedtime.     Cholecalciferol (VITAMIN D3) 10 MCG (400 UNIT) CAPS Take 800 Units by mouth daily.     clobetasol cream (TEMOVATE) 0.05 %      Clobetasol Prop Emollient Base (CLOBETASOL PROPIONATE E) 0.05 % emollient cream Apply 1 application topically 2 (two) times daily as needed. 60 g 6   Coenzyme Q10 (COQ10) 200 MG CAPS Take 200 mg by mouth daily.     cycloSPORINE (RESTASIS) 0.05 % ophthalmic emulsion Place 1 drop into both eyes 2 (two) times daily.      doxazosin (CARDURA) 1 MG tablet Take 1 tablet (1 mg total) by  mouth daily. 30 tablet 0   fluticasone (FLONASE) 50 MCG/ACT nasal spray Place 1 spray into both nostrils in the morning and at bedtime.     Fluticasone-Salmeterol (WIXELA INHUB IN) Inhale into the lungs.     Garlic 1000 MG CAPS Take 1,000 mg by mouth daily as needed (high blood pressure).     Magnesium 250 MG TABS Take 250 mg by mouth daily.     Multiple Vitamins-Minerals (CENTRUM SILVER) CHEW See admin instructions.     nitroGLYCERIN (NITROSTAT) 0.4 MG SL tablet Place 1  tablet (0.4 mg total) under the tongue every 5 (five) minutes as needed for chest pain. 30 tablet 1   NON FORMULARY Garlique 1800     NON FORMULARY Take 1 tablet by mouth daily. Bone up takes one tablet daily     Pitavastatin Calcium (LIVALO) 2 MG TABS TAKE 1 TABLET BY MOUTH EVERY DAY (Patient taking differently: Use as directed 2 mg in the mouth or throat at bedtime.) 336 tablet 1   polyethylene glycol (MIRALAX / GLYCOLAX) packet Take 17 g by mouth daily as needed for moderate constipation.     pyridOXINE (VITAMIN B-6) 100 MG tablet Take 100 mg by mouth daily.      spironolactone (ALDACTONE) 50 MG tablet Take 50 mg by mouth daily.      No current facility-administered medications on file prior to visit.    Allergies  Allergen Reactions   Amlodipine Besylate     Other reaction(s): rash and hives   Hydralazine     Other reaction(s): rash   Losartan Potassium     Other reaction(s): rash and HIves   Naproxen Other (See Comments)    Patient preference  Other reaction(s): upset stomach   Prednisone Other (See Comments)    Patient Preference  Other reaction(s): upset stomach   Amlodipine Hives   Isordil [Isosorbide Nitrate] Rash   Losartan Rash    Blood pressure 135/70, pulse 62, SpO2 98%.   Assessment/Plan:  1. Hypertension -  Essential hypertension Assessment: BP in office BP 135/80 mmHg; given advance age can consider lenient BP goal <140/90   Home BP 143-151/86-92 range. Her HR ~75-65;home BP monitor never  validated  BP stays near goal in AM but around 4 pm it increases to 145-150/90 range  Takes both BP medications in the morning (spironolactone 50 mg and doxazosin 1 mg daily) Tolerates current BP medications well without any side effects Denies SOB, palpitation, chest pain, headaches,or swelling Follows low salt diet but does not exercise  Reiterated the importance of regular exercise - motivated to start using stationary bike 10 min twice daily   Plan:  Start taking doxazosin 1 mg around noon or 1 pm  Continue taking spironolactone 50 mg in the morning  Patient to bring home BP monitor at next OV for validation  Patient to keep record of BP readings with heart rate and report to [place] at the next visit Patient to see PharmD in 3  weeks for follow up  Follow up lab(s): none       Thank you  Carmela Hurt, Pharm.D [facility] A Division of  [facility] [address], [place], [place] [address]  Phone: [phone]; Fax: [phone]

## 2023-03-21 NOTE — Patient Instructions (Addendum)
Changes made by your pharmacist Carmela Hurt, PharmD at today's visit:    Instructions/Changes  (what do you need to do) Your Notes  (what you did and when you did it)  Continue taking spironolactone 50 mg daily in the morning and start taking doxazosin 1 mg around noon or 1 pm   Start using stationary bike 5 min twice daily    Continue with low salt diet     Bring all of your meds, your BP cuff and your record of home blood pressures to your next appointment.    HOW TO TAKE YOUR BLOOD PRESSURE AT HOME  Rest 5 minutes before taking your blood pressure.  Don't smoke or drink caffeinated beverages for at least 30 minutes before. Take your blood pressure before (not after) you eat. Sit comfortably with your back supported and both feet on the floor (don't cross your legs). Elevate your arm to heart level on a table or a desk. Use the proper sized cuff. It should fit smoothly and snugly around your bare upper arm. There should be enough room to slip a fingertip under the cuff. The bottom edge of the cuff should be 1 inch above the crease of the elbow. Ideally, take 3 measurements at one sitting and record the average.  Important lifestyle changes to control high blood pressure  Intervention  Effect on the BP  Lose extra pounds and watch your waistline Weight loss is one of the most effective lifestyle changes for controlling blood pressure. If you're overweight or obese, losing even a small amount of weight can help reduce blood pressure. Blood pressure might go down by about 1 millimeter of mercury (mm Hg) with each kilogram (about 2.2 pounds) of weight lost.  Exercise regularly As a general goal, aim for at least 30 minutes of moderate physical activity every day. Regular physical activity can lower high blood pressure by about 5 to 8 mm Hg.  Eat a healthy diet Eating a diet rich in whole grains, fruits, vegetables, and low-fat dairy products and low in saturated fat and cholesterol. A  healthy diet can lower high blood pressure by up to 11 mm Hg.  Reduce salt (sodium) in your diet Even a small reduction of sodium in the diet can improve heart health and reduce high blood pressure by about 5 to 6 mm Hg.  Limit alcohol One drink equals 12 ounces of beer, 5 ounces of wine, or 1.5 ounces of 80-proof liquor.  Limiting alcohol to less than one drink a day for women or two drinks a day for men can help lower blood pressure by about 4 mm Hg.   If you have any questions or concerns please use My Chart to send questions or call the office at [phone]

## 2023-03-25 DIAGNOSIS — M25611 Stiffness of right shoulder, not elsewhere classified: Secondary | ICD-10-CM | POA: Diagnosis not present

## 2023-03-25 DIAGNOSIS — M7541 Impingement syndrome of right shoulder: Secondary | ICD-10-CM | POA: Diagnosis not present

## 2023-03-25 DIAGNOSIS — R531 Weakness: Secondary | ICD-10-CM | POA: Diagnosis not present

## 2023-03-25 DIAGNOSIS — M19011 Primary osteoarthritis, right shoulder: Secondary | ICD-10-CM | POA: Diagnosis not present

## 2023-03-27 DIAGNOSIS — M7541 Impingement syndrome of right shoulder: Secondary | ICD-10-CM | POA: Diagnosis not present

## 2023-03-27 DIAGNOSIS — M19011 Primary osteoarthritis, right shoulder: Secondary | ICD-10-CM | POA: Diagnosis not present

## 2023-03-27 DIAGNOSIS — R531 Weakness: Secondary | ICD-10-CM | POA: Diagnosis not present

## 2023-03-27 DIAGNOSIS — M25611 Stiffness of right shoulder, not elsewhere classified: Secondary | ICD-10-CM | POA: Diagnosis not present

## 2023-04-01 DIAGNOSIS — M19011 Primary osteoarthritis, right shoulder: Secondary | ICD-10-CM | POA: Diagnosis not present

## 2023-04-01 DIAGNOSIS — M25611 Stiffness of right shoulder, not elsewhere classified: Secondary | ICD-10-CM | POA: Diagnosis not present

## 2023-04-01 DIAGNOSIS — R531 Weakness: Secondary | ICD-10-CM | POA: Diagnosis not present

## 2023-04-01 DIAGNOSIS — M7541 Impingement syndrome of right shoulder: Secondary | ICD-10-CM | POA: Diagnosis not present

## 2023-04-04 DIAGNOSIS — R531 Weakness: Secondary | ICD-10-CM | POA: Diagnosis not present

## 2023-04-04 DIAGNOSIS — M19011 Primary osteoarthritis, right shoulder: Secondary | ICD-10-CM | POA: Diagnosis not present

## 2023-04-04 DIAGNOSIS — M7541 Impingement syndrome of right shoulder: Secondary | ICD-10-CM | POA: Diagnosis not present

## 2023-04-04 DIAGNOSIS — M25611 Stiffness of right shoulder, not elsewhere classified: Secondary | ICD-10-CM | POA: Diagnosis not present

## 2023-04-07 ENCOUNTER — Ambulatory Visit: Payer: Medicare Other

## 2023-04-07 DIAGNOSIS — M25611 Stiffness of right shoulder, not elsewhere classified: Secondary | ICD-10-CM | POA: Diagnosis not present

## 2023-04-07 DIAGNOSIS — M19011 Primary osteoarthritis, right shoulder: Secondary | ICD-10-CM | POA: Diagnosis not present

## 2023-04-07 DIAGNOSIS — M7541 Impingement syndrome of right shoulder: Secondary | ICD-10-CM | POA: Diagnosis not present

## 2023-04-07 DIAGNOSIS — R531 Weakness: Secondary | ICD-10-CM | POA: Diagnosis not present

## 2023-04-09 ENCOUNTER — Encounter: Payer: Self-pay | Admitting: Student

## 2023-04-09 ENCOUNTER — Ambulatory Visit: Payer: Medicare Other | Attending: Cardiology | Admitting: Student

## 2023-04-09 VITALS — BP 120/60 | HR 76 | Ht 66.0 in | Wt 122.0 lb

## 2023-04-09 DIAGNOSIS — I10 Essential (primary) hypertension: Secondary | ICD-10-CM | POA: Diagnosis not present

## 2023-04-09 NOTE — Patient Instructions (Signed)
Changes made by your pharmacist Carmela Hurt, PharmD at today's visit:    Instructions/Changes  (what do you need to do) Your Notes  (what you did and when you did it)  No change to the medications    Increase exercise time from 5 min twice daily to 10 min twice daily    Continue following low salt diet     Bring all of your meds, your BP cuff and your record of home blood pressures to your next appointment.    HOW TO TAKE YOUR BLOOD PRESSURE AT HOME  Rest 5 minutes before taking your blood pressure.  Don't smoke or drink caffeinated beverages for at least 30 minutes before. Take your blood pressure before (not after) you eat. Sit comfortably with your back supported and both feet on the floor (don't cross your legs). Elevate your arm to heart level on a table or a desk. Use the proper sized cuff. It should fit smoothly and snugly around your bare upper arm. There should be enough room to slip a fingertip under the cuff. The bottom edge of the cuff should be 1 inch above the crease of the elbow. Ideally, take 3 measurements at one sitting and record the average.  Important lifestyle changes to control high blood pressure  Intervention  Effect on the BP  Lose extra pounds and watch your waistline Weight loss is one of the most effective lifestyle changes for controlling blood pressure. If you're overweight or obese, losing even a small amount of weight can help reduce blood pressure. Blood pressure might go down by about 1 millimeter of mercury (mm Hg) with each kilogram (about 2.2 pounds) of weight lost.  Exercise regularly As a general goal, aim for at least 30 minutes of moderate physical activity every day. Regular physical activity can lower high blood pressure by about 5 to 8 mm Hg.  Eat a healthy diet Eating a diet rich in whole grains, fruits, vegetables, and low-fat dairy products and low in saturated fat and cholesterol. A healthy diet can lower high blood pressure by up to  11 mm Hg.  Reduce salt (sodium) in your diet Even a small reduction of sodium in the diet can improve heart health and reduce high blood pressure by about 5 to 6 mm Hg.  Limit alcohol One drink equals 12 ounces of beer, 5 ounces of wine, or 1.5 ounces of 80-proof liquor.  Limiting alcohol to less than one drink a day for women or two drinks a day for men can help lower blood pressure by about 4 mm Hg.   If you have any questions or concerns please use My Chart to send questions or call the office at [phone]

## 2023-04-09 NOTE — Progress Notes (Unsigned)
Patient ID: Sara Brown                 DOB: 08-11-1940                      MRN: [id number]      HPI: Sara Brown is a 82 y.o. female referred by Dr. Rosemary Brown to HTN clinic. PMH is significant for angina, CAD, TIA, HTN, T2DM, HLD. Patient intolerant to multiple BP medication.Patient was put on minoxidil on Oct 1, clonidine was stopped due to complaints of hair loss. Patient went to ED for hypertension on Oct 04 - patient was put on doxazosin 1 mg daily  At last visit with PharmD we reviewed correct method of taking B Pat home and encourage patient to check BP at home only once in a day rather checking it frequently and stressing herself out about BP checking. Advised to start using exercise bike twice daily for 5 min. Her home BP in the PM was rising and AM BP was staying too low so advised to switch doxazosin timing to the afternoon.  Patient presented today in good spirit. Reports she forgot to bring her BP monitor for validation. However she brought in her BP log and her BP ~ 125/70 range heart rate 65. She takes doxazosin around 1 pm and tolerates it well. She uses her bike twice daily for 5 min and willing to up the time to 10 min twice daily. Still follows low salt diet. Denies any SOB, chest pain, palpitation and swelling. Reports she gets out of breath when she exercise on her bike for 5 min but with out  discomfort/ chest pain.  She brought in all her vitamins and medications we reviewed them.     Current HTN meds: spironolactone 50 mg daily in the morning, doxazosin 1 mg once a day in the morning  Previously tried: losartan, amlodipine, hydralazine, atenolol, nifedipine, clonidine, reluctant to use carvedilol and labetalol due to history of asthma,clonidine was stopped due to complaints of hair loss and minoxidil was started instead, but she does not want to take it due to concern of having heart disease.   BP goal: <130/80 given advance age and allergic to multiple  BP medication can consider lenient goal <140/90   Family History:  Mother- hypertension Father T2DM Sister- died from MI in her 32's  Other sisters: hypertension Social History:  Alcohol: none  Smoking: none  Diet: does not eat out and follows low salt diet   Exercise: stationary bike - 5 min twice daily and motivated to increase time to 10 min twice daily    Home BP readings: ~125/70 heart rate 65   Wt Readings from Last 3 Encounters:  03/11/23 130 lb (59 kg)  03/04/23 126 lb 12.8 oz (57.5 kg)  02/20/23 127 lb 9.6 oz (57.9 kg)   BP Readings from Last 3 Encounters:  03/21/23 135/70  03/14/23 (!) 146/81  03/11/23 (!) 140/78   Pulse Readings from Last 3 Encounters:  03/21/23 62  03/14/23 64  03/11/23 94    Renal function: CrCl cannot be calculated (Patient's most recent lab result is older than the maximum 21 days allowed.).  Past Medical History:  Diagnosis Date   Age-related osteoporosis without current pathological fracture 08/18/2020   Allergic rhinitis    Angina at rest [facility]) 02/09/2018   Arthritis    "fingers, knees" (02/09/2018)   Asthma    "stopped taking RX after dr said I don't  have this" (02/09/2018)   Basal cell carcinoma    Bradycardia 02/09/2018   Carotid stenosis    ICA(L)   Chest pain due to coronary artery disease 02/09/2018   Chronic bronchitis (HCC)    Chronic constipation 08/18/2020   Complication of anesthesia    "felt the cut w/one of my c-sections" (02/09/2018)   Coronary artery disease involving native coronary artery of native heart without angina pectoris 09/17/2018   Diabetes mellitus type 2, controlled 03/31/2018   EEG abnormal    Essential hypertension 03/31/2018   Generalized anxiety disorder    GERD (gastroesophageal reflux disease)    Hardening of the aorta (main artery of the heart)    Headache    "years ago; before menopause" (02/09/2018)   Hirsutism 06/12/2017   Hyperglycemia due to type 2 diabetes mellitus 03/09/2021    Hyperlipidemia    Hyperpigmentation 02/13/2017   Hypertensive retinopathy    Irritable bowel syndrome    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   Mixed hyperlipidemia 12/29/2020   Myringotomy tube status 04/03/2021   Neck mass 01/17/2017   Neuropathic pain 03/09/2021   OA (osteoarthritis)    Osteoporosis    Palpitations 03/16/2021   Paresthesias 09/05/2020   Personal history of colonic polyps    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     alendronate (FOSAMAX) 70 MG tablet Take 70 mg by mouth every Saturday.     aspirin EC 81 MG tablet Take 1 tablet (81 mg total) by mouth daily. Swallow whole. 90 tablet 3   Calcium Citrate-Vitamin D 315-250 MG-UNIT TABS Take 2 tablets by mouth daily.     carboxymethylcellulose (REFRESH PLUS) 0.5 % SOLN Place 1 drop into both eyes in the morning and at bedtime.     Cholecalciferol (VITAMIN D3) 10 MCG (400 UNIT) CAPS Take 800 Units by mouth daily.     clobetasol cream (TEMOVATE) 0.05 %      Clobetasol Prop Emollient Base (CLOBETASOL PROPIONATE E) 0.05 % emollient cream Apply 1 application topically 2 (two) times daily as needed. 60 g 6   Coenzyme Q10 (COQ10) 200 MG CAPS Take 200 mg by mouth daily.     cycloSPORINE (RESTASIS) 0.05 % ophthalmic emulsion Place 1 drop into both eyes 2 (two) times daily.      doxazosin (CARDURA) 1 MG tablet Take 1 tablet (1 mg total) by mouth daily. 30 tablet 0   fluticasone (FLONASE) 50 MCG/ACT nasal spray Place 1 spray  into both nostrils in the morning and at bedtime.     Fluticasone-Salmeterol (WIXELA INHUB IN) Inhale into the lungs.     Garlic 1000 MG CAPS Take 1,000 mg by mouth daily as needed (high blood pressure).     Magnesium 250 MG TABS Take 250 mg by mouth daily.     Multiple Vitamins-Minerals (CENTRUM SILVER) CHEW See admin instructions.     nitroGLYCERIN (NITROSTAT) 0.4 MG SL tablet Place 1 tablet (0.4 mg total) under the tongue every 5 (five) minutes as  needed for chest pain. 30 tablet 1   NON FORMULARY Garlique 1800     NON FORMULARY Take 1 tablet by mouth daily. Bone up takes one tablet daily     Pitavastatin Calcium (LIVALO) 2 MG TABS TAKE 1 TABLET BY MOUTH EVERY DAY (Patient taking differently: Use as directed 2 mg in the mouth or throat at bedtime.) 336 tablet 1   polyethylene glycol (MIRALAX / GLYCOLAX) packet Take 17 g by mouth daily as needed for moderate constipation.     pyridOXINE (VITAMIN B-6) 100 MG tablet Take 100 mg by mouth daily.      spironolactone (ALDACTONE) 50 MG tablet Take 50 mg by mouth daily.      No current facility-administered medications on file prior to visit.    Allergies  Allergen Reactions   Amlodipine Besylate     Other reaction(s): rash and hives   Hydralazine     Other reaction(s): rash   Losartan Potassium     Other reaction(s): rash and HIves   Naproxen Other (See Comments)    Patient preference  Other reaction(s): upset stomach   Prednisone Other (See Comments)    Patient Preference  Other reaction(s): upset stomach   Amlodipine Hives   Isordil [Isosorbide Nitrate] Rash   Losartan Rash    There were no vitals taken for this visit.   Assessment/Plan:  1. Hypertension -  No problem-specific Assessment & Plan notes found for this encounter.      Thank you  Carmela Hurt, Pharm.D [facility] A Division of [facility] [address] [place], [place] [address]  Phone: [phone]; Fax: [phone]

## 2023-04-09 NOTE — Assessment & Plan Note (Addendum)
Assessment: BP in office BP 120/60  mmHg; given advance age can consider lenient BP goal <140/90   Home BP 125/70 heart rate 65;home BP monitor never validated  Tolerates both BP medications well without s/e, taking doxazosin in the afternoon helps avoiding dizziness in the morning and high BP in the late afternoon  Denies SOB, palpitation, chest pain, headaches,or swelling Follows low salt diet, uses exercise bike 5 min twice daily    Plan:  continue taking doxazosin 1 mg around noon or 1 pm  Continue taking spironolactone 50 mg in the morning  Patient to bring home BP monitor at next OV for validation  Patient to keep record of BP readings with heart rate and report to [place] at the next visit Patient to see PharmD in 5 weeks for follow up  Follow up lab(s): none

## 2023-04-10 DIAGNOSIS — M19011 Primary osteoarthritis, right shoulder: Secondary | ICD-10-CM | POA: Diagnosis not present

## 2023-04-10 DIAGNOSIS — M25611 Stiffness of right shoulder, not elsewhere classified: Secondary | ICD-10-CM | POA: Diagnosis not present

## 2023-04-10 DIAGNOSIS — R531 Weakness: Secondary | ICD-10-CM | POA: Diagnosis not present

## 2023-04-10 DIAGNOSIS — M7541 Impingement syndrome of right shoulder: Secondary | ICD-10-CM | POA: Diagnosis not present

## 2023-04-11 ENCOUNTER — Telehealth: Payer: Self-pay | Admitting: Pharmacist

## 2023-04-11 MED ORDER — DOXAZOSIN MESYLATE 1 MG PO TABS: 1.0000 mg | ORAL_TABLET | Freq: Every day | ORAL | 1 refills | Status: DC

## 2023-04-11 NOTE — Telephone Encounter (Signed)
Pt called in requesting a refill on doxazosin, however it is not on her med list anymore. Please advise if she should still be taking.

## 2023-04-11 NOTE — Telephone Encounter (Signed)
Patient should remain on doxazosin. Refill sent to pharmacy.

## 2023-04-15 DIAGNOSIS — M19011 Primary osteoarthritis, right shoulder: Secondary | ICD-10-CM | POA: Diagnosis not present

## 2023-04-15 DIAGNOSIS — M7541 Impingement syndrome of right shoulder: Secondary | ICD-10-CM | POA: Diagnosis not present

## 2023-04-15 DIAGNOSIS — M25611 Stiffness of right shoulder, not elsewhere classified: Secondary | ICD-10-CM | POA: Diagnosis not present

## 2023-04-15 DIAGNOSIS — R531 Weakness: Secondary | ICD-10-CM | POA: Diagnosis not present

## 2023-04-16 DIAGNOSIS — L810 Postinflammatory hyperpigmentation: Secondary | ICD-10-CM | POA: Diagnosis not present

## 2023-04-16 DIAGNOSIS — L304 Erythema intertrigo: Secondary | ICD-10-CM | POA: Diagnosis not present

## 2023-04-16 DIAGNOSIS — D1801 Hemangioma of skin and subcutaneous tissue: Secondary | ICD-10-CM | POA: Diagnosis not present

## 2023-04-17 ENCOUNTER — Encounter: Payer: Self-pay | Admitting: Allergy

## 2023-04-17 ENCOUNTER — Ambulatory Visit (INDEPENDENT_AMBULATORY_CARE_PROVIDER_SITE_OTHER): Payer: Medicare Other | Admitting: Allergy

## 2023-04-17 ENCOUNTER — Other Ambulatory Visit: Payer: Self-pay

## 2023-04-17 VITALS — BP 132/78 | HR 70 | Temp 97.9°F | Resp 20 | Ht 65.75 in | Wt 127.0 lb

## 2023-04-17 DIAGNOSIS — J310 Chronic rhinitis: Secondary | ICD-10-CM | POA: Diagnosis not present

## 2023-04-17 DIAGNOSIS — Z888 Allergy status to other drugs, medicaments and biological substances status: Secondary | ICD-10-CM | POA: Diagnosis not present

## 2023-04-17 DIAGNOSIS — J4520 Mild intermittent asthma, uncomplicated: Secondary | ICD-10-CM | POA: Diagnosis not present

## 2023-04-17 DIAGNOSIS — T50905D Adverse effect of unspecified drugs, medicaments and biological substances, subsequent encounter: Secondary | ICD-10-CM

## 2023-04-17 MED ORDER — RYALTRIS 665-25 MCG/ACT NA SUSP: 2.0000 | Freq: Two times a day (BID) | NASAL | 5 refills | Status: DC | PRN

## 2023-04-17 MED ORDER — ALBUTEROL SULFATE HFA 108 (90 BASE) MCG/ACT IN AERS: 2.0000 | INHALATION_SPRAY | RESPIRATORY_TRACT | 1 refills | Status: AC | PRN

## 2023-04-17 NOTE — Patient Instructions (Addendum)
Adverse Drug Reaction  Adverse effects after taking Minoxidil.  Symptoms after discussion and review of records does not seem consistent with IgE mediated drug allergy thus would continue to avoid  -Avoid Minoxidil.  Asthmatic Bronchitis Infrequent episodes of cough, shortness of breath episodes about twice a year -Refill Albuterol inhaler for rescue use during episodes. Usealbuterol inhaler 2 puffs every 4-6 hours as needed for cough/wheeze/shortness of breath/chest tightness.  May use 15-20 minutes prior to activity.   Monitor frequency of use.   -Perform spirometry in office today to assess lung function.  Postnasal Drip Chronic phlegm production and throat clearing, possibly due to environmental allergies. -Will try to see if we can get Ryaltris nasal spray 2 sprays each nostril twice a day as needed for runny nose, post-nasal drip or congestion.  This is a combination spray with an antihistamine component for runny nose control and a steroid component for congestion control.  -Stop Fluticasone while use Ryaltris.   -If Ryaltris is not covered by insurance then you can use Fluticasone as below and use Astepro 2 sprays each nostril as needed for runny nose control.   -Reduce use of Fluticasone nasal spray to as-needed for nasal congestion.   Fluticasone 2 sprays each nostril daily for 1-2 weeks at a time before stopping once nasal congestion improves for maximum benefit -Recommend you schedule for allergy skin testing visit to identify potential environmental triggers. Hold antihistamines for 3 days prior to this visit.   Schedule a skin testing visit for environmental allergies

## 2023-04-17 NOTE — Progress Notes (Signed)
New Patient Note  RE: [name] MRN: [id number] DOB: [birth year] Date of Office Visit: 04/17/2023 Primary care provider: Renford Dills, MD  Chief Complaint: unsure why she is here  History of present illness: Sara Brown is a 82 y.o. female presenting today for evaluation of adverse medication effect.  Discussed the use of AI scribe software for clinical note transcription with the patient, who gave verbal consent to proceed.  The patient, with a history of hypertension, type 2 diabetes, hyperlipidemia, GERD, CAD, was referred for a consultation due to a medication reaction. She reported experiencing adverse effects from several antihypertensive medications, including clonidine, hydralazine as well as minoxidil most recent. The reaction to minoxidil was severe enough to warrant an emergency room visit.  She has no longer taking the minoxidil.  Per the ED visit on 03/04/2023 she reported being on the clonidine but was noting some hair loss and was told to stop and was recommended to take minoxidil.  The first time she took the minoxidil she felt like her lips and tongue were numb but not swollen and felt pain in her forehead as well as what felt like some shortness of breath.  She denies any rash, wheezing, chest pain.  The patient also reported skin reactions, including rashes and hives, from other blood pressure medications. She is currently on doxazosin, which seems to be controlling her blood pressure without causing adverse effects.  In addition to hypertension, the patient has a history of respiratory issues. She reported infrequent asthma attacks, occurring approximately once or twice a year. These episodes are characterized by difficulty breathing, chest tightness, and a cough. The patient also reported experiencing postnasal drip, which she manages with daily use of fluticasone nasal spray.  The patient also reported a history of bronchitis, which was diagnosed  during an urgent care visit. She noted that these episodes of bronchitis often coincide with her asthma attacks. The patient has an albuterol inhaler, but reported that she rarely uses it, even during these episodes.  The patient expressed concern about the frequency of her throat clearing, which she believes may be related to her postnasal drip. She also mentioned a potential dust allergy, as she has not had her home vents cleaned in a while. She has previously undergone allergy testing, but the results were not discussed during this consultation.     Review of systems: 10pt ROS negative unless noted above in HPI  All other systems negative unless noted above in HPI  Past medical history: Past Medical History:  Diagnosis Date   Age-related osteoporosis without current pathological fracture 08/18/2020   Allergic rhinitis    Angina at rest [facility]) 02/09/2018   Arthritis    "fingers, knees" (02/09/2018)   Asthma    "stopped taking RX after dr said I don't have this" (02/09/2018)   Basal cell carcinoma    Bradycardia 02/09/2018   Carotid stenosis    ICA(L)   Chest pain due to coronary artery disease 02/09/2018   Chronic bronchitis (HCC)    Chronic constipation 08/18/2020   Complication of anesthesia    "felt the cut w/one of my c-sections" (02/09/2018)   Coronary artery disease involving native coronary artery of native heart without angina pectoris 09/17/2018   Diabetes mellitus type 2, controlled 03/31/2018   Eczema    EEG abnormal    Essential hypertension 03/31/2018   Generalized anxiety disorder    GERD (gastroesophageal reflux disease)    Hardening of the aorta (main artery of  the heart)    Headache    "years ago; before menopause" (02/09/2018)   Hirsutism 06/12/2017   Hyperglycemia due to type 2 diabetes mellitus 03/09/2021   Hyperlipidemia    Hyperpigmentation 02/13/2017   Hypertensive retinopathy    Irritable bowel syndrome    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   Mixed hyperlipidemia 12/29/2020   Myringotomy tube status 04/03/2021   Neck mass 01/17/2017   Neuropathic pain 03/09/2021   OA (osteoarthritis)    Osteoporosis    Palpitations 03/16/2021   Paresthesias 09/05/2020   Personal history of colonic polyps    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   Pure hypercholesterolemia 08/18/2020   Shortness of breath 07/30/2007   Skin laxity 06/12/2017   TIA (transient ischemic attack) ?2005   Urticaria    Vitamin D deficiency     Past surgical history: Past Surgical History:  Procedure Laterality Date   CATARACT EXTRACTION W/ INTRAOCULAR LENS  IMPLANT, BILATERAL Bilateral    CESAREAN SECTION  1971; 1975   CORONARY PRESSURE/FFR STUDY N/A 04/01/2018   Procedure: INTRAVASCULAR PRESSURE WIRE/FFR STUDY;  Surgeon: Elder Negus, MD;  Location: [facility] INVASIVE CV LAB;  Service: Cardiovascular;  Laterality: N/A;   LEFT HEART CATH AND CORONARY ANGIOGRAPHY N/A 04/01/2018   Procedure: LEFT HEART CATH AND CORONARY ANGIOGRAPHY;  Surgeon: Elder Negus, MD;  Location: [facility] INVASIVE CV LAB;  Service: Cardiovascular;  Laterality: N/A;   TUBAL LIGATION  1975   TYMPANOSTOMY TUBE PLACEMENT Left 03/01/2021    Family history:  Family History  Problem Relation Age of Onset   Diabetes Mellitus II Mother    Osteoporosis Mother    Hypertension Mother    Diabetes Mellitus II Father    CVA Sister    CAD Sister    Hypertension Sister    Osteoarthritis Sister    Alcoholism Brother    Epilepsy Brother    Breast cancer Cousin 18   Dementia Cousin        suspected; never confirmed   Allergic rhinitis Neg Hx    Angioedema Neg Hx    Asthma Neg Hx    Eczema Neg Hx    Urticaria Neg Hx     Social history: Lives in a home with out carpeting with central cooling.  No pets in the home.  There is no concern for roaches but she states there are water  bugs.  She does not work at this time.  She denies a smoking history.   Medication List: Current Outpatient Medications  Medication Sig Dispense Refill   albuterol (PROVENTIL HFA;VENTOLIN HFA) 108 (90 Base) MCG/ACT inhaler Inhale 1 puff into the lungs every 4 (four) hours as needed for wheezing.     alendronate (FOSAMAX) 70 MG tablet Take 70 mg by mouth every Saturday.     aspirin EC 81 MG tablet Take 1 tablet (81 mg total) by mouth daily. Swallow whole. 90 tablet 3   Calcium Citrate-Vitamin D 315-250 MG-UNIT TABS Take 2 tablets by mouth daily.     carboxymethylcellulose (REFRESH PLUS) 0.5 % SOLN Place 1 drop into both eyes in the morning and at bedtime.     Cholecalciferol (VITAMIN D3) 10 MCG (400 UNIT) CAPS Take 800 Units by mouth daily.     Coenzyme Q10 (COQ10) 200 MG CAPS Take 200 mg by mouth daily.     cycloSPORINE (RESTASIS) 0.05 % ophthalmic emulsion Place  1 drop into both eyes 2 (two) times daily.      doxazosin (CARDURA) 1 MG tablet Take 1 tablet (1 mg total) by mouth daily. 90 tablet 1   fluticasone (FLONASE) 50 MCG/ACT nasal spray Place 1 spray into both nostrils in the morning and at bedtime.     Fluticasone-Salmeterol (WIXELA INHUB IN) Inhale into the lungs.     ketoconazole (NIZORAL) 2 % cream Apply 1 Application topically 2 (two) times daily.     Magnesium 250 MG TABS Take 250 mg by mouth daily.     Multiple Vitamins-Minerals (CENTRUM SILVER) CHEW See admin instructions.     NON FORMULARY Take 1 tablet by mouth daily. Bone up takes one tablet daily     Pitavastatin Calcium (LIVALO) 2 MG TABS TAKE 1 TABLET BY MOUTH EVERY DAY (Patient taking differently: Use as directed 2 mg in the mouth or throat at bedtime.) 336 tablet 1   polyethylene glycol (MIRALAX / GLYCOLAX) packet Take 17 g by mouth daily as needed for moderate constipation.     pyridOXINE (VITAMIN B-6) 100 MG tablet Take 100 mg by mouth daily.      spironolactone (ALDACTONE) 50 MG tablet Take 50 mg by mouth daily.       clobetasol cream (TEMOVATE) 0.05 %  (Patient not taking: Reported on 04/17/2023)     Clobetasol Prop Emollient Base (CLOBETASOL PROPIONATE E) 0.05 % emollient cream Apply 1 application topically 2 (two) times daily as needed. (Patient not taking: Reported on 04/17/2023) 60 g 6   Garlic 1000 MG CAPS Take 1,000 mg by mouth daily as needed (high blood pressure). (Patient not taking: Reported on 04/17/2023)     nitroGLYCERIN (NITROSTAT) 0.4 MG SL tablet Place 1 tablet (0.4 mg total) under the tongue every 5 (five) minutes as needed for chest pain. 30 tablet 1   NON FORMULARY Garlique 1800 (Patient not taking: Reported on 04/17/2023)     No current facility-administered medications for this visit.    Known medication allergies: Allergies  Allergen Reactions   Amlodipine Besylate     Other reaction(s): rash and hives   Hydralazine     Other reaction(s): rash   Losartan Potassium     Other reaction(s): rash and HIves   Naproxen Other (See Comments)    Patient preference  Other reaction(s): upset stomach   Prednisone Other (See Comments)    Patient Preference  Other reaction(s): upset stomach   Amlodipine Hives   Isordil [Isosorbide Nitrate] Rash   Losartan Rash     Physical examination: Blood pressure 132/78, pulse 70, temperature 97.9 F (36.6 C), temperature source Temporal, resp. rate 20, height 5' 5.75" (1.67 m), weight 127 lb (57.6 kg), SpO2 98%.  General: Alert, interactive, in no acute distress. HEENT: PERRLA, TMs pearly gray, turbinates non-edematous with clear discharge, post-pharynx non erythematous. Neck: Supple without lymphadenopathy. Lungs: Clear to auscultation without wheezing, rhonchi or rales. {no increased work of breathing. CV: Normal S1, S2 without murmurs. Abdomen: Nondistended, nontender. Skin: Warm and dry, without lesions or rashes. Extremities:  No clubbing, cyanosis or edema. Neuro:   Grossly intact.  Diagnositics/Labs: Spirometry: FEV1 1.72 L 86%, FVC  2.25 L or 85% predicted nonobstructive pattern  Assessment and plan: Adverse Drug Reaction  Adverse effects after taking Minoxidil.  Symptoms after discussion and review of records does not seem consistent with IgE mediated drug allergy thus would continue to avoid  -Avoid Minoxidil.  Asthmatic Bronchitis Infrequent episodes of cough, shortness of breath episodes about twice  a year -Refill Albuterol inhaler for rescue use during episodes. Usealbuterol inhaler 2 puffs every 4-6 hours as needed for cough/wheeze/shortness of breath/chest tightness.  May use 15-20 minutes prior to activity.   Monitor frequency of use.   -Perform spirometry in office today to assess lung function.  Postnasal Drip Chronic phlegm production and throat clearing, possibly due to environmental allergies. -Will try to see if we can get Ryaltris nasal spray 2 sprays each nostril twice a day as needed for runny nose, post-nasal drip or congestion.  This is a combination spray with an antihistamine component for runny nose control and a steroid component for congestion control.  -Stop Fluticasone while use Ryaltris.   -If Ryaltris is not covered by insurance then you can use Fluticasone as below and use Astepro 2 sprays each nostril as needed for runny nose control.   -Reduce use of Fluticasone nasal spray to as-needed for nasal congestion.   Fluticasone 2 sprays each nostril daily for 1-2 weeks at a time before stopping once nasal congestion improves for maximum benefit -Recommend you schedule for allergy skin testing visit to identify potential environmental triggers. Hold antihistamines for 3 days prior to this visit.   Schedule a skin testing visit for environmental allergies 1-55  I appreciate the opportunity to take part in Charlett's care. Please do not hesitate to contact me with questions.  Sincerely,   Margo Aye, MD Allergy/Immunology [facility]

## 2023-04-23 DIAGNOSIS — L6681 Central centrifugal cicatricial alopecia: Secondary | ICD-10-CM | POA: Diagnosis not present

## 2023-04-23 DIAGNOSIS — L659 Nonscarring hair loss, unspecified: Secondary | ICD-10-CM | POA: Diagnosis not present

## 2023-04-24 ENCOUNTER — Ambulatory Visit: Payer: Medicare Other | Admitting: Allergy

## 2023-04-25 ENCOUNTER — Encounter: Payer: Self-pay | Admitting: Allergy

## 2023-04-25 ENCOUNTER — Ambulatory Visit (INDEPENDENT_AMBULATORY_CARE_PROVIDER_SITE_OTHER): Payer: Medicare Other | Admitting: Allergy

## 2023-04-25 DIAGNOSIS — J310 Chronic rhinitis: Secondary | ICD-10-CM

## 2023-04-25 MED ORDER — RYALTRIS 665-25 MCG/ACT NA SUSP: 2.0000 | Freq: Two times a day (BID) | NASAL | 4 refills | Status: DC | PRN

## 2023-04-25 NOTE — Patient Instructions (Addendum)
Adverse Drug Reaction  Adverse effects after taking Minoxidil.  Symptoms after discussion and review of records does not seem consistent with IgE mediated drug allergy thus would continue to avoid  -Avoid Minoxidil.  Asthmatic Bronchitis Infrequent episodes of cough, shortness of breath episodes about twice a year -use Albuterol inhaler for rescue use during episodes. Usealbuterol inhaler 2 puffs every 4-6 hours as needed for cough/wheeze/shortness of breath/chest tightness.  May use 15-20 minutes prior to activity.   Monitor frequency of use.    Postnasal Drip Chronic phlegm production and throat clearing, possibly due to environmental allergies. -Use nasal steroid Nasacort 2 sprays each nostril daily for 1-2 weeks at a time before stopping once nasal congestion improves for maximum benefit.  -Use Astelin 2 sprays each nostril twice a day as needed for runny nose or nasal drainage/throat drip.    -if the 2 nasal sprays above cost more then the Ryaltris combination spray then get the Ryaltris filled at the specialty pharmacy, [facility] [facility].    - Ryaltris nasal spray 2 sprays each nostril twice a day as needed for runny nose, post-nasal drip or congestion.  This is a combination spray with an antihistamine component for runny nose control and a steroid component for congestion control.   -Skin testing today is negative thus obtaining environmental allergy testing via labwork Will call with these results  Follow-up in 4-6 months or sooner if needed

## 2023-04-25 NOTE — Progress Notes (Signed)
Maintenance:   Follow-up Note  RE: Sara Brown MRN: [id number] DOB: 28-Dec-1940 Date of Office Visit: 04/25/2023   History of present illness: Sara Brown is a 82 y.o. female presenting today for skin testing.  She was last seen in the office on 04/17/2023 by myself for rhinitis.  She is in her usual state of health today.  She has not had any antihistamines for the past 3 days for testing today.   She states she did get a call from the specialty pharmacy for Ryaltris and said that they told her she could qualify for a coupon card that would bring the cost of the medication down to $30.  However she was not sure about what this was for and if this was a durable price for her so she did not get this filled.  She now does not recall the number as she did not get it from her phone and would like it to be resent.  Medication List: Current Outpatient Medications  Medication Sig Dispense Refill   Olopatadine-Mometasone (RYALTRIS) 665-25 MCG/ACT SUSP Place 2 sprays into the nose 2 (two) times daily as needed (as needed for runny nose, post-nasal drip or congestion.). 29 g 4   albuterol (VENTOLIN HFA) 108 (90 Base) MCG/ACT inhaler Inhale 2 puffs into the lungs every 4 (four) hours as needed for wheezing or shortness of breath (2 puffs every 4-6 hours as needed for cough/wheeze/shortness of breath/chest tightness.  May use 15-20 minutes prior to activity.). 6.7 g 1   alendronate (FOSAMAX) 70 MG tablet Take 70 mg by mouth every Saturday.     aspirin EC 81 MG tablet Take 1 tablet (81 mg total) by mouth daily. Swallow whole. 90 tablet 3   Calcium Citrate-Vitamin D 315-250 MG-UNIT TABS Take 2 tablets by mouth daily.     carboxymethylcellulose (REFRESH PLUS) 0.5 % SOLN Place 1 drop into both eyes in the morning and at bedtime.     Cholecalciferol (VITAMIN D3) 10 MCG (400 UNIT) CAPS Take 800 Units by mouth daily.     clobetasol cream (TEMOVATE) 0.05 %  (Patient not taking: Reported on  04/17/2023)     Clobetasol Prop Emollient Base (CLOBETASOL PROPIONATE E) 0.05 % emollient cream Apply 1 application topically 2 (two) times daily as needed. (Patient not taking: Reported on 04/17/2023) 60 g 6   Coenzyme Q10 (COQ10) 200 MG CAPS Take 200 mg by mouth daily.     cycloSPORINE (RESTASIS) 0.05 % ophthalmic emulsion Place 1 drop into both eyes 2 (two) times daily.      doxazosin (CARDURA) 1 MG tablet Take 1 tablet (1 mg total) by mouth daily. 90 tablet 1   fluticasone (FLONASE) 50 MCG/ACT nasal spray Place 1 spray into both nostrils in the morning and at bedtime.     Fluticasone-Salmeterol (WIXELA INHUB IN) Inhale into the lungs.     Garlic 1000 MG CAPS Take 1,000 mg by mouth daily as needed (high blood pressure). (Patient not taking: Reported on 04/17/2023)     ketoconazole (NIZORAL) 2 % cream Apply 1 Application topically 2 (two) times daily.     Magnesium 250 MG TABS Take 250 mg by mouth daily.     Multiple Vitamins-Minerals (CENTRUM SILVER) CHEW See admin instructions.     nitroGLYCERIN (NITROSTAT) 0.4 MG SL tablet Place 1 tablet (0.4 mg total) under the tongue every 5 (five) minutes as needed for chest pain. 30 tablet 1   NON FORMULARY Garlique 1800 (Patient not taking: Reported on  04/17/2023)     NON FORMULARY Take 1 tablet by mouth daily. Bone up takes one tablet daily     Olopatadine-Mometasone (RYALTRIS) 665-25 MCG/ACT SUSP Place 2 sprays into the nose 2 (two) times daily as needed (as needed for runny nose, post-nasal drip or congestion). 29 g 5   Pitavastatin Calcium (LIVALO) 2 MG TABS TAKE 1 TABLET BY MOUTH EVERY DAY (Patient taking differently: Use as directed 2 mg in the mouth or throat at bedtime.) 336 tablet 1   polyethylene glycol (MIRALAX / GLYCOLAX) packet Take 17 g by mouth daily as needed for moderate constipation.     pyridOXINE (VITAMIN B-6) 100 MG tablet Take 100 mg by mouth daily.      spironolactone (ALDACTONE) 50 MG tablet Take 50 mg by mouth daily.      No current  facility-administered medications for this visit.     Known medication allergies: Allergies  Allergen Reactions   Amlodipine Besylate     Other reaction(s): rash and hives   Hydralazine     Other reaction(s): rash   Losartan Potassium     Other reaction(s): rash and HIves   Naproxen Other (See Comments)    Patient preference  Other reaction(s): upset stomach   Prednisone Other (See Comments)    Patient Preference  Other reaction(s): upset stomach   Amlodipine Hives   Isordil [Isosorbide Nitrate] Rash   Losartan Rash     Physical examination:  General: Alert, interactive, in no acute distress. Skin: Warm and dry, without lesions or rashes. Extremities:  No clubbing, cyanosis or edema. Neuro:   Grossly intact.  Diagnositics/Labs:  Allergy testing:   Airborne Adult Perc - 04/25/23 1008     Time Antigen Placed 0930    Allergen Manufacturer Waynette Buttery    Location Back    Number of Test 55    1. Control-Buffer 50% Glycerol Negative    2. Control-Histamine Negative    3. Bahia Negative    4. French Southern Territories Negative    5. Johnson Negative    6. [place] Blue Negative    7. Meadow Fescue Negative    8. Perennial Rye Negative    9. Timothy Negative    10. Ragweed Mix Negative    11. Cocklebur Negative    12. Plantain,  English Negative    13. Baccharis Negative    14. Dog Fennel Negative    15. Russian Thistle Negative    16. Lamb's Quarters Negative    17. Sheep Sorrell Negative    18. Rough Pigweed Negative    19. Marsh Elder, Rough Negative    20. Mugwort, Common Negative    21. Box, Elder Negative    23. Sweet Gum Negative    24. Pecan Pollen Negative    25. Pine Mix Negative    26. Walnut, Black Pollen Negative    27. Red Mulberry Negative    28. Ash Mix Negative    29. Birch Mix Negative    30. Beech American Negative    31. Cottonwood, Guinea[place]issau Negative    32. Hickory, White Negative    33. Maple Mix Negative    34. Oak, [place] Mix Negative    35. Sycamore  Eastern Negative    36. Alternaria Alternata Negative    37. Cladosporium Herbarum Negative    38. Aspergillus Mix Negative    39. Penicillium Mix Negative    40. Bipolaris Sorokiniana (Helminthosporium) Negative    41. Drechslera Spicifera (Curvularia) Negative    42.  Mucor Plumbeus Negative    43. Fusarium Moniliforme Negative    44. Aureobasidium Pullulans (pullulara) Negative    45. Rhizopus Oryzae Negative    46. Botrytis Cinera Negative    47. Epicoccum Nigrum Negative    48. Phoma Betae Negative    49. Dust Mite Mix Negative    50. Cat Hair 10,000 BAU/ml Negative    51.  Dog Epithelia Negative    52. Mixed Feathers Negative    53. Horse Epithelia Negative    54. Cockroach, German Negative    55. Tobacco Leaf Negative             Allergy testing results were read and interpreted by provider, documented by clinical staff.   Assessment and plan:   Adverse Drug Reaction  Adverse effects after taking Minoxidil.  Symptoms after discussion and review of records does not seem consistent with IgE mediated drug allergy thus would continue to avoid  -Avoid Minoxidil.  Asthmatic Bronchitis Infrequent episodes of cough, shortness of breath episodes about twice a year -use Albuterol inhaler for rescue use during episodes. Usealbuterol inhaler 2 puffs every 4-6 hours as needed for cough/wheeze/shortness of breath/chest tightness.  May use 15-20 minutes prior to activity.   Monitor frequency of use.    Other rhinitis/postnasal Drip Chronic phlegm production and throat clearing, possibly due to environmental allergies. -Use nasal steroid Nasacort 2 sprays each nostril daily for 1-2 weeks at a time before stopping once nasal congestion improves for maximum benefit.  -Use Astelin 2 sprays each nostril twice a day as needed for runny nose or nasal drainage/throat drip.    -if the 2 nasal sprays above cost more then the Ryaltris combination spray then get the Ryaltris filled at the  specialty pharmacy, [facility].    - Ryaltris nasal spray 2 sprays each nostril twice a day as needed for runny nose, post-nasal drip or congestion.  This is a combination spray with an antihistamine component for runny nose control and a steroid component for congestion control.   -Skin testing today is negative thus obtaining environmental allergy testing via labwork Will call with these results  Follow-up in 4-6 months or sooner if needed   I appreciate the opportunity to take part in Brielynn's care. Please do not hesitate to contact me with questions.  Sincerely,   Margo Aye, MD Allergy/Immunology [facility]

## 2023-04-28 ENCOUNTER — Ambulatory Visit (INDEPENDENT_AMBULATORY_CARE_PROVIDER_SITE_OTHER): Payer: Medicare Other | Admitting: Podiatry

## 2023-04-28 ENCOUNTER — Encounter: Payer: Self-pay | Admitting: Podiatry

## 2023-04-28 DIAGNOSIS — M79675 Pain in left toe(s): Secondary | ICD-10-CM | POA: Diagnosis not present

## 2023-04-28 DIAGNOSIS — M79674 Pain in right toe(s): Secondary | ICD-10-CM | POA: Diagnosis not present

## 2023-04-28 DIAGNOSIS — E1142 Type 2 diabetes mellitus with diabetic polyneuropathy: Secondary | ICD-10-CM | POA: Diagnosis not present

## 2023-04-28 DIAGNOSIS — B351 Tinea unguium: Secondary | ICD-10-CM

## 2023-04-28 DIAGNOSIS — L84 Corns and callosities: Secondary | ICD-10-CM

## 2023-04-28 LAB — ALLERGENS W/TOTAL IGE AREA 2

## 2023-04-28 NOTE — Progress Notes (Signed)
Subjective:  Patient ID: Sara Brown, female    DOB: [birth year],   MRN: [id number]  Chief Complaint  Patient presents with   Nail Problem    Dfc.    82 y.o. female presents for concern of thickened elongated and painful nails that are difficult to trim. Requesting to have them trimmed today. Relates burning and tingling in their feet. Patient is diabetic and last A1c was  Lab Results  Component Value Date   HGBA1C 5.9 (H) 11/14/2020   .   PCP:  Renford Dills, MD    . Denies any other pedal complaints. Denies n/v/f/c.   Past Medical History:  Diagnosis Date   Age-related osteoporosis without current pathological fracture 08/18/2020   Allergic rhinitis    Angina at rest [facility]) 02/09/2018   Arthritis    "fingers, knees" (02/09/2018)   Asthma    "stopped taking RX after dr said I don't have this" (02/09/2018)   Basal cell carcinoma    Bradycardia 02/09/2018   Carotid stenosis    ICA(L)   Chest pain due to coronary artery disease 02/09/2018   Chronic bronchitis (HCC)    Chronic constipation 08/18/2020   Complication of anesthesia    "felt the cut w/one of my c-sections" (02/09/2018)   Coronary artery disease involving native coronary artery of native heart without angina pectoris 09/17/2018   Diabetes mellitus type 2, controlled 03/31/2018   Eczema    EEG abnormal    Essential hypertension 03/31/2018   Generalized anxiety disorder    GERD (gastroesophageal reflux disease)    Hardening of the aorta (main artery of the heart)    Headache    "years ago; before menopause" (02/09/2018)   Hirsutism 06/12/2017   Hyperglycemia due to type 2 diabetes mellitus 03/09/2021   Hyperlipidemia    Hyperpigmentation 02/13/2017   Hypertensive retinopathy    Irritable bowel syndrome    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   Mixed hyperlipidemia 12/29/2020    Myringotomy tube status 04/03/2021   Neck mass 01/17/2017   Neuropathic pain 03/09/2021   OA (osteoarthritis)    Osteoporosis    Palpitations 03/16/2021   Paresthesias 09/05/2020   Personal history of colonic polyps    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 CFT <3 seconds. Absent hair growth on digits. Edema noted to bilateral lower extremities. Xerosis noted bilaterally.  Skin. No lacerations or abrasions bilateral feet. Nails 1-5 bilateral  are thickened discolored and elongated with subungual debris. Hyperkeratotic tissue noted to sub metatarsal first head on right.  Musculoskeletal: MMT 5/5 bilateral lower extremities in DF, PF, Inversion and Eversion. Deceased ROM in DF of ankle joint.  Neurological: Sensation intact to light touch. Protective sensation diminished bilateral.    Assessment:   1. Pain due to onychomycosis of toenails of both feet   2. Diabetic peripheral neuropathy associated with type 2 diabetes mellitus (HCC)   3. Corns and callosities      Plan:  Patient was evaluated and treated and all questions answered. -Discussed and educated patient on diabetic foot care, especially with  regards to the vascular, neurological and musculoskeletal systems.  -Stressed the importance  of good glycemic control and the detriment of not  controlling glucose levels in relation to the foot. -Discussed supportive shoes at all times and checking feet regularly.  -Hyperkeratotic tissue right foot sub first metatarsal head and dorsum of second digit  debrided without incident with chisel.  -Mechanically debrided all nails 1-5 bilateral using sterile nail nipper and filed with dremel without incident  -Answered all patient  questions -Discussed topicals for neuropathy pains.  -Patient to return  in 3 months   for at risk foot care -Patient advised to call the office if any problems or questions arise in the meantime.   Louann Sjogren, DPM

## 2023-05-13 DIAGNOSIS — Z4802 Encounter for removal of sutures: Secondary | ICD-10-CM | POA: Diagnosis not present

## 2023-05-13 DIAGNOSIS — L6681 Central centrifugal cicatricial alopecia: Secondary | ICD-10-CM | POA: Diagnosis not present

## 2023-05-14 ENCOUNTER — Encounter: Payer: Self-pay | Admitting: Student

## 2023-05-14 ENCOUNTER — Ambulatory Visit: Payer: Medicare Other | Attending: Internal Medicine | Admitting: Student

## 2023-05-14 VITALS — BP 128/70 | HR 72

## 2023-05-14 DIAGNOSIS — I10 Essential (primary) hypertension: Secondary | ICD-10-CM | POA: Diagnosis not present

## 2023-05-14 NOTE — Assessment & Plan Note (Signed)
Assessment: BP in office BP 127/69 mmHg; given advance age can consider lenient BP goal <140/90   Home BP  ~ 115-120/67-75 heart rate 67, monitor validated today found to be accurate  Tolerates both BP medications well without s/e, taking doxazosin in the afternoon helps avoiding dizziness in the morning and high BP in the late afternoon  Denies SOB, palpitation, chest pain, headaches,or swelling Follows low salt diet, uses exercise bike 5 min twice daily    Plan:  continue taking doxazosin 1 mg around noon or 1 pm  Continue taking spironolactone 50 mg in the morning  Patient to bring home BP monitor at next OV for validation  Since BP at goal advised patient to contact us if BP persistently stays above the goal  Follow up with Dr.Patwardhan in 6 months

## 2023-05-14 NOTE — Progress Notes (Unsigned)
Patient ID: Sara Brown                 DOB: 1940-10-16                      MRN: [id number]      HPI: Sara Brown is a 83 y.o. female referred by Dr. Rosemary Holms to HTN clinic. PMH is significant for angina, CAD, TIA, HTN, T2DM, HLD. Patient intolerant to multiple BP medication.Patient was put on minoxidil on Oct 1, clonidine was stopped due to complaints of hair loss. Patient went to ED for hypertension on Oct 04 - patient was put on doxazosin 1 mg daily  At last visit with PharmD we reviewed correct method of taking B Pat home and encourage patient to check BP at home only once in a day rather checking it frequently and stressing herself out about BP checking. Advised to start using exercise bike twice daily for 5 min. Her home BP in the PM was rising and AM BP was staying too low so advised to switch doxazosin timing to the afternoon.  Patient presented today for BP follow up. She brought in her home monitor for validation. It is fairly accurate. Her home BP ~ 115-120/67-75 heart rate 67. She takes her BP medication regularly and tolerates them well.  Still follow low salt diet and uses her exercise bike regularly   135/72 heart rate 71 1st on home monitor  127/69 heart rate 71  2nd on home monitor  127/69 heart rate 72  3rd on home monitor  128/70 heart rate 72  1st on office cuff  124/72 heart rate 72 2nd on office cuff    Current HTN meds: spironolactone 50 mg daily in the morning, doxazosin 1 mg once a day in the afternoon  Previously tried: losartan, amlodipine, hydralazine, atenolol, nifedipine, clonidine, reluctant to use carvedilol and labetalol due to history of asthma,clonidine was stopped due to complaints of hair loss and minoxidil was started instead, but she does not want to take it due to concern of having heart disease.   BP goal: <130/80 given advance age and allergic to multiple BP medication can consider lenient goal <140/90  135/72 heart rate 71   127/69 heart rate 71  127/69 heart rate 72  128/70 heart rate 72   Family History:  Mother- hypertension Father T2DM Sister- died from MI in her 79's  Other sisters: hypertension Social History:  Alcohol: none  Smoking: none  Diet: does not eat out and follows low salt diet   Exercise: stationary bike - 5 min twice daily and motivated to increase time to 10 min twice daily    Home BP readings: ~125/70 heart rate 65   Wt Readings from Last 3 Encounters:  04/17/23 127 lb (57.6 kg)  04/09/23 122 lb (55.3 kg)  03/11/23 130 lb (59 kg)   BP Readings from Last 3 Encounters:  05/14/23 128/70  04/17/23 132/78  04/09/23 120/60   Pulse Readings from Last 3 Encounters:  05/14/23 72  04/17/23 70  04/09/23 76    Renal function: CrCl cannot be calculated (Patient's most recent lab result is older than the maximum 21 days allowed.).  Past Medical History:  Diagnosis Date   Age-related osteoporosis without current pathological fracture 08/18/2020   Allergic rhinitis    Angina at rest [facility]) 02/09/2018   Arthritis    "fingers, knees" (02/09/2018)   Asthma    "stopped taking RX after dr said  I don't have this" (02/09/2018)   Basal cell carcinoma    Bradycardia 02/09/2018   Carotid stenosis    ICA(L)   Chest pain due to coronary artery disease 02/09/2018   Chronic bronchitis (HCC)    Chronic constipation 08/18/2020   Complication of anesthesia    "felt the cut w/one of my c-sections" (02/09/2018)   Coronary artery disease involving native coronary artery of native heart without angina pectoris 09/17/2018   Diabetes mellitus type 2, controlled 03/31/2018   Eczema    EEG abnormal    Essential hypertension 03/31/2018   Generalized anxiety disorder    GERD (gastroesophageal reflux disease)    Hardening of the aorta (main artery of the heart)    Headache    "years ago; before menopause" (02/09/2018)   Hirsutism 06/12/2017   Hyperglycemia due to type 2 diabetes mellitus 03/09/2021    Hyperlipidemia    Hyperpigmentation 02/13/2017   Hypertensive retinopathy    Irritable bowel syndrome    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   Mixed hyperlipidemia 12/29/2020   Myringotomy tube status 04/03/2021   Neck mass 01/17/2017   Neuropathic pain 03/09/2021   OA (osteoarthritis)    Osteoporosis    Palpitations 03/16/2021   Paresthesias 09/05/2020   Personal history of colonic polyps    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   Pure hypercholesterolemia 08/18/2020   Shortness of breath 07/30/2007   Skin laxity 06/12/2017   TIA (transient ischemic attack) ?2005   Urticaria    Vitamin D deficiency     Current Outpatient Medications on File Prior to Visit  Medication Sig Dispense Refill   albuterol (VENTOLIN HFA) 108 (90 Base) MCG/ACT inhaler Inhale 2 puffs into the lungs every 4 (four) hours as needed for wheezing or shortness of breath (2 puffs every 4-6 hours as needed for cough/wheeze/shortness of breath/chest tightness.  May use 15-20 minutes prior to activity.). 6.7 g 1   alendronate (FOSAMAX) 70 MG tablet Take 70 mg by mouth every Saturday.     aspirin EC 81 MG tablet Take 1 tablet (81 mg total) by mouth daily. Swallow whole. 90 tablet 3   Calcium Citrate-Vitamin D 315-250 MG-UNIT TABS Take 2 tablets by mouth daily.     carboxymethylcellulose (REFRESH PLUS) 0.5 % SOLN Place 1 drop into both eyes in the morning and at bedtime.     Cholecalciferol (VITAMIN D3) 10 MCG (400 UNIT) CAPS Take 800 Units by mouth daily.     clobetasol cream (TEMOVATE) 0.05 %  (Patient not taking: Reported on 04/17/2023)     Clobetasol Prop Emollient Base (CLOBETASOL PROPIONATE E) 0.05 % emollient cream Apply 1 application topically 2 (two) times daily as needed. (Patient not taking: Reported on 04/17/2023) 60 g 6   Coenzyme Q10 (COQ10) 200 MG CAPS Take 200 mg by mouth daily.      cycloSPORINE (RESTASIS) 0.05 % ophthalmic emulsion Place 1 drop into both eyes 2 (two) times daily.      doxazosin (CARDURA) 1 MG tablet Take 1 tablet (1 mg total) by mouth daily. 90 tablet 1   fluticasone (FLONASE) 50 MCG/ACT nasal spray Place 1 spray into both nostrils in the morning and at bedtime.     Fluticasone-Salmeterol (WIXELA INHUB IN) Inhale into the lungs.     Garlic 1000 MG CAPS Take 1,000 mg by mouth daily as needed (high blood pressure). (Patient not taking: Reported  on 04/17/2023)     ketoconazole (NIZORAL) 2 % cream Apply 1 Application topically 2 (two) times daily.     Magnesium 250 MG TABS Take 250 mg by mouth daily.     Multiple Vitamins-Minerals (CENTRUM SILVER) CHEW See admin instructions.     nitroGLYCERIN (NITROSTAT) 0.4 MG SL tablet Place 1 tablet (0.4 mg total) under the tongue every 5 (five) minutes as needed for chest pain. 30 tablet 1   NON FORMULARY Garlique 1800 (Patient not taking: Reported on 04/17/2023)     NON FORMULARY Take 1 tablet by mouth daily. Bone up takes one tablet daily     Olopatadine-Mometasone (RYALTRIS) 665-25 MCG/ACT SUSP Place 2 sprays into the nose 2 (two) times daily as needed (as needed for runny nose, post-nasal drip or congestion). (Patient not taking: Reported on 05/14/2023) 29 g 5   Olopatadine-Mometasone (RYALTRIS) 665-25 MCG/ACT SUSP Place 2 sprays into the nose 2 (two) times daily as needed (as needed for runny nose, post-nasal drip or congestion.). (Patient not taking: Reported on 05/14/2023) 29 g 4   Pitavastatin Calcium (LIVALO) 2 MG TABS TAKE 1 TABLET BY MOUTH EVERY DAY (Patient taking differently: Use as directed 2 mg in the mouth or throat at bedtime.) 336 tablet 1   polyethylene glycol (MIRALAX / GLYCOLAX) packet Take 17 g by mouth daily as needed for moderate constipation.     pyridOXINE (VITAMIN B-6) 100 MG tablet Take 100 mg by mouth daily.      spironolactone (ALDACTONE) 50 MG tablet Take 50 mg by mouth daily.      No current  facility-administered medications on file prior to visit.    Allergies  Allergen Reactions   Amlodipine Besylate     Other reaction(s): rash and hives   Hydralazine     Other reaction(s): rash   Losartan Potassium     Other reaction(s): rash and HIves   Naproxen Other (See Comments)    Patient preference  Other reaction(s): upset stomach   Prednisone Other (See Comments)    Patient Preference  Other reaction(s): upset stomach   Amlodipine Hives   Isordil [Isosorbide Nitrate] Rash   Losartan Rash    Blood pressure 128/70, pulse 72, SpO2 98%.   Assessment/Plan:  1. Hypertension -  Essential hypertension Assessment: BP in office BP 127/69 mmHg; given advance age can consider lenient BP goal <140/90   Home BP  ~ 115-120/67-75 heart rate 67, monitor validated today found to be accurate  Tolerates both BP medications well without s/e, taking doxazosin in the afternoon helps avoiding dizziness in the morning and high BP in the late afternoon  Denies SOB, palpitation, chest pain, headaches,or swelling Follows low salt diet, uses exercise bike 5 min twice daily    Plan:  continue taking doxazosin 1 mg around noon or 1 pm  Continue taking spironolactone 50 mg in the morning  Patient to bring home BP monitor at next OV for validation  Since BP at goal advised patient to contact us if BP persistently stays above the goal  Follow up with Dr.Patwardhan in 6 months      Thank you  Carmela Hurt, Pharm.D [facility] Division of [facility] [address] [place], [place] [address]  Phone: [phone]; Fax: [phone]

## 2023-05-14 NOTE — Patient Instructions (Signed)
No Changes made by your pharmacist Carmela Hurt, PharmD at today's visit:  If BP start staying above goal >140/80 call office to schedule appointment with pharmacist     Bring all of your meds, your BP cuff and your record of home blood pressures to your next appointment.    HOW TO TAKE YOUR BLOOD PRESSURE AT HOME  Rest 5 minutes before taking your blood pressure.  Don't smoke or drink caffeinated beverages for at least 30 minutes before. Take your blood pressure before (not after) you eat. Sit comfortably with your back supported and both feet on the floor (don't cross your legs). Elevate your arm to heart level on a table or a desk. Use the proper sized cuff. It should fit smoothly and snugly around your bare upper arm. There should be enough room to slip a fingertip under the cuff. The bottom edge of the cuff should be 1 inch above the crease of the elbow. Ideally, take 3 measurements at one sitting and record the average.  Important lifestyle changes to control high blood pressure  Intervention  Effect on the BP  Lose extra pounds and watch your waistline Weight loss is one of the most effective lifestyle changes for controlling blood pressure. If you're overweight or obese, losing even a small amount of weight can help reduce blood pressure. Blood pressure might go down by about 1 millimeter of mercury (mm Hg) with each kilogram (about 2.2 pounds) of weight lost.  Exercise regularly As a general goal, aim for at least 30 minutes of moderate physical activity every day. Regular physical activity can lower high blood pressure by about 5 to 8 mm Hg.  Eat a healthy diet Eating a diet rich in whole grains, fruits, vegetables, and low-fat dairy products and low in saturated fat and cholesterol. A healthy diet can lower high blood pressure by up to 11 mm Hg.  Reduce salt (sodium) in your diet Even a small reduction of sodium in the diet can improve heart health and reduce high blood pressure  by about 5 to 6 mm Hg.  Limit alcohol One drink equals 12 ounces of beer, 5 ounces of wine, or 1.5 ounces of 80-proof liquor.  Limiting alcohol to less than one drink a day for women or two drinks a day for men can help lower blood pressure by about 4 mm Hg.   If you have any questions or concerns please use My Chart to send questions or call the office at [phone]

## 2023-05-19 ENCOUNTER — Telehealth: Payer: Self-pay | Admitting: Pharmacist

## 2023-05-19 NOTE — Telephone Encounter (Signed)
Patient called states yesterday day she took her doxazosin around 9:30 pm instead of 1 pm wondering if she can take her spironolactone this morning and what time she should take her doxazosin.  Advised to resume spironolactone and doxazosin at usual time. This morning BP was 127/84.

## 2023-05-20 ENCOUNTER — Other Ambulatory Visit: Payer: Self-pay

## 2023-05-20 ENCOUNTER — Emergency Department (HOSPITAL_COMMUNITY)
Admission: EM | Admit: 2023-05-20 | Discharge: 2023-05-21 | Disposition: A | Discharge status: Home / Self Care | Payer: Medicare Other | Attending: Emergency Medicine | Admitting: Emergency Medicine

## 2023-05-20 ENCOUNTER — Encounter (HOSPITAL_COMMUNITY): Payer: Self-pay

## 2023-05-20 ENCOUNTER — Emergency Department (HOSPITAL_COMMUNITY): Payer: Medicare Other

## 2023-05-20 DIAGNOSIS — J45909 Unspecified asthma, uncomplicated: Secondary | ICD-10-CM | POA: Diagnosis not present

## 2023-05-20 DIAGNOSIS — Z8673 Personal history of transient ischemic attack (TIA), and cerebral infarction without residual deficits: Secondary | ICD-10-CM | POA: Insufficient documentation

## 2023-05-20 DIAGNOSIS — Z7951 Long term (current) use of inhaled steroids: Secondary | ICD-10-CM | POA: Diagnosis not present

## 2023-05-20 DIAGNOSIS — Z7982 Long term (current) use of aspirin: Secondary | ICD-10-CM | POA: Diagnosis not present

## 2023-05-20 DIAGNOSIS — I2510 Atherosclerotic heart disease of native coronary artery without angina pectoris: Secondary | ICD-10-CM | POA: Diagnosis not present

## 2023-05-20 DIAGNOSIS — Z79899 Other long term (current) drug therapy: Secondary | ICD-10-CM | POA: Diagnosis not present

## 2023-05-20 DIAGNOSIS — R0789 Other chest pain: Secondary | ICD-10-CM

## 2023-05-20 DIAGNOSIS — E119 Type 2 diabetes mellitus without complications: Secondary | ICD-10-CM | POA: Insufficient documentation

## 2023-05-20 DIAGNOSIS — R079 Chest pain, unspecified: Secondary | ICD-10-CM | POA: Diagnosis not present

## 2023-05-20 DIAGNOSIS — Z7984 Long term (current) use of oral hypoglycemic drugs: Secondary | ICD-10-CM | POA: Diagnosis not present

## 2023-05-20 DIAGNOSIS — I10 Essential (primary) hypertension: Secondary | ICD-10-CM | POA: Insufficient documentation

## 2023-05-20 DIAGNOSIS — R030 Elevated blood-pressure reading, without diagnosis of hypertension: Secondary | ICD-10-CM

## 2023-05-20 LAB — CBC
HCT: 39.5 % (ref 36.0–46.0)
Hemoglobin: 13 g/dL (ref 12.0–15.0)
MCH: 29.7 pg (ref 26.0–34.0)
MCHC: 32.9 g/dL (ref 30.0–36.0)
MCV: 90.4 fL (ref 80.0–100.0)
Platelets: 201 10*3/uL (ref 150–400)
RBC: 4.37 MIL/uL (ref 3.87–5.11)
RDW: 13.6 % (ref 11.5–15.5)
WBC: 6 10*3/uL (ref 4.0–10.5)
nRBC: 0 % (ref 0.0–0.2)

## 2023-05-20 LAB — BASIC METABOLIC PANEL
Anion gap: 8 (ref 5–15)
BUN: 20 mg/dL (ref 8–23)
CO2: 24 mmol/L (ref 22–32)
Calcium: 9.8 mg/dL (ref 8.9–10.3)
Chloride: 105 mmol/L (ref 98–111)
Creatinine, Ser: 0.72 mg/dL (ref 0.44–1.00)
GFR, Estimated: >60 mL/min (ref >60)
Glucose, Bld: 98 mg/dL (ref 70–99)
Potassium: 4 mmol/L (ref 3.5–5.1)
Sodium: 137 mmol/L (ref 135–145)

## 2023-05-20 LAB — TROPONIN I (HIGH SENSITIVITY): Troponin I (High Sensitivity): 3 ng/L (ref <18)

## 2023-05-20 NOTE — ED Triage Notes (Signed)
Pt states that she keeps a BP log at home and became concerned when she had 3 consistently high measurements with the most concerning being 166/90. She does take medication and has not missed any doses. Pt reports a "funny feeling" in chest earlier, but denied specific pain and/or SOB.

## 2023-05-21 LAB — TROPONIN I (HIGH SENSITIVITY): Troponin I (High Sensitivity): 3 ng/L (ref <18)

## 2023-05-21 NOTE — ED Provider Notes (Signed)
[place] EMERGENCY DEPARTMENT AT [facility] Provider Note  CSN: [id number] Arrival date & time: 05/20/23 1857  Chief Complaint(s) Hypertension  HPI Sara Brown is a 82 y.o. female with a past medical history listed below who presents to the emergency department with elevated blood pressure readings at home with systolics in the 160-170s.  Patient reports being at her normal state of health.  Began having chest discomfort after several readings where the same.  Patient is unable to describe the chest discomfort stating that she just felt funny in her chest.  There is no alleviating or aggravating factors.  Reports that the funny feeling last seconds to minutes.  Resolves on its own.  Last felt several hours ago.  Currently asymptomatic.  No associated shortness of breath.  No nausea or vomiting.  No headache, vision changes, focal deficits.  No other physical complaints.  Patient reports being compliant with her medication.  The history is provided by the patient.    Past Medical History Past Medical History:  Diagnosis Date   Age-related osteoporosis without current pathological fracture 08/18/2020   Allergic rhinitis    Angina at rest [facility]) 02/09/2018   Arthritis    "fingers, knees" (02/09/2018)   Asthma    "stopped taking RX after dr said I don't have this" (02/09/2018)   Basal cell carcinoma    Bradycardia 02/09/2018   Carotid stenosis    ICA(L)   Chest pain due to coronary artery disease 02/09/2018   Chronic bronchitis (HCC)    Chronic constipation 08/18/2020   Complication of anesthesia    "felt the cut w/one of my c-sections" (02/09/2018)   Coronary artery disease involving native coronary artery of native heart without angina pectoris 09/17/2018   Diabetes mellitus type 2, controlled 03/31/2018   Eczema    EEG abnormal    Essential hypertension 03/31/2018   Generalized anxiety disorder    GERD (gastroesophageal reflux disease)    Hardening of the  aorta (main artery of the heart)    Headache    "years ago; before menopause" (02/09/2018)   Hirsutism 06/12/2017   Hyperglycemia due to type 2 diabetes mellitus 03/09/2021   Hyperlipidemia    Hyperpigmentation 02/13/2017   Hypertensive retinopathy    Irritable bowel syndrome    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   Mixed hyperlipidemia 12/29/2020   Myringotomy tube status 04/03/2021   Neck mass 01/17/2017   Neuropathic pain 03/09/2021   OA (osteoarthritis)    Osteoporosis    Palpitations 03/16/2021   Paresthesias 09/05/2020   Personal history of colonic polyps    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   Pure hypercholesterolemia 08/18/2020   Shortness of breath 07/30/2007   Skin laxity 06/12/2017   TIA (transient ischemic attack) ?2005   Urticaria    Vitamin D deficiency    Patient Active Problem List   Diagnosis Date Noted   Precordial pain 01/23/2022   Numbness 01/23/2022   Decreased estrogen level 12/24/2021   Sacroiliitis, not elsewhere classified (HCC) 12/24/2021   Vitamin D deficiency 08/31/2021   Generalized anxiety disorder    EEG abnormal    Hypertensive retinopathy    History of colonic polyps    Irritable bowel syndrome    Hardening of the aorta (main artery of the heart)    Myringotomy tube status 04/03/2021   Palpitations 03/16/2021   Hyperglycemia due to type 2  diabetes mellitus 03/09/2021   Neuropathic pain 03/09/2021   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   Mixed hyperlipidemia 12/29/2020   TIA (transient ischemic attack) 11/13/2020   Paresthesias 09/05/2020   Age-related osteoporosis without current pathological fracture 08/18/2020   Pure hypercholesterolemia 08/18/2020   Chronic constipation 08/18/2020   Coronary artery  disease involving native coronary artery of native heart without angina pectoris 09/17/2018   Diabetes mellitus type 2, controlled 03/31/2018   Essential hypertension 03/31/2018   Chest pain due to coronary artery disease (HCC) 02/09/2018   Angina at rest 02/09/2018   Bradycardia 02/09/2018   Skin laxity 06/12/2017   Hirsutism 06/12/2017   Hyperpigmentation 02/13/2017   Neck mass 01/17/2017   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   Asymmetrical hearing loss 02/21/2016   GERD (gastroesophageal reflux disease)    Shortness of breath 07/30/2007   Home Medication(s) Prior to Admission medications   Medication Sig Start Date End Date Taking? Authorizing Provider  albuterol (VENTOLIN HFA) 108 (90 Base) MCG/ACT inhaler Inhale 2 puffs into the lungs every 4 (four) hours as needed for wheezing or shortness of breath (2 puffs every 4-6 hours as needed for cough/wheeze/shortness of breath/chest tightness.  May use 15-20 minutes prior to activity.). 04/17/23  Yes Marcelyn Bruins, MD  alendronate (FOSAMAX) 70 MG tablet Take 70 mg by mouth every Saturday. 08/09/18  Yes [provider]  aspirin EC 81 MG tablet Take 1 tablet (81 mg total) by mouth daily. Swallow whole. 01/07/22  Yes Patwardhan, Anabel Bene, MD  Calcium Citrate-Vitamin D 315-250 MG-UNIT TABS Take 1 tablet by mouth daily.   Yes [provider]  carboxymethylcellulose (REFRESH PLUS) 0.5 % SOLN Place 1 drop into both eyes in the morning and at bedtime.   Yes [provider]  Cholecalciferol (VITAMIN D3) 10 MCG (400 UNIT) CAPS Take 400 Units by mouth daily.   Yes [provider]  Coenzyme Q10 (COQ10) 200 MG CAPS Take 200 mg by mouth daily.   Yes [provider]  cycloSPORINE (RESTASIS) 0.05 % ophthalmic emulsion Place 1 drop into both eyes 2 (two) times daily.    Yes [provider]  doxazosin (CARDURA) 1 MG tablet Take 1 tablet (1 mg total) by mouth daily. 04/11/23  Yes Patwardhan, Manish  J, MD  fluticasone (FLONASE) 50 MCG/ACT nasal spray Place 1 spray into both nostrils in the morning and at bedtime.   Yes [provider]  fluticasone-salmeterol (WIXELA INHUB) 100-50 MCG/ACT AEPB Inhale 1 puff into the lungs 2 (two) times daily.   Yes [provider]  Magnesium 250 MG TABS Take 250 mg by mouth daily.   Yes [provider]  Multiple Vitamins-Minerals (CENTRUM SILVER) CHEW Chew 1 tablet by mouth daily.   Yes [provider]  nitroGLYCERIN (NITROSTAT) 0.4 MG SL tablet Place 0.4 mg under the tongue every 5 (five) minutes as needed for chest pain.   Yes [provider]  Pitavastatin Calcium (LIVALO) 2 MG TABS TAKE 1 TABLET BY MOUTH EVERY DAY Patient taking differently: Use as directed 2 mg in the mouth or throat at bedtime. 04/25/22  Yes Patwardhan, Manish J, MD  polyethylene glycol (MIRALAX / GLYCOLAX) packet Take 17 g by mouth daily as needed for moderate constipation.   Yes [provider]  pyridOXINE (VITAMIN B-6) 100 MG tablet Take 100 mg by mouth daily.    Yes [provider]  spironolactone (ALDACTONE) 50 MG tablet Take 50 mg by mouth daily.    Yes [provider]  Clobetasol Prop Emollient Base (CLOBETASOL PROPIONATE E) 0.05 % emollient cream Apply 1 application topically 2 (two) times daily as needed. Patient not taking: Reported on 04/17/2023 07/02/21   Janalyn Harder, MD  nitroGLYCERIN (NITROSTAT) 0.4 MG SL tablet Place 1 tablet (0.4 mg total) under the tongue every 5 (five) minutes as needed for chest pain. 11/14/22 02/20/23  Patwardhan, Anabel Bene, MD  Olopatadine-Mometasone (RYALTRIS) [phone] MCG/ACT SUSP Place 2 sprays into the nose 2 (two) times daily as needed (as needed for runny nose, post-nasal drip or congestion). Patient not taking: Reported on 05/14/2023 04/17/23   Marcelyn Bruins, MD  Olopatadine-Mometasone Cristal Generous) [phone] MCG/ACT SUSP Place 2 sprays into the nose 2 (two) times daily as  needed (as needed for runny nose, post-nasal drip or congestion.). Patient not taking: Reported on 05/14/2023 04/25/23   Marcelyn Bruins, MD                                                                                                                                    Allergies Amlodipine besylate, Hydralazine, Losartan potassium, Naproxen, Prednisone, Amlodipine, Isordil [isosorbide nitrate], and Losartan  Review of Systems Review of Systems As noted in HPI  Physical Exam Vital Signs  I have reviewed the triage vital signs BP (!) 142/79   Pulse 60   Temp 98 F (36.7 C) (Oral)   Resp 17   SpO2 100%   Physical Exam Vitals reviewed.  Constitutional:      General: She is not in acute distress.    Appearance: She is well-developed. She is not diaphoretic.  HENT:     Head: Normocephalic and atraumatic.     Nose: Nose normal.  Eyes:     General: No scleral icterus.       Right eye: No discharge.        Left eye: No discharge.     Conjunctiva/sclera: Conjunctivae normal.     Pupils: Pupils are equal, round, and reactive to light.  Cardiovascular:     Rate and Rhythm: Normal rate and regular rhythm.     Heart sounds: No murmur heard.    No friction rub. No gallop.  Pulmonary:     Effort: Pulmonary effort is normal. No respiratory distress.     Breath sounds: Normal breath sounds. No stridor. No rales.  Abdominal:     General: There is no distension.     Palpations: Abdomen is soft.     Tenderness: There is no abdominal tenderness.  Musculoskeletal:        General: No tenderness.     Cervical back: Normal range of motion and neck supple.  Skin:    General: Skin is warm and dry.     Findings: No erythema or rash.  Neurological:     Mental Status: She  is alert and oriented to person, place, and time.     ED Results and Treatments Labs (all labs ordered are listed, but only abnormal results are displayed) Labs Reviewed  BASIC METABOLIC PANEL  CBC   TROPONIN I (HIGH SENSITIVITY)  TROPONIN I (HIGH SENSITIVITY)                                                                                                                         EKG  EKG Interpretation Date/Time:  Tuesday May 20 2023 19:45:16 EST Ventricular Rate:  62 PR Interval:  164 QRS Duration:  94 QT Interval:  398 QTC Calculation: 405 R Axis:   -29  Text Interpretation: Sinus rhythm Borderline left axis deviation Abnormal R-wave progression, late transition No significant change was found Confirmed by Drema Pry [id number]) on 05/20/2023 11:06:43 PM       Radiology DG Chest 2 View  Result Date: 05/20/2023 CLINICAL DATA:  Chest pain EXAM: CHEST - 2 VIEW COMPARISON:  03/04/2023 FINDINGS: Heart and mediastinal contours are within normal limits. No focal opacities or effusions. No acute bony abnormality. IMPRESSION: No active cardiopulmonary disease. Electronically Signed   By: Charlett Nose M.D.   On: 05/20/2023 20:05    Medications Ordered in ED Medications - No data to display Procedures Procedures  (including critical care time) Medical Decision Making / ED Course   Medical Decision Making Amount and/or Complexity of Data Reviewed Labs: ordered. Radiology: ordered.    Patient presents for elevated blood pressure readings at home. Asymptomatic prior to that. Reports "funny" sensation in the chest.  DDx and work up noted below:  EKG without acute ischemic changes or evidence of pericarditis.  Serial troponins negative x 2.  Highly atypical and unlikely for ACS.  Doubt PE.  Doubt dissection. Chest x-ray without evidence of pneumonia, pneumothorax, pulmonary edema pleural effusions. CBC without leukocytosis or anemia.  No significant electrolyte derangements or renal sufficiency.  Blood pressure readings here in the 140-150s without intervention.    Final Clinical Impression(s) / ED Diagnoses Final diagnoses:  Elevated blood pressure reading  Chest  discomfort   The patient appears reasonably screened and/or stabilized for discharge and I doubt any other medical condition or other [facility] requiring further screening, evaluation, or treatment in the ED at this time. I have discussed the findings, Dx and Tx plan with the patient/family who expressed understanding and agree(s) with the plan. Discharge instructions discussed at length. The patient/family was given strict return precautions who verbalized understanding of the instructions. No further questions at time of discharge.  Disposition: Discharge  Condition: Good  ED Discharge Orders     None        Follow Up: Renford Dills, MD [address] [place] [address] [phone]  Call  to schedule an appointment for close follow up    This chart was dictated using voice recognition software.  Despite best efforts to proofread,  errors can occur which can change  the documentation meaning.    Nira Conn, MD 05/21/23 [phone]

## 2023-05-22 ENCOUNTER — Other Ambulatory Visit: Payer: Self-pay | Admitting: Cardiology

## 2023-05-22 DIAGNOSIS — I2510 Atherosclerotic heart disease of native coronary artery without angina pectoris: Secondary | ICD-10-CM

## 2023-05-22 DIAGNOSIS — E119 Type 2 diabetes mellitus without complications: Secondary | ICD-10-CM | POA: Diagnosis not present

## 2023-05-22 DIAGNOSIS — M792 Neuralgia and neuritis, unspecified: Secondary | ICD-10-CM | POA: Diagnosis not present

## 2023-05-22 DIAGNOSIS — M8580 Other specified disorders of bone density and structure, unspecified site: Secondary | ICD-10-CM | POA: Diagnosis not present

## 2023-05-22 DIAGNOSIS — E782 Mixed hyperlipidemia: Secondary | ICD-10-CM

## 2023-05-22 DIAGNOSIS — E7800 Pure hypercholesterolemia, unspecified: Secondary | ICD-10-CM | POA: Diagnosis not present

## 2023-05-22 DIAGNOSIS — I10 Essential (primary) hypertension: Secondary | ICD-10-CM | POA: Diagnosis not present

## 2023-05-23 ENCOUNTER — Other Ambulatory Visit: Payer: Self-pay | Admitting: Cardiology

## 2023-05-23 DIAGNOSIS — I10 Essential (primary) hypertension: Secondary | ICD-10-CM

## 2023-05-26 ENCOUNTER — Telehealth: Payer: Self-pay | Admitting: Pharmacist

## 2023-05-26 NOTE — Telephone Encounter (Signed)
Not feeling well. Her BP is elevated but not that bad. She will lie down for now. Her BP at endocrinologist was 124/62 on 05/22/2023.  Will reach out to [place] if BP persistently stays >140/90.

## 2023-05-26 NOTE — Telephone Encounter (Signed)
Pt states she was told to call in to pharmD when her bp top number is above 140. Pt called in stating her bp 151/82 hr 59. She states anytime her bp goes up her left leg and face always feels "strange". She states she took her aspirin and spironolactone. Please advise.

## 2023-05-29 DIAGNOSIS — Z9622 Myringotomy tube(s) status: Secondary | ICD-10-CM | POA: Diagnosis not present

## 2023-05-29 DIAGNOSIS — H6123 Impacted cerumen, bilateral: Secondary | ICD-10-CM | POA: Diagnosis not present

## 2023-06-17 DIAGNOSIS — M25569 Pain in unspecified knee: Secondary | ICD-10-CM | POA: Diagnosis not present

## 2023-06-17 DIAGNOSIS — M545 Low back pain, unspecified: Secondary | ICD-10-CM | POA: Diagnosis not present

## 2023-06-17 DIAGNOSIS — I10 Essential (primary) hypertension: Secondary | ICD-10-CM | POA: Diagnosis not present

## 2023-06-18 DIAGNOSIS — M545 Low back pain, unspecified: Secondary | ICD-10-CM | POA: Diagnosis not present

## 2023-06-18 DIAGNOSIS — I517 Cardiomegaly: Secondary | ICD-10-CM | POA: Diagnosis not present

## 2023-06-18 DIAGNOSIS — M4316 Spondylolisthesis, lumbar region: Secondary | ICD-10-CM | POA: Diagnosis not present

## 2023-06-24 DIAGNOSIS — M545 Low back pain, unspecified: Secondary | ICD-10-CM | POA: Diagnosis not present

## 2023-06-24 DIAGNOSIS — R1032 Left lower quadrant pain: Secondary | ICD-10-CM | POA: Diagnosis not present

## 2023-06-27 DIAGNOSIS — R1032 Left lower quadrant pain: Secondary | ICD-10-CM | POA: Diagnosis not present

## 2023-07-03 DIAGNOSIS — M169 Osteoarthritis of hip, unspecified: Secondary | ICD-10-CM | POA: Diagnosis not present

## 2023-07-15 DIAGNOSIS — R531 Weakness: Secondary | ICD-10-CM | POA: Diagnosis not present

## 2023-07-15 DIAGNOSIS — M1612 Unilateral primary osteoarthritis, left hip: Secondary | ICD-10-CM | POA: Diagnosis not present

## 2023-07-15 DIAGNOSIS — M25652 Stiffness of left hip, not elsewhere classified: Secondary | ICD-10-CM | POA: Diagnosis not present

## 2023-07-18 DIAGNOSIS — R531 Weakness: Secondary | ICD-10-CM | POA: Diagnosis not present

## 2023-07-18 DIAGNOSIS — M25652 Stiffness of left hip, not elsewhere classified: Secondary | ICD-10-CM | POA: Diagnosis not present

## 2023-07-18 DIAGNOSIS — M1612 Unilateral primary osteoarthritis, left hip: Secondary | ICD-10-CM | POA: Diagnosis not present

## 2023-07-22 ENCOUNTER — Emergency Department (HOSPITAL_COMMUNITY)
Admission: EM | Admit: 2023-07-22 | Discharge: 2023-07-22 | Disposition: A | Discharge status: Home / Self Care | Payer: Medicare Other | Attending: Emergency Medicine | Admitting: Emergency Medicine

## 2023-07-22 ENCOUNTER — Encounter (HOSPITAL_COMMUNITY): Payer: Self-pay

## 2023-07-22 ENCOUNTER — Emergency Department (HOSPITAL_COMMUNITY): Payer: Medicare Other

## 2023-07-22 ENCOUNTER — Other Ambulatory Visit: Payer: Self-pay

## 2023-07-22 DIAGNOSIS — Z79899 Other long term (current) drug therapy: Secondary | ICD-10-CM | POA: Insufficient documentation

## 2023-07-22 DIAGNOSIS — M25652 Stiffness of left hip, not elsewhere classified: Secondary | ICD-10-CM | POA: Diagnosis not present

## 2023-07-22 DIAGNOSIS — M1612 Unilateral primary osteoarthritis, left hip: Secondary | ICD-10-CM | POA: Diagnosis not present

## 2023-07-22 DIAGNOSIS — R079 Chest pain, unspecified: Secondary | ICD-10-CM | POA: Diagnosis not present

## 2023-07-22 DIAGNOSIS — R0789 Other chest pain: Secondary | ICD-10-CM | POA: Insufficient documentation

## 2023-07-22 DIAGNOSIS — R001 Bradycardia, unspecified: Secondary | ICD-10-CM | POA: Diagnosis not present

## 2023-07-22 DIAGNOSIS — R918 Other nonspecific abnormal finding of lung field: Secondary | ICD-10-CM | POA: Diagnosis not present

## 2023-07-22 DIAGNOSIS — R531 Weakness: Secondary | ICD-10-CM | POA: Diagnosis not present

## 2023-07-22 DIAGNOSIS — Z7982 Long term (current) use of aspirin: Secondary | ICD-10-CM | POA: Diagnosis not present

## 2023-07-22 LAB — TROPONIN I (HIGH SENSITIVITY)
Troponin I (High Sensitivity): 4 ng/L (ref <18)
Troponin I (High Sensitivity): 4 ng/L (ref <18)

## 2023-07-22 LAB — COMPREHENSIVE METABOLIC PANEL
ALT: 24 U/L (ref 0–44)
AST: 23 U/L (ref 15–41)
Albumin: 3.9 g/dL (ref 3.5–5.0)
Alkaline Phosphatase: 38 U/L (ref 38–126)
Anion gap: 9 (ref 5–15)
BUN: 18 mg/dL (ref 8–23)
CO2: 24 mmol/L (ref 22–32)
Calcium: 9.5 mg/dL (ref 8.9–10.3)
Chloride: 105 mmol/L (ref 98–111)
Creatinine, Ser: 0.82 mg/dL (ref 0.44–1.00)
GFR, Estimated: >60 mL/min (ref >60)
Glucose, Bld: 87 mg/dL (ref 70–99)
Potassium: 3.8 mmol/L (ref 3.5–5.1)
Sodium: 138 mmol/L (ref 135–145)
Total Bilirubin: 1.4 mg/dL — ABNORMAL HIGH (ref 0.0–1.2)
Total Protein: 6.7 g/dL (ref 6.5–8.1)

## 2023-07-22 LAB — CBC WITH DIFFERENTIAL/PLATELET
Abs Immature Granulocytes: 0.01 10*3/uL (ref 0.00–0.07)
Basophils Absolute: 0 10*3/uL (ref 0.0–0.1)
Basophils Relative: 0 %
Eosinophils Absolute: 0.1 10*3/uL (ref 0.0–0.5)
Eosinophils Relative: 1 %
HCT: 36.3 % (ref 36.0–46.0)
Hemoglobin: 11.9 g/dL — ABNORMAL LOW (ref 12.0–15.0)
Immature Granulocytes: 0 %
Lymphocytes Relative: 23 %
Lymphs Abs: 1.2 10*3/uL (ref 0.7–4.0)
MCH: 29.8 pg (ref 26.0–34.0)
MCHC: 32.8 g/dL (ref 30.0–36.0)
MCV: 91 fL (ref 80.0–100.0)
Monocytes Absolute: 0.5 10*3/uL (ref 0.1–1.0)
Monocytes Relative: 10 %
Neutro Abs: 3.4 10*3/uL (ref 1.7–7.7)
Neutrophils Relative %: 66 %
Platelets: 163 10*3/uL (ref 150–400)
RBC: 3.99 MIL/uL (ref 3.87–5.11)
RDW: 14.3 % (ref 11.5–15.5)
WBC: 5.2 10*3/uL (ref 4.0–10.5)
nRBC: 0 % (ref 0.0–0.2)

## 2023-07-22 LAB — TSH: TSH: 1.747 u[IU]/mL (ref 0.350–4.500)

## 2023-07-22 LAB — T4, FREE: Free T4: 0.83 ng/dL (ref 0.61–1.12)

## 2023-07-22 MED ORDER — DOXAZOSIN MESYLATE 1 MG PO TABS
1.0000 mg | ORAL_TABLET | Freq: Once | ORAL | Status: AC
  Administered 2023-07-22: 1 mg via ORAL
  Filled 2023-07-22: qty 1

## 2023-07-22 NOTE — ED Triage Notes (Signed)
PT BIB [facility] from a orthopedic/PT department where is she currently being treated for arthritis.PT is c/o of discomfort in chest 3/10. SOB and was put on 2L by the office.EMS gave 3 baby ASA and PT is now only c/o of hip pain. PT is normally bradycardic

## 2023-07-22 NOTE — ED Provider Notes (Signed)
[facility] EMERGENCY DEPARTMENT AT [facility] Provider Note   CSN: [id number] Arrival date & time: 07/22/23  1119     History  Chief Complaint  Patient presents with   Chest Pain    Sara Brown is a 83 y.o. female.  HPI 83 year old female presents with chest discomfort.  She has numerous comorbidities which include but are not limited to CAD, hypertension, diabetes, hyperlipidemia, GERD.  She was getting physical therapy for her legs and had about 30 minutes of chest discomfort.  This started during one of the exercises and so she had to stop but the discomfort lingered until EMS gave her 3 baby aspirin (she had taken a baby aspirin this morning).  She felt like she was breathing harder when this occurred.  The discomfort did not radiate anywhere.  She denies any abdominal/back pain or leg swelling.  No calf pain.  She had a similar discomfort a week ago but was not induced by anything.  Home Medications Prior to Admission medications   Medication Sig Start Date End Date Taking? Authorizing Provider  albuterol (VENTOLIN HFA) 108 (90 Base) MCG/ACT inhaler Inhale 2 puffs into the lungs every 4 (four) hours as needed for wheezing or shortness of breath (2 puffs every 4-6 hours as needed for cough/wheeze/shortness of breath/chest tightness.  May use 15-20 minutes prior to activity.). 04/17/23   Marcelyn Bruins, MD  alendronate (FOSAMAX) 70 MG tablet Take 70 mg by mouth every Saturday. 08/09/18   [provider]  aspirin EC 81 MG tablet Take 1 tablet (81 mg total) by mouth daily. Swallow whole. 01/07/22   Patwardhan, Anabel Bene, MD  Calcium Citrate-Vitamin D 315-250 MG-UNIT TABS Take 1 tablet by mouth daily.    [provider]  carboxymethylcellulose (REFRESH PLUS) 0.5 % SOLN Place 1 drop into both eyes in the morning and at bedtime.    [provider]  Cholecalciferol (VITAMIN D3) 10 MCG (400 UNIT) CAPS Take 400 Units by mouth daily.     [provider]  Clobetasol Prop Emollient Base (CLOBETASOL PROPIONATE E) 0.05 % emollient cream Apply 1 application topically 2 (two) times daily as needed. Patient not taking: Reported on 04/17/2023 07/02/21   Janalyn Harder, MD  Coenzyme Q10 (COQ10) 200 MG CAPS Take 200 mg by mouth daily.    [provider]  cycloSPORINE (RESTASIS) 0.05 % ophthalmic emulsion Place 1 drop into both eyes 2 (two) times daily.     [provider]  doxazosin (CARDURA) 1 MG tablet Take 1 tablet (1 mg total) by mouth daily. 04/11/23   Patwardhan, Anabel Bene, MD  fluticasone (FLONASE) 50 MCG/ACT nasal spray Place 1 spray into both nostrils in the morning and at bedtime.    [provider]  fluticasone-salmeterol (WIXELA INHUB) 100-50 MCG/ACT AEPB Inhale 1 puff into the lungs 2 (two) times daily.    [provider]  Magnesium 250 MG TABS Take 250 mg by mouth daily.    [provider]  Multiple Vitamins-Minerals (CENTRUM SILVER) CHEW Chew 1 tablet by mouth daily.    [provider]  nitroGLYCERIN (NITROSTAT) 0.4 MG SL tablet Place 1 tablet (0.4 mg total) under the tongue every 5 (five) minutes as needed for chest pain. 11/14/22 02/20/23  Patwardhan, Anabel Bene, MD  nitroGLYCERIN (NITROSTAT) 0.4 MG SL tablet Place 0.4 mg under the tongue every 5 (five) minutes as needed for chest pain.    [provider]  Olopatadine-Mometasone Cristal Generous) [phone] MCG/ACT SUSP Place 2  sprays into the nose 2 (two) times daily as needed (as needed for runny nose, post-nasal drip or congestion). Patient not taking: Reported on 05/14/2023 04/17/23   Marcelyn Bruins, MD  Olopatadine-Mometasone Cristal Generous) [phone] MCG/ACT SUSP Place 2 sprays into the nose 2 (two) times daily as needed (as needed for runny nose, post-nasal drip or congestion.). Patient not taking: Reported on 05/14/2023 04/25/23   Marcelyn Bruins, MD  Pitavastatin Calcium 2 MG TABS TAKE 1 TABLET BY MOUTH  EVERY DAY 05/22/23   Patwardhan, Anabel Bene, MD  polyethylene glycol (MIRALAX / GLYCOLAX) packet Take 17 g by mouth daily as needed for moderate constipation.    [provider]  pyridOXINE (VITAMIN B-6) 100 MG tablet Take 100 mg by mouth daily.     [provider]  spironolactone (ALDACTONE) 50 MG tablet Take 50 mg by mouth daily.     [provider]      Allergies    Amlodipine besylate, Hydralazine, Losartan potassium, Naproxen, Prednisone, Amlodipine, Isordil [isosorbide nitrate], and Losartan    Review of Systems   Review of Systems  Respiratory:  Positive for shortness of breath.   Cardiovascular:  Positive for chest pain. Negative for leg swelling.  Gastrointestinal:  Negative for abdominal pain.  Musculoskeletal:  Negative for back pain.    Physical Exam Updated Vital Signs BP 122/74   Pulse (!) 51   Temp 98.5 F (36.9 C)   Resp 16   Ht 5' 5.75" (1.67 m)   Wt 57.6 kg   SpO2 99%   BMI 20.65 kg/m  Physical Exam Vitals and nursing note reviewed.  Constitutional:      General: She is not in acute distress.    Appearance: She is well-developed. She is not ill-appearing or diaphoretic.  HENT:     Head: Normocephalic and atraumatic.  Cardiovascular:     Rate and Rhythm: Normal rate and regular rhythm.     Heart sounds: Normal heart sounds.  Pulmonary:     Effort: Pulmonary effort is normal.     Breath sounds: Normal breath sounds.  Abdominal:     Palpations: Abdomen is soft.     Tenderness: There is no abdominal tenderness.  Musculoskeletal:     Right lower leg: No edema.     Left lower leg: No edema.  Skin:    General: Skin is warm and dry.  Neurological:     Mental Status: She is alert.     ED Results / Procedures / Treatments   Labs (all labs ordered are listed, but only abnormal results are displayed) Labs Reviewed  COMPREHENSIVE METABOLIC PANEL - Abnormal; Notable for the following components:      Result Value   Total  Bilirubin 1.4 (*)    All other components within normal limits  CBC WITH DIFFERENTIAL/PLATELET - Abnormal; Notable for the following components:   Hemoglobin 11.9 (*)    All other components within normal limits  TSH  T4, FREE  TROPONIN I (HIGH SENSITIVITY)  TROPONIN I (HIGH SENSITIVITY)    EKG EKG Interpretation Date/Time:  Tuesday July 22 2023 11:25:08 EST Ventricular Rate:  55 PR Interval:  171 QRS Duration:  99 QT Interval:  423 QTC Calculation: 405 R Axis:   -42  Text Interpretation: Sinus bradycardia Left axis deviation no significant change since Dec 2024 Confirmed by Pricilla Loveless [id number]) on 07/22/2023 11:28:19 AM  Radiology DG Chest 2 View Result Date: 07/22/2023 CLINICAL DATA:  Chest pain. EXAM: CHEST - 2 VIEW  COMPARISON:  Chest radiographs 05/20/2023 03/04/2023 FINDINGS: Cardiac silhouette and mediastinal contours are unchanged and within normal limits. Mild increased lucencies are again seen with upper lungs in keeping with the chronic centrilobular emphysematous changes seen on prior CT. Mild curvilinear scarring overlying the bilateral costophrenic angles, unchanged from prior. No acute airspace opacity. No pleural effusion or pneumothorax. Mild multilevel degenerative disc changes of the thoracic spine. IMPRESSION: 1. No active cardiopulmonary disease. 2. Chronic centrilobular emphysematous changes. Electronically Signed   By: Neita Garnet M.D.   On: 07/22/2023 13:48    Procedures Procedures    Medications Ordered in ED Medications  doxazosin (CARDURA) tablet 1 mg (1 mg Oral Given 07/22/23 1443)    ED Course/ Medical Decision Making/ A&P                                 Medical Decision Making Amount and/or Complexity of Data Reviewed Labs: ordered.    Details: First troponin normal Radiology: ordered and independent interpretation performed.    Details: No CHF ECG/medicine tests: ordered and independent interpretation performed.    Details: No  ischemia  Risk Prescription drug management.   Patient presents with chest pain that happened during physical therapy.  Has completely resolved.  She has had no other anginal type symptoms recently.  Unclear if this was truly exertional angina or nonspecific chest pain otherwise.  I offered her admission but she states that if her second troponin is negative she actually just wants to go home and follow-up as an outpatient with cardiology.  She is also requesting thyroid testing which I will order but I do not think we need to wait on the results.  Her vital signs are stable besides some mild bradycardia.  Care transferred to Dr. Jearld Fenton.         Final Clinical Impression(s) / ED Diagnoses Final diagnoses:  None    Rx / DC Orders ED Discharge Orders     None         Pricilla Loveless, MD 07/22/23 [phone]

## 2023-07-22 NOTE — Discharge Instructions (Signed)
Please make an appointment to follow-up with your cardiologist within 1 to 2 weeks.  If your symptoms worsen please do not hesitate to return to the emergency department.

## 2023-07-22 NOTE — ED Notes (Signed)
Patient transported to X-ray

## 2023-07-22 NOTE — ED Provider Notes (Signed)
Assumed care of patient from off-going team. For more details, please see note from same day.  In brief, this is a 83 y.o. female with chest pain. Off-going EDP discussed with patient about admission vs discharge but patient preferred to be discharged and f/u outpatient  Plan/Dispo at time of sign-out & ED Course since sign-out: [ ]  repeat trop [ ]  thyroid studies  BP 130/79   Pulse (!) 57   Temp 98.7 F (37.1 C) (Oral)   Resp 13   Ht 5' 5.75" (1.67 m)   Wt 57.6 kg   SpO2 98%   BMI 20.65 kg/m    ED Course:   Clinical Course as of 07/22/23 1740  Tue Jul 22, 2023  1740 T4,Free(Direct): 0.83 wnl [HN]  1740 TSH: 1.747 wnl [HN]  1740 Troponin I (High Sensitivity): 4 Neg trop x2 [HN]    Clinical Course User Index [HN] Loetta Rough, MD    Dispo: D/w patient who is reassured w/ her negative w/u.  She has no active chest pain and is very well-appearing.  Patient was offered admission for continued troponin trend and evaluation by cardiology versus discharge for outpatient cardiology follow-up and patient prefers discharge.  Patient is instructed to is instructed to follow-up with her cardiologist within 1 week.  Given discharge instructions and return precautions, all questions answered to patient satisfaction. ------------------------------- Vivi Barrack, MD Emergency Medicine  This note was created using dictation software, which may contain spelling or grammatical errors.   Loetta Rough, MD 07/22/23 2014

## 2023-07-22 NOTE — ED Notes (Signed)
Called CCMD and placed PT on bedside.

## 2023-07-23 ENCOUNTER — Telehealth: Payer: Self-pay | Admitting: Cardiology

## 2023-07-23 NOTE — Telephone Encounter (Signed)
[place], [place], RN  Loa Socks, LPN Caller: Unspecified (Today, 10:59 AM) Pt called stating she has had CP for a couple of weeks. Went to ED yesterday but was discharged because troponins negative and CP had completely resolved. Pt opted to f/u outpt. Below is the note:  Patient presents with chest pain that happened during physical therapy.  Has completely resolved.  She has had no other anginal type symptoms recently.  Unclear if this was truly exertional angina or nonspecific chest pain otherwise.  I offered her admission but she states that if her second troponin is negative she actually just wants to go home and follow-up as an outpatient with cardiology.  She is also requesting thyroid testing which I will order but I do not think we need to wait on the results.  Her vital signs are stable besides some mild bradycardia.  Care transferred to Dr. Jearld Fenton.

## 2023-07-23 NOTE — Telephone Encounter (Signed)
   Pt c/o of Chest Pain: STAT if active CP, including tightness, pressure, jaw pain, radiating pain to shoulder/upper arm/back, CP unrelieved by Nitro. Symptoms reported of SOB, nausea, vomiting, sweating.  1. Are you having CP right now? no    2. Are you experiencing any other symptoms (ex. SOB, nausea, vomiting, sweating)? Sob    3. Is your CP continuous or coming and going? Coming & going    4. Have you taken Nitroglycerin? No    5. How long have you been experiencing CP? Since Yesterday. Pt went to ER.   6. If NO CP at time of call then end call with telling Pt to call back or call 911 if Chest pain returns prior to return call from triage team.

## 2023-07-23 NOTE — Telephone Encounter (Signed)
Returned a call back to the Sara Brown and was able to schedule her to see Dr. Rosemary Holms on 2/20 at 2:20 pm.  Sara Brown aware to arrive 15 mins prior to this visit.  ED precautions provided to the Sara Brown, if symptoms were to return/worsen in the meantime between now and her appt with Dr. Rosemary Holms on 2/20 at 2:20 pm.    Sara Brown verbalized understanding and agrees with this plan.  Dr. Rosemary Holms verbally made aware of this plan.

## 2023-07-24 ENCOUNTER — Telehealth: Payer: Self-pay | Admitting: Pharmacist

## 2023-07-24 NOTE — Telephone Encounter (Signed)
Patient Sara Brown, she has question regarding her BP medication.   Call back, N/A, Sara Brown

## 2023-07-25 NOTE — Telephone Encounter (Signed)
Spoke to patient, reports when she went to ED they gave her doxazosin later than her due time since then she is off the schedule for her BP medication. Clarify all concern and she will resume her schedule of BP meds by taking spironolactone 50 mg 7 am and doxazosin 1 mg  at 1 pm.

## 2023-07-27 ENCOUNTER — Ambulatory Visit (HOSPITAL_COMMUNITY)
Admission: EM | Admit: 2023-07-27 | Discharge: 2023-07-27 | Disposition: A | Discharge status: Home / Self Care | Payer: Medicare Other | Attending: Emergency Medicine | Admitting: Emergency Medicine

## 2023-07-27 ENCOUNTER — Encounter (HOSPITAL_COMMUNITY): Payer: Self-pay

## 2023-07-27 DIAGNOSIS — M25562 Pain in left knee: Secondary | ICD-10-CM

## 2023-07-27 DIAGNOSIS — R1032 Left lower quadrant pain: Secondary | ICD-10-CM | POA: Diagnosis not present

## 2023-07-27 DIAGNOSIS — G8929 Other chronic pain: Secondary | ICD-10-CM | POA: Diagnosis not present

## 2023-07-27 DIAGNOSIS — M545 Low back pain, unspecified: Secondary | ICD-10-CM

## 2023-07-27 MED ORDER — DICLOFENAC SODIUM 1 % EX GEL: 4.0000 g | Freq: Four times a day (QID) | CUTANEOUS | 0 refills | Status: DC

## 2023-07-27 MED ORDER — KETOROLAC TROMETHAMINE 30 MG/ML IJ SOLN: 15.0000 mg | Freq: Once | INTRAMUSCULAR | Status: DC

## 2023-07-27 NOTE — ED Triage Notes (Signed)
 Patient here today with c/o LB pain, left thigh, and left knee pain X 6 weeks. She saw her PCP and went to [facility]. Patient states that she had received some injections with no relief.

## 2023-07-27 NOTE — ED Provider Notes (Signed)
 [facility]    CSN: [id number] Arrival date & time: 07/27/23  1245      History   Chief Complaint Chief Complaint  Patient presents with   Back Pain    HPI Sara Brown is a 83 y.o. female.   Patient presents with chronic bilateral low back pain, left groin pain, and left knee pain.  Patient states that she has seen her PCP and gone to [place] [facility] where she received steroid injections without relief.  Patient reports occasionally taking Tylenol with some relief.  Denies any known injury.  Denies numbness, tingling, and weakness.  History of arthritis.   Back Pain   Past Medical History:  Diagnosis Date   Age-related osteoporosis without current pathological fracture 08/18/2020   Allergic rhinitis    Angina at rest [facility]) 02/09/2018   Arthritis    "fingers, knees" (02/09/2018)   Asthma    "stopped taking RX after dr said I don't have this" (02/09/2018)   Basal cell carcinoma    Bradycardia 02/09/2018   Carotid stenosis    ICA(L)   Chest pain due to coronary artery disease 02/09/2018   Chronic bronchitis (HCC)    Chronic constipation 08/18/2020   Complication of anesthesia    "felt the cut w/one of my c-sections" (02/09/2018)   Coronary artery disease involving native coronary artery of native heart without angina pectoris 09/17/2018   Diabetes mellitus type 2, controlled 03/31/2018   Eczema    EEG abnormal    Essential hypertension 03/31/2018   Generalized anxiety disorder    GERD (gastroesophageal reflux disease)    Hardening of the aorta (main artery of the heart)    Headache    "years ago; before menopause" (02/09/2018)   Hirsutism 06/12/2017   Hyperglycemia due to type 2 diabetes mellitus 03/09/2021   Hyperlipidemia    Hyperpigmentation 02/13/2017   Hypertensive retinopathy    Irritable bowel syndrome    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   Mixed conductive and sensorineural hearing loss of left ear with restricted  hearing of right ear 01/24/2021   Mixed hyperlipidemia 12/29/2020   Myringotomy tube status 04/03/2021   Neck mass 01/17/2017   Neuropathic pain 03/09/2021   OA (osteoarthritis)    Osteoporosis    Palpitations 03/16/2021   Paresthesias 09/05/2020   Personal history of colonic polyps    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   Pure hypercholesterolemia 08/18/2020   Shortness of breath 07/30/2007   Skin laxity 06/12/2017   TIA (transient ischemic attack) ?2005   Urticaria    Vitamin D deficiency     Patient Active Problem List   Diagnosis Date Noted   Precordial pain 01/23/2022   Numbness 01/23/2022   Decreased estrogen level 12/24/2021   Sacroiliitis, not elsewhere classified (HCC) 12/24/2021   Vitamin D deficiency 08/31/2021   Generalized anxiety disorder    EEG abnormal    Hypertensive retinopathy    History of colonic polyps    Irritable bowel syndrome    Hardening of the aorta (main artery of the heart)    Myringotomy tube status 04/03/2021   Palpitations 03/16/2021   Hyperglycemia due to type 2 diabetes mellitus 03/09/2021   Neuropathic pain 03/09/2021   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   Mixed hyperlipidemia 12/29/2020   TIA (transient ischemic attack) 11/13/2020   Paresthesias 09/05/2020   Age-related osteoporosis without current pathological  fracture 08/18/2020   Pure hypercholesterolemia 08/18/2020   Chronic constipation 08/18/2020   Coronary artery disease involving native coronary artery of native heart without angina pectoris 09/17/2018   Diabetes mellitus type 2, controlled 03/31/2018   Essential hypertension 03/31/2018   Chest pain due to coronary artery disease (HCC) 02/09/2018   Angina at rest 02/09/2018   Bradycardia 02/09/2018   Skin laxity 06/12/2017   Hirsutism 06/12/2017   Hyperpigmentation 02/13/2017   Neck mass 01/17/2017    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   Asymmetrical hearing loss 02/21/2016   GERD (gastroesophageal reflux disease)    Shortness of breath 07/30/2007    Past Surgical History:  Procedure Laterality Date   CATARACT EXTRACTION W/ INTRAOCULAR LENS  IMPLANT, BILATERAL Bilateral    CESAREAN SECTION  1971; 1975   CORONARY PRESSURE/FFR STUDY N/A 04/01/2018   Procedure: INTRAVASCULAR PRESSURE WIRE/FFR STUDY;  Surgeon: Elder Negus, MD;  [facility];  Service: Cardiovascular;  Laterality: N/A;   LEFT HEART CATH AND CORONARY ANGIOGRAPHY N/A 04/01/2018   Procedure: LEFT HEART CATH AND CORONARY ANGIOGRAPHY;  Surgeon: Elder Negus, MD;  [facility]: [facility];  Service: Cardiovascular;  Laterality: N/A;   TUBAL LIGATION  1975   TYMPANOSTOMY TUBE PLACEMENT Left 03/01/2021    OB History   No obstetric history on file.      Home Medications    Prior to Admission medications   Medication Sig Start Date End Date Taking? Authorizing Provider  diclofenac Sodium (VOLTAREN) 1 % GEL Apply 4 g topically 4 (four) times daily. 07/27/23  Yes Susann Givens, Felicitas Sine A, NP  albuterol (VENTOLIN HFA) 108 (90 Base) MCG/ACT inhaler Inhale 2 puffs into the lungs every 4 (four) hours as needed for wheezing or shortness of breath (2 puffs every 4-6 hours as needed for cough/wheeze/shortness of breath/chest tightness.  May use 15-20 minutes prior to activity.). 04/17/23   Marcelyn Bruins, MD  alendronate (FOSAMAX) 70 MG tablet Take 70 mg by mouth every Saturday. 08/09/18   [provider]  aspirin EC 81 MG tablet Take 1 tablet (81 mg total) by mouth daily. Swallow whole. 01/07/22   Patwardhan, Anabel Bene, MD  Calcium Citrate-Vitamin D 315-250 MG-UNIT TABS Take 1 tablet by mouth daily.    [provider]  carboxymethylcellulose (REFRESH PLUS) 0.5 % SOLN Place 1 drop into both eyes in the morning and at bedtime.    [provider]  Cholecalciferol (VITAMIN D3)  10 MCG (400 UNIT) CAPS Take 400 Units by mouth daily.    [provider]  Clobetasol Prop Emollient Base (CLOBETASOL PROPIONATE E) 0.05 % emollient cream Apply 1 application topically 2 (two) times daily as needed. Patient not taking: Reported on 04/17/2023 07/02/21   Janalyn Harder, MD  Coenzyme Q10 (COQ10) 200 MG CAPS Take 200 mg by mouth daily.    [provider]  cycloSPORINE (RESTASIS) 0.05 % ophthalmic emulsion Place 1 drop into both eyes 2 (two) times daily.     [provider]  doxazosin (CARDURA) 1 MG tablet Take 1 tablet (1 mg total) by mouth daily. 04/11/23   Patwardhan, Anabel Bene, MD  fluticasone (FLONASE) 50 MCG/ACT nasal spray Place 1 spray into both nostrils in the morning and at bedtime.    [provider]  fluticasone-salmeterol (WIXELA INHUB) 100-50 MCG/ACT AEPB Inhale 1 puff into the lungs 2 (two) times daily.    [provider]  Magnesium 250 MG TABS Take 250 mg by mouth daily.  [provider]  Multiple Vitamins-Minerals (CENTRUM SILVER) CHEW Chew 1 tablet by mouth daily.    [provider]  nitroGLYCERIN (NITROSTAT) 0.4 MG SL tablet Place 1 tablet (0.4 mg total) under the tongue every 5 (five) minutes as needed for chest pain. 11/14/22 02/20/23  Patwardhan, Anabel Bene, MD  nitroGLYCERIN (NITROSTAT) 0.4 MG SL tablet Place 0.4 mg under the tongue every 5 (five) minutes as needed for chest pain.    [provider]  Olopatadine-Mometasone Cristal Generous) [phone] MCG/ACT SUSP Place 2 sprays into the nose 2 (two) times daily as needed (as needed for runny nose, post-nasal drip or congestion). Patient not taking: Reported on 05/14/2023 04/17/23   Marcelyn Bruins, MD  Olopatadine-Mometasone Cristal Generous) [phone] MCG/ACT SUSP Place 2 sprays into the nose 2 (two) times daily as needed (as needed for runny nose, post-nasal drip or congestion.). Patient not taking: Reported on 05/14/2023 04/25/23   Marcelyn Bruins, MD   Pitavastatin Calcium 2 MG TABS TAKE 1 TABLET BY MOUTH EVERY DAY 05/22/23   Patwardhan, Anabel Bene, MD  polyethylene glycol (MIRALAX / GLYCOLAX) packet Take 17 g by mouth daily as needed for moderate constipation.    [provider]  pyridOXINE (VITAMIN B-6) 100 MG tablet Take 100 mg by mouth daily.     [provider]  spironolactone (ALDACTONE) 50 MG tablet Take 50 mg by mouth daily.     [provider]    Family History Family History  Problem Relation Age of Onset   Diabetes Mellitus II Mother    Osteoporosis Mother    Hypertension Mother    Diabetes Mellitus II Father    CVA Sister    CAD Sister    Hypertension Sister    Osteoarthritis Sister    Alcoholism Brother    Epilepsy Brother    Breast cancer Cousin 75   Dementia Cousin        suspected; never confirmed   Allergic rhinitis Neg Hx    Angioedema Neg Hx    Asthma Neg Hx    Eczema Neg Hx    Urticaria Neg Hx     Social History Social History   Tobacco Use   Smoking status: Never   Smokeless tobacco: Never  Vaping Use   Vaping status: Never Used  Substance Use Topics   Alcohol use: Never   Drug use: Never     Allergies   Amlodipine besylate, Hydralazine, Losartan potassium, Naproxen, Prednisone, Amlodipine, Isordil [isosorbide nitrate], and Losartan   Review of Systems Review of Systems  Musculoskeletal:  Positive for back pain.   Per HPI  Physical Exam Triage Vital Signs ED Triage Vitals  Encounter Vitals Group     BP 07/27/23 1422 (!) 167/91     Systolic BP Percentile --      Diastolic BP Percentile --      Pulse Rate 07/27/23 1422 67     Resp 07/27/23 1422 16     Temp 07/27/23 1422 99 F (37.2 C)     Temp Source 07/27/23 1422 Oral     SpO2 07/27/23 1422 94 %     Weight 07/27/23 1426 116 lb (52.6 kg)     Height 07/27/23 1426 5\' 5"  (1.651 m)     Head Circumference --      Peak Flow --      Pain Score 07/27/23 1426 8     Pain Loc --      Pain Education --  Exclude from Growth Chart --    No data found.  Updated Vital Signs BP (!) 167/91 (BP Location: Right Arm)   Pulse 67   Temp 99 F (37.2 C) (Oral)   Resp 16   Ht 5\' 5"  (1.651 m)   Wt 116 lb (52.6 kg)   SpO2 94%   BMI 19.30 kg/m   Visual Acuity Right Eye Distance:   Left Eye Distance:   Bilateral Distance:    Right Eye Near:   Left Eye Near:    Bilateral Near:     Physical Exam Vitals and nursing note reviewed.  Constitutional:      General: She is awake. She is not in acute distress.    Appearance: Normal appearance. She is well-developed and well-groomed. She is not ill-appearing.  Musculoskeletal:     Cervical back: Normal.     Thoracic back: Normal.     Lumbar back: Tenderness present. No bony tenderness. Normal range of motion.     Left upper leg: Tenderness present. No swelling or bony tenderness.     Left knee: Swelling present. No erythema. Normal range of motion. Tenderness present. No medial joint line tenderness.  Neurological:     Mental Status: She is alert.  Psychiatric:        Behavior: Behavior is cooperative.      UC Treatments / Results  Labs (all labs ordered are listed, but only abnormal results are displayed) Labs Reviewed - No data to display  EKG   Radiology No results found.  Procedures Procedures (including critical care time)  Medications Ordered in UC Medications  ketorolac (TORADOL) 30 MG/ML injection 15 mg (has no administration in time range)    Initial Impression / Assessment and Plan / UC Course  I have reviewed the triage vital signs and the nursing notes.  Pertinent labs & imaging results that were available during my care of the patient were reviewed by me and considered in my medical decision making (see chart for details).     Patient presented with chronic bilateral low back pain, left groin pain, and left knee pain.  Patient has been seen prior for same and given steroid injections without relief.  History of  arthritis.  Upon assessment mild tenderness noted to bilateral low back, left upper leg, and mild swelling and tenderness noted to left knee.  Patient able to ambulate with slight limp.  Without decreased range of motion.  Given low dose of Toradol injection in clinic.  Prescribed Voltaren gel to apply as needed for arthritic related pain.  Discussed follow-up and return precautions. Final Clinical Impressions(s) / UC Diagnoses   Final diagnoses:  Chronic bilateral low back pain without sciatica  Groin pain, left  Chronic pain of left knee     Discharge Instructions      You were given a Toradol injection in clinic today to help with pain relief. I have prescribed Voltaren gel that you can apply to the areas you are experiencing pain as needed. You can also take Tylenol as needed for pain. Otherwise you can apply heat and ice as needed for pain. Keep your appointment with your primary care provider tomorrow to discuss further management of your chronic pain.    ED Prescriptions     Medication Sig Dispense Auth. Provider   diclofenac Sodium (VOLTAREN) 1 % GEL Apply 4 g topically 4 (four) times daily. 50 g Wynonia Lawman A, NP      PDMP not reviewed this encounter.  Wynonia Lawman A, NP 07/27/23 1544

## 2023-07-27 NOTE — Discharge Instructions (Addendum)
 You were given a Toradol injection in clinic today to help with pain relief. I have prescribed Voltaren gel that you can apply to the areas you are experiencing pain as needed. You can also take Tylenol as needed for pain. Otherwise you can apply heat and ice as needed for pain. Keep your appointment with your primary care provider tomorrow to discuss further management of your chronic pain.

## 2023-07-28 DIAGNOSIS — R899 Unspecified abnormal finding in specimens from other organs, systems and tissues: Secondary | ICD-10-CM | POA: Diagnosis not present

## 2023-07-28 DIAGNOSIS — Z111 Encounter for screening for respiratory tuberculosis: Secondary | ICD-10-CM | POA: Diagnosis not present

## 2023-07-28 DIAGNOSIS — F418 Other specified anxiety disorders: Secondary | ICD-10-CM | POA: Diagnosis not present

## 2023-07-29 ENCOUNTER — Ambulatory Visit: Payer: Medicare Other | Admitting: Nurse Practitioner

## 2023-07-29 DIAGNOSIS — M4316 Spondylolisthesis, lumbar region: Secondary | ICD-10-CM | POA: Diagnosis not present

## 2023-07-29 DIAGNOSIS — M79652 Pain in left thigh: Secondary | ICD-10-CM | POA: Diagnosis not present

## 2023-07-30 ENCOUNTER — Ambulatory Visit: Payer: Medicare Other | Admitting: Podiatry

## 2023-07-30 ENCOUNTER — Other Ambulatory Visit: Payer: Self-pay | Admitting: Cardiology

## 2023-07-30 ENCOUNTER — Ambulatory Visit: Payer: Medicare Other | Admitting: Cardiology

## 2023-07-30 ENCOUNTER — Encounter: Payer: Self-pay | Admitting: Podiatry

## 2023-07-30 ENCOUNTER — Ambulatory Visit (INDEPENDENT_AMBULATORY_CARE_PROVIDER_SITE_OTHER): Payer: Medicare Other | Admitting: Podiatry

## 2023-07-30 DIAGNOSIS — M79674 Pain in right toe(s): Secondary | ICD-10-CM | POA: Diagnosis not present

## 2023-07-30 DIAGNOSIS — L84 Corns and callosities: Secondary | ICD-10-CM

## 2023-07-30 DIAGNOSIS — E1142 Type 2 diabetes mellitus with diabetic polyneuropathy: Secondary | ICD-10-CM | POA: Diagnosis not present

## 2023-07-30 DIAGNOSIS — B351 Tinea unguium: Secondary | ICD-10-CM

## 2023-07-30 DIAGNOSIS — M79675 Pain in left toe(s): Secondary | ICD-10-CM

## 2023-07-30 NOTE — Progress Notes (Addendum)
 Subjective:  Patient ID: Sara Brown, female    DOB: 04-Dec-1940,   MRN: [id number]  No chief complaint on file.   83 y.o. female presents for concern of thickened elongated and painful nails that are difficult to trim. Requesting to have them trimmed today. Relates burning and tingling in their feet. Patient is diabetic and last A1c was  Lab Results  Component Value Date   HGBA1C 5.9 (H) 11/14/2020   .   PCP:  Renford Dills, MD    . Denies any other pedal complaints. Denies n/v/f/c.   Past Medical History:  Diagnosis Date   Age-related osteoporosis without current pathological fracture 08/18/2020   Allergic rhinitis    Angina at rest [facility]) 02/09/2018   Arthritis    "fingers, knees" (02/09/2018)   Asthma    "stopped taking RX after dr said I don't have this" (02/09/2018)   Basal cell carcinoma    Bradycardia 02/09/2018   Carotid stenosis    ICA(L)   Chest pain due to coronary artery disease 02/09/2018   Chronic bronchitis (HCC)    Chronic constipation 08/18/2020   Complication of anesthesia    "felt the cut w/one of my c-sections" (02/09/2018)   Coronary artery disease involving native coronary artery of native heart without angina pectoris 09/17/2018   Diabetes mellitus type 2, controlled 03/31/2018   Eczema    EEG abnormal    Essential hypertension 03/31/2018   Generalized anxiety disorder    GERD (gastroesophageal reflux disease)    Hardening of the aorta (main artery of the heart)    Headache    "years ago; before menopause" (02/09/2018)   Hirsutism 06/12/2017   Hyperglycemia due to type 2 diabetes mellitus 03/09/2021   Hyperlipidemia    Hyperpigmentation 02/13/2017   Hypertensive retinopathy    Irritable bowel syndrome    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   Mixed hyperlipidemia 12/29/2020   Myringotomy tube status 04/03/2021   Neck mass  01/17/2017   Neuropathic pain 03/09/2021   OA (osteoarthritis)    Osteoporosis    Palpitations 03/16/2021   Paresthesias 09/05/2020   Personal history of colonic polyps    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 CFT <3 seconds. Absent hair growth on digits. Edema noted to bilateral lower extremities. Xerosis noted bilaterally.  Skin. No lacerations or abrasions bilateral feet. Nails 1-5 bilateral  are thickened discolored and elongated with subungual debris. Hyperkeratotic tissue noted to sub metatarsal first head on right and sub third metatarsal head on left.   Musculoskeletal: MMT 5/5 bilateral lower extremities in DF, PF, Inversion and Eversion. Deceased ROM in DF of ankle joint.  Neurological: Sensation intact to light touch. Protective sensation diminished bilateral.    Assessment:   1. Pain due to onychomycosis of toenails of both feet   2. Diabetic peripheral neuropathy associated with type 2 diabetes mellitus (HCC)   3. Callus      Plan:  Patient was evaluated and treated and all questions answered. -Discussed and educated patient on diabetic foot care, especially with  regards to the vascular, neurological and musculoskeletal systems.  -Stressed the importance of good glycemic control  and the detriment of not  controlling glucose levels in relation to the foot. -Discussed supportive shoes at all times and checking feet regularly.  -Hyperkeratotic tissue right foot sub first metatarsal head and plantar third metatarsal on left  debrided without incident with chisel.  -Mechanically debrided all nails 1-5 bilateral using sterile nail nipper without incident  -Answered all patient questions -Discussed topicals for neuropathy pains.   -Patient to return  in 3 months   for at risk foot care -Patient advised to call the office if any problems or questions arise in the meantime.   Louann Sjogren, DPM

## 2023-07-31 ENCOUNTER — Ambulatory Visit: Payer: Medicare Other | Attending: Cardiology | Admitting: Cardiology

## 2023-07-31 NOTE — Progress Notes (Unsigned)
  Cardiology Office Note:  .   Date:  07/31/2023  ID:  Sara Brown, DOB Feb 18, 1941, MRN [id number] PCP: Renford Dills, MD  [facility] Providers Cardiologist:  Truett Mainland, MD PCP: Renford Dills, MD  No chief complaint on file.     History of Present Illness: .    Sara Brown is a 83 y.o. female with hypertension, diet controlled diabetes, hyperlipidemia,  GERD and remote history of TIA, mild nonobstructive coronary artery disease   Patient has been intolerant to several medications-including but not limited to losartan, amlodipine, hydralazine, atenolol, nifedipine, clonidine, minoxidil, reluctant to use carvedilol and labetalol due to history of asthma.   Patient was recently seen in [facility] ER with chest pain. ACS was excluded.  ***  There were no vitals filed for this visit.   ROS: *** ROS   Studies Reviewed: .        *** Independently interpreted 07/22/2023: Hb 11.9 Cr 0.82 Trop HS 4, 4 TSH 1.7, free T4 0.8  2022; Chol 180, TG 85, HDL 62, LDL 101 HbA1C 5.9%  Coronary angiogram 10/209: LM: Normal LAD: Prox-mid 40% stenosis. iFR 0.99 (Physiologically nonsignificant) LCx: Dominant. Small branches with mild disease RCA: Nondominant. Normal   LVEDP mildly elevated.  Risk Assessment/Calculations:   {Does this patient have ATRIAL FIBRILLATION?:[phone]}     Physical Exam:   Physical Exam   VISIT DIAGNOSES: No diagnosis found.   ASSESSMENT AND PLAN: .    Sara Brown is a 83 y.o. female with hypertension, diet controlled diabetes, hyperlipidemia,  GERD and remote history of TIA, mild nonobstructive coronary artery disease (Cath 04/01/2018).   *** Hypertension: Patient has been intolerant to several medications-including but not limited to losartan, amlodipine, hydralazine, atenolol, nifedipine, clonidine, minoxidil, reluctant to use carvedilol and labetalol due to history of asthma.  ***   Continue spironolactone 50 mg daily.  Stop minoxidil due to patient's concerns. Given mildly abnormal stress test, I offered to add isosorbide mononitrate 30 mg daily for management of chest pain, as well as blood pressure.  She is directed to take this due to headache that she had when she was young.   Unfortunately, I have exhausted options for antihypertensive therapy for Sara Brown.  I have referred her to hypertension clinic.   Coronary artery disease: Nonobstructive on cath in 2019. Recent chest pain is rather atypical.  Stress test showed mild inferior ischemia.  There is able to manage this medically for now.  If worsening exertional chest pain, could consider heart catheterization in future.     Continue risk factor modification with BP and lipid lowering therapy. Continue aspirin 81 mg daily (does not need to take plavix), pitvastatin. She has not tolerated other statins in the past. She does not want to try injectables.    {Are you ordering a CV Procedure (e.g. stress test, cath, DCCV, TEE, etc)?   Press F2        :[id number]}    No orders of the defined types were placed in this encounter.    F/u in ***  Signed, Elder Negus, MD

## 2023-08-04 DIAGNOSIS — M25552 Pain in left hip: Secondary | ICD-10-CM | POA: Diagnosis not present

## 2023-08-06 DIAGNOSIS — Z9622 Myringotomy tube(s) status: Secondary | ICD-10-CM | POA: Diagnosis not present

## 2023-08-06 DIAGNOSIS — H6123 Impacted cerumen, bilateral: Secondary | ICD-10-CM | POA: Diagnosis not present

## 2023-08-11 ENCOUNTER — Other Ambulatory Visit: Payer: Self-pay

## 2023-08-11 ENCOUNTER — Emergency Department (HOSPITAL_BASED_OUTPATIENT_CLINIC_OR_DEPARTMENT_OTHER)
Admission: EM | Admit: 2023-08-11 | Discharge: 2023-08-11 | Disposition: A | Discharge status: Home / Self Care | Attending: Emergency Medicine | Admitting: Emergency Medicine

## 2023-08-11 ENCOUNTER — Emergency Department (HOSPITAL_BASED_OUTPATIENT_CLINIC_OR_DEPARTMENT_OTHER): Admitting: Radiology

## 2023-08-11 ENCOUNTER — Encounter (HOSPITAL_BASED_OUTPATIENT_CLINIC_OR_DEPARTMENT_OTHER): Payer: Self-pay | Admitting: Emergency Medicine

## 2023-08-11 DIAGNOSIS — Z7982 Long term (current) use of aspirin: Secondary | ICD-10-CM | POA: Diagnosis not present

## 2023-08-11 DIAGNOSIS — I10 Essential (primary) hypertension: Secondary | ICD-10-CM | POA: Insufficient documentation

## 2023-08-11 DIAGNOSIS — R0789 Other chest pain: Secondary | ICD-10-CM | POA: Diagnosis not present

## 2023-08-11 DIAGNOSIS — R079 Chest pain, unspecified: Secondary | ICD-10-CM | POA: Diagnosis not present

## 2023-08-11 DIAGNOSIS — E119 Type 2 diabetes mellitus without complications: Secondary | ICD-10-CM | POA: Diagnosis not present

## 2023-08-11 DIAGNOSIS — J45909 Unspecified asthma, uncomplicated: Secondary | ICD-10-CM | POA: Diagnosis not present

## 2023-08-11 DIAGNOSIS — I2510 Atherosclerotic heart disease of native coronary artery without angina pectoris: Secondary | ICD-10-CM | POA: Insufficient documentation

## 2023-08-11 LAB — BASIC METABOLIC PANEL
Anion gap: 9 (ref 5–15)
BUN: 17 mg/dL (ref 8–23)
CO2: 28 mmol/L (ref 22–32)
Calcium: 9.8 mg/dL (ref 8.9–10.3)
Chloride: 101 mmol/L (ref 98–111)
Creatinine, Ser: 0.78 mg/dL (ref 0.44–1.00)
GFR, Estimated: >60 mL/min (ref >60)
Glucose, Bld: 127 mg/dL — ABNORMAL HIGH (ref 70–99)
Potassium: 4 mmol/L (ref 3.5–5.1)
Sodium: 138 mmol/L (ref 135–145)

## 2023-08-11 LAB — CBC
HCT: 38.5 % (ref 36.0–46.0)
Hemoglobin: 13 g/dL (ref 12.0–15.0)
MCH: 30.4 pg (ref 26.0–34.0)
MCHC: 33.8 g/dL (ref 30.0–36.0)
MCV: 90 fL (ref 80.0–100.0)
Platelets: 277 10*3/uL (ref 150–400)
RBC: 4.28 MIL/uL (ref 3.87–5.11)
RDW: 14.2 % (ref 11.5–15.5)
WBC: 6.5 10*3/uL (ref 4.0–10.5)
nRBC: 0 % (ref 0.0–0.2)

## 2023-08-11 LAB — TROPONIN I (HIGH SENSITIVITY)
Troponin I (High Sensitivity): 2 ng/L (ref <18)
Troponin I (High Sensitivity): 2 ng/L (ref <18)

## 2023-08-11 NOTE — Discharge Instructions (Signed)
 You have been seen today for your complaint of chest pain. Your lab work was reassuring. Your imaging was reassuring. Follow up with: Your cardiologist as scheduled Please seek immediate medical care if you develop any of the following symptoms: Your chest pain gets worse. You have a cough that gets worse, or you cough up blood. You have severe pain in your abdomen. You faint. You have sudden, unexplained chest discomfort. You have sudden, unexplained discomfort in your arms, back, neck, or jaw. You have shortness of breath at any time. You suddenly start to sweat, or your skin gets clammy. You feel nausea or you vomit. You suddenly feel lightheaded or dizzy. You have severe weakness, or unexplained weakness or fatigue. Your heart begins to beat quickly, or it feels like it is skipping beats. At this time there does not appear to be the presence of an emergent medical condition, however there is always the potential for conditions to change. Please read and follow the below instructions.  Do not take your medicine if  develop an itchy rash, swelling in your mouth or lips, or difficulty breathing; call 911 and seek immediate emergency medical attention if this occurs.  You may review your lab tests and imaging results in their entirety on your MyChart account.  Please discuss all results of fully with your primary care provider and other specialist at your follow-up visit.  Note: Portions of this text may have been transcribed using voice recognition software. Every effort was made to ensure accuracy; however, inadvertent computerized transcription errors may still be present.

## 2023-08-11 NOTE — ED Notes (Signed)
Discharge paperwork given an verbally understood.

## 2023-08-11 NOTE — ED Triage Notes (Signed)
 Left side chest pain started around 8am Took nitroglycerin x 3 after 8:30AM Denies n.v some sob and weakness Some improvement after nitroglycerin

## 2023-08-11 NOTE — ED Provider Notes (Signed)
 [place] EMERGENCY DEPARTMENT AT [facility] Provider Note   CSN: [id number] Arrival date & time: 08/11/23  1134     History  Chief Complaint  Patient presents with   Chest Pain    Sara Brown is a 83 y.o. female.  With an extensive past medical history including but not limited to hyperlipidemia, CAD, NIDDM 2, hypercholesterolemia, anxiety, depression, hypertension, asthma, GERD presenting to the ED for evaluation of chest pain.  She was standing in her kitchen at approximately 8:00 this morning when she developed sudden onset left-sided chest pain that did not radiate.  She went to her bedroom and took 3 doses of nitroglycerin, 5 minutes apart and states that after the third dose her symptoms resolved.  Symptoms returned for a little while as a discomfort but then resolved again.  No shortness of breath.  No cough.  No nausea or vomiting.  No diaphoresis.  She states a similar episode happened approximately 1 month ago.  She reports to me that she tried to get an appointment with her cardiologist but was unable to get an appointment with him, but was scheduled for an appointment with the PA with her cardiology group and has that appointment later this week.  She is currently asymptomatic but states that she wants to be checked out.   Chest Pain      Home Medications Prior to Admission medications   Medication Sig Start Date End Date Taking? Authorizing Provider  albuterol (VENTOLIN HFA) 108 (90 Base) MCG/ACT inhaler Inhale 2 puffs into the lungs every 4 (four) hours as needed for wheezing or shortness of breath (2 puffs every 4-6 hours as needed for cough/wheeze/shortness of breath/chest tightness.  May use 15-20 minutes prior to activity.). 04/17/23   Marcelyn Bruins, MD  alendronate (FOSAMAX) 70 MG tablet Take 70 mg by mouth every Saturday. 08/09/18   [provider]  aspirin EC 81 MG tablet Take 1 tablet (81 mg total) by mouth daily. Swallow  whole. 01/07/22   Patwardhan, Anabel Bene, MD  Calcium Citrate-Vitamin D 315-250 MG-UNIT TABS Take 1 tablet by mouth daily.    [provider]  carboxymethylcellulose (REFRESH PLUS) 0.5 % SOLN Place 1 drop into both eyes in the morning and at bedtime.    [provider]  Cholecalciferol (VITAMIN D3) 10 MCG (400 UNIT) CAPS Take 400 Units by mouth daily.    [provider]  Clobetasol Prop Emollient Base (CLOBETASOL PROPIONATE E) 0.05 % emollient cream Apply 1 application topically 2 (two) times daily as needed. Patient not taking: Reported on 04/17/2023 07/02/21   Janalyn Harder, MD  Coenzyme Q10 (COQ10) 200 MG CAPS Take 200 mg by mouth daily.    [provider]  cycloSPORINE (RESTASIS) 0.05 % ophthalmic emulsion Place 1 drop into both eyes 2 (two) times daily.     [provider]  diclofenac Sodium (VOLTAREN) 1 % GEL Apply 4 g topically 4 (four) times daily. 07/27/23   Wynonia Lawman A, NP  doxazosin (CARDURA) 1 MG tablet TAKE 1 TABLET BY MOUTH EVERY DAY 07/31/23   Patwardhan, Manish J, MD  fluticasone (FLONASE) 50 MCG/ACT nasal spray Place 1 spray into both nostrils in the morning and at bedtime.    [provider]  fluticasone-salmeterol (WIXELA INHUB) 100-50 MCG/ACT AEPB Inhale 1 puff into the lungs 2 (two) times daily.    [provider]  Magnesium 250 MG TABS Take 250 mg by mouth daily.    [provider]  Multiple Vitamins-Minerals (CENTRUM SILVER) CHEW Chew 1 tablet by mouth daily.    [provider]  nitroGLYCERIN (NITROSTAT) 0.4 MG SL tablet Place 1 tablet (0.4 mg total) under the tongue every 5 (five) minutes as needed for chest pain. 11/14/22 02/20/23  Patwardhan, Anabel Bene, MD  nitroGLYCERIN (NITROSTAT) 0.4 MG SL tablet Place 0.4 mg under the tongue every 5 (five) minutes as needed for chest pain.    [provider]  Olopatadine-Mometasone Cristal Generous) [phone] MCG/ACT SUSP Place 2 sprays into the nose 2 (two)  times daily as needed (as needed for runny nose, post-nasal drip or congestion). Patient not taking: Reported on 05/14/2023 04/17/23   Marcelyn Bruins, MD  Olopatadine-Mometasone Cristal Generous) [phone] MCG/ACT SUSP Place 2 sprays into the nose 2 (two) times daily as needed (as needed for runny nose, post-nasal drip or congestion.). Patient not taking: Reported on 05/14/2023 04/25/23   Marcelyn Bruins, MD  Pitavastatin Calcium 2 MG TABS TAKE 1 TABLET BY MOUTH EVERY DAY 05/22/23   Patwardhan, Anabel Bene, MD  polyethylene glycol (MIRALAX / GLYCOLAX) packet Take 17 g by mouth daily as needed for moderate constipation.    [provider]  pyridOXINE (VITAMIN B-6) 100 MG tablet Take 100 mg by mouth daily.     [provider]  spironolactone (ALDACTONE) 50 MG tablet Take 50 mg by mouth daily.     [provider]      Allergies    Amlodipine besylate, Hydralazine, Losartan potassium, Naproxen, Prednisone, Amlodipine, Isordil [isosorbide nitrate], and Losartan    Review of Systems   Review of Systems  Cardiovascular:  Positive for chest pain.  All other systems reviewed and are negative.   Physical Exam Updated Vital Signs BP 132/77   Pulse (!) 58   Temp 97.9 F (36.6 C)   Resp 18   Wt 53.5 kg   SpO2 97%   BMI 19.64 kg/m  Physical Exam Vitals and nursing note reviewed.  Constitutional:      General: She is not in acute distress.    Appearance: Normal appearance. She is normal weight. She is not ill-appearing.     Comments: Resting comfortably in bed  HENT:     Head: Normocephalic and atraumatic.  Pulmonary:     Effort: Pulmonary effort is normal. No respiratory distress.     Breath sounds: No decreased breath sounds, wheezing, rhonchi or rales.  Chest:     Chest wall: No tenderness.     Comments: No rash Abdominal:     General: Abdomen is flat.  Musculoskeletal:        General: Normal range of motion.     Cervical back: Neck supple.   Skin:    General: Skin is warm and dry.  Neurological:     Mental Status: She is alert and oriented to person, place, and time.  Psychiatric:        Mood and Affect: Mood normal.        Behavior: Behavior normal.     ED Results / Procedures / Treatments   Labs (all labs ordered are listed, but only abnormal results are displayed) Labs Reviewed  BASIC METABOLIC PANEL - Abnormal; Notable for the following components:      Result Value   Glucose, Bld 127 (*)    All other components within normal limits  CBC  TROPONIN I (HIGH SENSITIVITY)  TROPONIN I (HIGH SENSITIVITY)    EKG EKG Interpretation Date/Time:  Monday August 11 2023 11:46:29 EST Ventricular Rate:  74 PR Interval:  154 QRS Duration:  80 QT Interval:  374 QTC Calculation: 415 R Axis:   -44  Text Interpretation: Normal sinus rhythm Left axis deviation Anterior infarct , age undetermined Abnormal ECG When compared with ECG of 22-Jul-2023 11:25, PREVIOUS ECG IS PRESENT when compared to prior, faster rate no STEMI Confirmed by Theda Belfast ([id number]) on 08/11/2023 2:16:13 PM  Radiology DG Chest 2 View Result Date: 08/11/2023 CLINICAL DATA:  Left-sided chest pain EXAM: CHEST - 2 VIEW COMPARISON:  Chest radiograph dated 07/22/2023 FINDINGS: Normal lung volumes. No focal consolidations. No pleural effusion or pneumothorax. The heart size and mediastinal contours are within normal limits. No acute osseous abnormality. IMPRESSION: No active cardiopulmonary disease. Electronically Signed   By: Agustin Cree M.D.   On: 08/11/2023 14:31    Procedures Procedures    Medications Ordered in ED Medications - No data to display  ED Course/ Medical Decision Making/ A&P Clinical Course as of 08/11/23 1650  Mon Aug 11, 2023  1553 Spoke with cardiology Dr. Anne Fu who recommends close outpatient follow-up if second troponin is negative. [AS]    Clinical Course User Index [AS] Lula Olszewski Edsel Petrin, PA-C                                  Medical Decision Making Amount and/or Complexity of Data Reviewed Labs: ordered. Radiology: ordered.  This patient presents to the ED for concern of chest pain, this involves an extensive number of treatment options, and is a complaint that carries with it a high risk of complications and morbidity.  The emergent differential diagnosis of chest pain includes: Acute coronary syndrome, pericarditis, aortic dissection, pulmonary embolism, tension pneumothorax, and esophageal rupture.  I do not believe the patient has an emergent cause of chest pain, other urgent/non-acute considerations include, but are not limited to: chronic angina, aortic stenosis, cardiomyopathy, myocarditis, mitral valve prolapse, pulmonary hypertension, hypertrophic obstructive cardiomyopathy (HOCM), aortic insufficiency, right ventricular hypertrophy, pneumonia, pleuritis, bronchitis, pneumothorax, tumor, gastroesophageal reflux disease (GERD), esophageal spasm, Mallory-Weiss syndrome, peptic ulcer disease, biliary disease, pancreatitis, functional gastrointestinal pain, cervical or thoracic disk disease or arthritis, shoulder arthritis, costochondritis, subacromial bursitis, anxiety or panic attack, herpes zoster, breast disorders, chest wall tumors, thoracic outlet syndrome, mediastinitis.  My initial workup includes ACS rule out  Additional history obtained from: Nursing notes from this visit. Husband at bedside Patient no-show for cardiology appointment on 07/31/2023 with Dr. Rosemary Holms Chart review, nonobstructive mild CAD from heart cath on 04/01/2018  I ordered, reviewed and interpreted labs which include: BMP, CBC, troponin, delta troponin.  No leukocytosis or anemia.  No electrolyte derangement or kidney dysfunction.  Initial troponin negative.  Delta troponin negative.  I ordered imaging studies including chest x-ray I independently visualized and interpreted imaging which showed negative I agree with the  radiologist interpretation  Cardiac Monitoring:  The patient was maintained on a cardiac monitor.  I personally viewed and interpreted the cardiac monitored which showed an underlying rhythm of: NSR  Consultations Obtained:  I requested consultation with cardiology Dr. Anne Fu,  and discussed lab and imaging findings as well as pertinent plan - they recommend: Close outpatient follow-up  Afebrile, hemodynamically stable.  83 year old female presenting to the ED for evaluation of left-sided chest pain.  Episode lasting less than 15 minutes at approximately 8 AM today.  She had a recurrence of "chest discomfort" afterwards but has otherwise been asymptomatic.  Initial and delta  troponin negative.  Chest x-ray negative.  Patient does have a higher risk given age and history of CAD.  Discussed with cardiology due to this.  She does have an appointment with her cardiologist next week.  Given that she has 2 negative troponins, they feel it is reasonable to follow-up as an outpatient.  Discussed this with the patient who is in agreement.  She was given strict return precautions.  Stable at discharge.  At this time there does not appear to be any evidence of an acute emergency medical condition and the patient appears stable for discharge with appropriate outpatient follow up. Diagnosis was discussed with patient who verbalizes understanding of care plan and is agreeable to discharge. I have discussed return precautions with patient and husband at bedside who verbalizes understanding. Patient encouraged to follow-up with their PCP within 1 week. All questions answered.  Patient's case discussed with Dr. Rush Landmark who agrees with plan to discharge with follow-up.   Note: Portions of this report may have been transcribed using voice recognition software. Every effort was made to ensure accuracy; however, inadvertent computerized transcription errors may still be present.        Final Clinical  Impression(s) / ED Diagnoses Final diagnoses:  Atypical chest pain    Rx / DC Orders ED Discharge Orders     None         Michelle Piper, Cordelia Poche 08/11/23 1650    Tegeler, Canary Brim, MD 08/12/23 [phone]

## 2023-08-12 DIAGNOSIS — R109 Unspecified abdominal pain: Secondary | ICD-10-CM | POA: Diagnosis not present

## 2023-08-12 DIAGNOSIS — R945 Abnormal results of liver function studies: Secondary | ICD-10-CM | POA: Diagnosis not present

## 2023-08-19 ENCOUNTER — Encounter: Payer: Self-pay | Admitting: Physician Assistant

## 2023-08-19 ENCOUNTER — Ambulatory Visit: Payer: Medicare Other | Admitting: Physician Assistant

## 2023-08-25 DIAGNOSIS — E1169 Type 2 diabetes mellitus with other specified complication: Secondary | ICD-10-CM | POA: Diagnosis not present

## 2023-08-25 DIAGNOSIS — I10 Essential (primary) hypertension: Secondary | ICD-10-CM | POA: Diagnosis not present

## 2023-08-25 DIAGNOSIS — H35033 Hypertensive retinopathy, bilateral: Secondary | ICD-10-CM | POA: Diagnosis not present

## 2023-08-25 DIAGNOSIS — I700 Atherosclerosis of aorta: Secondary | ICD-10-CM | POA: Diagnosis not present

## 2023-08-27 ENCOUNTER — Encounter: Payer: Self-pay | Admitting: Cardiology

## 2023-08-27 ENCOUNTER — Ambulatory Visit: Attending: Cardiology | Admitting: Cardiology

## 2023-08-27 VITALS — BP 120/72 | HR 69 | Ht 66.5 in | Wt 124.8 lb

## 2023-08-27 DIAGNOSIS — R531 Weakness: Secondary | ICD-10-CM | POA: Diagnosis not present

## 2023-08-27 DIAGNOSIS — E782 Mixed hyperlipidemia: Secondary | ICD-10-CM | POA: Diagnosis not present

## 2023-08-27 DIAGNOSIS — M1612 Unilateral primary osteoarthritis, left hip: Secondary | ICD-10-CM | POA: Diagnosis not present

## 2023-08-27 DIAGNOSIS — R072 Precordial pain: Secondary | ICD-10-CM | POA: Insufficient documentation

## 2023-08-27 DIAGNOSIS — M25652 Stiffness of left hip, not elsewhere classified: Secondary | ICD-10-CM | POA: Diagnosis not present

## 2023-08-27 LAB — LIPID PANEL
Chol/HDL Ratio: 2.5 ratio (ref 0.0–4.4)
Cholesterol, Total: 165 mg/dL (ref 100–199)
HDL: 66 mg/dL (ref >39)
LDL Chol Calc (NIH): 85 mg/dL (ref 0–99)
Triglycerides: 75 mg/dL (ref 0–149)
VLDL Cholesterol Cal: 14 mg/dL (ref 5–40)

## 2023-08-27 NOTE — Patient Instructions (Signed)
 Lab Work: Lipid panel   If you have labs (blood work) drawn today and your tests are completely normal, you will receive your results only by: MyChart Message (if you have MyChart) OR A paper copy in the mail If you have any lab test that is abnormal or we need to change your treatment, we will call you to review the results.   Follow-Up: At [facility], you and your health needs are our priority.  As part of our continuing mission to provide you with exceptional heart care, we have created designated Provider Care Teams.  These Care Teams include your primary Cardiologist (physician) and Advanced Practice Providers (APPs -  Physician Assistants and Nurse Practitioners) who all work together to provide you with the care you need, when you need it.   Your next appointment:   6 month(s)  Provider:   Elder Negus, MD     Other Instructions    1st Floor: - Lobby - Registration  - Pharmacy  - Lab - Cafe  2nd Floor: - PV Lab - Diagnostic Testing (echo, CT, nuclear med)  3rd Floor: - Vacant  4th Floor: - TCTS (cardiothoracic surgery) - AFib Clinic - Structural Heart Clinic - Vascular Surgery  - Vascular Ultrasound  5th Floor: - HeartCare Cardiology (general and EP) - Clinical Pharmacy for coumadin, hypertension, lipid, weight-loss medications, and med management appointments    Valet parking services will be available as well.

## 2023-08-27 NOTE — Progress Notes (Signed)
  Cardiology Office Note:  .   Date:  08/27/2023  ID:  Sara Brown, DOB 1941/02/13, MRN [id number] PCP: Renford Dills, MD  [facility] Providers Cardiologist:  Truett Mainland, MD PCP: Renford Dills, MD  History of Present Illness: .    83 y/o African-American female with hypertension, diet controlled diabetes, hyperlipidemia,  GERD and remote history of TIA, mild nonobstructive coronary artery disease (Cath 04/01/2018).  Patient has had multiple ER visits in last 2 months with chest and back pain, ACS has been excluded several times.  At this time, she does not particularly have any significant exertional chest pain symptoms.  Most of her symptoms are related to lower back.  Blood pressure is very well-controlled.  She admits to not taking her statin regularly.   Patient has been intolerant to several medications-including but not limited to losartan, amlodipine, hydralazine, atenolol, nifedipine, clonidine, reluctant to use carvedilol and labetalol due to history of asthma. Most recently, her clonidine was stopped due to complaints of hair loss and minoxidil was started instead, but she does not want to take it due to concern of having heart disease.    ROS:  Review of Systems  Cardiovascular:  Negative for chest pain, dyspnea on exertion, leg swelling, palpitations and syncope.     Studies Reviewed: Marland Kitchen       EKG 08/12/2023: Sinus rhythm 72 bpm Left axis deviation Nonspecific T wave abnormality  Exercise nuclear stress test 11/25/2022 (independently interpreted by me): 7.0 METS  Small size, mild intensity, reversible perfusion defect in inferior myocardium.    Physical Exam:   Physical Exam Vitals and nursing note reviewed.  Constitutional:      General: She is not in acute distress. Neck:     Vascular: No JVD.  Cardiovascular:     Rate and Rhythm: Normal rate and regular rhythm.     Heart sounds: Normal heart sounds. No murmur heard. Pulmonary:      Effort: Pulmonary effort is normal.     Breath sounds: Normal breath sounds. No wheezing or rales.  Musculoskeletal:     Right lower leg: No edema.     Left lower leg: No edema.      ASSESSMENT AND PLAN: .    83 y/o African-American female with hypertension, diet controlled diabetes, hyperlipidemia,  GERD and remote history of TIA, mild nonobstructive coronary artery disease (Cath 04/01/2018).   Hypertension: Patient has been intolerant to several medications-including but not limited to losartan, amlodipine, hydralazine, atenolol, nifedipine, clonidine, reluctant to use carvedilol and labetalol due to history of asthma. Most recently, her clonidine was stopped due to complaints of hair loss and minoxidil was started instead, but she does not want to take it due to concern of having heart disease.   Currently very well-controlled on spironolactone 50 mg daily,  Coronary artery disease: Nonobstructive on cath in 2019. Recent chest pain is rather atypical.  Stress test in 11/2022 showed mild inferior ischemia.  There is able to manage this medically for now.  If worsening exertional chest pain, could consider heart catheterization in future.   Intolerant to several statins, takes between statin every other day. Check epicondyle.     F/u in 6 months  Signed, Elder Negus, MD

## 2023-08-28 ENCOUNTER — Encounter: Payer: Self-pay | Admitting: Cardiology

## 2023-08-28 DIAGNOSIS — M25652 Stiffness of left hip, not elsewhere classified: Secondary | ICD-10-CM | POA: Diagnosis not present

## 2023-08-28 DIAGNOSIS — M1612 Unilateral primary osteoarthritis, left hip: Secondary | ICD-10-CM | POA: Diagnosis not present

## 2023-08-28 DIAGNOSIS — R531 Weakness: Secondary | ICD-10-CM | POA: Diagnosis not present

## 2023-09-01 ENCOUNTER — Other Ambulatory Visit: Payer: Self-pay

## 2023-09-01 ENCOUNTER — Encounter (HOSPITAL_COMMUNITY): Payer: Self-pay | Admitting: Cardiology

## 2023-09-01 ENCOUNTER — Telehealth: Payer: Self-pay | Admitting: Cardiology

## 2023-09-01 ENCOUNTER — Emergency Department (HOSPITAL_BASED_OUTPATIENT_CLINIC_OR_DEPARTMENT_OTHER)
Admission: EM | Admit: 2023-09-01 | Discharge: 2023-09-01 | Disposition: A | Discharge status: Home / Self Care | Attending: Emergency Medicine | Admitting: Emergency Medicine

## 2023-09-01 ENCOUNTER — Emergency Department (HOSPITAL_BASED_OUTPATIENT_CLINIC_OR_DEPARTMENT_OTHER): Admitting: Radiology

## 2023-09-01 ENCOUNTER — Encounter (HOSPITAL_BASED_OUTPATIENT_CLINIC_OR_DEPARTMENT_OTHER): Payer: Self-pay

## 2023-09-01 DIAGNOSIS — R0789 Other chest pain: Secondary | ICD-10-CM | POA: Diagnosis not present

## 2023-09-01 DIAGNOSIS — E119 Type 2 diabetes mellitus without complications: Secondary | ICD-10-CM | POA: Insufficient documentation

## 2023-09-01 DIAGNOSIS — R0602 Shortness of breath: Secondary | ICD-10-CM

## 2023-09-01 DIAGNOSIS — Z7982 Long term (current) use of aspirin: Secondary | ICD-10-CM | POA: Insufficient documentation

## 2023-09-01 DIAGNOSIS — I10 Essential (primary) hypertension: Secondary | ICD-10-CM | POA: Insufficient documentation

## 2023-09-01 DIAGNOSIS — R079 Chest pain, unspecified: Secondary | ICD-10-CM

## 2023-09-01 LAB — BASIC METABOLIC PANEL
Anion gap: 10 (ref 5–15)
BUN: 17 mg/dL (ref 8–23)
CO2: 27 mmol/L (ref 22–32)
Calcium: 9.9 mg/dL (ref 8.9–10.3)
Chloride: 100 mmol/L (ref 98–111)
Creatinine, Ser: 0.75 mg/dL (ref 0.44–1.00)
GFR, Estimated: >60 mL/min (ref >60)
Glucose, Bld: 101 mg/dL — ABNORMAL HIGH (ref 70–99)
Potassium: 4.2 mmol/L (ref 3.5–5.1)
Sodium: 137 mmol/L (ref 135–145)

## 2023-09-01 LAB — CBC
HCT: 37.3 % (ref 36.0–46.0)
Hemoglobin: 12.5 g/dL (ref 12.0–15.0)
MCH: 30.4 pg (ref 26.0–34.0)
MCHC: 33.5 g/dL (ref 30.0–36.0)
MCV: 90.8 fL (ref 80.0–100.0)
Platelets: 230 10*3/uL (ref 150–400)
RBC: 4.11 MIL/uL (ref 3.87–5.11)
RDW: 14.4 % (ref 11.5–15.5)
WBC: 6.1 10*3/uL (ref 4.0–10.5)
nRBC: 0 % (ref 0.0–0.2)

## 2023-09-01 LAB — TROPONIN I (HIGH SENSITIVITY)
Troponin I (High Sensitivity): 3 ng/L (ref <18)
Troponin I (High Sensitivity): 3 ng/L (ref <18)

## 2023-09-01 NOTE — Telephone Encounter (Signed)
   Pt c/o of Chest Pain: STAT if active CP, including tightness, pressure, jaw pain, radiating pain to shoulder/upper arm/back, CP unrelieved by Nitro. Symptoms reported of SOB, nausea, vomiting, sweating.  1. Are you having CP right now? No   2. Are you experiencing any other symptoms (ex. SOB, nausea, vomiting, sweating)? "Not breathing quite right, but not as bad as it has been"   3. Is your CP continuous or coming and going? Coming and going   4. Have you taken Nitroglycerin? Yes   5. How long have you been experiencing CP? This morning  Patient stated her BP this morning at 8:34 AM was 101/63 HR 65  6. If NO CP at time of call then end call with telling Pt to call back or call 911 if Chest pain returns prior to return call from triage team.

## 2023-09-01 NOTE — ED Provider Notes (Addendum)
 [place] EMERGENCY DEPARTMENT AT [facility] Provider Note   CSN: [id number] Arrival date & time: 09/01/23  1156     History  Chief Complaint  Patient presents with   Chest Pain    Sara Brown is a 83 y.o. female.  HPI     83 year old female comes in with chief complaint of chest pain.  She has history of hypertension, angina, diabetes, TIA and a positive stress test.  Patient does not have any abnormal catheter stent history.  She comes to the ER because of an episode of chest pain that occurred earlier today.  She had left-sided chest discomfort described as possibly sharp, that was coming and going over the course of few minutes.  The symptoms were unprovoked and there was no associated nausea, vomiting, dizziness or sweating.  Patient does feel slightly short of breath, but it is not directly related to her chest pain.  Over the last few days, she has not had any exertional chest pain or shortness of breath.  Of note, patient has had intermittent episodes of chest pain over the last several months.  She has had ER visits and also a follow-up with Dr. Rosemary Holms, cardiology.  She is supposed to get a stress test on 3-26.  Today she did call the cardiology clinic and was advised to come to the ER.  Home Medications Prior to Admission medications   Medication Sig Start Date End Date Taking? Authorizing Provider  albuterol (VENTOLIN HFA) 108 (90 Base) MCG/ACT inhaler Inhale 2 puffs into the lungs every 4 (four) hours as needed for wheezing or shortness of breath (2 puffs every 4-6 hours as needed for cough/wheeze/shortness of breath/chest tightness.  May use 15-20 minutes prior to activity.). 04/17/23   Marcelyn Bruins, MD  alendronate (FOSAMAX) 70 MG tablet Take 70 mg by mouth every Saturday. 08/09/18   [provider]  aspirin EC 81 MG tablet Take 1 tablet (81 mg total) by mouth daily. Swallow whole. 01/07/22   Patwardhan, Anabel Bene, MD  Calcium  Citrate-Vitamin D 315-250 MG-UNIT TABS Take 1 tablet by mouth daily.    [provider]  carboxymethylcellulose (REFRESH PLUS) 0.5 % SOLN Place 1 drop into both eyes in the morning and at bedtime.    [provider]  Cholecalciferol (VITAMIN D3) 10 MCG (400 UNIT) CAPS Take 400 Units by mouth daily.    [provider]  Coenzyme Q10 (COQ10) 200 MG CAPS Take 200 mg by mouth daily.    [provider]  cycloSPORINE (RESTASIS) 0.05 % ophthalmic emulsion Place 1 drop into both eyes 2 (two) times daily.     [provider]  fluticasone (FLONASE) 50 MCG/ACT nasal spray Place 1 spray into both nostrils in the morning and at bedtime.    [provider]  fluticasone-salmeterol (WIXELA INHUB) 100-50 MCG/ACT AEPB Inhale 1 puff into the lungs 2 (two) times daily.    [provider]  Magnesium 250 MG TABS Take 250 mg by mouth daily.    [provider]  Multiple Vitamins-Minerals (CENTRUM SILVER) CHEW Chew 1 tablet by mouth daily.    [provider]  nitroGLYCERIN (NITROSTAT) 0.4 MG SL tablet Place 0.4 mg under the tongue every 5 (five) minutes as needed for chest pain.    [provider]  Olopatadine-Mometasone Cristal Generous) [phone] MCG/ACT SUSP Place 2 sprays into the nose 2 (two) times daily as needed (as needed for runny nose, post-nasal drip or congestion.). 04/25/23   Margo Aye  Elease Hashimoto, MD  Pitavastatin Calcium 2 MG TABS TAKE 1 TABLET BY MOUTH EVERY DAY 05/22/23   Patwardhan, Manish J, MD  polyethylene glycol (MIRALAX / GLYCOLAX) packet Take 17 g by mouth daily as needed for moderate constipation.    [provider]  pyridOXINE (VITAMIN B-6) 100 MG tablet Take 100 mg by mouth daily.     [provider]  spironolactone (ALDACTONE) 50 MG tablet Take 50 mg by mouth daily.     [provider]      Allergies    Amlodipine besylate, Hydralazine, Losartan potassium, Naproxen, Prednisone,  Amlodipine, Isordil [isosorbide nitrate], and Losartan    Review of Systems   Review of Systems  All other systems reviewed and are negative.   Physical Exam Updated Vital Signs BP 124/79   Pulse 81   Temp 98.2 F (36.8 C)   Resp 16   SpO2 97%  Physical Exam Vitals and nursing note reviewed.  Constitutional:      Appearance: She is well-developed.  HENT:     Head: Normocephalic and atraumatic.  Eyes:     Extraocular Movements: Extraocular movements intact.  Cardiovascular:     Rate and Rhythm: Normal rate.     Heart sounds: Normal heart sounds.  Pulmonary:     Effort: Pulmonary effort is normal.     Breath sounds: Normal breath sounds.  Musculoskeletal:     Cervical back: Normal range of motion and neck supple.  Skin:    General: Skin is dry.  Neurological:     Mental Status: She is alert and oriented to person, place, and time.     ED Results / Procedures / Treatments   Labs (all labs ordered are listed, but only abnormal results are displayed) Labs Reviewed  BASIC METABOLIC PANEL - Abnormal; Notable for the following components:      Result Value   Glucose, Bld 101 (*)    All other components within normal limits  CBC  TROPONIN I (HIGH SENSITIVITY)  TROPONIN I (HIGH SENSITIVITY)    EKG EKG Interpretation Date/Time:  Monday September 01 2023 12:04:24 EDT Ventricular Rate:  79 PR Interval:  158 QRS Duration:  80 QT Interval:  370 QTC Calculation: 424 R Axis:   -46  Text Interpretation: Normal sinus rhythm Low voltage QRS Left anterior fascicular block Cannot rule out Anterior infarct (cited on or before 11-Aug-2023) Abnormal ECG When compared with ECG of 11-Aug-2023 11:46, No significant change was found Confirmed by Derwood Kaplan [phone]) on 09/01/2023 2:16:40 PM  Radiology DG Chest 2 View Result Date: 09/01/2023 CLINICAL DATA:  Chest pain.  Shortness of breath. EXAM: CHEST - 2 VIEW COMPARISON:  08/11/2023. FINDINGS: Bilateral lung fields are clear.  Bilateral costophrenic angles are clear. Normal cardio-mediastinal silhouette. No acute osseous abnormalities. The soft tissues are within normal limits. IMPRESSION: *No active cardiopulmonary disease. Electronically Signed   By: Jules Schick M.D.   On: 09/01/2023 14:22    Procedures Procedures    Medications Ordered in ED Medications - No data to display  ED Course/ Medical Decision Making/ A&P             HEART Score: 4                    Medical Decision Making Amount and/or Complexity of Data Reviewed Labs: ordered. Radiology: ordered.   This patient presents to the ED with chief complaint(s) of chest pain with pertinent past medical history of hypertension, ? CAD, diabetes, TIA.chest  pain is atypical in nature, currently not present. the complaint involves an extensive differential diagnosis and also carries with it a high risk of complications and morbidity.    The differential diagnosis considered for this patient includes  ACS syndrome Aortic dissection /aneurysm Valvular disorder Myocarditis Pericarditis Pneumothorax Musculoskeletal pain PUD / Gastritis / Esophagitis Esophageal spasm  The initial plan is to get basic labs, delta troponin.  We will discuss the case with cardiology service as well.   Additional history obtained: Records reviewed  cardiology outpatient records including previous stress test and last cardiology visit from 3-19  Independent labs interpretation:  The following labs were independently interpreted: Basic labs, delta troponin are normal and reassuring.  Independent visualization and interpretation of imaging: - I independently visualized the following imaging with scope of interpretation limited to determining acute life threatening conditions related to emergency care: X-ray of the chest, which revealed no evidence of pneumothorax  Treatment and Reassessment: Patient reassessed.  She remains chest pain-free.  Consultation: -  Consulted or discussed management/test interpretation with external professional: Dr. Carmon Ginsberg. I discussed with him patient's presenting symptoms, previous history of abnormal stress test, recent cardiology visit with plans for nuclear stress test on 3-26.  Dr. Carmon Ginsberg is comfortable with patient following up for scheduled stress test as planned in the setting of her having to high-sensitivity troponins that are negative for ACS.   Patient is comfortable with this recommendation and prefers outpatient management as well.  Of note, at the time of discharge she also indicated to me that she has had some hot flashes for the last few days.  She also expressed on further review of system that she has had some weight loss.  It appears that patient is up-to-date with her screening for colon cancer and with Pap smears, but I advised that she has a PCP visit specifically to go over her concerns.  Final Clinical Impression(s) / ED Diagnoses Final diagnoses:  Atypical chest pain    Rx / DC Orders ED Discharge Orders     None          Derwood Kaplan, MD 09/01/23 1800

## 2023-09-01 NOTE — ED Triage Notes (Signed)
 Pt c/o 3/4 CP onset this AM, denies NV, SHOB, radiation. States she called PCP & was advised to come to ED for eval.   States she took 1NTG, 81 ASA, morning meds

## 2023-09-01 NOTE — Telephone Encounter (Signed)
 Spoke to patient and advised message from Dr. Rosemary Holms. Pt states that she will discuss with her husband about going to the ER. Lexiscan and echo ordered and sent to scheduling to contact patient for appointments.

## 2023-09-01 NOTE — Discharge Instructions (Signed)
 We saw you in the ER for the chest pain/shortness of breath. All of our cardiac workup is normal, including labs, EKG and chest X-RAY are normal. We are not sure what is causing your discomfort, but we feel comfortable sending you home at this time. The workup in the ER is not complete, and you should follow up with the cardiologist for stress test as planned.  Please return to the ER if you have worsening chest pain, shortness of breath, pain radiating to your jaw, shoulder, or back, sweats or fainting. Otherwise see the Cardiologist or your primary care doctor as requested.

## 2023-09-01 NOTE — Telephone Encounter (Signed)
 Patient has had ER visits with similar complaints recently, but completely denied having any chest pain or shortness of breath during her recent visit with me.  Safest thing is for her ER visit to rule out any acute event, although suspicion is low.  Given her recurrent symptoms, I do recommend echocardiogram, and Lexiscan nuclear stress test, regardless if she goes to the ER today or not.  Thanks MJP

## 2023-09-01 NOTE — Telephone Encounter (Signed)
 Called patient back about her message. Patient stated her Chest Pain was somewhat dull, on pain scale between a 3 and 4. Patient took nitroglycerin and it helped, but took longer than 5 minutes. Patient did not take second dose of nitroglycerin. Patient stated her BP was low earlier and now it's BP 129/78 and HR 69. Patient stated she feels a little better that her BP has gone up. Patient stated she has been having some SOB as well. Patient stated she feels fine right now. Will send message to Dr. Rosemary Holms and his nurse for further advisement.

## 2023-09-02 ENCOUNTER — Telehealth (HOSPITAL_COMMUNITY): Payer: Self-pay | Admitting: *Deleted

## 2023-09-02 NOTE — Telephone Encounter (Signed)
 Left message on voicemail per DPR in reference to upcoming appointment scheduled on  09/03/23 with detailed instructions given per Myocardial Perfusion Study Information Sheet for the test. LM to arrive 15 minutes early, and that it is imperative to arrive on time for appointment to keep from having the test rescheduled. If you need to cancel or reschedule your appointment, please call the office within 24 hours of your appointment. Failure to do so may result in a cancellation of your appointment, and a $50 no show fee. Phone number given for call back for any questions.

## 2023-09-03 ENCOUNTER — Encounter: Payer: Self-pay | Admitting: Cardiology

## 2023-09-03 ENCOUNTER — Ambulatory Visit (HOSPITAL_COMMUNITY): Attending: Cardiology

## 2023-09-03 DIAGNOSIS — R079 Chest pain, unspecified: Secondary | ICD-10-CM | POA: Diagnosis not present

## 2023-09-03 DIAGNOSIS — R0602 Shortness of breath: Secondary | ICD-10-CM | POA: Diagnosis not present

## 2023-09-03 LAB — MYOCARDIAL PERFUSION IMAGING
LV dias vol: 38 mL (ref 46–106)
LV sys vol: 11 mL
Nuc Stress EF: 72 %
Peak HR: 105 {beats}/min
Rest HR: 66 {beats}/min
Rest Nuclear Isotope Dose: 10.8 mCi
SDS: 1
SRS: 0
SSS: 1
ST Depression (mm): 0 mm
Stress Nuclear Isotope Dose: 31 mCi
TID: 0.8

## 2023-09-03 MED ORDER — TECHNETIUM TC 99M TETROFOSMIN IV KIT
31.0000 | PACK | Freq: Once | INTRAVENOUS | Status: AC | PRN
  Administered 2023-09-03: 31 via INTRAVENOUS

## 2023-09-03 MED ORDER — TECHNETIUM TC 99M TETROFOSMIN IV KIT
10.8000 | PACK | Freq: Once | INTRAVENOUS | Status: AC | PRN
Start: 2023-09-03 — End: 2023-09-03
  Administered 2023-09-03: 10.8 via INTRAVENOUS

## 2023-09-03 MED ORDER — REGADENOSON 0.4 MG/5ML IV SOLN
0.4000 mg | Freq: Once | INTRAVENOUS | Status: AC
  Administered 2023-09-03: 0.4 mg via INTRAVENOUS

## 2023-09-05 DIAGNOSIS — M25652 Stiffness of left hip, not elsewhere classified: Secondary | ICD-10-CM | POA: Diagnosis not present

## 2023-09-05 DIAGNOSIS — R531 Weakness: Secondary | ICD-10-CM | POA: Diagnosis not present

## 2023-09-05 DIAGNOSIS — M1612 Unilateral primary osteoarthritis, left hip: Secondary | ICD-10-CM | POA: Diagnosis not present

## 2023-09-08 ENCOUNTER — Telehealth: Payer: Self-pay | Admitting: Cardiology

## 2023-09-08 DIAGNOSIS — I700 Atherosclerosis of aorta: Secondary | ICD-10-CM | POA: Diagnosis not present

## 2023-09-08 DIAGNOSIS — F418 Other specified anxiety disorders: Secondary | ICD-10-CM | POA: Diagnosis not present

## 2023-09-08 DIAGNOSIS — E1169 Type 2 diabetes mellitus with other specified complication: Secondary | ICD-10-CM | POA: Diagnosis not present

## 2023-09-08 DIAGNOSIS — H35033 Hypertensive retinopathy, bilateral: Secondary | ICD-10-CM | POA: Diagnosis not present

## 2023-09-08 DIAGNOSIS — I10 Essential (primary) hypertension: Secondary | ICD-10-CM | POA: Diagnosis not present

## 2023-09-08 DIAGNOSIS — E7800 Pure hypercholesterolemia, unspecified: Secondary | ICD-10-CM | POA: Diagnosis not present

## 2023-09-08 DIAGNOSIS — M169 Osteoarthritis of hip, unspecified: Secondary | ICD-10-CM | POA: Diagnosis not present

## 2023-09-08 NOTE — Telephone Encounter (Signed)
 Patient was recently seen in [facility] ED for atypical chest pain and advised to follow up with PCP. PCP deferred her to cardiology since it's for CP, but since she was just seen on 3/19 she wanted to check with Dr. Rosemary Holms to see if he agrees prior to scheduling. Please advise.

## 2023-09-08 NOTE — Telephone Encounter (Signed)
 Patient contacted and advised. Pt agrees with plan.

## 2023-09-08 NOTE — Telephone Encounter (Signed)
 Recent stress test was reassuring, I do not think chest pain is due to coronary artery blockages.  Okay to see her in 6 months for follow-up.  Thanks MJP

## 2023-09-09 DIAGNOSIS — M25652 Stiffness of left hip, not elsewhere classified: Secondary | ICD-10-CM | POA: Diagnosis not present

## 2023-09-09 DIAGNOSIS — R531 Weakness: Secondary | ICD-10-CM | POA: Diagnosis not present

## 2023-09-09 DIAGNOSIS — M1612 Unilateral primary osteoarthritis, left hip: Secondary | ICD-10-CM | POA: Diagnosis not present

## 2023-09-11 DIAGNOSIS — N858 Other specified noninflammatory disorders of uterus: Secondary | ICD-10-CM | POA: Diagnosis not present

## 2023-09-11 DIAGNOSIS — N926 Irregular menstruation, unspecified: Secondary | ICD-10-CM | POA: Diagnosis not present

## 2023-09-11 DIAGNOSIS — M25561 Pain in right knee: Secondary | ICD-10-CM | POA: Diagnosis not present

## 2023-09-11 DIAGNOSIS — M25552 Pain in left hip: Secondary | ICD-10-CM | POA: Diagnosis not present

## 2023-09-11 DIAGNOSIS — M25562 Pain in left knee: Secondary | ICD-10-CM | POA: Diagnosis not present

## 2023-09-12 DIAGNOSIS — M25652 Stiffness of left hip, not elsewhere classified: Secondary | ICD-10-CM | POA: Diagnosis not present

## 2023-09-12 DIAGNOSIS — R531 Weakness: Secondary | ICD-10-CM | POA: Diagnosis not present

## 2023-09-12 DIAGNOSIS — M1612 Unilateral primary osteoarthritis, left hip: Secondary | ICD-10-CM | POA: Diagnosis not present

## 2023-09-16 DIAGNOSIS — R531 Weakness: Secondary | ICD-10-CM | POA: Diagnosis not present

## 2023-09-16 DIAGNOSIS — M1612 Unilateral primary osteoarthritis, left hip: Secondary | ICD-10-CM | POA: Diagnosis not present

## 2023-09-16 DIAGNOSIS — M25652 Stiffness of left hip, not elsewhere classified: Secondary | ICD-10-CM | POA: Diagnosis not present

## 2023-09-18 DIAGNOSIS — R531 Weakness: Secondary | ICD-10-CM | POA: Diagnosis not present

## 2023-09-18 DIAGNOSIS — M1612 Unilateral primary osteoarthritis, left hip: Secondary | ICD-10-CM | POA: Diagnosis not present

## 2023-09-18 DIAGNOSIS — M25652 Stiffness of left hip, not elsewhere classified: Secondary | ICD-10-CM | POA: Diagnosis not present

## 2023-09-23 DIAGNOSIS — E1169 Type 2 diabetes mellitus with other specified complication: Secondary | ICD-10-CM | POA: Diagnosis not present

## 2023-09-23 DIAGNOSIS — I700 Atherosclerosis of aorta: Secondary | ICD-10-CM | POA: Diagnosis not present

## 2023-09-23 DIAGNOSIS — H35033 Hypertensive retinopathy, bilateral: Secondary | ICD-10-CM | POA: Diagnosis not present

## 2023-09-23 DIAGNOSIS — I10 Essential (primary) hypertension: Secondary | ICD-10-CM | POA: Diagnosis not present

## 2023-09-26 ENCOUNTER — Ambulatory Visit (HOSPITAL_COMMUNITY)

## 2023-09-29 ENCOUNTER — Ambulatory Visit (HOSPITAL_COMMUNITY): Attending: Cardiology

## 2023-09-29 DIAGNOSIS — R079 Chest pain, unspecified: Secondary | ICD-10-CM | POA: Insufficient documentation

## 2023-09-29 DIAGNOSIS — R0602 Shortness of breath: Secondary | ICD-10-CM | POA: Insufficient documentation

## 2023-09-29 LAB — ECHOCARDIOGRAM COMPLETE
AR max vel: 3.75 cm2
AV Area VTI: 3.67 cm2
AV Area mean vel: 3.56 cm2
AV Mean grad: 5 mmHg
AV Peak grad: 8.9 mmHg
Ao pk vel: 1.5 m/s
Calc EF: 67.1 %
S' Lateral: 2.25 cm
Single Plane A2C EF: 70.3 %
Single Plane A4C EF: 66.2 %

## 2023-10-07 ENCOUNTER — Other Ambulatory Visit: Payer: Self-pay | Admitting: Internal Medicine

## 2023-10-07 DIAGNOSIS — Z1231 Encounter for screening mammogram for malignant neoplasm of breast: Secondary | ICD-10-CM | POA: Diagnosis not present

## 2023-10-07 DIAGNOSIS — F418 Other specified anxiety disorders: Secondary | ICD-10-CM | POA: Diagnosis not present

## 2023-10-07 DIAGNOSIS — E1169 Type 2 diabetes mellitus with other specified complication: Secondary | ICD-10-CM | POA: Diagnosis not present

## 2023-10-07 DIAGNOSIS — Z1331 Encounter for screening for depression: Secondary | ICD-10-CM | POA: Diagnosis not present

## 2023-10-07 DIAGNOSIS — E7800 Pure hypercholesterolemia, unspecified: Secondary | ICD-10-CM | POA: Diagnosis not present

## 2023-10-07 DIAGNOSIS — R634 Abnormal weight loss: Secondary | ICD-10-CM | POA: Diagnosis not present

## 2023-10-07 DIAGNOSIS — I10 Essential (primary) hypertension: Secondary | ICD-10-CM | POA: Diagnosis not present

## 2023-10-07 DIAGNOSIS — Z0000 Encounter for general adult medical examination without abnormal findings: Secondary | ICD-10-CM | POA: Diagnosis not present

## 2023-10-07 DIAGNOSIS — M8580 Other specified disorders of bone density and structure, unspecified site: Secondary | ICD-10-CM | POA: Diagnosis not present

## 2023-10-07 DIAGNOSIS — I700 Atherosclerosis of aorta: Secondary | ICD-10-CM | POA: Diagnosis not present

## 2023-10-07 DIAGNOSIS — K5900 Constipation, unspecified: Secondary | ICD-10-CM | POA: Diagnosis not present

## 2023-10-08 DIAGNOSIS — I700 Atherosclerosis of aorta: Secondary | ICD-10-CM | POA: Diagnosis not present

## 2023-10-08 DIAGNOSIS — H35033 Hypertensive retinopathy, bilateral: Secondary | ICD-10-CM | POA: Diagnosis not present

## 2023-10-08 DIAGNOSIS — E7800 Pure hypercholesterolemia, unspecified: Secondary | ICD-10-CM | POA: Diagnosis not present

## 2023-10-08 DIAGNOSIS — M169 Osteoarthritis of hip, unspecified: Secondary | ICD-10-CM | POA: Diagnosis not present

## 2023-10-08 DIAGNOSIS — F418 Other specified anxiety disorders: Secondary | ICD-10-CM | POA: Diagnosis not present

## 2023-10-08 DIAGNOSIS — I10 Essential (primary) hypertension: Secondary | ICD-10-CM | POA: Diagnosis not present

## 2023-10-08 DIAGNOSIS — E1169 Type 2 diabetes mellitus with other specified complication: Secondary | ICD-10-CM | POA: Diagnosis not present

## 2023-10-09 ENCOUNTER — Encounter (HOSPITAL_COMMUNITY): Payer: Self-pay | Admitting: Obstetrics and Gynecology

## 2023-10-09 NOTE — Progress Notes (Addendum)
 Spoke w/ via phone for pre-op interview--- [facility] needs dos---- CBC and BMP per surgeon. A1C and CBG per anesthesia.        Lab results------ Current, EKG CXR and Echo in Epic. COVID test -----patient states asymptomatic no test needed Arrive at -------0830 NPO after MN NO Solid Food.  Pre-Surgery Ensure or G2:  Med rec completed Medications to take morning of surgery ----- Wixela inhaler Diabetic medication ----- None AM of surgery.  GLP1 agonist last dose: GLP1 instructions:  Patient instructed no nail polish to be worn day of surgery Patient instructed to bring photo id and insurance card day of surgery Patient aware to have Driver (ride ) / caregiver    for 24 hours after surgery - Husband Harvis Ling Patient Special Instructions ----- Shower with antibacterial soap. Pre-Op special Instructions -----  Patient asked about stopping/holding ASA, pt informed she would need to call surgeons office for these instructions, pt verbalized understanding.  Patient concerned her BP will be too high since she was told to hold Aldactone  AM of procedure, explained to pt. Why we ask her to hold the medication.  Patient verbalized understanding of instructions that were given at this phone interview. Patient denies chest pain, sob, fever, cough at the interview.

## 2023-10-15 ENCOUNTER — Encounter (HOSPITAL_COMMUNITY): Payer: Self-pay

## 2023-10-15 NOTE — H&P (Signed)
 Sara Brown is an 83 y.o. female. She has experienced unexplained weight loss. She has stable fluid in endometrial canal by ultrasound that has not changed over the past few years, no adnexal mass, small uterine calcifications. She is worried there is something in the uterus contributing to or causing her weight loss  Pertinent Gynecological History: Last mammogram: normal Date: 2022 Last pap: normal Date: 2008 OB History: G2, P2002 LTCS x 2   Menstrual History: No LMP recorded. Patient is postmenopausal.    Past Medical History:  Diagnosis Date   Age-related osteoporosis without current pathological fracture 08/18/2020   Allergic rhinitis    Angina at rest [facility]) 02/09/2018   Arthritis    "fingers, knees" (02/09/2018)   Asthma    "stopped taking RX after dr said I don't have this" (02/09/2018)   Basal cell carcinoma    Bradycardia 02/09/2018   Carotid stenosis    ICA(L)   Chest pain due to coronary artery disease 02/09/2018   Chronic bronchitis (HCC)    Chronic constipation 08/18/2020   Complication of anesthesia    "felt the cut w/one of my c-sections" (02/09/2018)   Coronary artery disease involving native coronary artery of native heart without angina pectoris 09/17/2018   Diabetes mellitus type 2, controlled 03/31/2018   Eczema    EEG abnormal    Essential hypertension 03/31/2018   Generalized anxiety disorder    GERD (gastroesophageal reflux disease)    Hardening of the aorta (main artery of the heart)    Headache    "years ago; before menopause" (02/09/2018)   Hirsutism 06/12/2017   Hyperglycemia due to type 2 diabetes mellitus 03/09/2021   Hyperlipidemia    Hyperpigmentation 02/13/2017   Hypertensive retinopathy    Irritable bowel syndrome    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   Mixed hyperlipidemia 12/29/2020   Myringotomy tube status  04/03/2021   Neck mass 01/17/2017   Neuropathic pain 03/09/2021   OA (osteoarthritis)    Osteoporosis    Palpitations 03/16/2021   Paresthesias 09/05/2020   Personal history of colonic polyps    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   Pure hypercholesterolemia 08/18/2020   Shortness of breath 07/30/2007   Skin laxity 06/12/2017   TIA (transient ischemic attack) ?2005   Urticaria    Vitamin D  deficiency     Past Surgical History:  Procedure Laterality Date   CATARACT EXTRACTION W/ INTRAOCULAR LENS  IMPLANT, BILATERAL Bilateral    CESAREAN SECTION  1971; 1975   CORONARY PRESSURE/FFR STUDY N/A 04/01/2018   Procedure: INTRAVASCULAR PRESSURE WIRE/FFR STUDY;  Surgeon: Cody Das, MD;  Location: [facility] INVASIVE CV LAB;  Service: Cardiovascular;  Laterality: N/A;   LEFT HEART CATH AND CORONARY ANGIOGRAPHY N/A 04/01/2018   Procedure: LEFT HEART CATH AND CORONARY ANGIOGRAPHY;  Surgeon: Cody Das, MD;  Location: [facility] INVASIVE CV LAB;  Service: Cardiovascular;  Laterality: N/A;   TUBAL LIGATION  1975   TYMPANOSTOMY TUBE PLACEMENT Left 03/01/2021    Family History  Problem Relation Age of Onset   Diabetes Mellitus II Mother    Osteoporosis Mother    Hypertension Mother    Diabetes Mellitus II Father    CVA Sister    CAD Sister    Hypertension Sister    Osteoarthritis Sister    Alcoholism Brother    Epilepsy Brother    Breast cancer Cousin 31  Dementia Cousin        suspected; never confirmed   Allergic rhinitis Neg Hx    Angioedema Neg Hx    Asthma Neg Hx    Eczema Neg Hx    Urticaria Neg Hx     Social History:  reports that she has never smoked. She has never used smokeless tobacco. She reports that she does not drink alcohol  and does not use drugs.  Allergies:  Allergies  Allergen Reactions   Amlodipine  Besylate     Other reaction(s): rash and hives   Hydralazine      Other reaction(s): rash   Losartan  Potassium     Other reaction(s): rash and HIves    Naproxen Other (See Comments)    Patient preference  Other reaction(s): upset stomach   Prednisone Other (See Comments)    Patient Preference  Other reaction(s): upset stomach   Amlodipine  Hives   Isordil  [Isosorbide  Nitrate] Rash   Losartan  Rash    No medications prior to admission.    Review of Systems  Respiratory: Negative.    Cardiovascular: Negative.     Height 5' 6.5" (1.689 m), weight 53.1 kg. Physical Exam Constitutional:      Appearance: Normal appearance.  Cardiovascular:     Rate and Rhythm: Normal rate and regular rhythm.     Heart sounds: Normal heart sounds. No murmur heard. Pulmonary:     Effort: Pulmonary effort is normal. No respiratory distress.     Breath sounds: Normal breath sounds. No wheezing.  Abdominal:     General: There is no distension.     Palpations: Abdomen is soft. There is no mass.     Tenderness: There is no abdominal tenderness.  Genitourinary:    General: Normal vulva.     Comments: Normal uterus No adnexal mass Musculoskeletal:     Cervical back: Normal range of motion and neck supple.  Neurological:     Mental Status: She is alert.     No results found for this or any previous visit (from the past 24 hours).  No results found.  Assessment/Plan: Persistent fluid in endometrial cavity with irregular endometrium, also with unexplained weight loss.  Surgical procedure, risks, chances of removing/relieving this fluid collection all discussed, questions answered. Will admit for hysteroscopy, D&C, possible myosure resection of any lesions  [name] 10/15/2023, 7:31 PM

## 2023-10-16 ENCOUNTER — Ambulatory Visit (HOSPITAL_COMMUNITY): Payer: Self-pay | Admitting: Vascular Surgery

## 2023-10-16 ENCOUNTER — Encounter (HOSPITAL_COMMUNITY): Payer: Self-pay | Admitting: Obstetrics and Gynecology

## 2023-10-16 ENCOUNTER — Other Ambulatory Visit: Payer: Self-pay

## 2023-10-16 ENCOUNTER — Encounter (HOSPITAL_COMMUNITY)
Admission: RE | Disposition: A | Discharge status: Home / Self Care | Payer: Self-pay | Source: Home / Self Care | Attending: Obstetrics and Gynecology

## 2023-10-16 ENCOUNTER — Ambulatory Visit (HOSPITAL_BASED_OUTPATIENT_CLINIC_OR_DEPARTMENT_OTHER): Payer: Self-pay | Admitting: Vascular Surgery

## 2023-10-16 ENCOUNTER — Ambulatory Visit (HOSPITAL_COMMUNITY)
Admission: RE | Admit: 2023-10-16 | Discharge: 2023-10-16 | Disposition: A | Discharge status: Home / Self Care | Attending: Obstetrics and Gynecology | Admitting: Obstetrics and Gynecology

## 2023-10-16 DIAGNOSIS — Z8673 Personal history of transient ischemic attack (TIA), and cerebral infarction without residual deficits: Secondary | ICD-10-CM | POA: Diagnosis not present

## 2023-10-16 DIAGNOSIS — J45909 Unspecified asthma, uncomplicated: Secondary | ICD-10-CM

## 2023-10-16 DIAGNOSIS — E119 Type 2 diabetes mellitus without complications: Secondary | ICD-10-CM | POA: Diagnosis not present

## 2023-10-16 DIAGNOSIS — I10 Essential (primary) hypertension: Secondary | ICD-10-CM | POA: Diagnosis not present

## 2023-10-16 DIAGNOSIS — N858 Other specified noninflammatory disorders of uterus: Secondary | ICD-10-CM | POA: Insufficient documentation

## 2023-10-16 DIAGNOSIS — I2510 Atherosclerotic heart disease of native coronary artery without angina pectoris: Secondary | ICD-10-CM

## 2023-10-16 DIAGNOSIS — E1165 Type 2 diabetes mellitus with hyperglycemia: Secondary | ICD-10-CM

## 2023-10-16 DIAGNOSIS — N859 Noninflammatory disorder of uterus, unspecified: Secondary | ICD-10-CM

## 2023-10-16 HISTORY — PX: MYOSURE RESECTION: SHX7611

## 2023-10-16 HISTORY — PX: DILATATION & CURRETTAGE/HYSTEROSCOPY WITH RESECTOCOPE: SHX5572

## 2023-10-16 LAB — BASIC METABOLIC PANEL WITH GFR
Anion gap: 8 (ref 5–15)
BUN: 18 mg/dL (ref 8–23)
CO2: 21 mmol/L — ABNORMAL LOW (ref 22–32)
Calcium: 9.2 mg/dL (ref 8.9–10.3)
Chloride: 109 mmol/L (ref 98–111)
Creatinine, Ser: 0.76 mg/dL (ref 0.44–1.00)
GFR, Estimated: >60 mL/min (ref >60)
Glucose, Bld: 86 mg/dL (ref 70–99)
Potassium: 3.7 mmol/L (ref 3.5–5.1)
Sodium: 138 mmol/L (ref 135–145)

## 2023-10-16 LAB — CBC
HCT: 36.6 % (ref 36.0–46.0)
Hemoglobin: 11.9 g/dL — ABNORMAL LOW (ref 12.0–15.0)
MCH: 30.1 pg (ref 26.0–34.0)
MCHC: 32.5 g/dL (ref 30.0–36.0)
MCV: 92.7 fL (ref 80.0–100.0)
Platelets: 211 10*3/uL (ref 150–400)
RBC: 3.95 MIL/uL (ref 3.87–5.11)
RDW: 13.5 % (ref 11.5–15.5)
WBC: 7.2 10*3/uL (ref 4.0–10.5)
nRBC: 0 % (ref 0.0–0.2)

## 2023-10-16 LAB — HEMOGLOBIN A1C
Hgb A1c MFr Bld: 5.5 % (ref 4.8–5.6)
Mean Plasma Glucose: 111.15 mg/dL

## 2023-10-16 LAB — GLUCOSE, CAPILLARY
Glucose-Capillary: 90 mg/dL (ref 70–99)
Glucose-Capillary: 80 mg/dL (ref 70–99)

## 2023-10-16 SURGERY — DILATATION & CURETTAGE/HYSTEROSCOPY WITH RESECTOCOPE
Anesthesia: General

## 2023-10-16 MED ORDER — ACETAMINOPHEN 10 MG/ML IV SOLN: 1000.0000 mg | Freq: Once | INTRAVENOUS | Status: DC | PRN

## 2023-10-16 MED ORDER — FENTANYL CITRATE (PF) 250 MCG/5ML IJ SOLN
INTRAMUSCULAR | Status: DC | PRN
  Administered 2023-10-16: 50 ug via INTRAVENOUS

## 2023-10-16 MED ORDER — ACETAMINOPHEN 500 MG PO TABS
1000.0000 mg | ORAL_TABLET | Freq: Once | ORAL | Status: AC
  Administered 2023-10-16: 1000 mg via ORAL
  Filled 2023-10-16: qty 2

## 2023-10-16 MED ORDER — PROPOFOL 10 MG/ML IV BOLUS
INTRAVENOUS | Status: AC
  Filled 2023-10-16: qty 20

## 2023-10-16 MED ORDER — ONDANSETRON HCL 4 MG/2ML IJ SOLN
INTRAMUSCULAR | Status: AC
  Filled 2023-10-16: qty 2

## 2023-10-16 MED ORDER — LIDOCAINE HCL 2 % IJ SOLN
INTRAMUSCULAR | Status: DC | PRN
  Administered 2023-10-16: 10 mL

## 2023-10-16 MED ORDER — OXYCODONE HCL 5 MG PO TABS: 5.0000 mg | ORAL_TABLET | Freq: Once | ORAL | Status: DC | PRN

## 2023-10-16 MED ORDER — SODIUM CHLORIDE 0.9 % IR SOLN
Status: DC | PRN
  Administered 2023-10-16: 3000 mL

## 2023-10-16 MED ORDER — LACTATED RINGERS IV SOLN: INTRAVENOUS | Status: DC

## 2023-10-16 MED ORDER — DROPERIDOL 2.5 MG/ML IJ SOLN: 0.6250 mg | Freq: Once | INTRAMUSCULAR | Status: DC | PRN

## 2023-10-16 MED ORDER — LIDOCAINE 2% (20 MG/ML) 5 ML SYRINGE
INTRAMUSCULAR | Status: DC | PRN
Start: 2023-10-16 — End: 2023-10-16
  Administered 2023-10-16: 50 mg via INTRAVENOUS

## 2023-10-16 MED ORDER — FENTANYL CITRATE (PF) 250 MCG/5ML IJ SOLN
INTRAMUSCULAR | Status: AC
  Filled 2023-10-16: qty 5

## 2023-10-16 MED ORDER — PROPOFOL 10 MG/ML IV BOLUS
INTRAVENOUS | Status: DC | PRN
  Administered 2023-10-16: 90 mg via INTRAVENOUS
  Administered 2023-10-16: 20 mg via INTRAVENOUS

## 2023-10-16 MED ORDER — FENTANYL CITRATE (PF) 100 MCG/2ML IJ SOLN: 25.0000 ug | INTRAMUSCULAR | Status: DC | PRN

## 2023-10-16 MED ORDER — INSULIN ASPART 100 UNIT/ML IJ SOLN: 0.0000 [IU] | INTRAMUSCULAR | Status: DC | PRN

## 2023-10-16 MED ORDER — DEXAMETHASONE SODIUM PHOSPHATE 10 MG/ML IJ SOLN
INTRAMUSCULAR | Status: AC
  Filled 2023-10-16: qty 1

## 2023-10-16 MED ORDER — CHLORHEXIDINE GLUCONATE 0.12 % MT SOLN
15.0000 mL | Freq: Once | OROMUCOSAL | Status: AC
  Administered 2023-10-16: 15 mL via OROMUCOSAL
  Filled 2023-10-16: qty 15

## 2023-10-16 MED ORDER — LACTATED RINGERS IV SOLN
INTRAVENOUS | Status: DC
Start: 2023-10-16 — End: 2023-10-16

## 2023-10-16 MED ORDER — GLYCOPYRROLATE PF 0.2 MG/ML IJ SOSY
PREFILLED_SYRINGE | INTRAMUSCULAR | Status: DC | PRN
Start: 2023-10-16 — End: 2023-10-16
  Administered 2023-10-16: .1 mg via INTRAVENOUS

## 2023-10-16 MED ORDER — ORAL CARE MOUTH RINSE: 15.0000 mL | Freq: Once | OROMUCOSAL | Status: AC

## 2023-10-16 MED ORDER — OXYCODONE HCL 5 MG/5ML PO SOLN: 5.0000 mg | Freq: Once | ORAL | Status: DC | PRN

## 2023-10-16 MED ORDER — EPHEDRINE SULFATE-NACL 50-0.9 MG/10ML-% IV SOSY
PREFILLED_SYRINGE | INTRAVENOUS | Status: DC | PRN
  Administered 2023-10-16: 5 mg via INTRAVENOUS
  Administered 2023-10-16: 10 mg via INTRAVENOUS

## 2023-10-16 MED ORDER — LIDOCAINE HCL 2 % IJ SOLN
INTRAMUSCULAR | Status: AC
  Filled 2023-10-16: qty 20

## 2023-10-16 MED ORDER — LIDOCAINE 2% (20 MG/ML) 5 ML SYRINGE
INTRAMUSCULAR | Status: AC
  Filled 2023-10-16: qty 5

## 2023-10-16 MED ORDER — ONDANSETRON HCL 4 MG/2ML IJ SOLN
INTRAMUSCULAR | Status: DC | PRN
  Administered 2023-10-16: 4 mg via INTRAVENOUS

## 2023-10-16 MED ORDER — DEXAMETHASONE SODIUM PHOSPHATE 10 MG/ML IJ SOLN
INTRAMUSCULAR | Status: DC | PRN
Start: 2023-10-16 — End: 2023-10-16
  Administered 2023-10-16: 4 mg via INTRAVENOUS

## 2023-10-16 SURGICAL SUPPLY — 14 items
CATH ROBINSON RED A/P 16FR (CATHETERS) IMPLANT
DEVICE MYOSURE LITE (MISCELLANEOUS) IMPLANT
DEVICE MYOSURE REACH (MISCELLANEOUS) IMPLANT
DILATOR CANAL MILEX (MISCELLANEOUS) IMPLANT
GLOVE ORTHO TXT STRL SZ7.5 (GLOVE) ×1 IMPLANT
GLOVE SURG UNDER POLY LF SZ7 (GLOVE) ×1 IMPLANT
GOWN STRL REUS W/ TWL XL LVL3 (GOWN DISPOSABLE) ×1 IMPLANT
KIT PROCEDURE FLUENT (KITS) ×1 IMPLANT
KIT TURNOVER KIT B (KITS) ×1 IMPLANT
PACK VAGINAL MINOR WOMEN LF (CUSTOM PROCEDURE TRAY) ×1 IMPLANT
PAD OB MATERNITY 11 LF (PERSONAL CARE ITEMS) ×1 IMPLANT
SEAL ROD LENS SCOPE MYOSURE (ABLATOR) ×1 IMPLANT
TOWEL GREEN STERILE FF (TOWEL DISPOSABLE) ×1 IMPLANT
UNDERPAD 30X36 HEAVY ABSORB (UNDERPADS AND DIAPERS) ×1 IMPLANT

## 2023-10-16 NOTE — Discharge Instructions (Addendum)
 Routine instructions for hysteroscopy OTC Tylenol  prn pain    No acetaminophen /Tylenol  until after 3:45 pm today if needed.     Post Anesthesia Home Care Instructions  Activity: Get plenty of rest for the remainder of the day. A responsible individual must stay with you for 24 hours following the procedure.  For the next 24 hours, DO NOT: -Drive a car -Advertising copywriter -Drink alcoholic beverages -Take any medication unless instructed by your physician -Make any legal decisions or sign important papers.  Meals: Start with liquid foods such as gelatin or soup. Progress to regular foods as tolerated. Avoid greasy, spicy, heavy foods. If nausea and/or vomiting occur, drink only clear liquids until the nausea and/or vomiting subsides. Call your physician if vomiting continues.  Special Instructions/Symptoms: Your throat may feel dry or sore from the anesthesia or the breathing tube placed in your throat during surgery. If this causes discomfort, gargle with warm salt water. The discomfort should disappear within 24 hours.

## 2023-10-16 NOTE — Transfer of Care (Signed)
 Immediate Anesthesia Transfer of Care Note  Patient: Sara Brown  Procedure(s) Performed: DILATATION & CURETTAGE/HYSTEROSCOPY MYOSURE RESECTION  Patient Location: PACU  Anesthesia Type:General  Level of Consciousness: awake, alert , oriented, and patient cooperative  Airway & Oxygen  Therapy: Patient Spontanous Breathing and Patient connected to face mask oxygen   Post-op Assessment: Report given to RN, Post -op Vital signs reviewed and stable, and Patient moving all extremities X 4  Post vital signs: Reviewed and stable  Last Vitals:  Vitals Value Taken Time  BP 141/61 10/16/23 1124  Temp    Pulse 59 10/16/23 1127  Resp 12 10/16/23 1127  SpO2 100 % 10/16/23 1127  Vitals shown include unfiled device data.  Last Pain:  Vitals:   10/16/23 0842  TempSrc: Oral  PainSc: 0-No pain         Complications: No notable events documented.

## 2023-10-16 NOTE — Anesthesia Procedure Notes (Signed)
 Procedure Name: LMA Insertion Date/Time: 10/16/2023 10:43 AM  Performed by: Sherma Diver, CRNAPre-anesthesia Checklist: Patient identified, Patient being monitored, Timeout performed, Emergency Drugs available and Suction available Patient Re-evaluated:Patient Re-evaluated prior to induction Oxygen  Delivery Method: Circle system utilized Preoxygenation: Pre-oxygenation with 100% oxygen  Induction Type: IV induction Ventilation: Mask ventilation without difficulty LMA: LMA inserted LMA Size: 4.0 Tube type: Oral Number of attempts: 1 Placement Confirmation: positive ETCO2 and breath sounds checked- equal and bilateral Tube secured with: Tape Dental Injury: Teeth and Oropharynx as per pre-operative assessment  Comments: atraumatic

## 2023-10-16 NOTE — Interval H&P Note (Signed)
 History and Physical Interval Note:  10/16/2023 10:02 AM  Sara Brown  has presented today for surgery, with the diagnosis of endometrial disorder, fluid in cavity.  The various methods of treatment have been discussed with the patient and family. After consideration of risks, benefits and other options for treatment, the patient has consented to  Procedure(s): DILATATION & CURETTAGE/HYSTEROSCOPY WITH RESECTOCOPE (N/A) MYOSURE RESECTION (N/A) as a surgical intervention.  The patient's history has been reviewed, patient examined, no change in status, stable for surgery.  I have reviewed the patient's chart and labs.  Questions were answered to the patient's satisfaction.     Sara Brown

## 2023-10-16 NOTE — Anesthesia Postprocedure Evaluation (Signed)
 Anesthesia Post Note  Patient: Sara Brown  Procedure(s) Performed: DILATATION & CURETTAGE/HYSTEROSCOPY MYOSURE RESECTION     Patient location during evaluation: PACU Anesthesia Type: General Level of consciousness: awake and alert Pain management: pain level controlled Vital Signs Assessment: post-procedure vital signs reviewed and stable Respiratory status: spontaneous breathing, nonlabored ventilation, respiratory function stable and patient connected to nasal cannula oxygen  Cardiovascular status: blood pressure returned to baseline and stable Postop Assessment: no apparent nausea or vomiting Anesthetic complications: no   No notable events documented.  Last Vitals:  Vitals:   10/16/23 1200 10/16/23 1215  BP: 127/61 123/64  Pulse: 60 (!) 58  Resp: 12 12  Temp:    SpO2: 99% 96%    Last Pain:  Vitals:   10/16/23 1215  TempSrc:   PainSc: 0-No pain                 Lethaniel Rave

## 2023-10-16 NOTE — Anesthesia Preprocedure Evaluation (Addendum)
 Anesthesia Evaluation  Patient identified by MRN, date of birth, ID band Patient awake    Reviewed: Allergy  & Precautions, H&P , NPO status , Patient's Chart, lab work & pertinent test results  Airway Mallampati: II  TM Distance: >3 FB Neck ROM: Full    Dental no notable dental hx.    Pulmonary asthma    Pulmonary exam normal breath sounds clear to auscultation       Cardiovascular hypertension, (-) angina + CAD  Normal cardiovascular exam Rhythm:Regular Rate:Normal     Neuro/Psych  Headaches PSYCHIATRIC DISORDERS Anxiety     TIA   GI/Hepatic Neg liver ROS,GERD  ,,  Endo/Other  diabetes    Renal/GU negative Renal ROS   endometrial disorder, fluid in cavity    Musculoskeletal  (+) Arthritis ,    Abdominal   Peds negative pediatric ROS (+)  Hematology negative hematology ROS (+)   Anesthesia Other Findings   Reproductive/Obstetrics negative OB ROS                             Anesthesia Physical Anesthesia Plan  ASA: 3  Anesthesia Plan: General   Post-op Pain Management: Tylenol  PO (pre-op)*   Induction: Intravenous  PONV Risk Score and Plan: 3 and Ondansetron , Dexamethasone and Treatment may vary due to age or medical condition  Airway Management Planned: LMA  Additional Equipment:   Intra-op Plan:   Post-operative Plan: Extubation in OR  Informed Consent: I have reviewed the patients History and Physical, chart, labs and discussed the procedure including the risks, benefits and alternatives for the proposed anesthesia with the patient or authorized representative who has indicated his/her understanding and acceptance.     Dental advisory given  Plan Discussed with: CRNA  Anesthesia Plan Comments:         Anesthesia Quick Evaluation

## 2023-10-16 NOTE — Op Note (Signed)
 Preoperative diagnosis: Fluid in endometrial cavity Postoperative diagnosis: Same Procedure: Hysteroscopy, D&C, Myosure resection of endometrium Surgeon: Cyd Dowse M.D. Anesthesia: Gen. With an LMA, paracervical block Findings: She had a normal endometrial cavity with minimal tissue, cervix was stenotic and had mucousy discharge one dilated Estimated blood loss: Minimal Fluid deficit: Through the hysteroscope fluid deficit was about 100 cc Specimens: Endometrial resection sent for routine pathology Complications: None  Procedure in detail: The patient was taken to the operating room and placed in the dorsosupine position. General anesthesia was induced. She was placed in mobile stirrups and legs were elevated. Perineum and vagina were prepped and draped in the usual sterile fashion and bladder drained with a red Robinson catheter. A Graves speculum was inserted in the vagina and the anterior lip of the cervix was grasped with a single-tooth tenaculum. Paracervical block was performed with a total of 10 cc of 2% plain lidocaine . The uterus then sounded to 7 cm. The cervix was gradually dilated to a size 19 dilator with some difficulty due to stenosis. Mucousy discharge once dilated. The Myosure hysteroscope was inserted and good visualization was achieved. The endometrial cavity was normal with a few tiny nodular areas of tissue.  The Myosure Lite was inserted and all visualized endometrium was resected.  The endometrial cavity was now normal, no other visible lesions.  The hysteroscope was removed. Curettage was performed with a tiny curette, minimal if any tissue obtained. The single-tooth tenaculum was removed from the cervix and bleeding was controlled with pressure. All instruments were then removed from the vagina. The patient tolerated the procedure well and was taken to the recovery in stable condition. Counts were correct and she had PAS hose on throughout the procedure.

## 2023-10-17 ENCOUNTER — Encounter (HOSPITAL_COMMUNITY): Payer: Self-pay | Admitting: Obstetrics and Gynecology

## 2023-10-17 LAB — SURGICAL PATHOLOGY

## 2023-10-18 ENCOUNTER — Emergency Department (HOSPITAL_BASED_OUTPATIENT_CLINIC_OR_DEPARTMENT_OTHER)
Admission: EM | Admit: 2023-10-18 | Discharge: 2023-10-18 | Disposition: A | Discharge status: Home / Self Care | Attending: Emergency Medicine | Admitting: Emergency Medicine

## 2023-10-18 ENCOUNTER — Encounter (HOSPITAL_BASED_OUTPATIENT_CLINIC_OR_DEPARTMENT_OTHER): Payer: Self-pay

## 2023-10-18 ENCOUNTER — Other Ambulatory Visit: Payer: Self-pay

## 2023-10-18 DIAGNOSIS — I10 Essential (primary) hypertension: Secondary | ICD-10-CM | POA: Insufficient documentation

## 2023-10-18 DIAGNOSIS — R001 Bradycardia, unspecified: Secondary | ICD-10-CM | POA: Diagnosis not present

## 2023-10-18 DIAGNOSIS — E119 Type 2 diabetes mellitus without complications: Secondary | ICD-10-CM | POA: Diagnosis not present

## 2023-10-18 DIAGNOSIS — R42 Dizziness and giddiness: Secondary | ICD-10-CM | POA: Diagnosis not present

## 2023-10-18 DIAGNOSIS — Z7982 Long term (current) use of aspirin: Secondary | ICD-10-CM | POA: Insufficient documentation

## 2023-10-18 LAB — BASIC METABOLIC PANEL WITH GFR
Anion gap: 10 (ref 5–15)
BUN: 15 mg/dL (ref 8–23)
CO2: 26 mmol/L (ref 22–32)
Calcium: 10 mg/dL (ref 8.9–10.3)
Chloride: 103 mmol/L (ref 98–111)
Creatinine, Ser: 0.84 mg/dL (ref 0.44–1.00)
GFR, Estimated: >60 mL/min (ref >60)
Glucose, Bld: 97 mg/dL (ref 70–99)
Potassium: 4.1 mmol/L (ref 3.5–5.1)
Sodium: 139 mmol/L (ref 135–145)

## 2023-10-18 LAB — CBC WITH DIFFERENTIAL/PLATELET
Abs Immature Granulocytes: 0.01 10*3/uL (ref 0.00–0.07)
Basophils Absolute: 0 10*3/uL (ref 0.0–0.1)
Basophils Relative: 0 %
Eosinophils Absolute: 0.1 10*3/uL (ref 0.0–0.5)
Eosinophils Relative: 2 %
HCT: 37.2 % (ref 36.0–46.0)
Hemoglobin: 12.6 g/dL (ref 12.0–15.0)
Immature Granulocytes: 0 %
Lymphocytes Relative: 25 %
Lymphs Abs: 1.4 10*3/uL (ref 0.7–4.0)
MCH: 30.7 pg (ref 26.0–34.0)
MCHC: 33.9 g/dL (ref 30.0–36.0)
MCV: 90.7 fL (ref 80.0–100.0)
Monocytes Absolute: 0.5 10*3/uL (ref 0.1–1.0)
Monocytes Relative: 9 %
Neutro Abs: 3.4 10*3/uL (ref 1.7–7.7)
Neutrophils Relative %: 64 %
Platelets: 219 10*3/uL (ref 150–400)
RBC: 4.1 MIL/uL (ref 3.87–5.11)
RDW: 13.7 % (ref 11.5–15.5)
WBC: 5.4 10*3/uL (ref 4.0–10.5)
nRBC: 0 % (ref 0.0–0.2)

## 2023-10-18 LAB — MAGNESIUM: Magnesium: 2.2 mg/dL (ref 1.7–2.4)

## 2023-10-18 NOTE — ED Triage Notes (Signed)
 Pt reports having hysterectomy D&C on Thursday . Pt reports heart rate this AM was 47 bpm and elevated BP (161/55). Pt also endorses dizziness this AM. Pt reports hx of HTN but did not take meds this AM.

## 2023-10-18 NOTE — ED Provider Notes (Signed)
 [place] EMERGENCY DEPARTMENT AT [facility] Provider Note   CSN: [id number] Arrival date & time: 10/18/23  1010     History  Chief Complaint  Patient presents with   Dizziness    Sara Brown is a 83 y.o. female.  Patient is an 83 year old female with a past medical history of hypertension and diabetes presenting to the emergency department with dizziness and bradycardia.  The patient states that she had a procedure with her gynecologist on Thursday and felt well after the procedure.  She states that she did some yard work yesterday afternoon and in the evening started to feel unwell.  She states that she took her blood pressures and they were normal.  She is otherwise unable to specify how she was feeling unwell.  She states that she did not take her blood pressure medication yesterday since her blood pressures were normal.  She states when she woke up this morning she noted that her heart rate was low in the 40s.  She states that normally it is in the 50s to 60s.  She states that she did feel little lightheaded and dizzy.  She denies any nausea, vomiting, chest pain or shortness of breath.  She denies any numbness or weakness.  She states that she did take her spironolactone  this morning as her blood pressure was elevated to the 160s systolic but has not taken her doxazosin  which she normally takes 4 hours after the spironolactone .  The history is provided by the patient and the spouse.  Dizziness      Home Medications Prior to Admission medications   Medication Sig Start Date End Date Taking? Authorizing Provider  albuterol  (VENTOLIN  HFA) 108 (90 Base) MCG/ACT inhaler Inhale 2 puffs into the lungs every 4 (four) hours as needed for wheezing or shortness of breath (2 puffs every 4-6 hours as needed for cough/wheeze/shortness of breath/chest tightness.  May use 15-20 minutes prior to activity.). 04/17/23   Brian Campanile, MD  alendronate (FOSAMAX) 70 MG  tablet Take 70 mg by mouth every Saturday. 08/09/18   [provider]  aspirin  EC 81 MG tablet Take 1 tablet (81 mg total) by mouth daily. Swallow whole. 01/07/22   Patwardhan, Kaye Parsons, MD  Calcium  Citrate-Vitamin D  315-250 MG-UNIT TABS Take 1 tablet by mouth daily.    [provider]  carboxymethylcellulose (REFRESH PLUS) 0.5 % SOLN Place 1 drop into both eyes in the morning and at bedtime.    [provider]  Cholecalciferol  (VITAMIN D3) 10 MCG (400 UNIT) CAPS Take 400 Units by mouth daily.    [provider]  Coenzyme Q10 (COQ10) 200 MG CAPS Take 200 mg by mouth daily.    [provider]  cycloSPORINE  (RESTASIS ) 0.05 % ophthalmic emulsion Place 1 drop into both eyes 2 (two) times daily.     [provider]  doxazosin  (CARDURA ) 1 MG tablet Take 1 mg by mouth daily.    [provider]  fluticasone  (FLONASE ) 50 MCG/ACT nasal spray Place 1 spray into both nostrils in the morning and at bedtime.    [provider]  fluticasone -salmeterol (WIXELA INHUB) 100-50 MCG/ACT AEPB Inhale 1 puff into the lungs 2 (two) times daily.    [provider]  Magnesium  250 MG TABS Take 250 mg by mouth daily.    [provider]  Multiple Vitamins-Minerals (CENTRUM SILVER ) CHEW Chew 1 tablet by mouth daily.    [provider]  nitroGLYCERIN  (NITROSTAT ) 0.4 MG SL tablet  Place 0.4 mg under the tongue every 5 (five) minutes as needed for chest pain.    [provider]  Olopatadine-Mometasone  (RYALTRIS ) [phone] MCG/ACT SUSP Place 2 sprays into the nose 2 (two) times daily as needed (as needed for runny nose, post-nasal drip or congestion.). Patient not taking: Reported on 10/16/2023 04/25/23   Brian Campanile, MD  Pitavastatin  Calcium  2 MG TABS TAKE 1 TABLET BY MOUTH EVERY DAY 05/22/23   Patwardhan, Kaye Parsons, MD  polyethylene glycol (MIRALAX  / GLYCOLAX ) packet Take 17 g by mouth daily as needed for moderate  constipation.    [provider]  pyridOXINE  (VITAMIN B-6) 100 MG tablet Take 100 mg by mouth daily.     [provider]  spironolactone  (ALDACTONE ) 50 MG tablet Take 50 mg by mouth daily.     [provider]      Allergies    Amlodipine  besylate, Clonidine  derivatives, Hydralazine , Losartan  potassium, Minoxidil , Naproxen, Prednisone, Amlodipine , Isordil  [isosorbide  nitrate], and Losartan     Review of Systems   Review of Systems  Neurological:  Positive for dizziness.    Physical Exam Updated Vital Signs BP (!) 142/77   Pulse (!) 59   Temp 98.2 F (36.8 C) (Oral)   Resp 18   Ht 5\' 6"  (1.676 m)   Wt 53.1 kg   SpO2 100%   BMI 18.88 kg/m  Physical Exam Vitals and nursing note reviewed.  Constitutional:      General: She is not in acute distress.    Appearance: Normal appearance.  HENT:     Head: Normocephalic and atraumatic.     Nose: Nose normal.     Mouth/Throat:     Mouth: Mucous membranes are moist.     Pharynx: Oropharynx is clear.  Eyes:     Extraocular Movements: Extraocular movements intact.     Conjunctiva/sclera: Conjunctivae normal.     Pupils: Pupils are equal, round, and reactive to light.     Comments: No nystagmus  Cardiovascular:     Rate and Rhythm: Normal rate and regular rhythm.     Heart sounds: Normal heart sounds.  Pulmonary:     Effort: Pulmonary effort is normal.     Breath sounds: Normal breath sounds.  Abdominal:     General: Abdomen is flat.     Palpations: Abdomen is soft.     Tenderness: There is no abdominal tenderness.  Musculoskeletal:        General: Normal range of motion.     Cervical back: Normal range of motion.  Skin:    General: Skin is warm and dry.  Neurological:     General: No focal deficit present.     Mental Status: She is alert and oriented to person, place, and time.     Cranial Nerves: No cranial nerve deficit.     Sensory: No sensory deficit.     Motor: No weakness.     Coordination:  Coordination normal.     Gait: Gait normal.  Psychiatric:        Mood and Affect: Mood normal.        Behavior: Behavior normal.     ED Results / Procedures / Treatments   Labs (all labs ordered are listed, but only abnormal results are displayed) Labs Reviewed  BASIC METABOLIC PANEL WITH GFR  CBC WITH DIFFERENTIAL/PLATELET  MAGNESIUM     EKG EKG Interpretation Date/Time:  Saturday Oct 18 2023 10:23:04 EDT Ventricular Rate:  61 PR Interval:  156 QRS Duration:  98 QT Interval:  394 QTC Calculation: 397 R Axis:   -38  Text Interpretation: Sinus rhythm Probable left atrial enlargement Left axis deviation Low voltage, precordial leads No significant change since last tracing Confirmed by Celesta Coke ([id number]) on 10/18/2023 10:26:14 AM  Radiology No results found.  Procedures Procedures    Medications Ordered in ED Medications - No data to display  ED Course/ Medical Decision Making/ A&P Clinical Course as of 10/18/23 1141  Sat Oct 18, 2023  1114 Orthostatics negative. [VK]  1124 Labs within normal range. Patient has ambulated normally in the ED, remained in sinus rhythm, currently asymptomatic. She is stable for discharge home with outpatient follow up. [VK]    Clinical Course User Index [VK] Kingsley, Rudolpho Claxton K, DO                                 Medical Decision Making This patient presents to the ED with chief complaint(s) of dizziness, bradycardia with pertinent past medical history of hypertension, diabetes which further complicates the presenting complaint. The complaint involves an extensive differential diagnosis and also carries with it a high risk of complications and morbidity.    The differential diagnosis includes arrhythmia, anemia, dehydration, electrolyte abnormality, orthostatic hypotension, no signs of vertigo or neurologic deficits to suggest CVA on exam  Additional history obtained: Additional history obtained from spouse Records reviewed  previous admission documents and outpatient cardiology records  ED Course and Reassessment: On patient's arrival she is hemodynamically stable in no acute distress.  EKG on arrival showed normal sinus rhythm without acute ischemic changes.  Heart rates are in the high 50s to low 60s on the monitor which she states is at her baseline.  Patient will have labs to evaluate for possible electrolyte derangement or anemia causing her dizziness as well as orthostatics and will be closely reassessed.  Independent labs interpretation:  The following labs were independently interpreted: within normal range  Independent visualization of imaging: - N/A  Consultation: - Consulted or discussed management/test interpretation w/ external professional: N/A  Consideration for admission or further workup: Patient has no emergent conditions requiring admission or further work-up at this time and is stable for discharge home with primary care follow-up  Social Determinants of health: N/A    Amount and/or Complexity of Data Reviewed Labs: ordered.          Final Clinical Impression(s) / ED Diagnoses Final diagnoses:  None    Rx / DC Orders ED Discharge Orders     None         Kingsley, Dore Oquin K, DO 10/18/23 1142

## 2023-10-18 NOTE — Discharge Instructions (Signed)
 You were seen in the emergency department for your dizziness and your low heart rate.  You are in a normal heart rhythm here and your blood work and electrolytes were normal.  Is unclear why your heart rate today is lower than usual however you can follow-up with your primary doctor and your cardiologist for further management and monitoring.  You can return to the emergency department if you are having worsening dizziness and you pass out, your heart rate is in the 30s and you are feeling dizzy, you have severe chest pain or if you have any other new or concerning symptoms.

## 2023-10-18 NOTE — ED Notes (Signed)
 Pt CA&O to baseline. D/C instructions discussed with patient and all questions answered. All belongings with patient. Pt ambulatory on discharge.

## 2023-10-22 ENCOUNTER — Ambulatory Visit
Admission: RE | Admit: 2023-10-22 | Discharge: 2023-10-22 | Disposition: A | Discharge status: Home / Self Care | Source: Ambulatory Visit | Attending: Internal Medicine | Admitting: Internal Medicine

## 2023-10-22 DIAGNOSIS — Z1231 Encounter for screening mammogram for malignant neoplasm of breast: Secondary | ICD-10-CM

## 2023-10-23 DIAGNOSIS — I10 Essential (primary) hypertension: Secondary | ICD-10-CM | POA: Diagnosis not present

## 2023-10-23 DIAGNOSIS — I700 Atherosclerosis of aorta: Secondary | ICD-10-CM | POA: Diagnosis not present

## 2023-10-23 DIAGNOSIS — H35033 Hypertensive retinopathy, bilateral: Secondary | ICD-10-CM | POA: Diagnosis not present

## 2023-10-23 DIAGNOSIS — E1169 Type 2 diabetes mellitus with other specified complication: Secondary | ICD-10-CM | POA: Diagnosis not present

## 2023-10-24 DIAGNOSIS — R634 Abnormal weight loss: Secondary | ICD-10-CM | POA: Diagnosis not present

## 2023-10-24 DIAGNOSIS — R109 Unspecified abdominal pain: Secondary | ICD-10-CM | POA: Diagnosis not present

## 2023-10-29 DIAGNOSIS — L989 Disorder of the skin and subcutaneous tissue, unspecified: Secondary | ICD-10-CM | POA: Diagnosis not present

## 2023-10-29 DIAGNOSIS — R1030 Lower abdominal pain, unspecified: Secondary | ICD-10-CM | POA: Diagnosis not present

## 2023-11-05 ENCOUNTER — Ambulatory Visit (INDEPENDENT_AMBULATORY_CARE_PROVIDER_SITE_OTHER): Payer: Medicare Other | Admitting: Podiatry

## 2023-11-05 ENCOUNTER — Encounter: Payer: Self-pay | Admitting: Podiatry

## 2023-11-05 ENCOUNTER — Telehealth: Payer: Self-pay | Admitting: Cardiology

## 2023-11-05 DIAGNOSIS — M79674 Pain in right toe(s): Secondary | ICD-10-CM | POA: Diagnosis not present

## 2023-11-05 DIAGNOSIS — E1142 Type 2 diabetes mellitus with diabetic polyneuropathy: Secondary | ICD-10-CM

## 2023-11-05 DIAGNOSIS — M79675 Pain in left toe(s): Secondary | ICD-10-CM | POA: Diagnosis not present

## 2023-11-05 DIAGNOSIS — L84 Corns and callosities: Secondary | ICD-10-CM

## 2023-11-05 DIAGNOSIS — B351 Tinea unguium: Secondary | ICD-10-CM

## 2023-11-05 NOTE — Telephone Encounter (Signed)
 Pt c/o BP issue: STAT if pt c/o blurred vision, one-sided weakness or slurred speech.  STAT if BP is GREATER than 180/120 TODAY.  STAT if BP is LESS than 90/60 and SYMPTOMATIC TODAY  1. What is your BP concern? BP too low    2. Have you taken any BP medication today?Not yet   3. What are your last 5 BP readings?107/59  4. Are you having any other symptoms (ex. Dizziness, headache, blurred vision, passed out)? Yesterday a little dizzy

## 2023-11-05 NOTE — Progress Notes (Signed)
 Subjective:  Patient ID: Sara Brown, female    DOB: August 28, 1940,   MRN: [id number]  Chief Complaint  Patient presents with   Nail Problem    Nail trim    83 y.o. female presents for concern of thickened elongated and painful nails that are difficult to trim. Requesting to have them trimmed today. Relates burning and tingling in their feet. Patient is diabetic and last A1c was  Lab Results  Component Value Date   HGBA1C 5.5 10/16/2023   .   PCP:  Merl Star, MD    . Denies any other pedal complaints. Denies n/v/f/c.   Past Medical History:  Diagnosis Date   Age-related osteoporosis without current pathological fracture 08/18/2020   Allergic rhinitis    Angina at [facility]) 02/09/2018   Arthritis    "fingers, knees" (02/09/2018)   Asthma    "stopped taking RX after dr said I don't have this" (02/09/2018)   Basal cell carcinoma    Bradycardia 02/09/2018   Carotid stenosis    ICA(L)   Chest pain due to coronary artery disease 02/09/2018   Chronic bronchitis (HCC)    Chronic constipation 08/18/2020   Complication of anesthesia    "felt the cut w/one of my c-sections" (02/09/2018)   Coronary artery disease involving native coronary artery of native heart without angina pectoris 09/17/2018   Diabetes mellitus type 2, controlled 03/31/2018   Eczema    EEG abnormal    Essential hypertension 03/31/2018   Generalized anxiety disorder    GERD (gastroesophageal reflux disease)    Hardening of the aorta (main artery of the heart)    Headache    "years ago; before menopause" (02/09/2018)   Hirsutism 06/12/2017   Hyperglycemia due to type 2 diabetes mellitus 03/09/2021   Hyperlipidemia    Hyperpigmentation 02/13/2017   Hypertensive retinopathy    Irritable bowel syndrome    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   Mixed hyperlipidemia 12/29/2020    Myringotomy tube status 04/03/2021   Neck mass 01/17/2017   Neuropathic pain 03/09/2021   OA (osteoarthritis)    Osteoporosis    Palpitations 03/16/2021   Paresthesias 09/05/2020   Personal history of colonic polyps    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 CFT <3 seconds. Absent hair growth on digits. Edema noted to bilateral lower extremities. Xerosis noted bilaterally.  Skin. No lacerations or abrasions bilateral feet. Nails 1-5 bilateral  are thickened discolored and elongated with subungual debris. Hyperkeratotic tissue noted to sub metatarsal first head on right and sub third metatarsal head on left improved.  Musculoskeletal: MMT 5/5 bilateral lower extremities in DF, PF, Inversion and Eversion. Deceased ROM in DF of ankle joint.  Neurological: Sensation intact to light touch. Protective sensation diminished bilateral.    Assessment:   1. Pain due to onychomycosis of toenails of both feet   2. Diabetic peripheral neuropathy associated with type 2 diabetes mellitus (HCC)      Plan:  Patient was evaluated and treated and all questions answered. -Discussed and educated patient on diabetic foot care, especially with  regards to the vascular, neurological and musculoskeletal systems.  -Mechanically  debrided all nails 1-5 bilateral using sterile nail nipper without incident  -Answered all patient questions -Patient to return  in 3 months   for at risk foot care -Patient advised to call the office if any problems or questions arise in the meantime.   Jennefer Moats, DPM

## 2023-11-05 NOTE — Telephone Encounter (Signed)
Returned call to patient left message on personal voice to call back.

## 2023-11-06 NOTE — Telephone Encounter (Signed)
 Spoke with Sara Brown, yesterday her blood pressure was 107/59/55 and she did have a little dizziness with walking around. This morning, her bp was 114/63/53 prior to medication and while on the phone with me it was 142/79. She reports she has a strange feeling in the back of her neck and her lips and tongue feel finny. She reports this happens when her blood pressure elevates. She does not indorse dizziness today but states she does not feel right. She has taken her spironolactone  this morning and is going to take her doxazosin  now. She is asking to be seen. Follow up scheduled

## 2023-11-06 NOTE — Telephone Encounter (Signed)
 Left message for pt to call.

## 2023-11-08 DIAGNOSIS — M169 Osteoarthritis of hip, unspecified: Secondary | ICD-10-CM | POA: Diagnosis not present

## 2023-11-08 DIAGNOSIS — E7800 Pure hypercholesterolemia, unspecified: Secondary | ICD-10-CM | POA: Diagnosis not present

## 2023-11-08 DIAGNOSIS — H35033 Hypertensive retinopathy, bilateral: Secondary | ICD-10-CM | POA: Diagnosis not present

## 2023-11-08 DIAGNOSIS — I10 Essential (primary) hypertension: Secondary | ICD-10-CM | POA: Diagnosis not present

## 2023-11-08 DIAGNOSIS — I700 Atherosclerosis of aorta: Secondary | ICD-10-CM | POA: Diagnosis not present

## 2023-11-08 DIAGNOSIS — E1169 Type 2 diabetes mellitus with other specified complication: Secondary | ICD-10-CM | POA: Diagnosis not present

## 2023-11-08 DIAGNOSIS — F418 Other specified anxiety disorders: Secondary | ICD-10-CM | POA: Diagnosis not present

## 2023-11-10 ENCOUNTER — Encounter: Payer: Self-pay | Admitting: Physician Assistant

## 2023-11-10 ENCOUNTER — Ambulatory Visit: Attending: Physician Assistant | Admitting: Physician Assistant

## 2023-11-10 VITALS — BP 110/80 | HR 65 | Ht 66.0 in | Wt 120.0 lb

## 2023-11-10 DIAGNOSIS — R42 Dizziness and giddiness: Secondary | ICD-10-CM | POA: Diagnosis not present

## 2023-11-10 DIAGNOSIS — I10 Essential (primary) hypertension: Secondary | ICD-10-CM

## 2023-11-10 DIAGNOSIS — I2510 Atherosclerotic heart disease of native coronary artery without angina pectoris: Secondary | ICD-10-CM

## 2023-11-10 DIAGNOSIS — E785 Hyperlipidemia, unspecified: Secondary | ICD-10-CM | POA: Diagnosis not present

## 2023-11-10 NOTE — Progress Notes (Signed)
 Cardiology Office Note   Date:  11/10/2023  ID:  Sara, Brown [birth year], MRN [id number] PCP: Merl Star, MD  [facility] Providers Cardiologist:  Cody Das, MD     History of Present Illness Sara Brown is a 83 y.o. 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  She has many intolerances including but not limited to losartan , amlodipine , hydralazine , atenolol, nifedipine , clonidine  and reluctant to use carvedilol  and the labetalol  due to history of asthma.  She previously came off of clonidine  due to complaint of hair loss and minoxidil  due to side effect.  He was last seen by Dr. Filiberto Hug in March 2025.  She admits to not taking her statin regularly.  76-month follow-up was recommended.  Since the last visit, patient was seen in the ED on 09/01/2023 due to atypical chest pain.  She had a low risk stress test on 09/03/2023 with no sign of ischemia, EF 72%.  There was small area of increased extracardiac signal near the apex but no reversible ischemia.  She underwent dilatation and curettage/hysteroscopy and MyoSure resection of the endometrium on 10/16/2023.  This was done due to fluid in the endometrial cavity.  She was seen in the ED on 10/18/2023 for dizziness and bradycardia.  Heart rate in the ED was in the high 50s to low 60s.  Blood work shows stable renal function and electrolyte and red blood cell count.   Patient presents today for follow-up.  She still occasionally has some atypical chest discomfort.  She denies any shortness of breath.  She has no lower extremity edema, orthopnea or PND.  She does not have significant dizziness this week, but did have mild dizziness last week when her blood pressure was low.  Her blood pressure is normal this week.  Based on her home blood pressure diary, there was only 1 day where her systolic blood  pressure was 97, otherwise, most of the time of her systolic blood pressures between 100-130s.  I did a manual blood pressure check in the office and it was 132/70.  I recommended continue on the current therapy.  If her systolic blood pressures persistently below 100 mmHg in the future, we can cut back spironolactone  to 25 mg daily.  She can keep follow-up with Dr. Filiberto Hug in September.  ROS:   She denies chest pain, palpitations, dyspnea, pnd, orthopnea, n, v, syncope, edema, weight gain, or early satiety. All other systems reviewed and are otherwise negative except as noted above.    Studies Reviewed      Cardiac Studies & Procedures   ______________________________________________________________________________________________ CARDIAC CATHETERIZATION  CARDIAC CATHETERIZATION 04/01/2018  Conclusion LM: Normal LAD: Prox-mid 40% stenosis. iFR 0.99 (Physiologically nonsignificant) LCx: Dominant. Small branches with mild disease RCA: Nondominant. Normal  LVEDP mildly elevated.  Recommendation: Continue aggressive risk factor modification. Continue aspirin , statin. Recommend Bidil  20-37.5 mg bid for blood pressure control, as well as potential vasodilator properties/  Cody Das, MD [facility] [place] Pager: [phone] Office: [phone] If no answer Cell [phone]  Findings Coronary Findings Diagnostic  Dominance: Left  Left Main Vessel is normal in caliber. Vessel is angiographically normal.  Left Anterior Descending Vessel is normal in caliber. Prox LAD to Mid LAD lesion is 40% stenosed. Pressure wire/FFR was performed on the lesion. FFR: 0.99. long 40% lesion. Vasospam with slow flow seen in LAD, improved with IC NTG  Left Circumflex Vessel is  normal in caliber. Vessel is angiographically normal. The vessel is tortuous.  First Obtuse Marginal Branch Vessel is small in size. There is mild disease in the vessel.  Second Obtuse Marginal  Branch Vessel is small in size. There is mild disease in the vessel.  Right Coronary Artery Vessel is small. Vessel is angiographically normal.  Intervention  No interventions have been documented.   STRESS TESTS  MYOCARDIAL PERFUSION IMAGING 09/03/2023  Narrative   Findings are consistent with no ischemia. The study is low risk.   No ST deviation was noted.   LV perfusion is normal.   Left ventricular function is normal. Nuclear stress EF: 72%. The left ventricular ejection fraction is hyperdynamic (>65%). End diastolic cavity size is normal. End systolic cavity size is normal.   Prior study available for comparison from 11/25/2022. There are changes compared to prior study which appear to be improved. The left ventricular ejection fraction has increased. Small reversible inferior perfusion defect, LVEF 57%, low risk  Small area of increased extracardiac signal near the apex, but no reversible ischemia. LVEF 72% with normal wall motion. This is a low risk study. Compared to the prior study in 2017, the perfusion defects have improved and the LVEF is higher.   ECHOCARDIOGRAM  ECHOCARDIOGRAM COMPLETE 09/29/2023  Narrative ECHOCARDIOGRAM REPORT    Patient Name:   Sara Brown [facility] Date of Exam: 09/29/2023 Medical Rec #:  [id number]              Height:       66.5 in Accession #:    [id number]             Weight:       124.0 lb Date of Birth:  February 09, 1941              BSA:          1.641 m Patient Age:    82 years               BP:           120/72 mmHg Patient Gender: F                      HR:           62 bpm. Exam Location:  [address]  Procedure: 2D Echo, Cardiac Doppler and Color Doppler (Both Spectral and Color Flow Doppler were utilized during procedure).  Indications:    DoE; R06.9 DOE  History:        Patient has prior history of Echocardiogram examinations. Signs/Symptoms:Chest Pain and Shortness of Breath.  Sonographer:    Alicia Inoue Referring Phys:  [id number] [facility] J PATWARDHAN  IMPRESSIONS   1. Left ventricular ejection fraction, by estimation, is 65 to 70%. The left ventricle has normal function. The left ventricle has no regional wall motion abnormalities. Left ventricular diastolic parameters are consistent with Grade I diastolic dysfunction (impaired relaxation). 2. Right ventricular systolic function is normal. The right ventricular size is normal. There is normal pulmonary artery systolic pressure. The estimated right ventricular systolic pressure is 24.2 mmHg. 3. The mitral valve is grossly normal. Trivial mitral valve regurgitation. No evidence of mitral stenosis. 4. The aortic valve is tricuspid. Aortic valve regurgitation is not visualized. No aortic stenosis is present. 5. The inferior vena cava is normal in size with greater than 50% respiratory variability, suggesting right atrial pressure of 3 mmHg.  Comparison(s): No significant change from prior study.  FINDINGS Left Ventricle:  Left ventricular ejection fraction, by estimation, is 65 to 70%. The left ventricle has normal function. The left ventricle has no regional wall motion abnormalities. The left ventricular internal cavity size was normal in size. There is no left ventricular hypertrophy. Left ventricular diastolic parameters are consistent with Grade I diastolic dysfunction (impaired relaxation).  Right Ventricle: The right ventricular size is normal. No increase in right ventricular wall thickness. Right ventricular systolic function is normal. There is normal pulmonary artery systolic pressure. The tricuspid regurgitant velocity is 2.30 m/s, and with an assumed right atrial pressure of 3 mmHg, the estimated right ventricular systolic pressure is 24.2 mmHg.  Left Atrium: Left atrial size was normal in size.  Right Atrium: Right atrial size was normal in size.  Pericardium: There is no evidence of pericardial effusion.  Mitral Valve: The mitral valve is grossly  normal. Trivial mitral valve regurgitation. No evidence of mitral valve stenosis.  Tricuspid Valve: The tricuspid valve is grossly normal. Tricuspid valve regurgitation is mild . No evidence of tricuspid stenosis.  Aortic Valve: The aortic valve is tricuspid. Aortic valve regurgitation is not visualized. No aortic stenosis is present. Aortic valve mean gradient measures 5.0 mmHg. Aortic valve peak gradient measures 8.9 mmHg. Aortic valve area, by VTI measures 3.67 cm.  Pulmonic Valve: The pulmonic valve was grossly normal. Pulmonic valve regurgitation is not visualized. No evidence of pulmonic stenosis.  Aorta: The aortic root and ascending aorta are structurally normal, with no evidence of dilitation.  Venous: The right lower pulmonary vein is normal. The inferior vena cava is normal in size with greater than 50% respiratory variability, suggesting right atrial pressure of 3 mmHg.  IAS/Shunts: The atrial septum is grossly normal.   LEFT VENTRICLE PLAX 2D LVIDd:         4.23 cm     Diastology LVIDs:         2.25 cm     LV e' medial:    4.57 cm/s LV PW:         1.19 cm     LV E/e' medial:  15.6 LV IVS:        0.79 cm     LV e' lateral:   5.98 cm/s LVOT diam:     2.40 cm     LV E/e' lateral: 11.9 LV SV:         123 LV SV Index:   75 LVOT Area:     4.52 cm  LV Volumes (MOD) LV vol d, MOD A2C: 47.1 ml LV vol d, MOD A4C: 48.8 ml LV vol s, MOD A2C: 14.0 ml LV vol s, MOD A4C: 16.5 ml LV SV MOD A2C:     33.1 ml LV SV MOD A4C:     48.8 ml LV SV MOD BP:      32.1 ml  RIGHT VENTRICLE RV Basal diam:  3.10 cm RV Mid diam:    2.15 cm RV S prime:     17.60 cm/s TAPSE (M-mode): 2.1 cm  LEFT ATRIUM             Index        RIGHT ATRIUM           Index LA Vol (A2C):   41.9 ml 25.53 ml/m  RA Area:     14.20 cm LA Vol (A4C):   35.4 ml 21.57 ml/m  RA Volume:   31.50 ml  19.19 ml/m LA Biplane Vol: 40.8 ml 24.86 ml/m AORTIC VALVE AV Area (Vmax):  3.75 cm AV Area (Vmean):   3.56  cm AV Area (VTI):     3.67 cm AV Vmax:           149.50 cm/s AV Vmean:          100.400 cm/s AV VTI:            0.334 m AV Peak Grad:      8.9 mmHg AV Mean Grad:      5.0 mmHg LVOT Vmax:         124.00 cm/s LVOT Vmean:        79.100 cm/s LVOT VTI:          0.271 m LVOT/AV VTI ratio: 0.81  AORTA Ao Sinus diam: 3.37 cm Ao STJ diam:   2.5 cm Ao Asc diam:   3.10 cm  MV E velocity: 71.30 cm/s  TRICUSPID VALVE MV A velocity: 94.30 cm/s  TR Peak grad:   21.2 mmHg MV E/A ratio:  0.76        TR Vmax:        230.00 cm/s  SHUNTS Systemic VTI:  0.27 m Systemic Diam: 2.40 cm  Jackquelyn Mass MD Electronically signed by Jackquelyn Mass MD Signature Date/Time: 09/29/2023/5:32:14 PM    Final          ______________________________________________________________________________________________      Risk Assessment/Calculations           Physical Exam VS:  BP 110/80   Pulse 65   Ht 5\' 6"  (1.676 m)   Wt 120 lb (54.4 kg)   SpO2 97%   BMI 19.37 kg/m    Wt Readings from Last 3 Encounters:  11/10/23 120 lb (54.4 kg)  10/18/23 117 lb (53.1 kg)  10/16/23 117 lb 12.8 oz (53.4 kg)    GEN: Well nourished, well developed in no acute distress NECK: No JVD; No carotid bruits CARDIAC: RRR, no murmurs, rubs, gallops RESPIRATORY:  Clear to auscultation without rales, wheezing or rhonchi  ABDOMEN: Soft, non-tender, non-distended EXTREMITIES:  No edema; No deformity   ASSESSMENT AND PLAN  Dizziness: Mild dizziness last week, this has resolved.  Blood pressure and heart rate are normal.  Will continue on the current therapy  CAD: History of nonobstructive disease.  Denies any significant anginal symptoms, she does occasionally have some atypical chest pain  Hypertension: Blood pressure stable  Hyperlipidemia: Continue pitavastatin .        Dispo: Follow-up with Dr. Filiberto Hug in September  Signed, Shaylee Stanislawski, [place]

## 2023-11-10 NOTE — Patient Instructions (Signed)
 Medication Instructions:  NO CHANGES *If you need a refill on your cardiac medications before your next appointment, please call your pharmacy*  Lab Work: NO LABS If you have labs (blood work) drawn today and your tests are completely normal, you will receive your results only by: MyChart Message (if you have MyChart) OR A paper copy in the mail If you have any lab test that is abnormal or we need to change your treatment, we will call you to review the results.  Testing/Procedures: NO TESTING  Follow-Up: At [facility], you and your health needs are our priority.  As part of our continuing mission to provide you with exceptional heart care, our providers are all part of one team.  This team includes your primary Cardiologist (physician) and Advanced Practice Providers or APPs (Physician Assistants and Nurse Practitioners) who all work together to provide you with the care you need, when you need it.  Your next appointment:   FOLLOW UP SEPTEMBER 2025   Provider:   Cody Das, MD

## 2023-11-13 DIAGNOSIS — J029 Acute pharyngitis, unspecified: Secondary | ICD-10-CM | POA: Diagnosis not present

## 2023-11-13 DIAGNOSIS — Z03818 Encounter for observation for suspected exposure to other biological agents ruled out: Secondary | ICD-10-CM | POA: Diagnosis not present

## 2023-11-13 DIAGNOSIS — F418 Other specified anxiety disorders: Secondary | ICD-10-CM | POA: Diagnosis not present

## 2023-11-13 DIAGNOSIS — R109 Unspecified abdominal pain: Secondary | ICD-10-CM | POA: Diagnosis not present

## 2023-11-18 ENCOUNTER — Telehealth: Payer: Self-pay | Admitting: Cardiology

## 2023-11-18 NOTE — Telephone Encounter (Signed)
 Returned call to patient-   Patient state she has not been feeling well all day long. She states around 430 this morning, she awoken by pain in her left calf. She doesn't think this is related to her blood pressure but it is still sore. She states she normally takes her ASA and spiro between 8-9, she took it this morning around 0925 today, made her self a note to check bp 1237 113/59 HR 68. She states it was almost time for her doxazosin  bc she takes it 4 hours after morning meds- At 1518 her BP was 105/68, 127/69 with an avg of 114/66. BP was still low so she didn't take her doxazosin  until 1540. She states she just doesn't feel right but she cannot explain it. Patient states she wants to talk to V. Patel. Advised patient that we generally recommend holding medication for BP less than 110.

## 2023-11-18 NOTE — Telephone Encounter (Signed)
 Pt c/o BP issue: STAT if pt c/o blurred vision, one-sided weakness or slurred speech.  STAT if BP is GREATER than 180/120 TODAY.  STAT if BP is LESS than 90/60 and SYMPTOMATIC TODAY  1. What is your BP concern? Fluctuation in BP  2. Have you taken any BP medication today? Just took it around 3pm  3. What are your last 5 BP readings? This morning before meds 119/70 hr 60, 113/59 hr 68, this afternoon 105/68 hr 54, 127/69 hr 59, 114/66 hr 60  4. Are you having any other symptoms (ex. Dizziness, headache, blurred vision, passed out)? Just doesn't feel like normal self, pain in left calf in middle of night which has improved. Requesting to speak with V. Lydia Sams

## 2023-11-19 NOTE — Telephone Encounter (Signed)
 Spoke to patient, she will start taking BP meds at her regular time and if her BP remains persistently low we will reduce spironolactone  dose to 25 mg daily in future.

## 2023-11-22 DIAGNOSIS — I10 Essential (primary) hypertension: Secondary | ICD-10-CM | POA: Diagnosis not present

## 2023-11-22 DIAGNOSIS — E1169 Type 2 diabetes mellitus with other specified complication: Secondary | ICD-10-CM | POA: Diagnosis not present

## 2023-11-22 DIAGNOSIS — H35033 Hypertensive retinopathy, bilateral: Secondary | ICD-10-CM | POA: Diagnosis not present

## 2023-11-22 DIAGNOSIS — I700 Atherosclerosis of aorta: Secondary | ICD-10-CM | POA: Diagnosis not present

## 2023-11-26 ENCOUNTER — Telehealth: Payer: Self-pay | Admitting: Podiatry

## 2023-12-08 DIAGNOSIS — I10 Essential (primary) hypertension: Secondary | ICD-10-CM | POA: Diagnosis not present

## 2023-12-08 DIAGNOSIS — F418 Other specified anxiety disorders: Secondary | ICD-10-CM | POA: Diagnosis not present

## 2023-12-08 DIAGNOSIS — I700 Atherosclerosis of aorta: Secondary | ICD-10-CM | POA: Diagnosis not present

## 2023-12-08 DIAGNOSIS — H35033 Hypertensive retinopathy, bilateral: Secondary | ICD-10-CM | POA: Diagnosis not present

## 2023-12-08 DIAGNOSIS — M169 Osteoarthritis of hip, unspecified: Secondary | ICD-10-CM | POA: Diagnosis not present

## 2023-12-08 DIAGNOSIS — E1169 Type 2 diabetes mellitus with other specified complication: Secondary | ICD-10-CM | POA: Diagnosis not present

## 2023-12-08 DIAGNOSIS — E7800 Pure hypercholesterolemia, unspecified: Secondary | ICD-10-CM | POA: Diagnosis not present

## 2023-12-10 ENCOUNTER — Other Ambulatory Visit

## 2023-12-18 DIAGNOSIS — L810 Postinflammatory hyperpigmentation: Secondary | ICD-10-CM | POA: Diagnosis not present

## 2023-12-18 DIAGNOSIS — I10 Essential (primary) hypertension: Secondary | ICD-10-CM | POA: Diagnosis not present

## 2023-12-18 DIAGNOSIS — I2510 Atherosclerotic heart disease of native coronary artery without angina pectoris: Secondary | ICD-10-CM | POA: Diagnosis not present

## 2023-12-18 DIAGNOSIS — M792 Neuralgia and neuritis, unspecified: Secondary | ICD-10-CM | POA: Diagnosis not present

## 2023-12-18 DIAGNOSIS — D1722 Benign lipomatous neoplasm of skin and subcutaneous tissue of left arm: Secondary | ICD-10-CM | POA: Diagnosis not present

## 2023-12-18 DIAGNOSIS — E7800 Pure hypercholesterolemia, unspecified: Secondary | ICD-10-CM | POA: Diagnosis not present

## 2023-12-18 DIAGNOSIS — L821 Other seborrheic keratosis: Secondary | ICD-10-CM | POA: Diagnosis not present

## 2023-12-18 DIAGNOSIS — M8580 Other specified disorders of bone density and structure, unspecified site: Secondary | ICD-10-CM | POA: Diagnosis not present

## 2023-12-18 DIAGNOSIS — L819 Disorder of pigmentation, unspecified: Secondary | ICD-10-CM | POA: Diagnosis not present

## 2023-12-18 DIAGNOSIS — D1721 Benign lipomatous neoplasm of skin and subcutaneous tissue of right arm: Secondary | ICD-10-CM | POA: Diagnosis not present

## 2023-12-18 DIAGNOSIS — E119 Type 2 diabetes mellitus without complications: Secondary | ICD-10-CM | POA: Diagnosis not present

## 2023-12-22 DIAGNOSIS — I10 Essential (primary) hypertension: Secondary | ICD-10-CM | POA: Diagnosis not present

## 2023-12-22 DIAGNOSIS — I700 Atherosclerosis of aorta: Secondary | ICD-10-CM | POA: Diagnosis not present

## 2023-12-22 DIAGNOSIS — H35033 Hypertensive retinopathy, bilateral: Secondary | ICD-10-CM | POA: Diagnosis not present

## 2023-12-22 DIAGNOSIS — E1169 Type 2 diabetes mellitus with other specified complication: Secondary | ICD-10-CM | POA: Diagnosis not present

## 2023-12-29 ENCOUNTER — Telehealth: Payer: Self-pay | Admitting: Cardiology

## 2023-12-29 NOTE — Telephone Encounter (Signed)
 Pt c/o medication issue:  1. Name of Medication:  doxazosin  (CARDURA ) 1 MG tablet  spironolactone  (ALDACTONE ) 50 MG tablet   2. How are you currently taking this medication (dosage and times per day)?   As prescribed  3. Are you having a reaction (difficulty breathing--STAT)?   4. What is your medication issue?   Patient wants a call back to discuss reducing these medications.

## 2023-12-30 ENCOUNTER — Telehealth: Payer: Self-pay | Admitting: Family Medicine

## 2023-12-30 DIAGNOSIS — D1722 Benign lipomatous neoplasm of skin and subcutaneous tissue of left arm: Secondary | ICD-10-CM | POA: Diagnosis not present

## 2023-12-30 DIAGNOSIS — L6681 Central centrifugal cicatricial alopecia: Secondary | ICD-10-CM | POA: Diagnosis not present

## 2023-12-30 DIAGNOSIS — L819 Disorder of pigmentation, unspecified: Secondary | ICD-10-CM | POA: Diagnosis not present

## 2023-12-30 DIAGNOSIS — D1721 Benign lipomatous neoplasm of skin and subcutaneous tissue of right arm: Secondary | ICD-10-CM | POA: Diagnosis not present

## 2023-12-30 DIAGNOSIS — D1723 Benign lipomatous neoplasm of skin and subcutaneous tissue of right leg: Secondary | ICD-10-CM | POA: Diagnosis not present

## 2023-12-30 NOTE — Telephone Encounter (Unsigned)
 Copied from CRM [id number]. Topic: Appointments - Scheduling Inquiry for Clinic >> Dec 30, 2023  3:27 PM DeAngela L wrote: Reason for CRM: Patient would like to ask if Dr. Myrla would accept her as a new patient and Tennie Griffiths highly referred Dr B. To her   Pt num [phone] (M) Please don't respond through MyChart

## 2024-01-01 NOTE — Telephone Encounter (Signed)
 I am currently only accepting new patients that are newborns or relatives of my current patients. I can recommend Dr Donzella, Dr Lang or Dr Franchot to her thouhg.

## 2024-01-02 ENCOUNTER — Other Ambulatory Visit

## 2024-01-02 ENCOUNTER — Ambulatory Visit (INDEPENDENT_AMBULATORY_CARE_PROVIDER_SITE_OTHER): Admitting: Neurology

## 2024-01-02 ENCOUNTER — Encounter: Payer: Self-pay | Admitting: Neurology

## 2024-01-02 VITALS — BP 155/77 | HR 66 | Ht 65.5 in | Wt 117.0 lb

## 2024-01-02 DIAGNOSIS — G629 Polyneuropathy, unspecified: Secondary | ICD-10-CM

## 2024-01-02 NOTE — Progress Notes (Signed)
 NEUROLOGY FOLLOW UP OFFICE NOTE  Sara Brown [id number] 1941-03-11  HISTORY OF PRESENT ILLNESS: I had the pleasure of seeing Kamla Skilton in follow-up in the neurology clinic on 01/02/2024.  The patient was last seen almost a year ago. She is accompanied by her husband who helps supplement the history today. She was seen in our office for head sensations and concern for seizures (she does not have seizures). She presents today for new symptoms of toes feeling strange, per notes she reported movement in her toes. DM has been controlled, normal Hb1c. She denies any visible movement, she has a funny feeling in the toes of both feet, worse in the big toes. They feel fuzzy wuzzy. Every now and then, there is a pain in the big toes, no numbness. She feels something moving inside the toes but her toes are not moving. Hands are unaffected. She has neck pains, she previously had back pain that improved with Physical Therapy.   She has noticed discolored brown spots on her skin. She has constipation, no incontinence. She fell on the stairs on 7/3 while holding things. 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     History on Initial Assessment 03/01/2021: This is an 83 year old right-handed woman with a history of hypertension, hyperlipidemia, diabetes, neuropathy, left carotid stenosis, presenting for second opinion regarding seizures. She had been seeing neurologist Dr. Buck since 2016 for several year history of intermittent abnormal sensations in her right face, right arm, pulling sensation in right arm and shoulders. Work-up in 2016 showed a normal EEG and unremarkable brain MRI. She was in the ER in 02/2018 for facial paresthesias and discomfort, at that time she reported symptoms on the left arm and face, as well as tongue. Head CT no acute changes. He had a repeat brain MRI in 1/202 with no acute changes, mild chronic microvascular disease. She was in the ER in 08/2020 due to elevated BP,  she reported symptoms on the left side of her face, lip, and tongue. She also felt an abnormal sensation in her left leg like something was moving, making her feel as though she could not walk, bu tin the ER, she reported it may have occurred on the right leg but not as pronounced on the left. MRI brain and MRA head and neck in 08/2020 did not show any acute changes or significant stenosis. EEG in 08/2020 reported focal slowing over the left temporal region. She was discharged home on Levetiracetam  500mg  BID. She was back in the ER on 11/13/20 for sudden-onset right-sided numbness involving the forehead, face, lip, arm and leg. She also reported clumsiness and heaviness in the right leg. Another brain MRI done did not show any acute changes, MRA no significant stenosis. EEG again reported left temporal slowing. Levetiracetam  was continued and she was advised to take DAPT with aspirin  and Plavix  for 3 weeks, the Plavix  thereafter.   She also notes that when her BP goes too high or low, she gets symptoms on the side of her face. That day in June, she felt it on the right side, with tightening or heaviness in her tongue and lip, no physical changes visible to her husband. Her husband denies any staring/unresponsive episodes. She states her tongue is heavy right now because her BP is low. She had also been reporting headaches to Dr. Buck. She states the pain on the top her her head has not been recent, she cannot recall the last time she had  a headache. She states it is not a headache but a stabbing kind of pain. She feels dizzy when her BP is off. She is asking about her memory, she reports being tested and score was 23/30. She is usually pretty good with remembering her medications. She uses the computer frequently and denies forgetting passwords. She has not been driving, but was not getting lost when she drove. She probably does not sleep enough.  Her brother had epilepsy. Otherwise she had a normal birth and early  development.  There is no history of febrile convulsions, CNS infections such as meningitis/encephalitis, significant traumatic brain injury, neurosurgical procedures.  Diagnostic Data: 24-hour EEG in 03/2021 showed occasional left temporal slowing, no epileptiform discharges. Episodes of chest sensations and heavy breathing did not show any EEG correlate.   Neuropsychological testing in 08/2021 with current test scores did not indicate a diagnosis of a cognitive disorder. There was an isolated impairment across a line orientation task, which may be due to vision changes. Vascular issues may also be contributing. Testing did not lateralize to the left hemisphere. Psychiatric distress, various stressors, chronic pain, and sleep disturbances could also create inefficiencies.     Lab Results  Component Value Date   TSH 3.258 09/05/2020   No results found for: VITAMINB12  Lab Results  Component Value Date   HGBA1C 5.9 (H) 11/14/2020    PAST MEDICAL HISTORY: Past Medical History:  Diagnosis Date   Age-related osteoporosis without current pathological fracture 08/18/2020   Allergic rhinitis    Angina at rest [facility]) 02/09/2018   Arthritis    fingers, knees (02/09/2018)   Asthma    stopped taking RX after dr said I don't have this (02/09/2018)   Basal cell carcinoma    Bradycardia 02/09/2018   Carotid stenosis    ICA(L)   Chest pain due to coronary artery disease 02/09/2018   Chronic bronchitis (HCC)    Chronic constipation 08/18/2020   Complication of anesthesia    felt the cut w/one of my c-sections (02/09/2018)   Coronary artery disease involving native coronary artery of native heart without angina pectoris 09/17/2018   Diabetes mellitus type 2, controlled 03/31/2018   Eczema    EEG abnormal    Essential hypertension 03/31/2018   Generalized anxiety disorder    GERD (gastroesophageal reflux disease)    Hardening of the aorta (main artery of the heart)    Headache    years  ago; before menopause (02/09/2018)   Hirsutism 06/12/2017   Hyperglycemia due to type 2 diabetes mellitus 03/09/2021   Hyperlipidemia    Hyperpigmentation 02/13/2017   Hypertensive retinopathy    Irritable bowel syndrome    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   Mixed hyperlipidemia 12/29/2020   Myringotomy tube status 04/03/2021   Neck mass 01/17/2017   Neuropathic pain 03/09/2021   OA (osteoarthritis)    Osteoporosis    Palpitations 03/16/2021   Paresthesias 09/05/2020   Personal history of colonic polyps    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  May use 15-20 minutes prior to activity.). 6.7 g 1   alendronate (FOSAMAX) 70 MG tablet Take 70 mg by mouth every Saturday.     aspirin  EC 81 MG tablet Take 1 tablet (81 mg total) by mouth daily. Swallow whole. 90 tablet 3   Calcium  Citrate-Vitamin D  315-250 MG-UNIT TABS Take 1 tablet by mouth daily.     carboxymethylcellulose (REFRESH PLUS) 0.5 % SOLN Place 1 drop into both eyes in the morning and at bedtime.     Cholecalciferol  (VITAMIN D3) 10 MCG (400 UNIT) CAPS Take 400 Units by mouth daily.     Coenzyme Q10 (COQ10) 200 MG CAPS Take 200 mg by mouth daily.     cycloSPORINE  (RESTASIS ) 0.05 % ophthalmic emulsion Place 1 drop into both eyes 2 (two) times daily.      doxazosin  (CARDURA ) 1 MG tablet Take 1 mg by  mouth daily.     fluticasone  (FLONASE ) 50 MCG/ACT nasal spray Place 1 spray into both nostrils in the morning and at bedtime.     fluticasone -salmeterol (WIXELA INHUB) 100-50 MCG/ACT AEPB Inhale 1 puff into the lungs 2 (two) times daily.     Magnesium  250 MG TABS Take 250 mg by mouth daily.     Multiple Vitamins-Minerals (CENTRUM SILVER ) CHEW Chew 1 tablet by mouth daily.     nitroGLYCERIN  (NITROSTAT ) 0.4 MG SL tablet Place 0.4 mg under the tongue every 5 (five) minutes as needed for chest pain.     Olopatadine-Mometasone  (RYALTRIS ) 665-25 MCG/ACT SUSP Place 2 sprays into the nose 2 (two) times daily as needed (as needed for runny nose, post-nasal drip or congestion.). (Patient not taking: Reported on 10/16/2023) 29 g 4   Pitavastatin  Calcium  2 MG TABS TAKE 1 TABLET BY MOUTH EVERY DAY 90 tablet 7   polyethylene glycol (MIRALAX  / GLYCOLAX ) packet Take 17 g by mouth daily as needed for moderate constipation.     pyridOXINE  (VITAMIN B-6) 100 MG tablet Take 100 mg by mouth daily.      spironolactone  (ALDACTONE ) 50 MG tablet Take 50 mg by mouth daily.      No current facility-administered medications on file prior to visit.    ALLERGIES: Allergies  Allergen Reactions   Amlodipine  Besylate     Other reaction(s): rash and hives   Clonidine  Derivatives     Darkened skin/hair loss   Hydralazine      Other reaction(s): rash   Losartan  Potassium     Other reaction(s): rash and HIves   Minoxidil      Hair growth on chin   Naproxen Other (See Comments)    Patient preference  Other reaction(s): upset stomach   Prednisone Other (See Comments)    Patient Preference  Other reaction(s): upset stomach   Amlodipine  Hives   Isordil  [Isosorbide  Nitrate] Rash   Losartan  Rash    FAMILY HISTORY: Family History  Problem Relation Age of Onset   Diabetes Mellitus II Mother    Osteoporosis Mother    Hypertension Mother    Diabetes Mellitus II Father    CVA Sister    CAD Sister    Hypertension Sister     Osteoarthritis Sister    Alcoholism Brother    Epilepsy Brother    Breast cancer Cousin 42   Dementia Cousin        suspected; never confirmed   Allergic  rhinitis Neg Hx    Angioedema Neg Hx    Asthma Neg Hx    Eczema Neg Hx    Urticaria Neg Hx     SOCIAL HISTORY: Social History   Socioeconomic History   Marital status: Married    Spouse name: Milo   Number of children: 2   Years of education: 16   Highest education level: Bachelor's degree (e.g., BA, AB, BS)  Occupational History   Occupation: Retired  Tobacco Use   Smoking status: Never   Smokeless tobacco: Never  Vaping Use   Vaping status: Never Used  Substance and Sexual Activity   Alcohol  use: Never   Drug use: Never   Sexual activity: Not Currently  Other Topics Concern   Not on file  Social History Narrative   Consumes no caffeine   Right handed   One story house    Social Drivers of Health   Financial Resource Strain: Low Risk  (03/31/2018)   Overall Financial Resource Strain (CARDIA)    Difficulty of Paying Living Expenses: Not hard at all  Food Insecurity: No Food Insecurity (03/31/2018)   Hunger Vital Sign    Worried About Running Out of Food in the Last Year: Never true    Ran Out of Food in the Last Year: Never true  Transportation Needs: No Transportation Needs (03/31/2018)   PRAPARE - Administrator, Civil Service (Medical): No    Lack of Transportation (Non-Medical): No  Physical Activity: Insufficiently Active (03/31/2018)   Exercise Vital Sign    Days of Exercise per Week: 1 day    Minutes of Exercise per Session: 30 min  Stress: No Stress Concern Present (03/31/2018)   [employer] of Occupational Health - Occupational Stress Questionnaire    Feeling of Stress : Not at all  Social Connections: Moderately Integrated (03/31/2018)   Social Connection and Isolation Panel    Frequency of Communication with Friends and Family: More than three times a week    Frequency of  Social Gatherings with Friends and Family: More than three times a week    Attends Religious Services: 1 to 4 times per year    Active Member of [employer] or Organizations: No    Attends Banker Meetings: Never    Marital Status: Married  Catering manager Violence: Not At Risk (03/31/2018)   Humiliation, Afraid, Rape, and Kick questionnaire    Fear of Current or Ex-Partner: No    Emotionally Abused: No    Physically Abused: No    Sexually Abused: No     PHYSICAL EXAM: Vitals:   01/02/24 1141  BP: (!) 155/77  Pulse: 66  SpO2: 96%   General: No acute distress Head:  Normocephalic/atraumatic Skin/Extremities: No rash, no edema. Hyperpigmented skin changes on the soles of both feet.  Neurological Exam: alert and awake. No aphasia or dysarthria. Fund of knowledge is appropriate.  Attention and concentration are normal.   Cranial nerves: Pupils equal, round. Extraocular movements intact with no nystagmus. Visual fields full.  No facial asymmetry.  Motor: Bulk and tone normal, muscle strength 5/5 throughout with no pronator drift.Sensation intact to all modalities on both UE, decreased vibration sense to right knee, left ankle, intact pin and temperature. Reflexes +2 throughout except for absent ankle jerks. Finger to nose testing intact.  Gait narrow-based and steady, no ataxia.    IMPRESSION: This is a pleasant 83 yo RH woman with a history of hypertension, hyperlipidemia, diabetes, neuropathy, left carotid stenosis,  who presented in 02/2021 for second opinion regarding seizures. She was having recurrent intermittent episodes of paresthesias on one side of her body, initially reporting symptoms on the right side, but other times on the left. Her routine EEGs and her 24-hour EEG in 03/2021 showed occasional focal slowing over the left temporal region, no epileptiform discharges seen. Repeat brain MRIs have not shown any acute changes, no structural abnormality on the left temporal  region. Symptoms atypical for seizure, she has weaned off Levetiracetam  with no recurrence of symptoms. She presents today with new symptoms of different sensation on her feet, worse on the left. Exam shows mild length-dependent neuropathy. She was previously on Gabapentin  but is hesitant to restart, we agreed to monitor symptoms for now. Check B1, B12, folic acide. Follow-up in 6 months, call for any changes.    Thank you for allowing me to participate in her care.  Please do not hesitate to call for any questions or concerns.    Darice Shivers, M.D.   CC: Dr. Rexanne      Thank you for allowing me to participate in her care.  Please do not hesitate to call for any questions or concerns.    Darice Shivers, M.D.   CC: Dr. Rexanne, Dr. Tommas

## 2024-01-02 NOTE — Patient Instructions (Addendum)
 Good to see you.  Have bloodwork for B1, folic acid, B12 levels  2. If symptoms become unbearable, we can start a medication to quiet down irritated nerves  3. Follow-up in 6 months, call for any changes

## 2024-01-07 ENCOUNTER — Ambulatory Visit: Payer: Self-pay | Admitting: Neurology

## 2024-01-07 LAB — VITAMIN B1: Vitamin B1 (Thiamine): 11 nmol/L (ref 8–30)

## 2024-01-07 LAB — B12 AND FOLATE PANEL
Folate: 19.4 ng/mL
Vitamin B-12: 1766 pg/mL — ABNORMAL HIGH (ref 200–1100)

## 2024-01-08 DIAGNOSIS — E1169 Type 2 diabetes mellitus with other specified complication: Secondary | ICD-10-CM | POA: Diagnosis not present

## 2024-01-08 DIAGNOSIS — I700 Atherosclerosis of aorta: Secondary | ICD-10-CM | POA: Diagnosis not present

## 2024-01-08 DIAGNOSIS — E7800 Pure hypercholesterolemia, unspecified: Secondary | ICD-10-CM | POA: Diagnosis not present

## 2024-01-08 DIAGNOSIS — M169 Osteoarthritis of hip, unspecified: Secondary | ICD-10-CM | POA: Diagnosis not present

## 2024-01-08 DIAGNOSIS — F418 Other specified anxiety disorders: Secondary | ICD-10-CM | POA: Diagnosis not present

## 2024-01-08 DIAGNOSIS — K5900 Constipation, unspecified: Secondary | ICD-10-CM | POA: Diagnosis not present

## 2024-01-08 DIAGNOSIS — R634 Abnormal weight loss: Secondary | ICD-10-CM | POA: Diagnosis not present

## 2024-01-08 DIAGNOSIS — H35033 Hypertensive retinopathy, bilateral: Secondary | ICD-10-CM | POA: Diagnosis not present

## 2024-01-08 DIAGNOSIS — I10 Essential (primary) hypertension: Secondary | ICD-10-CM | POA: Diagnosis not present

## 2024-01-13 DIAGNOSIS — D485 Neoplasm of uncertain behavior of skin: Secondary | ICD-10-CM | POA: Diagnosis not present

## 2024-01-13 DIAGNOSIS — L2989 Other pruritus: Secondary | ICD-10-CM | POA: Diagnosis not present

## 2024-01-13 DIAGNOSIS — D1721 Benign lipomatous neoplasm of skin and subcutaneous tissue of right arm: Secondary | ICD-10-CM | POA: Diagnosis not present

## 2024-01-19 NOTE — Telephone Encounter (Signed)
 error

## 2024-01-21 DIAGNOSIS — I10 Essential (primary) hypertension: Secondary | ICD-10-CM | POA: Diagnosis not present

## 2024-01-21 DIAGNOSIS — H35033 Hypertensive retinopathy, bilateral: Secondary | ICD-10-CM | POA: Diagnosis not present

## 2024-01-21 DIAGNOSIS — E1169 Type 2 diabetes mellitus with other specified complication: Secondary | ICD-10-CM | POA: Diagnosis not present

## 2024-01-21 DIAGNOSIS — I700 Atherosclerosis of aorta: Secondary | ICD-10-CM | POA: Diagnosis not present

## 2024-01-27 DIAGNOSIS — Z23 Encounter for immunization: Secondary | ICD-10-CM | POA: Diagnosis not present

## 2024-01-29 DIAGNOSIS — K5900 Constipation, unspecified: Secondary | ICD-10-CM | POA: Diagnosis not present

## 2024-01-29 DIAGNOSIS — R634 Abnormal weight loss: Secondary | ICD-10-CM | POA: Diagnosis not present

## 2024-01-29 DIAGNOSIS — E782 Mixed hyperlipidemia: Secondary | ICD-10-CM | POA: Diagnosis not present

## 2024-01-29 DIAGNOSIS — Z681 Body mass index (BMI) 19 or less, adult: Secondary | ICD-10-CM | POA: Diagnosis not present

## 2024-01-29 DIAGNOSIS — Z1331 Encounter for screening for depression: Secondary | ICD-10-CM | POA: Diagnosis not present

## 2024-01-29 DIAGNOSIS — R0683 Snoring: Secondary | ICD-10-CM | POA: Diagnosis not present

## 2024-01-29 DIAGNOSIS — R636 Underweight: Secondary | ICD-10-CM | POA: Diagnosis not present

## 2024-01-29 DIAGNOSIS — I10 Essential (primary) hypertension: Secondary | ICD-10-CM | POA: Diagnosis not present

## 2024-01-29 DIAGNOSIS — Z8673 Personal history of transient ischemic attack (TIA), and cerebral infarction without residual deficits: Secondary | ICD-10-CM | POA: Diagnosis not present

## 2024-01-29 DIAGNOSIS — E1169 Type 2 diabetes mellitus with other specified complication: Secondary | ICD-10-CM | POA: Diagnosis not present

## 2024-02-03 ENCOUNTER — Other Ambulatory Visit: Payer: Self-pay

## 2024-02-03 ENCOUNTER — Emergency Department (HOSPITAL_BASED_OUTPATIENT_CLINIC_OR_DEPARTMENT_OTHER)
Admission: EM | Admit: 2024-02-03 | Discharge: 2024-02-03 | Disposition: A | Discharge status: Home / Self Care | Attending: Emergency Medicine | Admitting: Emergency Medicine

## 2024-02-03 DIAGNOSIS — N644 Mastodynia: Secondary | ICD-10-CM | POA: Insufficient documentation

## 2024-02-03 DIAGNOSIS — Z7982 Long term (current) use of aspirin: Secondary | ICD-10-CM | POA: Insufficient documentation

## 2024-02-03 MED ORDER — TRAMADOL HCL 50 MG PO TABS
50.0000 mg | ORAL_TABLET | Freq: Four times a day (QID) | ORAL | 0 refills | Status: DC | PRN
Start: 2024-02-03 — End: 2024-04-14

## 2024-02-03 NOTE — Discharge Instructions (Addendum)
 1.  At this time your heart and lung exam are normal.  Your breast does not have any obvious masses or areas of tenderness. 2.  Sometimes people experience severe sharp and or burning pains in their chest and back before they develop the rash of shingles.  Observe closely for any signs of a rash on your breast, side of your chest or back.  If you develop this, return for recheck immediately so you can start medication for shingles.  Treatment for shingles is best started within less than 48 hours of symptoms onset. 3.  You may take extra strength Tylenol  at home for pain if needed.  If you need additional pain control, you have been prescribed tramadol .  You may take 1 of these tablets with the Tylenol  every 6 hours. 4.  You report having had a mammogram in May.  If you are having atypical breast pains you may need additional breast studies such as a specialty breast ultrasound.  Discuss this with your doctor. 5.  If you feel that the pain is in your chest, that you are experiencing shortness of breath, lightheaded, racing heart or other concerning symptoms, return to the emergency department immediately.

## 2024-02-03 NOTE — ED Notes (Signed)
 Reviewed discharge instructions, medications, and home care with pt. Pt verbalized understanding and had no further questions. Pt exited ED without complications.

## 2024-02-03 NOTE — ED Notes (Signed)
 ED Provider at bedside.

## 2024-02-03 NOTE — ED Provider Notes (Signed)
 [place] [facility] DEPARTMENT AT [facility] Provider Note   CSN: [id number] Arrival date & time: 02/03/24  1358     Patient presents with: Breast Pain   Sara Brown is a 83 y.o. female.   HPI Patient reports that she started getting breast pains 3 days ago on Saturday.  They were coming out of the blue and just sudden severe sharp pains in her left breast.  She reports it was very much so in the breast and not in her chest.  There were no associated symptoms.  She did not experience any shortness of breath, lightheadedness, palpitations, fevers or chills.  She has not had any lower extremity swelling or calf pain.  Patient denies that it is associated with any movement or position change.  It comes on at rest and very suddenly.  She reports the pain then will resolve and go away.  She did not try anything for pain at the time.  Patient reports that she had a normal mammogram in May of this year.    Prior to Admission medications   Medication Sig Start Date End Date Taking? Authorizing Provider  traMADol  (ULTRAM ) 50 MG tablet Take 1 tablet (50 mg total) by mouth every 6 (six) hours as needed. 02/03/24  Yes Armenta Canning, MD  albuterol  (VENTOLIN  HFA) 108 (90 Base) MCG/ACT inhaler Inhale 2 puffs into the lungs every 4 (four) hours as needed for wheezing or shortness of breath (2 puffs every 4-6 hours as needed for cough/wheeze/shortness of breath/chest tightness.  May use 15-20 minutes prior to activity.). 04/17/23   Jeneal Danita Macintosh, MD  alendronate (FOSAMAX) 70 MG tablet Take 70 mg by mouth every Saturday. 08/09/18   [provider]  aspirin  EC 81 MG tablet Take 1 tablet (81 mg total) by mouth daily. Swallow whole. 01/07/22   Patwardhan, Newman PARAS, MD  Calcium  Citrate-Vitamin D  315-250 MG-UNIT TABS Take 1 tablet by mouth daily.    [provider]  carboxymethylcellulose (REFRESH PLUS) 0.5 % SOLN Place 1 drop into both eyes in the morning and at  bedtime.    [provider]  Cholecalciferol  (VITAMIN D3) 10 MCG (400 UNIT) CAPS Take 400 Units by mouth daily.    [provider]  Coenzyme Q10 (COQ10) 200 MG CAPS Take 200 mg by mouth daily.    [provider]  cycloSPORINE  (RESTASIS ) 0.05 % ophthalmic emulsion Place 1 drop into both eyes 2 (two) times daily.     [provider]  doxazosin  (CARDURA ) 1 MG tablet Take 1 mg by mouth daily.    [provider]  fluticasone  (FLONASE ) 50 MCG/ACT nasal spray Place 1 spray into both nostrils in the morning and at bedtime.    [provider]  fluticasone -salmeterol (WIXELA INHUB) 100-50 MCG/ACT AEPB Inhale 1 puff into the lungs 2 (two) times daily.    [provider]  Magnesium  250 MG TABS Take 250 mg by mouth daily.    [provider]  Multiple Vitamins-Minerals (CENTRUM SILVER ) CHEW Chew 1 tablet by mouth daily.    [provider]  nitroGLYCERIN  (NITROSTAT ) 0.4 MG SL tablet Place 0.4 mg under the tongue every 5 (five) minutes as needed for chest pain.    [provider]  Pitavastatin  Calcium  2 MG TABS TAKE 1 TABLET BY MOUTH EVERY DAY 05/22/23   Patwardhan, Manish J, MD  polyethylene glycol (MIRALAX  / GLYCOLAX ) packet Take 17 g by mouth daily as needed for moderate constipation.    [provider]  pyridOXINE  (VITAMIN B-6) 100 MG tablet Take 100 mg by mouth daily.     [provider]  spironolactone  (ALDACTONE ) 50 MG tablet Take 50 mg by mouth daily.     [provider]    Allergies: Amlodipine  besylate, Clonidine  derivatives, Hydralazine , Losartan  potassium, Minoxidil , Naproxen, Prednisone, Amlodipine , Isordil  [isosorbide  nitrate], and Losartan     Review of Systems  Updated Vital Signs BP (!) 137/94 (BP Location: Right Arm)   Pulse 73   Temp (!) 97.5 F (36.4 C)   Resp 18   SpO2 97%   Physical Exam Constitutional:      Comments: Alert nontoxic no respiratory distress.  Normal  mental status.  Good physical condition.  Eyes:     Extraocular Movements: Extraocular movements intact.  Cardiovascular:     Rate and Rhythm: Normal rate and regular rhythm.     Heart sounds: Normal heart sounds.  Pulmonary:     Effort: Pulmonary effort is normal.     Breath sounds: Normal breath sounds.     Comments: No chest wall tenderness to compression of the ribs or anterior lateral chest.  Patient's breasts are symmetric.  She has had significant weight loss over the years and breasts are pendulous but normal appearance without any erythema or rashes.  No significant palpable masses within the breast soft tissue.  Nipple normal in appearance without drainage or discharge.  Left axilla normal with no lymphadenopathy. Chest:     Chest wall: No tenderness.  Abdominal:     General: There is no distension.     Palpations: Abdomen is soft.     Tenderness: There is no abdominal tenderness. There is no guarding.  Musculoskeletal:        General: No swelling or tenderness. Normal range of motion.     Right lower leg: No edema.     Left lower leg: No edema.     Comments: Normal range of motion of left upper extremity with no pain.  Bilateral lower extremities no calf swelling or pain  Skin:    General: Skin is warm and dry.  Neurological:     General: No focal deficit present.     Mental Status: She is oriented to person, place, and time.     Motor: No weakness.     Coordination: Coordination normal.  Psychiatric:        Mood and Affect: Mood normal.     (all labs ordered are listed, but only abnormal results are displayed) Labs Reviewed - No data to display  EKG: None  Radiology: No results found.   Procedures   Medications Ordered in the ED - No data to display                                  Medical Decision Making  Patient has sporadic sharp pain occurring in the left breast over the past 3 days with no precipitating event.  Physical exam is normal.  Patient has  had a mammogram within the past 3 months.  At this time I discussed the possibility of early shingles with pain without evident rash.  Patient will watch closely for any development of rash.  I also discussed the possibility of subsequent need for breast ultrasound or more breast imaging.  Patient is to discuss this with her PCP.  Currently there are no signs or symptoms to suggest that this is a cardiac or  pulmonary etiology.  Patient is carefully counseled to return if she should have any signs of shortness of breath, chest pain that is not in the breast tissue itself, palpitations or lightheadedness.  Patient voices understanding.     Final diagnoses:  Breast pain, left    ED Discharge Orders          Ordered    traMADol  (ULTRAM ) 50 MG tablet  Every 6 hours PRN        02/03/24 1530               Armenta Canning, MD 02/03/24 1536

## 2024-02-03 NOTE — ED Triage Notes (Signed)
 Patient states left breast pain for the past several days. Denies swelling or drainage.

## 2024-02-04 ENCOUNTER — Encounter: Payer: Self-pay | Admitting: Podiatry

## 2024-02-04 ENCOUNTER — Ambulatory Visit (INDEPENDENT_AMBULATORY_CARE_PROVIDER_SITE_OTHER): Admitting: Podiatry

## 2024-02-04 DIAGNOSIS — M79675 Pain in left toe(s): Secondary | ICD-10-CM | POA: Diagnosis not present

## 2024-02-04 DIAGNOSIS — M79674 Pain in right toe(s): Secondary | ICD-10-CM

## 2024-02-04 DIAGNOSIS — E1142 Type 2 diabetes mellitus with diabetic polyneuropathy: Secondary | ICD-10-CM | POA: Diagnosis not present

## 2024-02-04 DIAGNOSIS — B351 Tinea unguium: Secondary | ICD-10-CM | POA: Diagnosis not present

## 2024-02-04 NOTE — Progress Notes (Signed)
This patient returns to my office for at risk foot care.  This patient requires this care by a professional since this patient will be at risk due to having diabetic neuropathy.  This patient is unable to cut nails herself since the patient cannot reach her nails.These nails are painful walking and wearing shoes.  This patient presents for at risk foot care today.  General Appearance  Alert, conversant and in no acute stress.  Vascular  Dorsalis pedis and posterior tibial  pulses are palpable  bilaterally.  Capillary return is within normal limits  bilaterally. Temperature is within normal limits  bilaterally.  Neurologic  Senn-Weinstein monofilament wire test within normal limits  bilaterally. Muscle power within normal limits bilaterally.  Nails Thick disfigured discolored nails with subungual debris  from hallux to fifth toes bilaterally. No evidence of bacterial infection or drainage bilaterally.  Orthopedic  No limitations of motion  feet .  No crepitus or effusions noted.  No bony pathology or digital deformities noted.  Skin  normotropic skin with no porokeratosis noted bilaterally.  No signs of infections or ulcers noted.     Onychomycosis  Pain in right toes  Pain in left toes  Consent was obtained for treatment procedures.   Mechanical debridement of nails 1-5  bilaterally performed with a nail nipper.  Filed with dremel without incident.     Return office visit   3 months                   Told patient to return for periodic foot care and evaluation due to potential at risk complications.   Collen Vincent DPM   

## 2024-02-05 ENCOUNTER — Ambulatory Visit: Admitting: Podiatry

## 2024-02-05 DIAGNOSIS — E119 Type 2 diabetes mellitus without complications: Secondary | ICD-10-CM | POA: Diagnosis not present

## 2024-02-05 DIAGNOSIS — H40023 Open angle with borderline findings, high risk, bilateral: Secondary | ICD-10-CM | POA: Diagnosis not present

## 2024-02-05 DIAGNOSIS — H04123 Dry eye syndrome of bilateral lacrimal glands: Secondary | ICD-10-CM | POA: Diagnosis not present

## 2024-02-06 ENCOUNTER — Other Ambulatory Visit: Payer: Self-pay | Admitting: Obstetrics and Gynecology

## 2024-02-06 DIAGNOSIS — N644 Mastodynia: Secondary | ICD-10-CM

## 2024-02-08 DIAGNOSIS — I700 Atherosclerosis of aorta: Secondary | ICD-10-CM | POA: Diagnosis not present

## 2024-02-08 DIAGNOSIS — F418 Other specified anxiety disorders: Secondary | ICD-10-CM | POA: Diagnosis not present

## 2024-02-08 DIAGNOSIS — E7800 Pure hypercholesterolemia, unspecified: Secondary | ICD-10-CM | POA: Diagnosis not present

## 2024-02-08 DIAGNOSIS — I10 Essential (primary) hypertension: Secondary | ICD-10-CM | POA: Diagnosis not present

## 2024-02-08 DIAGNOSIS — H35033 Hypertensive retinopathy, bilateral: Secondary | ICD-10-CM | POA: Diagnosis not present

## 2024-02-08 DIAGNOSIS — M169 Osteoarthritis of hip, unspecified: Secondary | ICD-10-CM | POA: Diagnosis not present

## 2024-02-08 DIAGNOSIS — E1169 Type 2 diabetes mellitus with other specified complication: Secondary | ICD-10-CM | POA: Diagnosis not present

## 2024-02-12 DIAGNOSIS — R634 Abnormal weight loss: Secondary | ICD-10-CM | POA: Diagnosis not present

## 2024-02-12 DIAGNOSIS — K5900 Constipation, unspecified: Secondary | ICD-10-CM | POA: Diagnosis not present

## 2024-02-12 DIAGNOSIS — I10 Essential (primary) hypertension: Secondary | ICD-10-CM | POA: Diagnosis not present

## 2024-02-12 DIAGNOSIS — Z8673 Personal history of transient ischemic attack (TIA), and cerebral infarction without residual deficits: Secondary | ICD-10-CM | POA: Diagnosis not present

## 2024-02-12 DIAGNOSIS — E1169 Type 2 diabetes mellitus with other specified complication: Secondary | ICD-10-CM | POA: Diagnosis not present

## 2024-02-12 DIAGNOSIS — R636 Underweight: Secondary | ICD-10-CM | POA: Diagnosis not present

## 2024-02-12 DIAGNOSIS — Z681 Body mass index (BMI) 19 or less, adult: Secondary | ICD-10-CM | POA: Diagnosis not present

## 2024-02-12 DIAGNOSIS — E782 Mixed hyperlipidemia: Secondary | ICD-10-CM | POA: Diagnosis not present

## 2024-02-13 ENCOUNTER — Ambulatory Visit: Attending: Cardiology | Admitting: Cardiology

## 2024-02-13 ENCOUNTER — Encounter: Payer: Self-pay | Admitting: Cardiology

## 2024-02-13 ENCOUNTER — Ambulatory Visit

## 2024-02-13 ENCOUNTER — Ambulatory Visit
Admission: RE | Admit: 2024-02-13 | Discharge: 2024-02-13 | Disposition: A | Discharge status: Home / Self Care | Source: Ambulatory Visit | Attending: Obstetrics and Gynecology | Admitting: Obstetrics and Gynecology

## 2024-02-13 VITALS — BP 124/70 | HR 64 | Resp 16 | Ht 65.0 in | Wt 115.4 lb

## 2024-02-13 DIAGNOSIS — I10 Essential (primary) hypertension: Secondary | ICD-10-CM | POA: Insufficient documentation

## 2024-02-13 DIAGNOSIS — N644 Mastodynia: Secondary | ICD-10-CM

## 2024-02-13 NOTE — Addendum Note (Signed)
 Addended by: ELMIRA NEWMAN PARAS on: 02/13/2024 02:42 PM   Modules accepted: Orders

## 2024-02-13 NOTE — Progress Notes (Signed)
  Cardiology Office Note:  .   Date:  02/13/2024  ID:  Sara Brown, DOB 07/07/1940, MRN [id number] PCP: Sara Ingle, Brown  Sara Brown Providers Cardiologist:  Sara Brown PCP: Brown Rexanne, Brown  History of Present Illness: .    83 y/o female with hypertension, diet controlled diabetes, hyperlipidemia,  GERD and remote history of TIA, mild nonobstructive coronary artery disease   Patient is doing well.  She has noticed some low blood pressure readings especially early in the morning.  She denies any complaints of chest pain, shortness of breath.   Patient has been intolerant to several medications-including but not limited to losartan , amlodipine , hydralazine , atenolol, nifedipine , clonidine , reluctant to use carvedilol  and labetalol  due to history of asthma.    Today's Vitals   02/13/24 0925  BP: 124/70  Pulse: 64  Resp: 16  SpO2: 96%  Weight: 115 lb 6.4 oz (52.3 kg)  Height: 5' 5 (1.651 m)   Body mass index is 19.2 kg/m.   ROS:  Review of Systems  Cardiovascular:  Negative for chest pain, dyspnea on exertion, leg swelling, palpitations and syncope.     Studies Reviewed: [name]       EKG 08/12/2023: Sinus rhythm 72 bpm Left axis deviation Nonspecific T wave abnormality  Exercise nuclear stress test 11/25/2022 (independently interpreted by me): 7.0 METS  Small size, mild intensity, reversible perfusion defect in inferior myocardium.    Physical Exam:   Physical Exam Vitals and nursing note reviewed.  Constitutional:      General: She is not in acute distress. Neck:     Vascular: No JVD.  Cardiovascular:     Rate and Rhythm: Normal rate and regular rhythm.     Heart sounds: Normal heart sounds. No murmur heard. Pulmonary:     Effort: Pulmonary effort is normal.     Breath sounds: Normal breath sounds. No wheezing or rales.  Musculoskeletal:     Right lower leg: No edema.     Left lower leg: No edema.      ICD-10-CM   1. Primary  hypertension  I10         ASSESSMENT AND PLAN: .    83 y/o female with hypertension, diet controlled diabetes, hyperlipidemia,  GERD and remote history of TIA, mild nonobstructive coronary artery disease    Hypertension: Well-controlled, with some low numbers in the morning.  Reduce spironolactone  from 50 mg daily to 25 mg daily.  Continue doxazosin  1 mg daily.  Patient has been intolerant to several medications-including but not limited to losartan , amlodipine , hydralazine , atenolol, nifedipine , clonidine , reluctant to use carvedilol  and labetalol  due to history of asthma.   Coronary artery disease: Nonobstructive on cath in 2019. Stress test in 11/2022 showed mild inferior ischemia.  In absence of anginal symptoms, continue medical management.   Intolerant to several statins, takes between statin every other day. Not keen on injectable agents.     F/u in 3 with Pharm.CHARM Sara Brown, as requested by the patient. I will see her as needed  Signed, Sara Sara Lawrence, Brown

## 2024-02-13 NOTE — Patient Instructions (Signed)
 Medication Instructions:  REDUCE Spironolactone  to 25 mg *If you need a refill on your cardiac medications before your next appointment, please call your pharmacy*  Lab Work: NONE If you have labs (blood work) drawn today and your tests are completely normal, you will receive your results only by: MyChart Message (if you have MyChart) OR A paper copy in the mail If you have any lab test that is abnormal or we need to change your treatment, we will call you to review the results.  Testing/Procedures: NONE  Follow-Up: At [facility], you and your health needs are our priority.  As part of our continuing mission to provide you with exceptional heart care, our providers are all part of one team.  This team includes your primary Cardiologist (physician) and Advanced Practice Providers or APPs (Physician Assistants and Nurse Practitioners) who all work together to provide you with the care you need, when you need it.  Your next appointment:   3 Months with PHARM D - Robbi Blanch  THEN   AS NEEDED with Dr. [place]

## 2024-02-20 DIAGNOSIS — I10 Essential (primary) hypertension: Secondary | ICD-10-CM | POA: Diagnosis not present

## 2024-02-20 DIAGNOSIS — H35033 Hypertensive retinopathy, bilateral: Secondary | ICD-10-CM | POA: Diagnosis not present

## 2024-02-20 DIAGNOSIS — I700 Atherosclerosis of aorta: Secondary | ICD-10-CM | POA: Diagnosis not present

## 2024-02-20 DIAGNOSIS — E1169 Type 2 diabetes mellitus with other specified complication: Secondary | ICD-10-CM | POA: Diagnosis not present

## 2024-03-03 ENCOUNTER — Emergency Department (HOSPITAL_BASED_OUTPATIENT_CLINIC_OR_DEPARTMENT_OTHER)

## 2024-03-03 ENCOUNTER — Other Ambulatory Visit: Payer: Self-pay

## 2024-03-03 ENCOUNTER — Emergency Department (HOSPITAL_BASED_OUTPATIENT_CLINIC_OR_DEPARTMENT_OTHER)
Admission: EM | Admit: 2024-03-03 | Discharge: 2024-03-03 | Disposition: A | Discharge status: Home / Self Care | Attending: Emergency Medicine | Admitting: Emergency Medicine

## 2024-03-03 ENCOUNTER — Encounter (HOSPITAL_BASED_OUTPATIENT_CLINIC_OR_DEPARTMENT_OTHER): Payer: Self-pay

## 2024-03-03 DIAGNOSIS — Z79899 Other long term (current) drug therapy: Secondary | ICD-10-CM | POA: Diagnosis not present

## 2024-03-03 DIAGNOSIS — M79661 Pain in right lower leg: Secondary | ICD-10-CM | POA: Diagnosis present

## 2024-03-03 DIAGNOSIS — E119 Type 2 diabetes mellitus without complications: Secondary | ICD-10-CM | POA: Diagnosis not present

## 2024-03-03 DIAGNOSIS — Z7982 Long term (current) use of aspirin: Secondary | ICD-10-CM | POA: Diagnosis not present

## 2024-03-03 DIAGNOSIS — I82811 Embolism and thrombosis of superficial veins of right lower extremities: Secondary | ICD-10-CM | POA: Insufficient documentation

## 2024-03-03 DIAGNOSIS — I2510 Atherosclerotic heart disease of native coronary artery without angina pectoris: Secondary | ICD-10-CM | POA: Insufficient documentation

## 2024-03-03 DIAGNOSIS — I10 Essential (primary) hypertension: Secondary | ICD-10-CM | POA: Diagnosis not present

## 2024-03-03 LAB — COMPREHENSIVE METABOLIC PANEL WITH GFR
ALT: 25 U/L (ref 0–44)
AST: 32 U/L (ref 15–41)
Albumin: 4.5 g/dL (ref 3.5–5.0)
Alkaline Phosphatase: 67 U/L (ref 38–126)
Anion gap: 13 (ref 5–15)
BUN: 27 mg/dL — ABNORMAL HIGH (ref 8–23)
CO2: 22 mmol/L (ref 22–32)
Calcium: 9.9 mg/dL (ref 8.9–10.3)
Chloride: 102 mmol/L (ref 98–111)
Creatinine, Ser: 0.85 mg/dL (ref 0.44–1.00)
GFR, Estimated: >60 mL/min (ref >60)
Glucose, Bld: 110 mg/dL — ABNORMAL HIGH (ref 70–99)
Potassium: 4.5 mmol/L (ref 3.5–5.1)
Sodium: 137 mmol/L (ref 135–145)
Total Bilirubin: 1 mg/dL (ref 0.0–1.2)
Total Protein: 7.4 g/dL (ref 6.5–8.1)

## 2024-03-03 LAB — CBC WITH DIFFERENTIAL/PLATELET
Abs Immature Granulocytes: 0.01 K/uL (ref 0.00–0.07)
Basophils Absolute: 0 K/uL (ref 0.0–0.1)
Basophils Relative: 0 %
Eosinophils Absolute: 0.1 K/uL (ref 0.0–0.5)
Eosinophils Relative: 2 %
HCT: 36.8 % (ref 36.0–46.0)
Hemoglobin: 12.2 g/dL (ref 12.0–15.0)
Immature Granulocytes: 0 %
Lymphocytes Relative: 21 %
Lymphs Abs: 1.1 K/uL (ref 0.7–4.0)
MCH: 30.1 pg (ref 26.0–34.0)
MCHC: 33.2 g/dL (ref 30.0–36.0)
MCV: 90.9 fL (ref 80.0–100.0)
Monocytes Absolute: 0.4 K/uL (ref 0.1–1.0)
Monocytes Relative: 8 %
Neutro Abs: 3.4 K/uL (ref 1.7–7.7)
Neutrophils Relative %: 69 %
Platelets: 169 K/uL (ref 150–400)
RBC: 4.05 MIL/uL (ref 3.87–5.11)
RDW: 14.3 % (ref 11.5–15.5)
WBC: 5.1 K/uL (ref 4.0–10.5)
nRBC: 0 % (ref 0.0–0.2)

## 2024-03-03 MED ORDER — ENOXAPARIN SODIUM 60 MG/0.6ML IJ SOSY
50.0000 mg | PREFILLED_SYRINGE | Freq: Once | INTRAMUSCULAR | Status: AC
  Administered 2024-03-03: 50 mg via SUBCUTANEOUS
  Filled 2024-03-03: qty 0.6

## 2024-03-03 NOTE — ED Provider Notes (Signed)
 [place] EMERGENCY DEPARTMENT AT [facility] Provider Note   CSN: [id number] Arrival date & time: 03/03/24  1607     Patient presents with: Calf pain   Sara Brown is a 83 y.o. female.   Patient here with right calf pain that started this morning.  Denies any recent surgery or travel.  No blood clot history.  Denies any trauma.  Denies any falls.  History of high cholesterol irritable bowel syndrome diabetes.  The history is provided by the patient.       Prior to Admission medications   Medication Sig Start Date End Date Taking? Authorizing Provider  albuterol  (VENTOLIN  HFA) 108 (90 Base) MCG/ACT inhaler Inhale 2 puffs into the lungs every 4 (four) hours as needed for wheezing or shortness of breath (2 puffs every 4-6 hours as needed for cough/wheeze/shortness of breath/chest tightness.  May use 15-20 minutes prior to activity.). 04/17/23   Jeneal Danita Macintosh, MD  aspirin  EC 81 MG tablet Take 1 tablet (81 mg total) by mouth daily. Swallow whole. 01/07/22   Patwardhan, Newman PARAS, MD  Calcium  Citrate-Vitamin D  315-250 MG-UNIT TABS Take 1 tablet by mouth daily.    [provider]  carboxymethylcellulose (REFRESH PLUS) 0.5 % SOLN Place 1 drop into both eyes in the morning and at bedtime.    [provider]  Cholecalciferol  (VITAMIN D3) 10 MCG (400 UNIT) CAPS Take 400 Units by mouth daily.    [provider]  Coenzyme Q10 (COQ10) 200 MG CAPS Take 200 mg by mouth daily.    [provider]  cycloSPORINE  (RESTASIS ) 0.05 % ophthalmic emulsion Place 1 drop into both eyes 2 (two) times daily.     [provider]  doxazosin  (CARDURA ) 1 MG tablet Take 1 mg by mouth daily.    [provider]  fluticasone  (FLONASE ) 50 MCG/ACT nasal spray Place 1 spray into both nostrils in the morning and at bedtime.    [provider]  fluticasone -salmeterol (WIXELA INHUB) 100-50 MCG/ACT AEPB Inhale 1 puff into the lungs 2  (two) times daily.    [provider]  Magnesium  250 MG TABS Take 250 mg by mouth daily.    [provider]  Multiple Vitamins-Minerals (CENTRUM SILVER ) CHEW Chew 1 tablet by mouth daily.    [provider]  nitroGLYCERIN  (NITROSTAT ) 0.4 MG SL tablet Place 0.4 mg under the tongue every 5 (five) minutes as needed for chest pain.    [provider]  Pitavastatin  Calcium  2 MG TABS TAKE 1 TABLET BY MOUTH EVERY DAY 05/22/23   Patwardhan, Manish J, MD  polyethylene glycol (MIRALAX  / GLYCOLAX ) packet Take 17 g by mouth daily as needed for moderate constipation.    [provider]  pyridOXINE  (VITAMIN B-6) 100 MG tablet Take 100 mg by mouth daily.     [provider]  spironolactone  (ALDACTONE ) 50 MG tablet Take 25 mg by mouth daily.    [provider]  traMADol  (ULTRAM ) 50 MG tablet Take 1 tablet (50 mg total) by mouth every 6 (six) hours as needed. 02/03/24   Armenta Canning, MD    Allergies: Amlodipine  besylate, Clonidine  derivatives, Hydralazine , Losartan  potassium, Minoxidil , Naproxen, Prednisone, Amlodipine , Isordil  [isosorbide  nitrate], and Losartan     Review of Systems  Updated Vital Signs BP (!) 156/89 (BP Location: Right Arm)   Pulse 70   Temp 98 F (36.7 C) (Oral)   Resp 16   Ht 5' 5 (1.651 m)   Wt 53 kg  SpO2 98%   BMI 19.44 kg/m   Physical Exam Vitals and nursing note reviewed.  Constitutional:      General: She is not in acute distress.    Appearance: She is well-developed. She is not ill-appearing.  HENT:     Head: Normocephalic and atraumatic.     Nose: Nose normal.     Mouth/Throat:     Mouth: Mucous membranes are moist.  Eyes:     Extraocular Movements: Extraocular movements intact.     Conjunctiva/sclera: Conjunctivae normal.     Pupils: Pupils are equal, round, and reactive to light.  Cardiovascular:     Rate and Rhythm: Normal rate and regular rhythm.     Pulses: Normal pulses.     Heart sounds:  Normal heart sounds. No murmur heard. Pulmonary:     Effort: Pulmonary effort is normal. No respiratory distress.     Breath sounds: Normal breath sounds.  Abdominal:     General: Abdomen is flat.     Palpations: Abdomen is soft.     Tenderness: There is no abdominal tenderness.  Musculoskeletal:        General: Tenderness present. No swelling. Normal range of motion.     Cervical back: Normal range of motion and neck supple.     Comments: Tenderness to the right calf with no major swelling  Skin:    General: Skin is warm and dry.     Capillary Refill: Capillary refill takes less than 2 seconds.  Neurological:     General: No focal deficit present.     Mental Status: She is alert and oriented to person, place, and time.     Cranial Nerves: No cranial nerve deficit.     Sensory: No sensory deficit.     Motor: No weakness.     Coordination: Coordination normal.  Psychiatric:        Mood and Affect: Mood normal.     (all labs ordered are listed, but only abnormal results are displayed) Labs Reviewed  COMPREHENSIVE METABOLIC PANEL WITH GFR - Abnormal; Notable for the following components:      Result Value   Glucose, Bld 110 (*)    BUN 27 (*)    All other components within normal limits  CBC WITH DIFFERENTIAL/PLATELET    EKG: None  Radiology: US  Venous Img Lower Unilateral Right (DVT) Result Date: 03/03/2024 CLINICAL DATA:  [id number] Right calf pain [id number] EXAM: RIGHT LOWER EXTREMITY VENOUS DOPPLER ULTRASOUND TECHNIQUE: Gray-scale sonography with graded compression, as well as color Doppler and duplex ultrasound were performed to evaluate the lower extremity deep venous systems from the level of the common femoral vein and including the common femoral, femoral, profunda femoral, popliteal and calf veins including the posterior tibial, peroneal and gastrocnemius veins when visible. The superficial great saphenous vein was also interrogated. Spectral Doppler was utilized to  evaluate flow at rest and with distal augmentation maneuvers in the common femoral, femoral and popliteal veins. COMPARISON:  None Available. FINDINGS: Contralateral Common Femoral Vein: Respiratory phasicity is normal and symmetric with the symptomatic side. No evidence of thrombus. Normal compressibility. Common Femoral Vein: No evidence of thrombus. Normal compressibility, respiratory phasicity and response to augmentation. Saphenofemoral Junction: No evidence of thrombus. Normal compressibility and flow on color Doppler imaging. Profunda Femoral Vein: No evidence of thrombus. Normal compressibility and flow on color Doppler imaging. Femoral Vein: No evidence of thrombus. Normal compressibility, respiratory phasicity and response to augmentation. Popliteal Vein: No evidence of thrombus. Normal compressibility, respiratory  phasicity and response to augmentation. Calf Veins: No evidence of thrombus. Normal compressibility and flow on color Doppler imaging. Superficial Great Saphenous Vein: No evidence of thrombus. Normal compressibility. Other Findings:  Noncompressible, thrombosed lesser saphenous vein. IMPRESSION: Occlusive superficial venous thrombosis in the lesser saphenous vein. Otherwise, negative for deep venous thrombosis in the right leg. Electronically Signed   By: Rogelia Myers M.D.   On: 03/03/2024 19:17     Procedures   Medications Ordered in the ED  enoxaparin  (LOVENOX ) injection 50 mg (50 mg Subcutaneous Given 03/03/24 1947)                                    Medical Decision Making Amount and/or Complexity of Data Reviewed Labs: ordered.  Risk Prescription drug management.   Ebonie Westerlund [facility] is here with pain to the right calf.  History of hypertension and high cholesterol CAD.  Overall vitals are unremarkable.  She neurovascular muscle intact on exam.  Will get basic labs and get a DVT study.  She denies any trauma.  Ultrasound shows superficial thrombosis and lesser  saphenous vein.  Lab work was unremarkable.  I talked with Dr. Magda with vascular surgery and he recommended a shot of Lovenox  here and will follow-up in the DVT clinic tomorrow to discuss if they will continue anticoagulation or not.  Patient understands this plan.  She was educated about anticoagulation.  She is given return precautions.  Patient discharged.  Neurovascular neuromuscular intact on exam.  This chart was dictated using voice recognition software.  Despite best efforts to proofread,  errors can occur which can change the documentation meaning.      Final diagnoses:  Superficial thrombosis of leg, right    ED Discharge Orders          Ordered    AMB Referral to Deep Vein Thrombosis Clinic        03/03/24 1934               Ruthe Cornet, DO 03/03/24 1954

## 2024-03-03 NOTE — ED Triage Notes (Signed)
 Pt c/o left calf pain started early this am.   States she has never had this type of pain before No injury

## 2024-03-03 NOTE — Discharge Instructions (Signed)
 You have been treated with a shot of anticoagulation for your superficial blood clot in your leg.  Please return if you have any bleeding or have any significant trauma especially any head trauma.  Follow-up in DVT clinic tomorrow.  They should call you with an appointment.

## 2024-03-04 ENCOUNTER — Telehealth: Payer: Self-pay | Admitting: Vascular Surgery

## 2024-03-04 ENCOUNTER — Other Ambulatory Visit (HOSPITAL_COMMUNITY): Payer: Self-pay

## 2024-03-04 ENCOUNTER — Ambulatory Visit: Attending: Vascular Surgery | Admitting: Pharmacist

## 2024-03-04 VITALS — BP 135/72 | HR 54 | Wt 117.1 lb

## 2024-03-04 DIAGNOSIS — I10 Essential (primary) hypertension: Secondary | ICD-10-CM | POA: Diagnosis not present

## 2024-03-04 DIAGNOSIS — E1169 Type 2 diabetes mellitus with other specified complication: Secondary | ICD-10-CM | POA: Diagnosis not present

## 2024-03-04 DIAGNOSIS — R636 Underweight: Secondary | ICD-10-CM | POA: Diagnosis not present

## 2024-03-04 DIAGNOSIS — I82890 Acute embolism and thrombosis of other specified veins: Secondary | ICD-10-CM | POA: Diagnosis not present

## 2024-03-04 DIAGNOSIS — R634 Abnormal weight loss: Secondary | ICD-10-CM | POA: Diagnosis not present

## 2024-03-04 DIAGNOSIS — Z681 Body mass index (BMI) 19 or less, adult: Secondary | ICD-10-CM | POA: Diagnosis not present

## 2024-03-04 DIAGNOSIS — Z8673 Personal history of transient ischemic attack (TIA), and cerebral infarction without residual deficits: Secondary | ICD-10-CM | POA: Diagnosis not present

## 2024-03-04 DIAGNOSIS — K5900 Constipation, unspecified: Secondary | ICD-10-CM | POA: Diagnosis not present

## 2024-03-04 DIAGNOSIS — E782 Mixed hyperlipidemia: Secondary | ICD-10-CM | POA: Diagnosis not present

## 2024-03-04 MED ORDER — APIXABAN 5 MG PO TABS: 5.0000 mg | ORAL_TABLET | Freq: Two times a day (BID) | ORAL | 0 refills | Status: DC

## 2024-03-04 MED ORDER — ELIQUIS DVT/PE STARTER PACK 5 MG PO TBPK
ORAL_TABLET | ORAL | 0 refills | Status: DC
  Filled 2024-03-04: qty 74, 30d supply, fill #0

## 2024-03-04 NOTE — Progress Notes (Cosign Needed)
 [facility] Note  Name: Sara Brown     MRN: [id number]     DOB: 01/10/1941     Sex: female  PCP: Rexanne Ingle, MD  Today's Visit: Visit Information: Initial Visit  Referred to DVT Clinic by: Emergency Department - Dr. Juliene Bicker Referred to CPP by: Dr. Lanis Reason for referral:  Chief Complaint  Patient presents with   superficial vein thrombosis   HISTORY OF PRESENT ILLNESS: [name] is a 83 y.o. female with PMH TIA, CAD, HTN, T2DM, HLD, IBS, who presents after diagnosis of DVT for medication management. Seen yesterday evening in the ED for right calf pain. Ultrasound showed occlusive superficial vein thrombosis in the right small saphenous vein. ED discussed the patient with Dr. Magda who recommended giving the patient a dose of Lovenox  and referring them to DVT Clinic to follow up the next day. Per Dr. Magda plan to treat with anticoagulation for 45 days.  Patient presents to clinic today ambulating independently accompanied by her husband. Reports she woke up Tuesday night not feeling well and felt a sharp pain in her right leg. States the sharp pain continues to come and go. Denies swelling, SOB, chest pain, dizziness. Denies any recent injuries to the leg, prolonged travel or sedentary periods. Reports she has not been feeling well lately, but attributes this to BP being more elevated.   Positive Thrombotic Risk Factors: Older Age Bleeding Risk Factors: Age >65 years, Antiplatelet therapy  Negative Thrombotic Risk Factors: Previous VTE, Recent surgery (within 3 months), Recent trauma (within 3 months), Recent admission to hospital with acute illness (within 3 months), Paralysis, paresis, or recent plaster cast immobilization of lower extremity, Central venous catheterization, Pregnancy, Sedentary journey lasting >8 hours within 4 weeks, Bed rest >72 hours within 3 months, Within 6 weeks postpartum, Recent cesarean section (within 3 months), Estrogen  therapy, Recent COVID diagnosis (within 3 months), Erythropoiesis-stimulating agent, Testosterone therapy, Active cancer, Non-malignant, chronic inflammatory condition, Known thrombophilic condition, Smoking, Obesity  Rx Insurance Coverage: Medicare Rx Affordability: Eliquis  is $200 per 1 month supply, Eliquis  is $99 for a 15 day supply. Rx Assistance Provided: Free 30-day trial card Medication samples Preferred Pharmacy: Initial fill at [facility] during appointment. Medication samples provided for her to complete remaining 2 weeks of treatment as the cost of Eliquis  with her insurance is unaffordable for her.  Past Medical History:  Diagnosis Date   Age-related osteoporosis without current pathological fracture 08/18/2020   Allergic rhinitis    Angina at rest 02/09/2018   Arthritis    fingers, knees (02/09/2018)   Asthma    stopped taking RX after dr said I don't have this (02/09/2018)   Basal cell carcinoma    Bradycardia 02/09/2018   Carotid stenosis    ICA(L)   Chest pain due to coronary artery disease 02/09/2018   Chronic bronchitis (HCC)    Chronic constipation 08/18/2020   Complication of anesthesia    felt the cut w/one of my c-sections (02/09/2018)   Coronary artery disease involving native coronary artery of native heart without angina pectoris 09/17/2018   Diabetes mellitus type 2, controlled 03/31/2018   Eczema    EEG abnormal    Essential hypertension 03/31/2018   Generalized anxiety disorder    GERD (gastroesophageal reflux disease)    Hardening of the aorta (main artery of the heart)    Headache    years ago; before menopause (02/09/2018)   Hirsutism 06/12/2017   Hyperglycemia due to type  2 diabetes mellitus 03/09/2021   Hyperlipidemia    Hyperpigmentation 02/13/2017   Hypertensive retinopathy    Irritable bowel syndrome    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   Mixed conductive and sensorineural hearing loss of left ear with restricted  hearing of right ear 01/24/2021   Mixed hyperlipidemia 12/29/2020   Myringotomy tube status 04/03/2021   Neck mass 01/17/2017   Neuropathic pain 03/09/2021   OA (osteoarthritis)    Osteoporosis    Palpitations 03/16/2021   Paresthesias 09/05/2020   Personal history of colonic polyps    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   Pure hypercholesterolemia 08/18/2020   Shortness of breath 07/30/2007   Skin laxity 06/12/2017   TIA (transient ischemic attack) ?2005   Urticaria    Vitamin D  deficiency     Past Surgical History:  Procedure Laterality Date   CATARACT EXTRACTION W/ INTRAOCULAR LENS  IMPLANT, BILATERAL Bilateral    CESAREAN SECTION  1971; 1975   CORONARY PRESSURE/FFR STUDY N/A 04/01/2018   Procedure: INTRAVASCULAR PRESSURE WIRE/FFR STUDY;  Surgeon: Elmira Newman PARAS, MD;  Location: [facility] INVASIVE CV LAB;  Service: Cardiovascular;  Laterality: N/A;   DILATATION & CURRETTAGE/HYSTEROSCOPY WITH RESECTOCOPE N/A 10/16/2023   Procedure: DILATATION & CURETTAGE/HYSTEROSCOPY;  Surgeon: Horacio Boas, MD;  Location: [facility] OR;  Service: Gynecology;  Laterality: N/A;   LEFT HEART CATH AND CORONARY ANGIOGRAPHY N/A 04/01/2018   Procedure: LEFT HEART CATH AND CORONARY ANGIOGRAPHY;  Surgeon: Elmira Newman PARAS, MD;  Location: [facility] INVASIVE CV LAB;  Service: Cardiovascular;  Laterality: N/A;   MYOSURE RESECTION N/A 10/16/2023   Procedure: MELINDA RESECTION;  Surgeon: Horacio Boas, MD;  Location: [facility] OR;  Service: Gynecology;  Laterality: N/A;   TUBAL LIGATION  1975   TYMPANOSTOMY TUBE PLACEMENT Left 03/01/2021    Social History   Socioeconomic History   Marital status: Married    Spouse name: Milo   Number of children: 2   Years of education: 16   Highest education level: Bachelor's degree (e.g., BA, AB, BS)  Occupational History   Occupation: Retired  Tobacco Use   Smoking status: Never   Smokeless tobacco: Never  Vaping Use   Vaping status: Never Used  Substance and Sexual Activity    Alcohol  use: Never   Drug use: Never   Sexual activity: Not Currently  Other Topics Concern   Not on file  Social History Narrative   Consumes no caffeine   Right handed   One story house    Social Drivers of Health   Financial Resource Strain: Low Risk  (03/31/2018)   Overall Financial Resource Strain (CARDIA)    Difficulty of Paying Living Expenses: Not hard at all  Food Insecurity: No Food Insecurity (03/31/2018)   Hunger Vital Sign    Worried About Running Out of Food in the Last Year: Never true    Ran Out of Food in the Last Year: Never true  Transportation Needs: No Transportation Needs (03/31/2018)   PRAPARE - Administrator, Civil Service (Medical): No    Lack of Transportation (Non-Medical): No  Physical Activity: Insufficiently Active (03/31/2018)   Exercise Vital Sign    Days of Exercise per Week: 1 day    Minutes of Exercise per Session: 30 min  Stress: No Stress Concern Present (03/31/2018)   [employer] of Occupational Health - Occupational Stress Questionnaire    Feeling of Stress : Not at all  Social Connections: Moderately Integrated (03/31/2018)   Social Connection and Isolation Panel  Frequency of Communication with Friends and Family: More than three times a week    Frequency of Social Gatherings with Friends and Family: More than three times a week    Attends Religious Services: 1 to 4 times per year    Active Member of [employer] or Organizations: No    Attends Banker Meetings: Never    Marital Status: Married  Catering manager Violence: Not At Risk (03/31/2018)   Humiliation, Afraid, Rape, and Kick questionnaire    Fear of Current or Ex-Partner: No    Emotionally Abused: No    Physically Abused: No    Sexually Abused: No    Family History  Problem Relation Age of Onset   Diabetes Mellitus II Mother    Osteoporosis Mother    Hypertension Mother    Diabetes Mellitus II Father    CVA Sister    CAD Sister     Hypertension Sister    Osteoarthritis Sister    Breast cancer Cousin 108   Dementia Cousin        suspected; never confirmed   Breast cancer Cousin    Alcoholism Brother    Epilepsy Brother    Allergic rhinitis Neg Hx    Angioedema Neg Hx    Asthma Neg Hx    Eczema Neg Hx    Urticaria Neg Hx     Allergies as of 03/04/2024 - Review Complete 03/03/2024  Allergen Reaction Noted   Amlodipine  besylate  08/01/2021   Clonidine  derivatives  10/16/2023   Hydralazine   11/19/2021   Losartan  potassium  08/01/2021   Minoxidil   10/16/2023   Naproxen Other (See Comments) 07/30/2007   Prednisone Other (See Comments) 10/19/2012   Amlodipine  Hives 02/04/2018   Isordil  [isosorbide  nitrate] Rash 10/29/2022   Losartan  Rash 02/09/2018    Current Outpatient Medications on File Prior to Visit  Medication Sig Dispense Refill   albuterol  (VENTOLIN  HFA) 108 (90 Base) MCG/ACT inhaler Inhale 2 puffs into the lungs every 4 (four) hours as needed for wheezing or shortness of breath (2 puffs every 4-6 hours as needed for cough/wheeze/shortness of breath/chest tightness.  May use 15-20 minutes prior to activity.). 6.7 g 1   aspirin  EC 81 MG tablet Take 1 tablet (81 mg total) by mouth daily. Swallow whole. 90 tablet 3   Calcium  Citrate-Vitamin D  315-250 MG-UNIT TABS Take 1 tablet by mouth daily.     carboxymethylcellulose (REFRESH PLUS) 0.5 % SOLN Place 1 drop into both eyes in the morning and at bedtime.     Cholecalciferol  (VITAMIN D3) 10 MCG (400 UNIT) CAPS Take 400 Units by mouth daily.     Coenzyme Q10 (COQ10) 200 MG CAPS Take 200 mg by mouth daily.     cycloSPORINE  (RESTASIS ) 0.05 % ophthalmic emulsion Place 1 drop into both eyes 2 (two) times daily.      doxazosin  (CARDURA ) 1 MG tablet Take 1 mg by mouth daily.     fluticasone  (FLONASE ) 50 MCG/ACT nasal spray Place 1 spray into both nostrils in the morning and at bedtime.     fluticasone -salmeterol (WIXELA INHUB) 100-50 MCG/ACT AEPB Inhale 1 puff into the  lungs 2 (two) times daily.     Magnesium  250 MG TABS Take 250 mg by mouth daily.     Multiple Vitamins-Minerals (CENTRUM SILVER  PO) Take 1 tablet by mouth daily.     Pitavastatin  Calcium  2 MG TABS TAKE 1 TABLET BY MOUTH EVERY DAY 90 tablet 7   polyethylene glycol (MIRALAX  / GLYCOLAX ) packet  Take 17 g by mouth daily as needed for moderate constipation.     pyridOXINE  (VITAMIN B-6) 100 MG tablet Take 100 mg by mouth daily.      spironolactone  (ALDACTONE ) 50 MG tablet Take 25 mg by mouth daily.     nitroGLYCERIN  (NITROSTAT ) 0.4 MG SL tablet Place 0.4 mg under the tongue every 5 (five) minutes as needed for chest pain.     traMADol  (ULTRAM ) 50 MG tablet Take 1 tablet (50 mg total) by mouth every 6 (six) hours as needed. (Patient not taking: Reported on 03/04/2024) 15 tablet 0   No current facility-administered medications on file prior to visit.   REVIEW OF SYSTEMS:  Review of Systems  Respiratory:  Negative for shortness of breath.   Cardiovascular:  Negative for chest pain and leg swelling.  Musculoskeletal:  Negative for myalgias.   PHYSICAL EXAMINATION:  Vitals:   03/04/24 1531  BP: 135/72  Pulse: (!) 54  SpO2: 98%  Weight: 117 lb 1.6 oz (53.1 kg)    Body mass index is 19.49 kg/m.  Physical Exam Vitals reviewed.  Pulmonary:     Effort: Pulmonary effort is normal.  Musculoskeletal:        General: No tenderness.     Right lower leg: No edema.  Neurological:     Mental Status: She is alert.    Villalta Score for Post-Thrombotic Syndrome: Pain: Absent Cramps: Mild Heaviness: Absent Paresthesia: Absent Pruritus: Absent Pretibial Edema: Absent Skin Induration: Absent Hyperpigmentation: Absent Redness: Absent Venous Ectasia: Absent Pain on calf compression: Absent Villalta Preliminary Score: 1 Is venous ulcer present?: No If venous ulcer is present and score is <15, then 15 points total are assigned: Absent Villalta Total Score: 1  LABS:  CBC     Component Value  Date/Time   WBC 5.1 03/03/2024 1622   RBC 4.05 03/03/2024 1622   HGB 12.2 03/03/2024 1622   HCT 36.8 03/03/2024 1622   PLT 169 03/03/2024 1622   MCV 90.9 03/03/2024 1622   MCH 30.1 03/03/2024 1622   MCHC 33.2 03/03/2024 1622   RDW 14.3 03/03/2024 1622   LYMPHSABS 1.1 03/03/2024 1622   MONOABS 0.4 03/03/2024 1622   EOSABS 0.1 03/03/2024 1622   BASOSABS 0.0 03/03/2024 1622    Hepatic Function      Component Value Date/Time   PROT 7.4 03/03/2024 1622   PROT 7.1 08/19/2019 1123   ALBUMIN 4.5 03/03/2024 1622   ALBUMIN 4.6 08/19/2019 1123   AST 32 03/03/2024 1622   ALT 25 03/03/2024 1622   ALKPHOS 67 03/03/2024 1622   BILITOT 1.0 03/03/2024 1622   BILITOT 0.6 08/19/2019 1123    Renal Function   Lab Results  Component Value Date   CREATININE 0.85 03/03/2024   CREATININE 0.84 10/18/2023   CREATININE 0.76 10/16/2023    Estimated Creatinine Clearance: 40.6 mL/min (by C-G formula based on SCr of 0.85 mg/dL).   [facility] Vascular Lab Studies:  US  Venous Img Lower Unilateral Right (DVT): IMPRESSION: Occlusive superficial venous thrombosis in the lesser saphenous vein. Otherwise, negative for deep venous thrombosis in the right leg.  ASSESSMENT: Location of thrombus: right superficial vein  Patient without prior history of DVT diagnosed with occlusive superficial vein thrombosis in the right small saphenous vein. No swelling in the RLE, but does have intermittent sharp pain in her right calf. No clear provoking factors identified today. Per Dr. Magda, plan to treat with anticoagulation for 45 days. Will start on Eliquis  today. No concerns on labs for  Eliquis . Cost of Eliquis  with her insurance is unaffordable for her. First month filled with 1 month free card at [facility] during appointment. Provided 2 weeks of medication samples for her to complete remainder of treatment. Extensively counseled on Eliquis  and all patient questions were answered.  Medication Samples have been  provided to the patient.  Drug name: Eliquis        Strength: 5 mg        Qty: 28 tablets LOT: [id number]  Exp.Date: 08/07/2024  Dosing instructions: Take 1 tablet by mouth twice daily.  The patient has been instructed regarding the correct time, dose, and frequency of taking this medication, including desired effects and most common side effects.   PLAN: -Start apixaban  (Eliquis ) 10 mg twice daily for 7 days followed by 5 mg twice daily. -Expected duration of therapy: 45 days. Therapy started on 03/04/24. -Patient educated on purpose, proper use and potential adverse effects of apixaban  (Eliquis ). -Discussed importance of taking medication around the same time every day. -Advised patient of medications to avoid (NSAIDs, aspirin  doses >100 mg daily). -Educated that Tylenol  (acetaminophen ) is the preferred analgesic to lower the risk of bleeding. -Advised patient to alert all providers of anticoagulation therapy prior to starting a new medication or having a procedure. -Emphasized importance of monitoring for signs and symptoms of bleeding (abnormal bruising, prolonged bleeding, nose bleeds, bleeding from gums, discolored urine, black tarry stools). -Educated patient to present to the ED if emergent signs and symptoms of new thrombosis occur.  Follow up: DVT clinic   Izetta Henry, PharmD Deep Vein Thrombosis Clinic Clinical Pharmacist  I have evaluated the patient's chart/imaging and refer this patient to the Clinical Pharmacist Practitioner for medication management. I have reviewed the CPP's documentation and agree with her assessment and plan. I was immediately available during the visit for questions and collaboration.   Fonda FORBES Rim, MD

## 2024-03-04 NOTE — Patient Instructions (Signed)
-  Start apixaban  (Eliquis ) 10 mg twice daily for 7 days followed by 5 mg twice daily. -It is important to take your medication around the same time every day.  -Avoid NSAIDs like ibuprofen (Advil, Motrin) and naproxen (Aleve) as well as aspirin  doses over 100 mg daily. -Tylenol  (acetaminophen ) is the preferred over the counter pain medication to lower the risk of bleeding. -Be sure to alert all of your health care providers that you are taking an anticoagulant prior to starting a new medication or having a procedure. -Monitor for signs and symptoms of bleeding (abnormal bruising, prolonged bleeding, nose bleeds, bleeding from gums, discolored urine, black tarry stools). If you have fallen and hit your head OR if your bleeding is severe or not stopping, seek emergency care.  -Go to the emergency room if emergent signs and symptoms of new clot occur (new or worse swelling and pain in an arm or leg, shortness of breath, chest pain, fast or irregular heartbeats, lightheadedness, dizziness, fainting, coughing up blood) or if you experience a significant color change (pale or blue) in the extremity that has the clot.  If you have any questions or need to reschedule an appointment, please call [phone]. If you are having an emergency, call 911 or present to the nearest emergency room.

## 2024-03-05 ENCOUNTER — Telehealth: Payer: Self-pay | Admitting: Cardiology

## 2024-03-05 ENCOUNTER — Telehealth: Payer: Self-pay | Admitting: Pharmacist

## 2024-03-05 NOTE — Telephone Encounter (Signed)
 Received a call from patient who had questions about side effects of Eliquis  that she read in the package insert dispensed with the medication. Stated she read Eliquis  can cause a rash and she already has eczema, wanted to know if Eliquis  would make her eczema worse. She also read that she should reach out to her provider if she experiences numbness or tingling in her feet, but she already has some neuropathy in her feet. She wanted to know if this was going to be a problem with her taking Eliquis . Counseled that neither of these are major concerns with her taking Eliquis . Discussed that Eliquis  does not commonly cause a rash or cause worsening of eczema, but if she experiences a new rash she should let us  know. Also counseled that there is no issue with her taking Eliquis  with a diagnosis of neuropathy. All patient questions were answered. Patient confirmed understanding.

## 2024-03-05 NOTE — Telephone Encounter (Signed)
 Spoke with pt, she has her medications and currently is not having any issues. Patient voiced understanding to call prior to follow up appointment if there are changes.

## 2024-03-05 NOTE — Telephone Encounter (Signed)
 Pt appt scheduled

## 2024-03-05 NOTE — Telephone Encounter (Signed)
 Pt calling to let Dr. Elmira and Dr. Tobie she had a hospital visit and was diagnosed with a blood clot in her leg. I have scheduled her 03/22/24 for a hospital f/u, but she just wanted to make sure she should not be seen sooner. Please advise.

## 2024-03-09 DIAGNOSIS — I700 Atherosclerosis of aorta: Secondary | ICD-10-CM | POA: Diagnosis not present

## 2024-03-09 DIAGNOSIS — I10 Essential (primary) hypertension: Secondary | ICD-10-CM | POA: Diagnosis not present

## 2024-03-09 DIAGNOSIS — F418 Other specified anxiety disorders: Secondary | ICD-10-CM | POA: Diagnosis not present

## 2024-03-09 DIAGNOSIS — E7800 Pure hypercholesterolemia, unspecified: Secondary | ICD-10-CM | POA: Diagnosis not present

## 2024-03-09 DIAGNOSIS — E1169 Type 2 diabetes mellitus with other specified complication: Secondary | ICD-10-CM | POA: Diagnosis not present

## 2024-03-09 DIAGNOSIS — H35033 Hypertensive retinopathy, bilateral: Secondary | ICD-10-CM | POA: Diagnosis not present

## 2024-03-09 DIAGNOSIS — M169 Osteoarthritis of hip, unspecified: Secondary | ICD-10-CM | POA: Diagnosis not present

## 2024-03-10 ENCOUNTER — Telehealth: Payer: Self-pay | Admitting: Pharmacist

## 2024-03-10 NOTE — Telephone Encounter (Signed)
 Received a call from patient stating that she had an episode of pain in her legs last night. Stated it felt somewhat similar to pain she felt in her right leg prior to superficial vein thrombosis diagnosis, but this time it occurred in both of her legs. She stated the pain has now resolved, but she still has a different feeling in her legs that she was unable to describe. She denied lower extremity weakness. Denied missed doses of Eliquis . Her symptoms do not sound typical of superficial vein thrombosis and are now occurring in both legs. 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 Encouraged her to follow up with PCP and neurology to discuss further.

## 2024-03-15 NOTE — Progress Notes (Deleted)
  Cardiology Office Note:  .   Date:  03/15/2024  ID:  Sara Brown, DOB [birth year], MRN [id number] PCP: Rexanne Ingle, MD  [facility] Providers Cardiologist:  Newman JINNY Lawrence, MD { Click to update primary MD,subspecialty MD or APP then REFRESH:1}   History of Present Illness: .   Sara Brown is a 83 y.o. female   with hypertension, diet controlled diabetes, hyperlipidemia,  GERD and remote history of TIA, mild nonobstructive coronary artery disease.   Patient has been intolerant to several medications-including but not limited to losartan , amlodipine , hydralazine , atenolol, nifedipine , clonidine , reluctant to use carvedilol  and labetalol  due to history of asthma.   Patient went to ED 03/03/24 with superficial DVT and seen in DVT clinic and started on Eliquis .  ROS: ***  Studies Reviewed: [name]         Prior CV Studies: {Select studies to display:26339}  ***  Risk Assessment/Calculations:   {Does this patient have ATRIAL FIBRILLATION?:[id number]} No BP recorded.  {Refresh Note OR Click here to enter BP  :1}***       Physical Exam:   VS:  There were no vitals taken for this visit.   Orhtostatics: No data found. Wt Readings from Last 3 Encounters:  03/04/24 117 lb 1.6 oz (53.1 kg)  03/03/24 116 lb 13.5 oz (53 kg)  02/13/24 115 lb 6.4 oz (52.3 kg)    GEN: Well nourished, well developed in no acute distress NECK: No JVD; No carotid bruits CARDIAC: ***RRR, no murmurs, rubs, gallops RESPIRATORY:  Clear to auscultation without rales, wheezing or rhonchi  ABDOMEN: Soft, non-tender, non-distended EXTREMITIES:  No edema; No deformity   ASSESSMENT AND PLAN: .    Hypertension: Well-controlled, with some low numbers in the morning.  Reduce spironolactone  from 50 mg daily to 25 mg daily.  Continue doxazosin  1 mg daily.   Patient has been intolerant to several medications-including but not limited to losartan , amlodipine , hydralazine , atenolol,  nifedipine , clonidine , reluctant to use carvedilol  and labetalol  due to history of asthma.    Coronary artery disease: Nonobstructive on cath in 2019. Stress test in 11/2022 showed mild inferior ischemia.  In absence of anginal symptoms, continue medical management.   Intolerant to several statins, takes between statin every other day. Not keen on injectable agents.  DVT superficial started on eliquis .     {Are you ordering a CV Procedure (e.g. stress test, cath, DCCV, TEE, etc)?   Press F2        :[id number]}  Dispo: ***  Signed, Olivia Pavy, PA-C

## 2024-03-16 ENCOUNTER — Ambulatory Visit: Attending: Physician Assistant | Admitting: Physician Assistant

## 2024-03-16 ENCOUNTER — Encounter: Payer: Self-pay | Admitting: Physician Assistant

## 2024-03-16 VITALS — BP 140/68 | HR 67 | Ht 65.0 in | Wt 124.0 lb

## 2024-03-16 DIAGNOSIS — E785 Hyperlipidemia, unspecified: Secondary | ICD-10-CM | POA: Diagnosis not present

## 2024-03-16 DIAGNOSIS — I82811 Embolism and thrombosis of superficial veins of right lower extremities: Secondary | ICD-10-CM | POA: Diagnosis not present

## 2024-03-16 DIAGNOSIS — I10 Essential (primary) hypertension: Secondary | ICD-10-CM | POA: Diagnosis not present

## 2024-03-16 DIAGNOSIS — J449 Chronic obstructive pulmonary disease, unspecified: Secondary | ICD-10-CM | POA: Diagnosis not present

## 2024-03-16 DIAGNOSIS — I2510 Atherosclerotic heart disease of native coronary artery without angina pectoris: Secondary | ICD-10-CM | POA: Diagnosis not present

## 2024-03-16 NOTE — Patient Instructions (Signed)
 Medication Instructions:  Your physician recommends that you continue on your current medications as directed. Please refer to the Current Medication list given to you today.  *If you need a refill on your cardiac medications before your next appointment, please call your pharmacy*  Lab Work: None ordered If you have labs (blood work) drawn today and your tests are completely normal, you will receive your results only by: MyChart Message (if you have MyChart) OR A paper copy in the mail If you have any lab test that is abnormal or we need to change your treatment, we will call you to review the results.  Follow-Up: At [facility], you and your health needs are our priority.  As part of our continuing mission to provide you with exceptional heart care, our providers are all part of one team.  This team includes your primary Cardiologist (physician) and Advanced Practice Providers or APPs (Physician Assistants and Nurse Practitioners) who all work together to provide you with the care you need, when you need it.  Your next appointment:   6 month(s)  Provider:   Newman JINNY Lawrence, MD   Call and schedule a follow up appointment with Dr Magda with vascular surgery. [phone]

## 2024-03-16 NOTE — Progress Notes (Signed)
 Cardiology Office Note:  .   Date:  03/16/2024  ID:  Sara Brown, DOB Jun 28, 1940, MRN [id number] PCP: Rexanne Ingle, MD   HeartCare Providers Cardiologist:  Newman JINNY Lawrence, MD {  History of Present Illness: .   Sara Brown is a 83 y.o. female   with hypertension, diet controlled diabetes, hyperlipidemia, COPD, GERD and remote history of TIA, mild nonobstructive coronary artery disease.   Patient has been intolerant to several medications-including but not limited to losartan , amlodipine , hydralazine , atenolol, nifedipine , clonidine , reluctant to use carvedilol  and labetalol  due to history of asthma.   Patient went to ED 03/03/24 with superficial DVT and seen in DVT clinic and started on Eliquis .She presents for follow-up visit.   She experiences pain in her right leg due to a blood clot, which has improved with Eliquis  5 mg twice daily. Occasionally, she feels a funny sensation in her leg. There is no shortness of breath. She has a history of pain and swelling in her left leg and knee, with arthritis.  She has coronary artery disease identified in 2019 via cardiac catheterization. She occasionally experiences chest pain and has a chronic cough with clear phlegm. She uses Advair and fluticasone  for management. She has COPD.  She is on spironolactone  25 mg daily and doxazosin  1 mg daily for blood pressure management. She has multiple medication allergies and adverse reactions, including to losartan , amlodipine , hydralazine , and atenolol. She experienced skin darkening with clonidine  and facial hair growth with minoxidil .  She is involved in a program that monitors her blood pressure daily, which is transmitted to a healthcare system. Her blood pressure was high during an ER visit but has since improved.  She will need follow-up with Dr. Marylouise in for her DVT and for guidance on length of therapy for her Eliquis   Reports no shortness of breath nor dyspnea on  exertion. Reports no chest pain, pressure, or tightness. No edema, orthopnea, PND. Reports no palpitations.   Discussed the use of AI scribe software for clinical note transcription with the patient, who gave verbal consent to proceed.   ROS: Pertinent ROS in HPI  Studies Reviewed: [name]         Prior CV Studies: No results found for this or any previous visit from the past 3650 days.    IMPRESSIONS     1. Left ventricular ejection fraction, by estimation, is 65 to 70%. The  left ventricle has normal function. The left ventricle has no regional  wall motion abnormalities. Left ventricular diastolic parameters are  consistent with Grade I diastolic  dysfunction (impaired relaxation).   2. Right ventricular systolic function is normal. The right ventricular  size is normal. There is normal pulmonary artery systolic pressure. The  estimated right ventricular systolic pressure is 24.2 mmHg.   3. The mitral valve is grossly normal. Trivial mitral valve  regurgitation. No evidence of mitral stenosis.   4. The aortic valve is tricuspid. Aortic valve regurgitation is not  visualized. No aortic stenosis is present.   5. The inferior vena cava is normal in size with greater than 50%  respiratory variability, suggesting right atrial pressure of 3 mmHg.   Comparison(s): No significant change from prior study.       Physical Exam:   VS:  BP (!) 140/68   Pulse 67   Ht 5' 5 (1.651 m)   Wt 124 lb (56.2 kg)   SpO2 97%   BMI 20.63 kg/m    Orhtostatics: No  data found. Wt Readings from Last 3 Encounters:  03/16/24 124 lb (56.2 kg)  03/04/24 117 lb 1.6 oz (53.1 kg)  03/03/24 116 lb 13.5 oz (53 kg)    GEN: Well nourished, well developed in no acute distress NECK: No JVD; No carotid bruits CARDIAC: RRR, no murmurs, rubs, gallops RESPIRATORY:  Clear to auscultation without rales, wheezing or rhonchi  ABDOMEN: Soft, non-tender, non-distended EXTREMITIES:  No edema; No deformity    ASSESSMENT AND PLAN: .    Superficial thrombosis of the right lower leg  -acute superficial thrombosis in the right leg with significant pain improvement on Eliquis . - Continue Eliquis  5 mg twice daily. - Follow up with vascular surgery for evaluation of anticoagulation duration.  Coronary artery disease Coronary artery disease identified in 2019. No current cardiac symptoms. Chronic cough likely non-cardiac. - Continue current medication management, no current chest pain  Hypertension Hypertension managed with spironolactone  and doxazosin . Blood pressure currently well-controlled. Discussed management to avoid hypotension and fall risk. Home monitoring in place per primary care. - Continue spironolactone  25 mg daily. - Continue doxazosin  1 mg daily. - Monitor blood pressure at home.  Chronic obstructive pulmonary disease (COPD) COPD managed with Advair and fluticasone . Chronic cough with clear phlegm, no active infection. Continued current regimen due to delivery concerns. - Continue Advair. - Continue fluticasone .  Osteoarthritis of bilateral knees Chronic osteoarthritis with knee swelling and discomfort. Discussed steroid injection risks. Consideration of non-steroidal gel injection. - Consider non-steroidal gel injection for knee inflammation. - Use Tylenol  Arthritis for inflammation management.  Peripheral neuropathy Peripheral neuropathy with tingling sensation in feet. - Please address with primary care provider  Unintentional weight loss (improving) Weight gain from 112 lbs to 124 lbs. Improvement noted with increased caloric intake. - Continue current dietary program to support weight gain. - Congratulated her on her weight gain  Hypertension: Well-controlled, with some low numbers in the morning.  Reduce spironolactone  from 50 mg daily to 25 mg daily.  Continue doxazosin  1 mg daily.   Patient has been intolerant to several medications-including but not limited to  losartan , amlodipine , hydralazine , atenolol, nifedipine , clonidine , reluctant to use carvedilol  and labetalol  due to history of asthma.    Coronary artery disease: Nonobstructive on cath in 2019. Stress test in 11/2022 showed mild inferior ischemia.  In absence of anginal symptoms, continue medical management.   Intolerant to several statins, takes between statin every other day. Not keen on injectable agents.      Dispo: She can follow-up in 6 months with Dr. Elmira.   Signed, Orren LOISE Fabry, PA-C

## 2024-03-17 ENCOUNTER — Telehealth: Payer: Self-pay | Admitting: Cardiology

## 2024-03-17 NOTE — Telephone Encounter (Signed)
 Pt returned c/b

## 2024-03-17 NOTE — Telephone Encounter (Signed)
 Informed her about the information from previous note. Instructed her to keep appt with the pharmacist at [facility] and they will be able to help her with guidance on length of therapy for Eliquis  and they should be able to give further information about when follow up is needed with the [facility] provider as well.  Informed her that I would also pass on this information to the PA and if she needs you to do anything else/make any further appointments with [facility], then we will call and let the patient know. She verbalized understanding.

## 2024-03-17 NOTE — Telephone Encounter (Signed)
 Pt called in because she told by PA Lucien to schedule a f/u with Dr [facility] and the [facility] told her that the appt was not needed for Dr Magda. Pt would like a c/b regarding this matter.

## 2024-03-17 NOTE — Telephone Encounter (Signed)
 From Office Visit: She will need follow-up with Dr. Marylouise in for her DVT and for guidance on length of therapy for her Eliquis     Left message and call back number  Fyi: (Looks like she has an appt with a pharmacist at [facility] on 04/14/24. They would be able to give her guidance on the length of therapy for Eliquis  and would be able to direct her/ on when follow up is needed with the provider after that visit)

## 2024-03-18 NOTE — Telephone Encounter (Signed)
 Lucien Orren SAILOR, PA-C to [facility] Triage (Selected Message)     03/18/24  8:23 AM Okay, that is totally fine. I didn't realize she was seeing a PharmD   Thanks! Orren SAILOR Lucien, PA-C  S/w the pt and gave the information above. She verbalized understanding

## 2024-03-21 DIAGNOSIS — I10 Essential (primary) hypertension: Secondary | ICD-10-CM | POA: Diagnosis not present

## 2024-03-21 DIAGNOSIS — E1169 Type 2 diabetes mellitus with other specified complication: Secondary | ICD-10-CM | POA: Diagnosis not present

## 2024-03-21 DIAGNOSIS — H35033 Hypertensive retinopathy, bilateral: Secondary | ICD-10-CM | POA: Diagnosis not present

## 2024-03-21 DIAGNOSIS — I700 Atherosclerosis of aorta: Secondary | ICD-10-CM | POA: Diagnosis not present

## 2024-03-22 ENCOUNTER — Ambulatory Visit: Admitting: Physician Assistant

## 2024-03-29 ENCOUNTER — Telehealth: Payer: Self-pay | Admitting: Pharmacist

## 2024-03-29 NOTE — Telephone Encounter (Signed)
 Patient called reporting a woozy feeling in her legs that has been going on for the past 3-4 days. She wanted to know who she should follow up with for evaluation of this. Stated that she has an orthopedic provider she saw in the past for a similar knee issue. Reports adherence to Eliquis  as prescribed and denies missed doses. Stated that the pain in her leg when she was diagnosed with superficial vein thrombosis has resolved. Encouraged her to follow up with orthopedic provider she has seen before or her primary care provider for the new sensation in her legs as this does not sound related to the clot and she has had similar symptoms before. Reminded her of DVT Clinic appointment scheduled for 04/14/24. Patient confirmed understanding.

## 2024-03-29 NOTE — Telephone Encounter (Signed)
 Error

## 2024-03-30 DIAGNOSIS — R269 Unspecified abnormalities of gait and mobility: Secondary | ICD-10-CM | POA: Diagnosis not present

## 2024-03-30 DIAGNOSIS — M25562 Pain in left knee: Secondary | ICD-10-CM | POA: Diagnosis not present

## 2024-03-30 DIAGNOSIS — R209 Unspecified disturbances of skin sensation: Secondary | ICD-10-CM | POA: Diagnosis not present

## 2024-03-30 DIAGNOSIS — G8929 Other chronic pain: Secondary | ICD-10-CM | POA: Diagnosis not present

## 2024-03-30 DIAGNOSIS — M25561 Pain in right knee: Secondary | ICD-10-CM | POA: Diagnosis not present

## 2024-04-01 DIAGNOSIS — M25562 Pain in left knee: Secondary | ICD-10-CM | POA: Diagnosis not present

## 2024-04-01 DIAGNOSIS — M170 Bilateral primary osteoarthritis of knee: Secondary | ICD-10-CM | POA: Diagnosis not present

## 2024-04-01 DIAGNOSIS — M79604 Pain in right leg: Secondary | ICD-10-CM | POA: Diagnosis not present

## 2024-04-01 DIAGNOSIS — M25561 Pain in right knee: Secondary | ICD-10-CM | POA: Diagnosis not present

## 2024-04-05 ENCOUNTER — Encounter (HOSPITAL_BASED_OUTPATIENT_CLINIC_OR_DEPARTMENT_OTHER): Payer: Self-pay

## 2024-04-05 ENCOUNTER — Emergency Department (HOSPITAL_BASED_OUTPATIENT_CLINIC_OR_DEPARTMENT_OTHER)
Admission: EM | Admit: 2024-04-05 | Discharge: 2024-04-05 | Disposition: A | Discharge status: Home / Self Care | Attending: Emergency Medicine | Admitting: Emergency Medicine

## 2024-04-05 ENCOUNTER — Other Ambulatory Visit: Payer: Self-pay

## 2024-04-05 DIAGNOSIS — Z7901 Long term (current) use of anticoagulants: Secondary | ICD-10-CM | POA: Diagnosis not present

## 2024-04-05 DIAGNOSIS — R1030 Lower abdominal pain, unspecified: Secondary | ICD-10-CM | POA: Diagnosis present

## 2024-04-05 DIAGNOSIS — Z7982 Long term (current) use of aspirin: Secondary | ICD-10-CM | POA: Insufficient documentation

## 2024-04-05 DIAGNOSIS — R350 Frequency of micturition: Secondary | ICD-10-CM | POA: Insufficient documentation

## 2024-04-05 DIAGNOSIS — R1024 Suprapubic pain: Secondary | ICD-10-CM

## 2024-04-05 LAB — BASIC METABOLIC PANEL WITH GFR
Anion gap: 10 (ref 5–15)
BUN: 27 mg/dL — ABNORMAL HIGH (ref 8–23)
CO2: 26 mmol/L (ref 22–32)
Calcium: 10.1 mg/dL (ref 8.9–10.3)
Chloride: 103 mmol/L (ref 98–111)
Creatinine, Ser: 0.79 mg/dL (ref 0.44–1.00)
GFR, Estimated: >60 mL/min (ref >60)
Glucose, Bld: 76 mg/dL (ref 70–99)
Potassium: 4.5 mmol/L (ref 3.5–5.1)
Sodium: 139 mmol/L (ref 135–145)

## 2024-04-05 LAB — URINALYSIS, W/ REFLEX TO CULTURE (INFECTION SUSPECTED)
Bacteria, UA: NONE SEEN
Bilirubin Urine: NEGATIVE
Glucose, UA: NEGATIVE mg/dL
Ketones, ur: NEGATIVE mg/dL
Leukocytes,Ua: NEGATIVE
Nitrite: NEGATIVE
Protein, ur: NEGATIVE mg/dL
Specific Gravity, Urine: 1.013 (ref 1.005–1.030)
pH: 6 (ref 5.0–8.0)

## 2024-04-05 LAB — CBC WITH DIFFERENTIAL/PLATELET
Abs Immature Granulocytes: 0.01 K/uL (ref 0.00–0.07)
Basophils Absolute: 0 K/uL (ref 0.0–0.1)
Basophils Relative: 0 %
Eosinophils Absolute: 0.1 K/uL (ref 0.0–0.5)
Eosinophils Relative: 1 %
HCT: 37.1 % (ref 36.0–46.0)
Hemoglobin: 12.2 g/dL (ref 12.0–15.0)
Immature Granulocytes: 0 %
Lymphocytes Relative: 20 %
Lymphs Abs: 1.1 K/uL (ref 0.7–4.0)
MCH: 30 pg (ref 26.0–34.0)
MCHC: 32.9 g/dL (ref 30.0–36.0)
MCV: 91.2 fL (ref 80.0–100.0)
Monocytes Absolute: 0.6 K/uL (ref 0.1–1.0)
Monocytes Relative: 10 %
Neutro Abs: 3.8 K/uL (ref 1.7–7.7)
Neutrophils Relative %: 69 %
Platelets: 187 K/uL (ref 150–400)
RBC: 4.07 MIL/uL (ref 3.87–5.11)
RDW: 14.2 % (ref 11.5–15.5)
WBC: 5.6 K/uL (ref 4.0–10.5)
nRBC: 0 % (ref 0.0–0.2)

## 2024-04-05 MED ORDER — FOSFOMYCIN TROMETHAMINE 3 G PO PACK
3.0000 g | PACK | Freq: Once | ORAL | Status: AC
  Administered 2024-04-05: 3 g via ORAL
  Filled 2024-04-05: qty 3

## 2024-04-05 NOTE — ED Notes (Signed)
 Pt d/c instructions, medications, and follow-up care reviewed with pt. Pt verbalized understanding and had no further questions at time of d/c. Pt CA&Ox4, ambulatory, and in NAD at time of d/c

## 2024-04-05 NOTE — ED Triage Notes (Signed)
 Pt c/o suprapubic pain that started this morning around 1000. Pt reports had doctor's appt but had to leave d/t pain. Reports gave a urine sample last week at PCP and was told she had group b strep but abx were not indicated d/t having no sx. Pt does report urinary frequency. Also c/o bilateral lower leg pain, currently on Eliquis  for DVT in right lower leg.

## 2024-04-05 NOTE — ED Provider Notes (Signed)
 [facility] EMERGENCY DEPARTMENT AT [facility] Provider Note   CSN: [id number] Arrival date & time: 04/05/24  1119     Patient presents with: Abdominal Pain and Leg Pain   Sara Brown is a 83 y.o. female who presents emergency department chief complaint of suprapubic pain.  Patient reports that she had sudden onset of sharp, gripping pain in her suprapubic region which lasted approximately 20 minutes and then self resolved.  She was physical see her primary care physician today but went over told them she could not stand and came here for evaluation.  She states that her symptoms resolved but she was worried because recently she had a urine test that showed group B strep and she read that that could be dangerous for elderly people.  She also recently was diagnosed with a superficial thrombosis in her right lower extremity.  She is currently on Eliquis .  She denies any difficulty urinating flank pain fevers chills nausea vomiting or cold sweats.  She denies diarrhea or constipation.    Abdominal Pain Leg Pain      Prior to Admission medications   Medication Sig Start Date End Date Taking? Authorizing Provider  apixaban  (ELIQUIS ) 5 MG TABS tablet Take 1 tablet (5 mg total) by mouth 2 (two) times daily. Start taking after completion of starter pack. 03/04/24  Yes Lanis Fonda BRAVO, MD  albuterol  (VENTOLIN  HFA) [phone] Base) MCG/ACT inhaler Inhale 2 puffs into the lungs every 4 (four) hours as needed for wheezing or shortness of breath (2 puffs every 4-6 hours as needed for cough/wheeze/shortness of breath/chest tightness.  May use 15-20 minutes prior to activity.). 04/17/23   Jeneal Danita Macintosh, MD  Apixaban  Starter Pack, 10mg  and 5mg , (ELIQUIS  DVT/PE STARTER PACK) Take as directed on package: start with two-5mg  tablets twice daily for 7 days. On day 8, switch to one-5mg  tablet twice daily. 03/04/24   Lanis Fonda BRAVO, MD  aspirin  EC 81 MG tablet Take 1 tablet (81 mg  total) by mouth daily. Swallow whole. 01/07/22   Patwardhan, Newman PARAS, MD  Calcium  Citrate-Vitamin D  315-250 MG-UNIT TABS Take 1 tablet by mouth daily.    [provider]  carboxymethylcellulose (REFRESH PLUS) 0.5 % SOLN Place 1 drop into both eyes in the morning and at bedtime.    [provider]  Cholecalciferol  (VITAMIN D3) 10 MCG (400 UNIT) CAPS Take 400 Units by mouth daily.    [provider]  Coenzyme Q10 (COQ10) 200 MG CAPS Take 200 mg by mouth daily.    [provider]  cycloSPORINE  (RESTASIS ) 0.05 % ophthalmic emulsion Place 1 drop into both eyes 2 (two) times daily.     [provider]  doxazosin  (CARDURA ) 1 MG tablet Take 1 mg by mouth daily.    [provider]  fluticasone  (FLONASE ) 50 MCG/ACT nasal spray Place 1 spray into both nostrils in the morning and at bedtime.    [provider]  fluticasone -salmeterol (WIXELA INHUB) 100-50 MCG/ACT AEPB Inhale 1 puff into the lungs 2 (two) times daily.    [provider]  Magnesium  250 MG TABS Take 250 mg by mouth daily.    [provider]  Multiple Vitamins-Minerals (CENTRUM SILVER  PO) Take 1 tablet by mouth daily.    [provider]  nitroGLYCERIN  (NITROSTAT ) 0.4 MG SL tablet Place 0.4 mg under the tongue every 5 (five) minutes as needed for chest pain.    [provider]  Pitavastatin  Calcium  2 MG TABS TAKE  1 TABLET BY MOUTH EVERY DAY 05/22/23   Patwardhan, Manish J, MD  polyethylene glycol (MIRALAX  / GLYCOLAX ) packet Take 17 g by mouth daily as needed for moderate constipation.    [provider]  pyridOXINE  (VITAMIN B-6) 100 MG tablet Take 100 mg by mouth daily.     [provider]  spironolactone  (ALDACTONE ) 50 MG tablet Take 25 mg by mouth daily.    [provider]  traMADol  (ULTRAM ) 50 MG tablet Take 1 tablet (50 mg total) by mouth every 6 (six) hours as needed. 02/03/24   Armenta Canning, MD    Allergies:  Amlodipine  besylate, Clonidine  derivatives, Hydralazine , Losartan  potassium, Minoxidil , Naproxen, Prednisone, Amlodipine , Isordil  [isosorbide  nitrate], and Losartan     Review of Systems  Gastrointestinal:  Positive for abdominal pain.    Updated Vital Signs BP 124/78   Pulse 65   Temp 98.4 F (36.9 C) (Oral)   Resp 16   Ht 5' 5 (1.651 m)   Wt 54 kg   SpO2 98%   BMI 19.80 kg/m   Physical Exam Vitals and nursing note reviewed.  Constitutional:      General: She is not in acute distress.    Appearance: She is well-developed. She is not diaphoretic.  HENT:     Head: Normocephalic and atraumatic.     Right Ear: External ear normal.     Left Ear: External ear normal.     Nose: Nose normal.     Mouth/Throat:     Mouth: Mucous membranes are moist.  Eyes:     General: No scleral icterus.    Conjunctiva/sclera: Conjunctivae normal.  Cardiovascular:     Rate and Rhythm: Normal rate and regular rhythm.     Heart sounds: Normal heart sounds. No murmur heard.    No friction rub. No gallop.  Pulmonary:     Effort: Pulmonary effort is normal. No respiratory distress.     Breath sounds: Normal breath sounds.  Abdominal:     General: Bowel sounds are normal. There is no distension.     Palpations: Abdomen is soft. There is no mass.     Tenderness: There is no abdominal tenderness. There is no right CVA tenderness, left CVA tenderness, guarding or rebound.     Hernia: No hernia is present.  Musculoskeletal:     Cervical back: Normal range of motion.  Skin:    General: Skin is warm and dry.  Neurological:     Mental Status: She is alert and oriented to person, place, and time.  Psychiatric:        Behavior: Behavior normal.     (all labs ordered are listed, but only abnormal results are displayed) Labs Reviewed  URINALYSIS, W/ REFLEX TO CULTURE (INFECTION SUSPECTED) - Abnormal; Notable for the following components:      Result Value   Hgb urine dipstick TRACE (*)    All  other components within normal limits  BASIC METABOLIC PANEL WITH GFR - Abnormal; Notable for the following components:   BUN 27 (*)    All other components within normal limits  CBC WITH DIFFERENTIAL/PLATELET    EKG: None  Radiology: No results found.   Procedures   Medications Ordered in the ED  fosfomycin (MONUROL) packet 3 g (3 g Oral Given 04/05/24 1510)  Medical Decision Making Amount and/or Complexity of Data Reviewed Labs: ordered.  Risk Prescription drug management.   83 year old female who presents sharp pelvic pain that was resolving after 2.  Patient also had urinary frequency.  She is concerned because of her recent group beta strep positive urinalysis.  Today the patient has a benign abdominal exam.  She has trace hemoglobin and her urine she could have had a recent passed urinary stone however does not have any flank pain or fevers.  Patient has not had any treatment for her recent symptoms and I think based on her current symptoms she may need treatment.  Patient given a single dose of fosfomycin for treatment of her symptoms.  She is having no significant issues.  Patient appears otherwise appropriate for discharge at this time with strict return     Final diagnoses:  Urinary frequency  Suprapubic pain, acute    ED Discharge Orders     None          Arloa Chroman, PA-C 04/05/24 1935    Bari Roxie HERO, DO 04/07/24 1512

## 2024-04-05 NOTE — Discharge Instructions (Addendum)
 You were treated in the emergency department for your positive urine culture for group beta strep in the emergency department using a single dose of fosfomycin.  This is considered full treatment for this disorder.  The remainder of your workup was reassuring with no significant abnormal normal labs, no fever or chills and a very benign exam of your abdomen.  You should follow closely with your primary care physician and get help right away if you have any new or worsening symptoms or new emergent concerns.    ### Group B Strep in Elderly     **What is Group B Streptococcus (GBS)?**      Group B Streptococcus (GBS) is a type of bacteria that can live in the body without causing problems. It is often found in the vagina or urinary tract. In most cases, GBS does not cause symptoms or illness.      **Why does GBS matter for older women?**      GBS can sometimes cause serious infections in older adults, especially those with health problems like diabetes, obesity, or weakened immune systems. These infections can affect the skin, joints, lungs, or urinary tract, and may lead to bloodstream infections (sepsis).[1][2][3][4][5]      **How common is GBS in elderly women?**      About 1 in 5 older women carry GBS, which is similar to younger women. Most people who carry GBS do not get sick from it.[6][7][4]      **What are the symptoms of GBS infection?**      GBS infection can cause:      - Fever or chills      - Pain or swelling in the skin or joints      - Cough or trouble breathing      - Burning or pain when urinating      - Feeling very tired or confused      If you have these symptoms, especially with other health problems, you should contact your healthcare provider.      **Do I need treatment if GBS is found in my urine?**      If GBS is found in your urine but you do not have symptoms (like pain, burning, or fever), you usually do **not** need antibiotics. Treating GBS when you  have no symptoms does not help and can cause side effects. If you do have symptoms of a urinary tract infection, your doctor may recommend antibiotics.[4][8]      **What antibiotics are used if I need treatment?**      GBS is usually treated with antibiotics such as penicillin or cephalosporins, which are very effective. GBS is becoming more resistant to some antibiotics like erythromycin and clindamycin, so your doctor may choose the best medicine based on your health and local resistance patterns. Gentamicin is rarely used in older adults because it can harm the kidneys.[1][3][9][2][8][7]      **Can GBS be prevented?**      There is no vaccine for GBS yet, but research is ongoing. Keeping skin healthy, managing diabetes, and treating wounds quickly can help lower your risk of infection.[2][7]      **When should I call my doctor?**      Contact your healthcare provider if you have:      - Fever, chills, or confusion      - Pain, redness, or swelling in the skin or joints      - Burning or pain when urinating      -  Any new or worsening symptoms      **Summary**      GBS is common in older women and usually does not cause problems. Treatment is only needed if you have symptoms of infection. Antibiotics are effective, but your doctor will choose the best one for you. If you have questions or symptoms, talk to your healthcare provider.      ### References  1. Group B Streptococcal Disease in Nonpregnant Adults. Jannetta MM. Clinical Infectious Diseases : An Official Publication of the Infectious Diseases Society of America. 2001;33(4):556-61. doi:10.1086/322696. 2. Epidemiology of Invasive Group B Streptococcal Infections Among Nonpregnant Adults in the [place] , 2008-2016. Inga Music TRUETT Lonna LITTIE Malachi RENATA, et al. JAMA Internal Medicine. 2019;179(4):479-488. doi:10.1001/jamainternmed.7981.2730. 3. Invasive Group B Streptococcus Infections in Non-Pregnant Adults: A Retrospective  Study, [place], 2007-2019. Harvis OLEGARIO Lajuanda JAYSON, Plain[place] C, et al. Clinical Microbiology and Infection : The Official Publication of the European Society of Clinical Microbiology and Infectious Diseases. 2021;27(1):129.e1-129.e4. doi:10.1016/j.cmi.2020.09.037. 4. Group B Streptococcal Infections in Elderly Adults. Edwards MS, Baker CJ. Clinical Infectious Diseases : An Official Publication of the Infectious Diseases Society of America. 2005;41(6):839-47. doi:10.1086/432804. 5. Osteoarticular and Skin and Soft-Tissue Infections Caused by Streptococcus Agalactiae in Elderly Patients Are Frequently Associated With Bacteremia. Ruppen C, Notter J, Strahm C, Sonderegger B, Sendi P. Diagnostic Microbiology and Infectious Disease. 2018;90(1):55-57. doi:10.1016/j.diagmicrobio.2017.09.008. 6. Group B Streptococcal Colonization in Elderly Women. Baldan R, Droz S, Casanova C, et al. [facility] Infectious Diseases. 2021;21(1):408. doi:10.1186/s12879-021-06102-x. 7. Molecular Characterization and Antimicrobial Susceptibility of Hemolytic Streptococcus Agalactiae From Post-Menopausal Women. Molt-Garca B, Libana-Martos Mdel C, Cuadros-Moronta E, et al. Robin. 2016;85:5-10. doi:10.1016/j.maturitas.2015.11.007. 8. Clinical Characteristics and Antimicrobial Susceptibility of Invasive Group B Streptococcal Infections in Nonpregnant Adults in [place]. Arminda CHANG, Alena DUKES, Ko WC, [place] YC. Journal of the [place] Medical Association = [place] Yi Zhi. 1997;96(8):628-33. 9. Is Gentamicin Necessary in the Antimicrobial Treatment for Group B Streptococcal Infections in the Elderly? An in Vitro Study With Human Blood Products. Ruppen C, Decosterd L, Sendi P. Infectious Diseases [place], [place]). 2017;49(3):185-192. doi:10.1080/23744235.2016.1244612.

## 2024-04-08 DIAGNOSIS — I10 Essential (primary) hypertension: Secondary | ICD-10-CM | POA: Diagnosis not present

## 2024-04-08 DIAGNOSIS — E782 Mixed hyperlipidemia: Secondary | ICD-10-CM | POA: Diagnosis not present

## 2024-04-08 DIAGNOSIS — R636 Underweight: Secondary | ICD-10-CM | POA: Diagnosis not present

## 2024-04-08 DIAGNOSIS — Z681 Body mass index (BMI) 19 or less, adult: Secondary | ICD-10-CM | POA: Diagnosis not present

## 2024-04-08 DIAGNOSIS — K5900 Constipation, unspecified: Secondary | ICD-10-CM | POA: Diagnosis not present

## 2024-04-08 DIAGNOSIS — Z8673 Personal history of transient ischemic attack (TIA), and cerebral infarction without residual deficits: Secondary | ICD-10-CM | POA: Diagnosis not present

## 2024-04-08 DIAGNOSIS — E1169 Type 2 diabetes mellitus with other specified complication: Secondary | ICD-10-CM | POA: Diagnosis not present

## 2024-04-08 DIAGNOSIS — R634 Abnormal weight loss: Secondary | ICD-10-CM | POA: Diagnosis not present

## 2024-04-09 DIAGNOSIS — M169 Osteoarthritis of hip, unspecified: Secondary | ICD-10-CM | POA: Diagnosis not present

## 2024-04-09 DIAGNOSIS — E7800 Pure hypercholesterolemia, unspecified: Secondary | ICD-10-CM | POA: Diagnosis not present

## 2024-04-09 DIAGNOSIS — H35033 Hypertensive retinopathy, bilateral: Secondary | ICD-10-CM | POA: Diagnosis not present

## 2024-04-09 DIAGNOSIS — E1169 Type 2 diabetes mellitus with other specified complication: Secondary | ICD-10-CM | POA: Diagnosis not present

## 2024-04-09 DIAGNOSIS — I700 Atherosclerosis of aorta: Secondary | ICD-10-CM | POA: Diagnosis not present

## 2024-04-09 DIAGNOSIS — F418 Other specified anxiety disorders: Secondary | ICD-10-CM | POA: Diagnosis not present

## 2024-04-09 DIAGNOSIS — I10 Essential (primary) hypertension: Secondary | ICD-10-CM | POA: Diagnosis not present

## 2024-04-13 DIAGNOSIS — M79661 Pain in right lower leg: Secondary | ICD-10-CM | POA: Diagnosis not present

## 2024-04-13 DIAGNOSIS — M1712 Unilateral primary osteoarthritis, left knee: Secondary | ICD-10-CM | POA: Diagnosis not present

## 2024-04-13 DIAGNOSIS — M79662 Pain in left lower leg: Secondary | ICD-10-CM | POA: Diagnosis not present

## 2024-04-13 DIAGNOSIS — M1711 Unilateral primary osteoarthritis, right knee: Secondary | ICD-10-CM | POA: Diagnosis not present

## 2024-04-13 NOTE — Progress Notes (Unsigned)
 DVT Clinic Note  Name: Sara Brown     MRN: [id number]     DOB: 13-Oct-1940     Sex: female  PCP: Rexanne Ingle, MD  Today's Visit: Visit Information: Discharge Visit  Referred to DVT Clinic by: Emergency Department - Dr. Juliene Bicker Referred to CPP by: Dr. Sheree Reason for referral:  Chief Complaint  Patient presents with   Superficial vein thrombosis   HISTORY OF PRESENT ILLNESS: Sara Brown is a 83 y.o. female with PMH TIA, CAD, HTN, T2DM, HLD, IBS, who presents after diagnosis of DVT for medication management. Seen in the ED for right calf pain. Ultrasound showed occlusive superficial vein thrombosis in the right lesser saphenous vein. ED discussed the patient with Dr. Magda who recommended giving the patient a dose of Lovenox  and referring them to DVT Clinic to follow up the next day. She was last seen in DVT Clinic on 03/04/24 at which time she was started on Eliquis  with plan to treat for 45 days.  Today, patient reports she has been feeling okay. She attributes changes in how she is feeling to BP changes. Denies abnormal bleeding or bruising. Denies missed doses of Eliquis . Reports the sharp pains in her right leg have resolved. Endorses tingling and feeling like something is moving in both of her lower legs. No swelling.   Positive Thrombotic Risk Factors: Older Age Bleeding Risk Factors: Age >65 years, Antiplatelet therapy  Negative Thrombotic Risk Factors: Previous VTE, Recent surgery (within 3 months), Recent trauma (within 3 months), Recent admission to hospital with acute illness (within 3 months), Paralysis, paresis, or recent plaster cast immobilization of lower extremity, Central venous catheterization, Sedentary journey lasting >8 hours within 4 weeks, Bed rest >72 hours within 3 months, Within 6 weeks postpartum, Estrogen therapy, Pregnancy, Recent COVID diagnosis (within 3 months), Erythropoiesis-stimulating agent, Recent cesarean section (within 3  months), Testosterone therapy, Active cancer, Non-malignant, chronic inflammatory condition, Known thrombophilic condition, Smoking, Obesity  Rx Insurance Coverage: Medicare Rx Affordability:  Eliquis  is $200 per 1 month supply, Eliquis  is $99 for a 15 day supply.  Rx Assistance Provided: Free 30-day trial card Medication samples Preferred Pharmacy: First fill at [facility] during initial appointment. Medication samples also were provided for her to complete remaining 2 weeks of treatment as the cost of Eliquis  with her insurance is unaffordable for her.   Past Medical History:  Diagnosis Date   Age-related osteoporosis without current pathological fracture 08/18/2020   Allergic rhinitis    Angina at rest 02/09/2018   Arthritis    fingers, knees (02/09/2018)   Asthma    stopped taking RX after dr said I don't have this (02/09/2018)   Basal cell carcinoma    Bradycardia 02/09/2018   Carotid stenosis    ICA(L)   Chest pain due to coronary artery disease 02/09/2018   Chronic bronchitis (HCC)    Chronic constipation 08/18/2020   Complication of anesthesia    felt the cut w/one of my c-sections (02/09/2018)   Coronary artery disease involving native coronary artery of native heart without angina pectoris 09/17/2018   Diabetes mellitus type 2, controlled 03/31/2018   Eczema    EEG abnormal    Essential hypertension 03/31/2018   Generalized anxiety disorder    GERD (gastroesophageal reflux disease)    Hardening of the aorta (main artery of the heart)    Headache    years ago; before menopause (02/09/2018)   Hirsutism 06/12/2017   Hyperglycemia due to type 2  diabetes mellitus 03/09/2021   Hyperlipidemia    Hyperpigmentation 02/13/2017   Hypertensive retinopathy    Irritable bowel syndrome    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   Mixed hyperlipidemia  12/29/2020   Myringotomy tube status 04/03/2021   Neck mass 01/17/2017   Neuropathic pain 03/09/2021   OA (osteoarthritis)    Osteoporosis    Palpitations 03/16/2021   Paresthesias 09/05/2020   Personal history of colonic polyps    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   Pure hypercholesterolemia 08/18/2020   Shortness of breath 07/30/2007   Skin laxity 06/12/2017   TIA (transient ischemic attack) ?2005   Urticaria    Vitamin D  deficiency     Past Surgical History:  Procedure Laterality Date   CATARACT EXTRACTION W/ INTRAOCULAR LENS  IMPLANT, BILATERAL Bilateral    CESAREAN SECTION  1971; 1975   CORONARY PRESSURE/FFR STUDY N/A 04/01/2018   Procedure: INTRAVASCULAR PRESSURE WIRE/FFR STUDY;  Surgeon: Elmira Newman PARAS, MD;  Location: [facility];  Service: Cardiovascular;  Laterality: N/A;   DILATATION & CURRETTAGE/HYSTEROSCOPY WITH RESECTOCOPE N/A 10/16/2023   Procedure: DILATATION & CURETTAGE/HYSTEROSCOPY;  Surgeon: Horacio Boas, MD;  Location: [facility] OR;  Service: Gynecology;  Laterality: N/A;   LEFT HEART CATH AND CORONARY ANGIOGRAPHY N/A 04/01/2018   Procedure: LEFT HEART CATH AND CORONARY ANGIOGRAPHY;  Surgeon: Elmira Newman PARAS, MD;  Location: [facility];  Service: Cardiovascular;  Laterality: N/A;   MYOSURE RESECTION N/A 10/16/2023   Procedure: MELINDA RESECTION;  Surgeon: Horacio Boas, MD;  Location: [facility] OR;  Service: Gynecology;  Laterality: N/A;   TUBAL LIGATION  1975   TYMPANOSTOMY TUBE PLACEMENT Left 03/01/2021    Social History   Socioeconomic History   Marital status: Married    Spouse name: Milo   Number of children: 2   Years of education: 16   Highest education level: Bachelor's degree (e.g., BA, AB, BS)  Occupational History   Occupation: Retired  Tobacco Use   Smoking status: Never   Smokeless tobacco: Never  Vaping Use   Vaping status: Never Used  Substance and Sexual Activity   Alcohol  use: Never   Drug use: Never   Sexual  activity: Not Currently  Other Topics Concern   Not on file  Social History Narrative   Consumes no caffeine   Right handed   One story house    Social Drivers of Health   Financial Resource Strain: Low Risk  (03/31/2018)   Overall Financial Resource Strain (CARDIA)    Difficulty of Paying Living Expenses: Not hard at all  Food Insecurity: No Food Insecurity (03/31/2018)   Hunger Vital Sign    Worried About Running Out of Food in the Last Year: Never true    Ran Out of Food in the Last Year: Never true  Transportation Needs: No Transportation Needs (03/31/2018)   PRAPARE - Administrator, Civil Service (Medical): No    Lack of Transportation (Non-Medical): No  Physical Activity: Insufficiently Active (03/31/2018)   Exercise Vital Sign    Days of Exercise per Week: 1 day    Minutes of Exercise per Session: 30 min  Stress: No Stress Concern Present (03/31/2018)   [employer] of Occupational Health - Occupational Stress Questionnaire    Feeling of Stress : Not at all  Social Connections: Moderately Integrated (03/31/2018)   Social Connection and Isolation Panel  Frequency of Communication with Friends and Family: More than three times a week    Frequency of Social Gatherings with Friends and Family: More than three times a week    Attends Religious Services: 1 to 4 times per year    Active Member of [employer] or Organizations: No    Attends Banker Meetings: Never    Marital Status: Married  Catering Manager Violence: Not At Risk (03/31/2018)   Humiliation, Afraid, Rape, and Kick questionnaire    Fear of Current or Ex-Partner: No    Emotionally Abused: No    Physically Abused: No    Sexually Abused: No    Family History  Problem Relation Age of Onset   Diabetes Mellitus II Mother    Osteoporosis Mother    Hypertension Mother    Diabetes Mellitus II Father    CVA Sister    CAD Sister    Hypertension Sister    Osteoarthritis Sister    Breast  cancer Cousin 66   Dementia Cousin        suspected; never confirmed   Breast cancer Cousin    Alcoholism Brother    Epilepsy Brother    Allergic rhinitis Neg Hx    Angioedema Neg Hx    Asthma Neg Hx    Eczema Neg Hx    Urticaria Neg Hx     Allergies as of 04/14/2024 - Review Complete 04/05/2024  Allergen Reaction Noted   Amlodipine  besylate  08/01/2021   Clonidine  derivatives  10/16/2023   Hydralazine   11/19/2021   Losartan  potassium  08/01/2021   Minoxidil   10/16/2023   Naproxen Other (See Comments) 07/30/2007   Prednisone Other (See Comments) 10/19/2012   Amlodipine  Hives 02/04/2018   Isordil  [isosorbide  nitrate] Rash 10/29/2022   Losartan  Rash 02/09/2018    Current Outpatient Medications on File Prior to Visit  Medication Sig Dispense Refill   albuterol  (VENTOLIN  HFA) 108 (90 Base) MCG/ACT inhaler Inhale 2 puffs into the lungs every 4 (four) hours as needed for wheezing or shortness of breath (2 puffs every 4-6 hours as needed for cough/wheeze/shortness of breath/chest tightness.  May use 15-20 minutes prior to activity.). 6.7 g 1   aspirin  EC 81 MG tablet Take 1 tablet (81 mg total) by mouth daily. Swallow whole. 90 tablet 3   Calcium  Citrate-Vitamin D  315-250 MG-UNIT TABS Take 1 tablet by mouth daily.     Cholecalciferol  (VITAMIN D3) 10 MCG (400 UNIT) CAPS Take 400 Units by mouth daily.     Coenzyme Q10 (COQ10) 200 MG CAPS Take 200 mg by mouth daily.     cycloSPORINE  (RESTASIS ) 0.05 % ophthalmic emulsion Place 1 drop into both eyes 2 (two) times daily.      doxazosin  (CARDURA ) 1 MG tablet Take 1 mg by mouth daily.     fluticasone  (FLONASE ) 50 MCG/ACT nasal spray Place 1 spray into both nostrils in the morning and at bedtime.     fluticasone -salmeterol (WIXELA INHUB) 100-50 MCG/ACT AEPB Inhale 1 puff into the lungs 2 (two) times daily.     Magnesium  250 MG TABS Take 250 mg by mouth daily.     Multiple Vitamins-Minerals (CENTRUM SILVER  PO) Take 1 tablet by mouth daily.      Pitavastatin  Calcium  2 MG TABS TAKE 1 TABLET BY MOUTH EVERY DAY 90 tablet 7   pyridOXINE  (VITAMIN B-6) 100 MG tablet Take 100 mg by mouth daily.      spironolactone  (ALDACTONE ) 50 MG tablet Take 25 mg by mouth daily.  carboxymethylcellulose (REFRESH PLUS) 0.5 % SOLN Place 1 drop into both eyes in the morning and at bedtime.     nitroGLYCERIN  (NITROSTAT ) 0.4 MG SL tablet Place 0.4 mg under the tongue every 5 (five) minutes as needed for chest pain.     No current facility-administered medications on file prior to visit.   REVIEW OF SYSTEMS:  Review of Systems  Respiratory:  Negative for shortness of breath.   Cardiovascular:  Negative for chest pain and leg swelling.  Musculoskeletal:  Negative for myalgias.   PHYSICAL EXAMINATION:  Vitals:   04/14/24 1032  BP: 136/74  Pulse: 65  SpO2: 99%    There is no height or weight on file to calculate BMI.  Physical Exam Vitals reviewed.  Pulmonary:     Effort: Pulmonary effort is normal.  Musculoskeletal:        General: No tenderness.     Right lower leg: No edema.  Neurological:     Mental Status: She is alert.  Psychiatric:        Mood and Affect: Mood normal.    LABS:  CBC     Component Value Date/Time   WBC 5.6 04/05/2024 1248   RBC 4.07 04/05/2024 1248   HGB 12.2 04/05/2024 1248   HCT 37.1 04/05/2024 1248   PLT 187 04/05/2024 1248   MCV 91.2 04/05/2024 1248   MCH 30.0 04/05/2024 1248   MCHC 32.9 04/05/2024 1248   RDW 14.2 04/05/2024 1248   LYMPHSABS 1.1 04/05/2024 1248   MONOABS 0.6 04/05/2024 1248   EOSABS 0.1 04/05/2024 1248   BASOSABS 0.0 04/05/2024 1248    Hepatic Function      Component Value Date/Time   PROT 7.4 03/03/2024 1622   PROT 7.1 08/19/2019 1123   ALBUMIN 4.5 03/03/2024 1622   ALBUMIN 4.6 08/19/2019 1123   AST 32 03/03/2024 1622   ALT 25 03/03/2024 1622   ALKPHOS 67 03/03/2024 1622   BILITOT 1.0 03/03/2024 1622   BILITOT 0.6 08/19/2019 1123    Renal Function   Lab Results  Component  Value Date   CREATININE 0.79 04/05/2024   CREATININE 0.85 03/03/2024   CREATININE 0.84 10/18/2023    Estimated Creatinine Clearance: 43.8 mL/min (by C-G formula based on SCr of 0.79 mg/dL).   [facility] Vascular Lab Studies:  US  Venous Img Lower Unilateral Right (DVT): IMPRESSION: Occlusive superficial venous thrombosis in the lesser saphenous vein. Otherwise, negative for deep venous thrombosis in the right leg.  ASSESSMENT: Location of thrombus: right superficial vein  Patient without prior history of DVT diagnosed with occlusive superficial vein thrombosis in the right lesser saphenous vein. She has nearly completed 45 days of treatment with Eliquis  and has a couple days of medication left at home. She has tolerated Eliquis  well. She continues to have no swelling in her right lower extremity and the intermittent sharp pain she was experiencing when this was diagnosed has resolved. Discussed that tingling in her lower extremities is likely not related to the clot as she had these symptoms prior to superficial vein thrombosis diagnosis and is experiencing symptoms bilaterally. Discussed that she should follow up with her PCP/neurologist for this and she confirmed understanding. Will have her finish her current supply of Eliquis  then discontinue the medication as she will have completed 45 days of treatment. Counseled to let all future providers know of her history of superficial vein thrombosis. All patient questions were answered.  PLAN: -Patient is discharged from the DVT Clinic. -Discontinue anticoagulation with Eliquis  as  patient has completed 45 days of treatment for superficial vein thrombosis in the right lesser saphenous vein.  Follow up: No further follow up in DVT Clinic needed  Izetta Henry, PharmD [facility] [phone]

## 2024-04-14 ENCOUNTER — Ambulatory Visit: Attending: Vascular Surgery | Admitting: Pharmacist

## 2024-04-14 ENCOUNTER — Emergency Department (HOSPITAL_BASED_OUTPATIENT_CLINIC_OR_DEPARTMENT_OTHER)

## 2024-04-14 ENCOUNTER — Emergency Department (HOSPITAL_BASED_OUTPATIENT_CLINIC_OR_DEPARTMENT_OTHER)
Admission: EM | Admit: 2024-04-14 | Discharge: 2024-04-14 | Disposition: A | Discharge status: Home / Self Care | Attending: Emergency Medicine | Admitting: Emergency Medicine

## 2024-04-14 ENCOUNTER — Other Ambulatory Visit: Payer: Self-pay

## 2024-04-14 VITALS — BP 136/74 | HR 65

## 2024-04-14 DIAGNOSIS — M79662 Pain in left lower leg: Secondary | ICD-10-CM | POA: Diagnosis not present

## 2024-04-14 DIAGNOSIS — R519 Headache, unspecified: Secondary | ICD-10-CM | POA: Diagnosis not present

## 2024-04-14 DIAGNOSIS — Z7901 Long term (current) use of anticoagulants: Secondary | ICD-10-CM | POA: Insufficient documentation

## 2024-04-14 DIAGNOSIS — M79605 Pain in left leg: Secondary | ICD-10-CM | POA: Diagnosis not present

## 2024-04-14 DIAGNOSIS — I82890 Acute embolism and thrombosis of other specified veins: Secondary | ICD-10-CM | POA: Insufficient documentation

## 2024-04-14 DIAGNOSIS — M1712 Unilateral primary osteoarthritis, left knee: Secondary | ICD-10-CM | POA: Diagnosis not present

## 2024-04-14 DIAGNOSIS — I82409 Acute embolism and thrombosis of unspecified deep veins of unspecified lower extremity: Secondary | ICD-10-CM | POA: Diagnosis not present

## 2024-04-14 DIAGNOSIS — M79661 Pain in right lower leg: Secondary | ICD-10-CM | POA: Diagnosis present

## 2024-04-14 DIAGNOSIS — Z7982 Long term (current) use of aspirin: Secondary | ICD-10-CM | POA: Diagnosis not present

## 2024-04-14 DIAGNOSIS — M1710 Unilateral primary osteoarthritis, unspecified knee: Secondary | ICD-10-CM | POA: Diagnosis not present

## 2024-04-14 DIAGNOSIS — M19072 Primary osteoarthritis, left ankle and foot: Secondary | ICD-10-CM | POA: Diagnosis not present

## 2024-04-14 DIAGNOSIS — M79606 Pain in leg, unspecified: Secondary | ICD-10-CM | POA: Diagnosis not present

## 2024-04-14 LAB — CBC WITH DIFFERENTIAL/PLATELET
Abs Immature Granulocytes: 0.01 K/uL (ref 0.00–0.07)
Basophils Absolute: 0 K/uL (ref 0.0–0.1)
Basophils Relative: 1 %
Eosinophils Absolute: 0.1 K/uL (ref 0.0–0.5)
Eosinophils Relative: 1 %
HCT: 39.4 % (ref 36.0–46.0)
Hemoglobin: 12.8 g/dL (ref 12.0–15.0)
Immature Granulocytes: 0 %
Lymphocytes Relative: 21 %
Lymphs Abs: 0.8 K/uL (ref 0.7–4.0)
MCH: 29.6 pg (ref 26.0–34.0)
MCHC: 32.5 g/dL (ref 30.0–36.0)
MCV: 91 fL (ref 80.0–100.0)
Monocytes Absolute: 0.6 K/uL (ref 0.1–1.0)
Monocytes Relative: 14 %
Neutro Abs: 2.5 K/uL (ref 1.7–7.7)
Neutrophils Relative %: 63 %
Platelets: 214 K/uL (ref 150–400)
RBC: 4.33 MIL/uL (ref 3.87–5.11)
RDW: 13.7 % (ref 11.5–15.5)
WBC: 4 K/uL (ref 4.0–10.5)
nRBC: 0 % (ref 0.0–0.2)

## 2024-04-14 LAB — BASIC METABOLIC PANEL WITH GFR
Anion gap: 10 (ref 5–15)
BUN: 18 mg/dL (ref 8–23)
CO2: 27 mmol/L (ref 22–32)
Calcium: 10.2 mg/dL (ref 8.9–10.3)
Chloride: 103 mmol/L (ref 98–111)
Creatinine, Ser: 0.67 mg/dL (ref 0.44–1.00)
GFR, Estimated: >60 mL/min (ref >60)
Glucose, Bld: 108 mg/dL — ABNORMAL HIGH (ref 70–99)
Potassium: 4 mmol/L (ref 3.5–5.1)
Sodium: 139 mmol/L (ref 135–145)

## 2024-04-14 LAB — MAGNESIUM: Magnesium: 2.2 mg/dL (ref 1.7–2.4)

## 2024-04-14 LAB — CK: Total CK: 140 U/L (ref 38–234)

## 2024-04-14 NOTE — ED Notes (Signed)
 Pt d/c instructions, medications, and follow-up care reviewed with pt. Pt verbalized understanding and had no further questions at time of d/c. Pt CA&Ox4, ambulatory, and in NAD at time of d/c

## 2024-04-14 NOTE — ED Notes (Signed)
 Pt reports she would like her IV removed and that she has to leave because her husband cannot see to drive in the dark. Pt very anxious about this and would like to leave and have the results of her ultrasound be sent to after leaving/ EDP Barrett notified and aware. EDP bedside to speak with patient.

## 2024-04-14 NOTE — ED Provider Notes (Signed)
 [place] [facility]GENCY DEPARTMENT AT [facility] Provider Note   CSN: [id number] Arrival date & time: 04/14/24  1337     Patient presents with: Leg Pain   Sara Brown is a 83 y.o. female.  42 days ago dx w/ superficial dvt right leg and started on Eliquis , has been taking as prescribed, will finish Eliquis  is 3 days. Was seen at DVT clinic today and cleared to stopped eliquis  as swelling and symptoms improved. Now presenting hours later with bilateral leg pain. She tells me this has been going on for a few weeks, she reports it is worse with walking around, located to bilateral calves and describes it as a moving sensation she denies, pain, numbness, tingling, rash, fever. She can ambulate with steady gait. She has not missed Eliquis  dosing. No cancer hx.    Leg Pain      Prior to Admission medications   Medication Sig Start Date End Date Taking? Authorizing Provider  albuterol  (VENTOLIN  HFA) 108 (90 Base) MCG/ACT inhaler Inhale 2 puffs into the lungs every 4 (four) hours as needed for wheezing or shortness of breath (2 puffs every 4-6 hours as needed for cough/wheeze/shortness of breath/chest tightness.  May use 15-20 minutes prior to activity.). 04/17/23   Jeneal Danita Macintosh, MD  aspirin  EC 81 MG tablet Take 1 tablet (81 mg total) by mouth daily. Swallow whole. 01/07/22   Patwardhan, Newman PARAS, MD  Calcium  Citrate-Vitamin D  315-250 MG-UNIT TABS Take 1 tablet by mouth daily.    [provider]  carboxymethylcellulose (REFRESH PLUS) 0.5 % SOLN Place 1 drop into both eyes in the morning and at bedtime.    [provider]  Cholecalciferol  (VITAMIN D3) 10 MCG (400 UNIT) CAPS Take 400 Units by mouth daily.    [provider]  Coenzyme Q10 (COQ10) 200 MG CAPS Take 200 mg by mouth daily.    [provider]  cycloSPORINE  (RESTASIS ) 0.05 % ophthalmic emulsion Place 1 drop into both eyes 2 (two) times daily.     [provider]   doxazosin  (CARDURA ) 1 MG tablet Take 1 mg by mouth daily.    [provider]  fluticasone  (FLONASE ) 50 MCG/ACT nasal spray Place 1 spray into both nostrils in the morning and at bedtime.    [provider]  fluticasone -salmeterol (WIXELA INHUB) 100-50 MCG/ACT AEPB Inhale 1 puff into the lungs 2 (two) times daily.    [provider]  Magnesium  250 MG TABS Take 250 mg by mouth daily.    [provider]  Multiple Vitamins-Minerals (CENTRUM SILVER  PO) Take 1 tablet by mouth daily.    [provider]  nitroGLYCERIN  (NITROSTAT ) 0.4 MG SL tablet Place 0.4 mg under the tongue every 5 (five) minutes as needed for chest pain.    [provider]  Pitavastatin  Calcium  2 MG TABS TAKE 1 TABLET BY MOUTH EVERY DAY 05/22/23   Patwardhan, Manish J, MD  pyridOXINE  (VITAMIN B-6) 100 MG tablet Take 100 mg by mouth daily.     [provider]  spironolactone  (ALDACTONE ) 50 MG tablet Take 25 mg by mouth daily.    [provider]    Allergies: Amlodipine  besylate, Clonidine  derivatives, Hydralazine , Losartan  potassium, Minoxidil , Naproxen, Prednisone, Amlodipine , Isordil  [isosorbide  nitrate], and Losartan     Review of Systems  Musculoskeletal:  Positive for arthralgias.    Updated Vital Signs BP (!) 159/83 (BP Location: Right Arm)   Pulse 74   Temp 98.7 F (37.1 C) (Oral)   Resp  17   SpO2 95%   Physical Exam Vitals and nursing note reviewed.  Constitutional:      General: She is not in acute distress.    Appearance: She is not toxic-appearing.  HENT:     Head: Normocephalic and atraumatic.  Eyes:     General: No scleral icterus.    Conjunctiva/sclera: Conjunctivae normal.  Cardiovascular:     Rate and Rhythm: Normal rate and regular rhythm.     Pulses: Normal pulses.     Heart sounds: Normal heart sounds.  Pulmonary:     Effort: Pulmonary effort is normal. No respiratory distress.     Breath sounds: Normal breath sounds.   Abdominal:     General: Abdomen is flat. Bowel sounds are normal.     Palpations: Abdomen is soft.     Tenderness: There is no abdominal tenderness.  Musculoskeletal:     Comments: Strong dorsal pedal pulse equal bilaterally, cap refill under 2 seconds.  No rash or wound.  Strength of lower extremity and sensation of lower extremity intact.  Skin:    General: Skin is warm and dry.     Findings: No lesion.  Neurological:     General: No focal deficit present.     Mental Status: She is alert and oriented to person, place, and time. Mental status is at baseline.     (all labs ordered are listed, but only abnormal results are displayed) Labs Reviewed  BASIC METABOLIC PANEL WITH GFR - Abnormal; Notable for the following components:      Result Value   Glucose, Bld 108 (*)    All other components within normal limits  CBC WITH DIFFERENTIAL/PLATELET  MAGNESIUM   CK    EKG: None  Radiology: DG Tibia/Fibula Right Result Date: 04/14/2024 EXAM: _VIEWS_ VIEW(S) XRAY OF THE _LATERALITY_ TIBIA AND FIBULA 04/14/2024 03:01:13 PM COMPARISON: None available. CLINICAL HISTORY: leg pain FINDINGS: BONES AND JOINTS: No acute fracture. No focal osseous lesion. No joint dislocation. Mild degenerative changes of the knee. SOFT TISSUES: The soft tissues are unremarkable. IMPRESSION: 1. No acute findings. Electronically signed by: Waddell Calk MD 04/14/2024 03:35 PM EST RP Workstation: [id number]   DG Tibia/Fibula Left Result Date: 04/14/2024 EXAM: _VIEWS_ VIEW(S) XRAY OF THE LEFT TIBIA AND FIBULA 04/14/2024 03:01:13 PM COMPARISON: None available. CLINICAL HISTORY: leg pain FINDINGS: BONES AND JOINTS: No acute fracture. No focal osseous lesion. No joint dislocation. Tricompartmental degenerative knee joint narrowing and osteophytes. Mild ankle osteoarthritis. SOFT TISSUES: The soft tissues are unremarkable. IMPRESSION: 1. Tricompartmental degenerative knee osteoarthritis. 2. No acute findings.  Electronically signed by: Waddell Calk MD 04/14/2024 03:35 PM EST RP Workstation: [id number]     Procedures   Medications Ordered in the ED - No data to display  Clinical Course as of 04/14/24 1701  Wed Apr 14, 2024  1656 Patient tells me that she cannot drive in the dark and she is requesting to be discharged immediately.  She does not want to wait for results.  At this point we are still waiting for bilateral DVT study to come back.  She is requesting that she get a phone call with results.  She does not have any chest pain, shortness of breath, hypoxia or concern for PE at this time. I will give her a call with abnormal results, but she understands she may need to return if imaging changes treatment plan.  [JB]    Clinical Course User Index [JB] Collyns Mcquigg, Warren SAILOR, PA-C  Medical Decision Making Amount and/or Complexity of Data Reviewed Labs: ordered. Radiology: ordered.   This patient presents to the ED for concern of arthralgia, this involves an extensive number of treatment options, and is a complaint that carries with it a high risk of complications and morbidity.  The differential diagnosis includes electrolyte abnormality, dehydration, DVT, CHF exacerbation, venous insufficiency, cellulitis, peripheral arterial disease, rhabdomyolysis   Lab Tests:  I personally interpreted labs.  The pertinent results include: Check CBC, BMP, mag and CK unreamarkable   Imaging Studies ordered:  I ordered imaging studies including x-ray of tib-fib bilaterally to rule out fracture/mass/fx and repeat DVT study  I independently visualized and interpreted imaging which showed no acute findings on x-ray. DVT study pending at time of discharge. Patient does not want to wait, she understands she may need to return if these results are positive as she is already on Eliquis  for DVT treatment/prophylaxis. She expresses understanding and still wants to leave. She is seems  capable of making her own medical decisions. I agree with the radiologist interpretation   Cardiac Monitoring: / EKG:  The patient was maintained on a cardiac monitor.     Problem List / ED Course / Critical interventions / Medication management  Patient reports with bilateral calf pain that has been ongoing for several weeks.  This seems to be about an aching or cramping sensation when walking however she only describes as moving sensation.  On my exam she has no obvious swelling or edema. No sign of trauma, injury, no rash or cellulitis.  She is currently asymptomatic.  She is strong dorsal pedal pulse and good cap refill.  Will obtain CK and electrolytes due to reported calf cramping.  Will obtain DVT study to definitively rule blood clot is resolved.  And will obtain x-rays to rule out fracture or mass or other findings.  She is hemodynamically stable and well-appearing.  She denies any specific injury trauma or fall.  She denies any chest pain shortness of breath or cough.  Ultimately, with otherwise reassuring workup feel stable for discharge with close outpatient follow-up. Hemodynamically stable and well-appearing throughout duration of visit in ER. DVT study not resulted at time of discharge, patient does not want to wait any longer. She was given follow-up and return precautions.         Final diagnoses:  Bilateral calf pain    ED Discharge Orders     None          Shermon Warren SAILOR, PA-C 04/14/24 1704    Kingsley, Victoria K, DO 04/15/24 [phone]

## 2024-04-14 NOTE — ED Triage Notes (Signed)
 Pt caox4, ambulatory, c/o bilat leg tingling and swelling that has been ongoing however pt states it worsened yesterday. Was taking eliquis  for DVT but states she just got cleared.

## 2024-04-14 NOTE — Discharge Instructions (Addendum)
 Follow-up with primary care doctor for further evaluation of symptoms. You can also called orthopedics.  I recommend compression stockings, ice, elevation and Tylneol for pain control. Return to ER with new or worsening symptoms.    Please monitor results of DVT study on MyChart.  If there are concerning results on DVT study I will call you and may leave a message if you need to return to ER.

## 2024-04-14 NOTE — Patient Instructions (Signed)
-   Finish your current supply of Eliquis  then discontinue the medication

## 2024-04-19 DIAGNOSIS — R399 Unspecified symptoms and signs involving the genitourinary system: Secondary | ICD-10-CM | POA: Diagnosis not present

## 2024-04-20 DIAGNOSIS — M79661 Pain in right lower leg: Secondary | ICD-10-CM | POA: Diagnosis not present

## 2024-04-20 DIAGNOSIS — I10 Essential (primary) hypertension: Secondary | ICD-10-CM | POA: Diagnosis not present

## 2024-04-20 DIAGNOSIS — M79662 Pain in left lower leg: Secondary | ICD-10-CM | POA: Diagnosis not present

## 2024-04-20 DIAGNOSIS — Z23 Encounter for immunization: Secondary | ICD-10-CM | POA: Diagnosis not present

## 2024-04-20 DIAGNOSIS — H35033 Hypertensive retinopathy, bilateral: Secondary | ICD-10-CM | POA: Diagnosis not present

## 2024-04-20 DIAGNOSIS — I700 Atherosclerosis of aorta: Secondary | ICD-10-CM | POA: Diagnosis not present

## 2024-04-20 DIAGNOSIS — M170 Bilateral primary osteoarthritis of knee: Secondary | ICD-10-CM | POA: Diagnosis not present

## 2024-04-20 DIAGNOSIS — E1169 Type 2 diabetes mellitus with other specified complication: Secondary | ICD-10-CM | POA: Diagnosis not present

## 2024-04-20 DIAGNOSIS — M1712 Unilateral primary osteoarthritis, left knee: Secondary | ICD-10-CM | POA: Diagnosis not present

## 2024-04-22 ENCOUNTER — Encounter: Payer: Self-pay | Admitting: Neurology

## 2024-04-22 ENCOUNTER — Other Ambulatory Visit: Payer: Self-pay | Admitting: Cardiology

## 2024-04-22 DIAGNOSIS — M1712 Unilateral primary osteoarthritis, left knee: Secondary | ICD-10-CM | POA: Diagnosis not present

## 2024-04-22 DIAGNOSIS — M170 Bilateral primary osteoarthritis of knee: Secondary | ICD-10-CM | POA: Diagnosis not present

## 2024-04-22 DIAGNOSIS — M79604 Pain in right leg: Secondary | ICD-10-CM | POA: Diagnosis not present

## 2024-04-22 DIAGNOSIS — M79662 Pain in left lower leg: Secondary | ICD-10-CM | POA: Diagnosis not present

## 2024-04-22 DIAGNOSIS — M79661 Pain in right lower leg: Secondary | ICD-10-CM | POA: Diagnosis not present

## 2024-04-26 DIAGNOSIS — H6123 Impacted cerumen, bilateral: Secondary | ICD-10-CM | POA: Diagnosis not present

## 2024-04-26 DIAGNOSIS — H6522 Chronic serous otitis media, left ear: Secondary | ICD-10-CM | POA: Diagnosis not present

## 2024-04-27 DIAGNOSIS — M79661 Pain in right lower leg: Secondary | ICD-10-CM | POA: Diagnosis not present

## 2024-04-27 DIAGNOSIS — M79662 Pain in left lower leg: Secondary | ICD-10-CM | POA: Diagnosis not present

## 2024-04-27 DIAGNOSIS — M170 Bilateral primary osteoarthritis of knee: Secondary | ICD-10-CM | POA: Diagnosis not present

## 2024-04-27 DIAGNOSIS — M1712 Unilateral primary osteoarthritis, left knee: Secondary | ICD-10-CM | POA: Diagnosis not present

## 2024-05-03 DIAGNOSIS — M79662 Pain in left lower leg: Secondary | ICD-10-CM | POA: Diagnosis not present

## 2024-05-03 DIAGNOSIS — M1711 Unilateral primary osteoarthritis, right knee: Secondary | ICD-10-CM | POA: Diagnosis not present

## 2024-05-03 DIAGNOSIS — M79661 Pain in right lower leg: Secondary | ICD-10-CM | POA: Diagnosis not present

## 2024-05-03 DIAGNOSIS — M1712 Unilateral primary osteoarthritis, left knee: Secondary | ICD-10-CM | POA: Diagnosis not present

## 2024-05-05 ENCOUNTER — Ambulatory Visit: Admitting: Podiatry

## 2024-05-10 DIAGNOSIS — M79661 Pain in right lower leg: Secondary | ICD-10-CM | POA: Diagnosis not present

## 2024-05-10 DIAGNOSIS — M79662 Pain in left lower leg: Secondary | ICD-10-CM | POA: Diagnosis not present

## 2024-05-10 DIAGNOSIS — M1712 Unilateral primary osteoarthritis, left knee: Secondary | ICD-10-CM | POA: Diagnosis not present

## 2024-05-10 DIAGNOSIS — M1711 Unilateral primary osteoarthritis, right knee: Secondary | ICD-10-CM | POA: Diagnosis not present

## 2024-05-10 NOTE — Progress Notes (Unsigned)
 Patient ID: Sara Brown                 DOB: [birth year]                      MRN: [id number]      HPI: Evangelene Vora is a 83 y.o. female referred by Dr. Elmira  to HTN clinic. PMH is significant for  hypertension, diet controlled diabetes, hyperlipidemia, COPD, GERD and remote history of TIA, mild nonobstructive coronary artery disease.   Patient has intolerance to multiple BP meds -  losartan , amlodipine , hydralazine , atenolol, nifedipine , clonidine , reluctant to use carvedilol  and labetalol  due to history of asthma patient saw me Dec 2024 her BP monitor was validated - found to be accurate. Patient saw Orren Fabry, PA-C on 03/16/2024 in office BP was elevated 140/68.  Patient had requested follow up with me. Today patient presented with home BP log home readings ~ 110-120/68 heart rate 65. Reports she feels great and happy with her BP. She has been taking 1/2 tab of spironolactone  50 mg and doxazosin  1 mg 2 hours after taking spironolactone . Reports no dizziness, SOB or swelling. She was rushing to come this appointment that is why her BP is slightly higher than her home readings. Follows low salt diet and go to PT sessions 2 times per week     Current HTN meds: doxazosin  1 mg daily, spironolactone  25 mg daily  Previously tried:  losartan , amlodipine , hydralazine , atenolol, nifedipine , clonidine , reluctant to use carvedilol  and labetalol  due to history of asthma,clonidine  was stopped due to complaints of hair loss and minoxidil  was started instead, but she does not want to take it due to concern of having heart disease BP goal:130/80    Family History:  Mother- hypertension Father T2DM Sister- died from MI in her 4's  Other sisters: hypertension Social History:  Alcohol : none  Smoking: none  Diet: does not eat out and follows low salt diet   Exercise: go to PT 2 times per week    Home BP readings: ~ 110-120/68 heart rate 65.    Wt Readings from Last 3  Encounters:  04/05/24 119 lb (54 kg)  03/16/24 124 lb (56.2 kg)  03/04/24 117 lb 1.6 oz (53.1 kg)   BP Readings from Last 3 Encounters:  05/11/24 130/73  04/14/24 139/82  04/14/24 136/74   Pulse Readings from Last 3 Encounters:  05/11/24 69  04/14/24 67  04/14/24 65    Renal function: CrCl cannot be calculated (Patient's most recent lab result is older than the maximum 21 days allowed.).  Past Medical History:  Diagnosis Date   Age-related osteoporosis without current pathological fracture 08/18/2020   Allergic rhinitis    Angina at rest 02/09/2018   Arthritis    fingers, knees (02/09/2018)   Asthma    stopped taking RX after dr said I don't have this (02/09/2018)   Basal cell carcinoma    Bradycardia 02/09/2018   Carotid stenosis    ICA(L)   Chest pain due to coronary artery disease 02/09/2018   Chronic bronchitis (HCC)    Chronic constipation 08/18/2020   Complication of anesthesia    felt the cut w/one of my c-sections (02/09/2018)   Coronary artery disease involving native coronary artery of native heart without angina pectoris 09/17/2018   Diabetes mellitus type 2, controlled 03/31/2018   Eczema    EEG abnormal    Essential hypertension 03/31/2018   Generalized anxiety disorder  GERD (gastroesophageal reflux disease)    Hardening of the aorta (main artery of the heart)    Headache    years ago; before menopause (02/09/2018)   Hirsutism 06/12/2017   Hyperglycemia due to type 2 diabetes mellitus 03/09/2021   Hyperlipidemia    Hyperpigmentation 02/13/2017   Hypertensive retinopathy    Irritable bowel syndrome    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   Mixed hyperlipidemia 12/29/2020   Myringotomy tube status 04/03/2021   Neck mass 01/17/2017   Neuropathic pain 03/09/2021   OA (osteoarthritis)    Osteoporosis    Palpitations 03/16/2021    Paresthesias 09/05/2020   Personal history of colonic polyps    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 Swallow whole. 90 tablet 3   Calcium  Citrate-Vitamin D  315-250 MG-UNIT TABS Take 1 tablet by mouth daily.     carboxymethylcellulose (REFRESH PLUS) 0.5 % SOLN Place 1 drop into both eyes in the morning and at bedtime.     Cholecalciferol  (VITAMIN D3) 10 MCG (400 UNIT) CAPS Take 400 Units by mouth daily.     Coenzyme Q10 (COQ10) 200 MG CAPS Take 200 mg by mouth daily.     cycloSPORINE  (RESTASIS ) 0.05 % ophthalmic emulsion Place 1 drop into both eyes 2 (two) times daily.      doxazosin  (CARDURA ) 1 MG tablet TAKE 1 TABLET BY MOUTH EVERY DAY 90 tablet 3   fluticasone  (FLONASE ) 50 MCG/ACT nasal spray Place 1 spray into both nostrils in the morning and at bedtime.     fluticasone -salmeterol (WIXELA INHUB) 100-50 MCG/ACT AEPB Inhale 1 puff into the lungs 2 (two) times daily.     Magnesium  250 MG TABS Take 250 mg by mouth daily.     Multiple Vitamins-Minerals (CENTRUM SILVER  PO) Take 1 tablet by mouth daily.     nitroGLYCERIN  (NITROSTAT ) 0.4 MG SL tablet Place 0.4 mg under the tongue every 5 (five) minutes as needed for chest pain.     Pitavastatin  Calcium  2 MG TABS TAKE 1 TABLET BY MOUTH EVERY DAY 90 tablet 7   pyridOXINE  (VITAMIN B-6) 100 MG tablet Take 100 mg by mouth daily.      spironolactone  (ALDACTONE ) 50 MG tablet Take 25 mg by mouth daily.     albuterol  (VENTOLIN  HFA) 108 (90 Base) MCG/ACT inhaler Inhale 2 puffs into the lungs every 4 (four) hours as needed for wheezing or shortness of breath (2 puffs every 4-6 hours as needed for cough/wheeze/shortness of  breath/chest tightness.  May use 15-20 minutes prior to activity.). 6.7 g 1   No current facility-administered medications on file prior to visit.    Allergies  Allergen Reactions   Amlodipine  Besylate     Other reaction(s): rash and hives   Clonidine  Derivatives     Darkened skin/hair loss   Hydralazine      Other reaction(s): rash   Losartan  Potassium     Other reaction(s): rash and HIves   Minoxidil      Hair growth on chin   Naproxen Other (See Comments)  Patient preference  Other reaction(s): upset stomach   Prednisone Other (See Comments)    Patient Preference  Other reaction(s): upset stomach   Amlodipine  Hives   Isordil  [Isosorbide  Nitrate] Rash   Losartan  Rash    Blood pressure 130/73, pulse 69, SpO2 98%.   Assessment/Plan:  1. Hypertension -  Problem  Essential Hypertension   Current HTN meds: spironolactone  25 mg daily in the morning, doxazosin  1 mg once a day in the afternoon  Previously tried: losartan , amlodipine , hydralazine , atenolol, nifedipine , clonidine , reluctant to use carvedilol  and labetalol  due to history of asthma,clonidine  was stopped due to complaints of hair loss and minoxidil  was started instead, but she does not want to take it due to concern of having heart disease.   BP goal: <130/80 given advance age and allergic to multiple BP medication can consider lenient goal <140/90     Essential hypertension Assessment and plan:  In office BP 130/73 heart rate 69 and home BP ~ 110-120/68 heart rate 65. Denies dizziness, headaches, SOB, palpitation or chest pain  Given at goal BP no changes to the current meds (spironolactone  25 mg daily in the morning, doxazosin  1 mg once a day in the afternoon )which patient tolerates it well  Denies dizziness, headaches, SOB, palpitation or chest pain  Follow up with MD in 6 months       Thank you  Robbi Blanch, Pharm.D [place] [facility]. [facility] [address]  [address], [place], [place] [address] Phone: [phone]; Fax: [phone]

## 2024-05-11 ENCOUNTER — Ambulatory Visit: Attending: Cardiology | Admitting: Pharmacist

## 2024-05-11 ENCOUNTER — Encounter: Payer: Self-pay | Admitting: Pharmacist

## 2024-05-11 VITALS — BP 130/73 | HR 69

## 2024-05-11 DIAGNOSIS — I10 Essential (primary) hypertension: Secondary | ICD-10-CM | POA: Insufficient documentation

## 2024-05-11 NOTE — Assessment & Plan Note (Signed)
 Assessment and plan:  In office BP 130/73 heart rate 69 and home BP ~ 110-120/68 heart rate 65. Denies dizziness, headaches, SOB, palpitation or chest pain  Given at goal BP no changes to the current meds (spironolactone  25 mg daily in the morning, doxazosin  1 mg once a day in the afternoon )which patient tolerates it well  Denies dizziness, headaches, SOB, palpitation or chest pain  Follow up with MD in 6 months

## 2024-05-13 DIAGNOSIS — M79661 Pain in right lower leg: Secondary | ICD-10-CM | POA: Diagnosis not present

## 2024-05-13 DIAGNOSIS — M170 Bilateral primary osteoarthritis of knee: Secondary | ICD-10-CM | POA: Diagnosis not present

## 2024-05-13 DIAGNOSIS — M79662 Pain in left lower leg: Secondary | ICD-10-CM | POA: Diagnosis not present

## 2024-05-13 DIAGNOSIS — M1712 Unilateral primary osteoarthritis, left knee: Secondary | ICD-10-CM | POA: Diagnosis not present

## 2024-05-19 DIAGNOSIS — M79661 Pain in right lower leg: Secondary | ICD-10-CM | POA: Diagnosis not present

## 2024-05-19 DIAGNOSIS — M1712 Unilateral primary osteoarthritis, left knee: Secondary | ICD-10-CM | POA: Diagnosis not present

## 2024-05-19 DIAGNOSIS — M170 Bilateral primary osteoarthritis of knee: Secondary | ICD-10-CM | POA: Diagnosis not present

## 2024-05-19 DIAGNOSIS — M79662 Pain in left lower leg: Secondary | ICD-10-CM | POA: Diagnosis not present

## 2024-05-20 DIAGNOSIS — M79662 Pain in left lower leg: Secondary | ICD-10-CM | POA: Diagnosis not present

## 2024-05-20 DIAGNOSIS — M1712 Unilateral primary osteoarthritis, left knee: Secondary | ICD-10-CM | POA: Diagnosis not present

## 2024-05-20 DIAGNOSIS — M170 Bilateral primary osteoarthritis of knee: Secondary | ICD-10-CM | POA: Diagnosis not present

## 2024-05-20 DIAGNOSIS — M79661 Pain in right lower leg: Secondary | ICD-10-CM | POA: Diagnosis not present

## 2024-05-21 ENCOUNTER — Ambulatory Visit (INDEPENDENT_AMBULATORY_CARE_PROVIDER_SITE_OTHER): Admitting: Podiatry

## 2024-05-21 DIAGNOSIS — B351 Tinea unguium: Secondary | ICD-10-CM | POA: Diagnosis not present

## 2024-05-21 DIAGNOSIS — E1142 Type 2 diabetes mellitus with diabetic polyneuropathy: Secondary | ICD-10-CM | POA: Diagnosis not present

## 2024-05-21 DIAGNOSIS — L84 Corns and callosities: Secondary | ICD-10-CM

## 2024-05-21 DIAGNOSIS — M79674 Pain in right toe(s): Secondary | ICD-10-CM | POA: Diagnosis not present

## 2024-05-21 DIAGNOSIS — M79675 Pain in left toe(s): Secondary | ICD-10-CM | POA: Diagnosis not present

## 2024-05-21 DIAGNOSIS — E119 Type 2 diabetes mellitus without complications: Secondary | ICD-10-CM | POA: Diagnosis not present

## 2024-05-21 NOTE — Progress Notes (Signed)
This patient returns to my office for at risk foot care.  This patient requires this care by a professional since this patient will be at risk due to having diabetic neuropathy.  This patient is unable to cut nails herself since the patient cannot reach her nails.These nails are painful walking and wearing shoes.  This patient presents for at risk foot care today.  General Appearance  Alert, conversant and in no acute stress.  Vascular  Dorsalis pedis and posterior tibial  pulses are palpable  bilaterally.  Capillary return is within normal limits  bilaterally. Temperature is within normal limits  bilaterally.  Neurologic  Senn-Weinstein monofilament wire test within normal limits  bilaterally. Muscle power within normal limits bilaterally.  Nails Thick disfigured discolored nails with subungual debris  from hallux to fifth toes bilaterally. No evidence of bacterial infection or drainage bilaterally.  Orthopedic  No limitations of motion  feet .  No crepitus or effusions noted.  No bony pathology or digital deformities noted.  Skin  normotropic skin with no porokeratosis noted bilaterally.  No signs of infections or ulcers noted.     Onychomycosis  Pain in right toes  Pain in left toes  Consent was obtained for treatment procedures.   Mechanical debridement of nails 1-5  bilaterally performed with a nail nipper.  Filed with dremel without incident.     Return office visit   3 months                   Told patient to return for periodic foot care and evaluation due to potential at risk complications.   Collen Vincent DPM   

## 2024-05-24 DIAGNOSIS — M79662 Pain in left lower leg: Secondary | ICD-10-CM | POA: Diagnosis not present

## 2024-05-24 DIAGNOSIS — M1712 Unilateral primary osteoarthritis, left knee: Secondary | ICD-10-CM | POA: Diagnosis not present

## 2024-05-24 DIAGNOSIS — M79661 Pain in right lower leg: Secondary | ICD-10-CM | POA: Diagnosis not present

## 2024-05-24 DIAGNOSIS — M1711 Unilateral primary osteoarthritis, right knee: Secondary | ICD-10-CM | POA: Diagnosis not present

## 2024-06-30 ENCOUNTER — Ambulatory Visit: Admitting: Podiatry

## 2024-06-30 ENCOUNTER — Encounter: Payer: Self-pay | Admitting: Podiatry

## 2024-06-30 DIAGNOSIS — B351 Tinea unguium: Secondary | ICD-10-CM

## 2024-06-30 DIAGNOSIS — E119 Type 2 diabetes mellitus without complications: Secondary | ICD-10-CM | POA: Diagnosis not present

## 2024-06-30 DIAGNOSIS — M79675 Pain in left toe(s): Secondary | ICD-10-CM | POA: Diagnosis not present

## 2024-06-30 DIAGNOSIS — Z0189 Encounter for other specified special examinations: Secondary | ICD-10-CM

## 2024-06-30 DIAGNOSIS — M79674 Pain in right toe(s): Secondary | ICD-10-CM | POA: Diagnosis not present

## 2024-06-30 DIAGNOSIS — E1142 Type 2 diabetes mellitus with diabetic polyneuropathy: Secondary | ICD-10-CM | POA: Diagnosis not present

## 2024-06-30 NOTE — Progress Notes (Signed)
 "  Subjective:  Patient ID: Sara Brown, female    DOB: 1940/11/11,   MRN: [id number]  Chief Complaint  Patient presents with   Diabetes    84 y.o. female presents for concern of thickened elongated and painful nails that are difficult to trim. Requesting to have them trimmed today. Relates burning and tingling in their feet. Patient is diabetic and last A1c was  Lab Results  Component Value Date   HGBA1C 5.5 10/16/2023   .   PCP:  Rexanne Ingle, MD    . Denies any other pedal complaints. Denies n/v/f/c.   Past Medical History:  Diagnosis Date   Age-related osteoporosis without current pathological fracture 08/18/2020   Allergic rhinitis    Angina at rest 02/09/2018   Arthritis    fingers, knees (02/09/2018)   Asthma    stopped taking RX after dr said I don't have this (02/09/2018)   Basal cell carcinoma    Bradycardia 02/09/2018   Carotid stenosis    ICA(L)   Chest pain due to coronary artery disease 02/09/2018   Chronic bronchitis (HCC)    Chronic constipation 08/18/2020   Complication of anesthesia    felt the cut w/one of my c-sections (02/09/2018)   Coronary artery disease involving native coronary artery of native heart without angina pectoris 09/17/2018   Diabetes mellitus type 2, controlled 03/31/2018   Eczema    EEG abnormal    Essential hypertension 03/31/2018   Generalized anxiety disorder    GERD (gastroesophageal reflux disease)    Hardening of the aorta (main artery of the heart)    Headache    years ago; before menopause (02/09/2018)   Hirsutism 06/12/2017   Hyperglycemia due to type 2 diabetes mellitus 03/09/2021   Hyperlipidemia    Hyperpigmentation 02/13/2017   Hypertensive retinopathy    Irritable bowel syndrome    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   Mixed hyperlipidemia 12/29/2020   Myringotomy tube status  04/03/2021   Neck mass 01/17/2017   Neuropathic pain 03/09/2021   OA (osteoarthritis)    Osteoporosis    Palpitations 03/16/2021   Paresthesias 09/05/2020   Personal history of colonic polyps    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 CFT <3 seconds. Absent hair growth on digits. Edema noted to bilateral lower extremities. Xerosis noted bilaterally.  Skin. No lacerations or abrasions bilateral feet. Nails 1-5 bilateral  are thickened discolored and elongated with subungual debris. Hyperkeratotic tissue noted to sub metatarsal first head on right and sub third metatarsal head on left improved.  Musculoskeletal: MMT 5/5 bilateral lower extremities in DF, PF, Inversion and Eversion. Deceased ROM in DF of ankle joint.  Neurological: Sensation intact to light touch. Protective sensation diminished bilateral.    Assessment:   1. Pain due to onychomycosis of toenails of both feet   2. Diabetic peripheral neuropathy associated with type 2 diabetes mellitus (HCC)      Plan:  Patient was evaluated and treated and all questions answered. -Discussed and educated patient on diabetic foot care, especially with  regards to the vascular, neurological and musculoskeletal systems.  -Mechanically debrided all nails 1-5 bilateral  using sterile nail nipper without incident  -Answered all patient questions -Patient to return  in 3 months   for at risk foot care -Patient advised to call the office if any problems or questions arise in the meantime.   Asberry Failing, DPM    "

## 2024-08-11 ENCOUNTER — Ambulatory Visit: Admitting: Neurology

## 2024-08-19 ENCOUNTER — Ambulatory Visit: Admitting: Podiatry
